# Patient Record
Sex: Male | Born: 1945
Health system: Southern US, Community
[De-identification: ages and names within clinical notes are randomized; demographics above are authoritative.]

## PROBLEM LIST (undated history)

## (undated) DIAGNOSIS — E43 Unspecified severe protein-calorie malnutrition: Secondary | ICD-10-CM

## (undated) DIAGNOSIS — E785 Hyperlipidemia, unspecified: Secondary | ICD-10-CM

## (undated) DIAGNOSIS — Z79899 Other long term (current) drug therapy: Secondary | ICD-10-CM

## (undated) DIAGNOSIS — E559 Vitamin D deficiency, unspecified: Secondary | ICD-10-CM

## (undated) DIAGNOSIS — M810 Age-related osteoporosis without current pathological fracture: Secondary | ICD-10-CM

## (undated) DIAGNOSIS — J9601 Acute respiratory failure with hypoxia: Secondary | ICD-10-CM

## (undated) DIAGNOSIS — K219 Gastro-esophageal reflux disease without esophagitis: Secondary | ICD-10-CM

## (undated) DIAGNOSIS — N179 Acute kidney failure, unspecified: Secondary | ICD-10-CM

## (undated) DIAGNOSIS — J9600 Acute respiratory failure, unspecified whether with hypoxia or hypercapnia: Secondary | ICD-10-CM

## (undated) DIAGNOSIS — I639 Cerebral infarction, unspecified: Secondary | ICD-10-CM

## (undated) DIAGNOSIS — I671 Cerebral aneurysm, nonruptured: Secondary | ICD-10-CM

## (undated) DIAGNOSIS — J439 Emphysema, unspecified: Secondary | ICD-10-CM

## (undated) DIAGNOSIS — I10 Essential (primary) hypertension: Secondary | ICD-10-CM

## (undated) DIAGNOSIS — I609 Nontraumatic subarachnoid hemorrhage, unspecified: Secondary | ICD-10-CM

## (undated) DIAGNOSIS — Z227 Latent tuberculosis: Secondary | ICD-10-CM

## (undated) DIAGNOSIS — K602 Anal fissure, unspecified: Secondary | ICD-10-CM

## (undated) DIAGNOSIS — A0472 Enterocolitis due to Clostridium difficile, not specified as recurrent: Secondary | ICD-10-CM

## (undated) DIAGNOSIS — E538 Deficiency of other specified B group vitamins: Secondary | ICD-10-CM

## (undated) DIAGNOSIS — N183 Chronic kidney disease, stage 3 (moderate): Secondary | ICD-10-CM

## (undated) DIAGNOSIS — J9819 Other pulmonary collapse: Secondary | ICD-10-CM

## (undated) DIAGNOSIS — D649 Anemia, unspecified: Secondary | ICD-10-CM

## (undated) DIAGNOSIS — K5190 Ulcerative colitis, unspecified, without complications: Secondary | ICD-10-CM

## (undated) HISTORY — DX: Acute kidney failure, unspecified: N17.9

## (undated) HISTORY — DX: Acute respiratory failure with hypoxia: J96.01

## (undated) HISTORY — DX: Vitamin D deficiency, unspecified: E55.9

## (undated) HISTORY — DX: Hyperlipidemia, unspecified: E78.5

## (undated) HISTORY — DX: Enterocolitis due to Clostridium difficile, not specified as recurrent: A04.72

## (undated) HISTORY — DX: Cerebral aneurysm, nonruptured: I67.1

## (undated) HISTORY — DX: Age-related osteoporosis without current pathological fracture: M81.0

## (undated) HISTORY — DX: Ulcerative colitis, unspecified, without complications: K51.90

## (undated) HISTORY — DX: Hypercalcemia: E83.52

## (undated) HISTORY — DX: Chronic kidney disease, stage 3 (moderate): N18.3

## (undated) HISTORY — DX: Anemia, unspecified: D64.9

## (undated) HISTORY — DX: Deficiency of other specified B group vitamins: E53.8

## (undated) HISTORY — DX: Gastro-esophageal reflux disease without esophagitis: K21.9

## (undated) HISTORY — DX: Other long term (current) drug therapy: Z79.899

## (undated) HISTORY — DX: Unspecified severe protein-calorie malnutrition: E43

## (undated) HISTORY — DX: Latent tuberculosis: Z22.7

## (undated) HISTORY — DX: Other pulmonary collapse: J98.19

## (undated) HISTORY — DX: Anal fissure, unspecified: K60.2

## (undated) HISTORY — DX: Acute respiratory failure, unspecified whether with hypoxia or hypercapnia: J96.00

## (undated) HISTORY — PX: CATARACT EXTRACTION: SUR2

## (undated) HISTORY — DX: Nontraumatic subarachnoid hemorrhage, unspecified: I60.9

## (undated) HISTORY — DX: Essential (primary) hypertension: I10

---

## 2011-06-19 HISTORY — PX: ANEURYSM COILING: SHX5349

## 2011-09-17 ENCOUNTER — Emergency Department (HOSPITAL_COMMUNITY): Payer: Managed Care, Other (non HMO)

## 2011-09-17 ENCOUNTER — Encounter (HOSPITAL_COMMUNITY)
Admission: EM | Disposition: A | Discharge status: Home / Self Care | Payer: Self-pay | Source: Ambulatory Visit | Attending: Neurosurgery

## 2011-09-17 ENCOUNTER — Encounter (HOSPITAL_COMMUNITY): Payer: Self-pay | Admitting: Anesthesiology

## 2011-09-17 ENCOUNTER — Inpatient Hospital Stay (HOSPITAL_COMMUNITY): Payer: Managed Care, Other (non HMO)

## 2011-09-17 ENCOUNTER — Inpatient Hospital Stay (HOSPITAL_COMMUNITY)
Admission: EM | Admit: 2011-09-17 | Discharge: 2011-10-04 | DRG: 020 | Disposition: A | Discharge status: Home / Self Care | Payer: Managed Care, Other (non HMO) | Source: Ambulatory Visit | Attending: Neurosurgery | Admitting: Neurosurgery

## 2011-09-17 ENCOUNTER — Emergency Department (HOSPITAL_COMMUNITY): Payer: Managed Care, Other (non HMO) | Admitting: Anesthesiology

## 2011-09-17 ENCOUNTER — Encounter (HOSPITAL_COMMUNITY): Payer: Self-pay | Admitting: Emergency Medicine

## 2011-09-17 ENCOUNTER — Other Ambulatory Visit: Payer: Self-pay

## 2011-09-17 DIAGNOSIS — J9819 Other pulmonary collapse: Secondary | ICD-10-CM

## 2011-09-17 DIAGNOSIS — I498 Other specified cardiac arrhythmias: Secondary | ICD-10-CM | POA: Diagnosis present

## 2011-09-17 DIAGNOSIS — G911 Obstructive hydrocephalus: Secondary | ICD-10-CM | POA: Diagnosis present

## 2011-09-17 DIAGNOSIS — I671 Cerebral aneurysm, nonruptured: Secondary | ICD-10-CM | POA: Diagnosis present

## 2011-09-17 DIAGNOSIS — J9600 Acute respiratory failure, unspecified whether with hypoxia or hypercapnia: Secondary | ICD-10-CM

## 2011-09-17 DIAGNOSIS — J9601 Acute respiratory failure with hypoxia: Secondary | ICD-10-CM | POA: Diagnosis present

## 2011-09-17 DIAGNOSIS — F17200 Nicotine dependence, unspecified, uncomplicated: Secondary | ICD-10-CM | POA: Diagnosis present

## 2011-09-17 DIAGNOSIS — J690 Pneumonitis due to inhalation of food and vomit: Secondary | ICD-10-CM | POA: Diagnosis not present

## 2011-09-17 DIAGNOSIS — R4182 Altered mental status, unspecified: Secondary | ICD-10-CM

## 2011-09-17 DIAGNOSIS — I609 Nontraumatic subarachnoid hemorrhage, unspecified: Principal | ICD-10-CM | POA: Diagnosis present

## 2011-09-17 DIAGNOSIS — I959 Hypotension, unspecified: Secondary | ICD-10-CM | POA: Diagnosis not present

## 2011-09-17 DIAGNOSIS — J90 Pleural effusion, not elsewhere classified: Secondary | ICD-10-CM | POA: Diagnosis not present

## 2011-09-17 HISTORY — DX: Cerebral aneurysm, nonruptured: I67.1

## 2011-09-17 HISTORY — DX: Acute respiratory failure with hypoxia: J96.01

## 2011-09-17 LAB — DIFFERENTIAL
Basophils Relative: 1 % (ref 0–1)
Monocytes Absolute: 0.7 10*3/uL (ref 0.1–1.0)
Monocytes Relative: 6 % (ref 3–12)
Neutro Abs: 7.7 10*3/uL (ref 1.7–7.7)

## 2011-09-17 LAB — BLOOD GAS, ARTERIAL
Bicarbonate: 20.9 mEq/L (ref 20.0–24.0)
Drawn by: 249101
O2 Saturation: 86.8 %
PEEP: 5 cmH2O
RATE: 16 resp/min
pCO2 arterial: 41.6 mmHg (ref 35.0–45.0)
pO2, Arterial: 52.3 mmHg — ABNORMAL LOW (ref 80.0–100.0)

## 2011-09-17 LAB — CBC
HCT: 38 % — ABNORMAL LOW (ref 39.0–52.0)
Hemoglobin: 12.9 g/dL — ABNORMAL LOW (ref 13.0–17.0)
MCHC: 33.9 g/dL (ref 30.0–36.0)
RBC: 4.21 MIL/uL — ABNORMAL LOW (ref 4.22–5.81)

## 2011-09-17 LAB — TROPONIN I: Troponin I: <0.3 ng/mL (ref <0.30)

## 2011-09-17 LAB — COMPREHENSIVE METABOLIC PANEL
Albumin: 3.6 g/dL (ref 3.5–5.2)
BUN: 20 mg/dL (ref 6–23)
Chloride: 106 mEq/L (ref 96–112)
Creatinine, Ser: 0.9 mg/dL (ref 0.50–1.35)
GFR calc Af Amer: >90 mL/min (ref >90)
GFR calc non Af Amer: 87 mL/min — ABNORMAL LOW (ref >90)
Total Bilirubin: 0.3 mg/dL (ref 0.3–1.2)

## 2011-09-17 LAB — PROTIME-INR
INR: 0.97 (ref 0.00–1.49)
Prothrombin Time: 13.1 seconds (ref 11.6–15.2)

## 2011-09-17 LAB — MRSA PCR SCREENING: MRSA by PCR: NEGATIVE

## 2011-09-17 SURGERY — RADIOLOGY WITH ANESTHESIA
Anesthesia: General

## 2011-09-17 MED ORDER — HEPARIN SODIUM (PORCINE) 1000 UNIT/ML IJ SOLN
INTRAMUSCULAR | Status: DC | PRN
  Administered 2011-09-17: 500 [IU] via INTRAVENOUS

## 2011-09-17 MED ORDER — ONDANSETRON HCL 4 MG/2ML IJ SOLN
4.0000 mg | Freq: Once | INTRAMUSCULAR | Status: AC
  Administered 2011-09-17: 4 mg via INTRAVENOUS
  Filled 2011-09-17: qty 2

## 2011-09-17 MED ORDER — PROPOFOL 10 MG/ML IV EMUL: 5.0000 ug/kg/min | INTRAVENOUS | Status: DC

## 2011-09-17 MED ORDER — LIDOCAINE HCL (PF) 1 % IJ SOLN
INTRAMUSCULAR | Status: AC
  Filled 2011-09-17: qty 10

## 2011-09-17 MED ORDER — MORPHINE SULFATE 2 MG/ML IJ SOLN
1.0000 mg | INTRAMUSCULAR | Status: DC | PRN
  Administered 2011-09-18 – 2011-09-27 (×8): 2 mg via INTRAVENOUS
  Filled 2011-09-17 (×9): qty 1

## 2011-09-17 MED ORDER — LORAZEPAM 2 MG/ML IJ SOLN: 1.0000 mg | Freq: Once | INTRAMUSCULAR | Status: AC | PRN

## 2011-09-17 MED ORDER — HYDROMORPHONE HCL PF 1 MG/ML IJ SOLN
0.5000 mg | Freq: Once | INTRAMUSCULAR | Status: AC
  Administered 2011-09-17: 0.5 mg via INTRAVENOUS
  Filled 2011-09-17: qty 1

## 2011-09-17 MED ORDER — PROPOFOL 10 MG/ML IV EMUL
INTRAVENOUS | Status: DC | PRN
  Administered 2011-09-17: 150 mg via INTRAVENOUS

## 2011-09-17 MED ORDER — FENTANYL CITRATE 0.05 MG/ML IJ SOLN
INTRAMUSCULAR | Status: DC | PRN
  Administered 2011-09-17: 100 ug via INTRAVENOUS
  Administered 2011-09-17: 50 ug via INTRAVENOUS

## 2011-09-17 MED ORDER — ACETAMINOPHEN 650 MG RE SUPP: 650.0000 mg | RECTAL | Status: DC | PRN

## 2011-09-17 MED ORDER — NITROGLYCERIN 5 MG/ML IV SOLN
1.5000 mg | INTRAVENOUS | Status: DC
  Filled 2011-09-17: qty 0.3

## 2011-09-17 MED ORDER — FENTANYL CITRATE 0.05 MG/ML IJ SOLN
INTRAMUSCULAR | Status: AC | PRN
  Administered 2011-09-17: 12.5 ug via INTRAVENOUS
  Administered 2011-09-17 (×2): 25 ug via INTRAVENOUS

## 2011-09-17 MED ORDER — FENTANYL CITRATE 0.05 MG/ML IJ SOLN: 50.0000 ug | INTRAMUSCULAR | Status: DC | PRN

## 2011-09-17 MED ORDER — CEFAZOLIN SODIUM 1-5 GM-% IV SOLN
INTRAVENOUS | Status: DC | PRN
  Administered 2011-09-17: 1 g via INTRAVENOUS

## 2011-09-17 MED ORDER — ROCURONIUM BROMIDE 100 MG/10ML IV SOLN
INTRAVENOUS | Status: DC | PRN
  Administered 2011-09-17: 30 mg via INTRAVENOUS
  Administered 2011-09-17: 50 mg via INTRAVENOUS

## 2011-09-17 MED ORDER — HYDRALAZINE HCL 20 MG/ML IJ SOLN
INTRAMUSCULAR | Status: AC
  Administered 2011-09-17 (×2): 5 mg via INTRAVENOUS
  Filled 2011-09-17: qty 2

## 2011-09-17 MED ORDER — ACETAMINOPHEN 650 MG RE SUPP: 650.0000 mg | Freq: Four times a day (QID) | RECTAL | Status: DC | PRN

## 2011-09-17 MED ORDER — MIDAZOLAM HCL 5 MG/5ML IJ SOLN
INTRAMUSCULAR | Status: AC | PRN
  Administered 2011-09-17: 1 mg via INTRAVENOUS

## 2011-09-17 MED ORDER — SODIUM CHLORIDE 0.9 % IV SOLN
INTRAVENOUS | Status: DC | PRN
  Administered 2011-09-17 (×2): via INTRAVENOUS

## 2011-09-17 MED ORDER — SODIUM CHLORIDE 0.9 % IV SOLN
INTRAVENOUS | Status: DC
  Administered 2011-09-17: 999 mL via INTRAVENOUS
  Administered 2011-09-18 – 2011-10-02 (×24): via INTRAVENOUS

## 2011-09-17 MED ORDER — CEFAZOLIN SODIUM 1-5 GM-% IV SOLN
INTRAVENOUS | Status: AC
  Filled 2011-09-17: qty 50

## 2011-09-17 MED ORDER — SODIUM CHLORIDE 0.9 % IV BOLUS (SEPSIS)
750.0000 mL | Freq: Once | INTRAVENOUS | Status: AC
  Administered 2011-09-17: 750 mL via INTRAVENOUS

## 2011-09-17 MED ORDER — HYDRALAZINE HCL 20 MG/ML IJ SOLN: 5.0000 mg | INTRAMUSCULAR | Status: DC | PRN

## 2011-09-17 MED ORDER — PHENYLEPHRINE HCL 10 MG/ML IJ SOLN
INTRAMUSCULAR | Status: DC | PRN
  Administered 2011-09-17 (×4): 40 ug via INTRAVENOUS

## 2011-09-17 MED ORDER — IOHEXOL 300 MG/ML  SOLN
300.0000 mL | Freq: Once | INTRAMUSCULAR | Status: AC | PRN
  Administered 2011-09-17: 130 mL via INTRAVENOUS

## 2011-09-17 MED ORDER — DEXAMETHASONE SODIUM PHOSPHATE 10 MG/ML IJ SOLN
10.0000 mg | Freq: Once | INTRAMUSCULAR | Status: AC
  Administered 2011-09-17: 10 mg via INTRAVENOUS
  Filled 2011-09-17: qty 1

## 2011-09-17 MED ORDER — HYDRALAZINE HCL 20 MG/ML IJ SOLN
5.0000 mg | INTRAMUSCULAR | Status: DC | PRN
  Administered 2011-09-19: 5 mg via INTRAVENOUS
  Administered 2011-09-22: 10 mg via INTRAVENOUS
  Administered 2011-09-23 – 2011-09-28 (×3): 5 mg via INTRAVENOUS
  Administered 2011-09-28: 10 mg via INTRAVENOUS
  Filled 2011-09-17 (×6): qty 1

## 2011-09-17 MED ORDER — FAMOTIDINE IN NACL 20-0.9 MG/50ML-% IV SOLN
20.0000 mg | Freq: Once | INTRAVENOUS | Status: AC
  Administered 2011-09-17: 20 mg via INTRAVENOUS
  Filled 2011-09-17: qty 50

## 2011-09-17 MED ORDER — NICARDIPINE HCL IN NACL 20-0.86 MG/200ML-% IV SOLN
5.0000 mg/h | INTRAVENOUS | Status: DC
  Filled 2011-09-17: qty 200

## 2011-09-17 MED ORDER — ONDANSETRON HCL 4 MG/2ML IJ SOLN
4.0000 mg | Freq: Four times a day (QID) | INTRAMUSCULAR | Status: DC | PRN
  Administered 2011-09-28: 4 mg via INTRAVENOUS
  Filled 2011-09-17: qty 2

## 2011-09-17 MED ORDER — HYDROXYZINE HCL 50 MG/ML IM SOLN
50.0000 mg | INTRAMUSCULAR | Status: DC | PRN
  Filled 2011-09-17: qty 1

## 2011-09-17 MED ORDER — NIMODIPINE 30 MG/ML ORAL SOLUTION
60.0000 mg | ORAL | Status: DC
  Administered 2011-09-17 – 2011-09-18 (×4): 60 mg
  Filled 2011-09-17 (×20): qty 2

## 2011-09-17 MED ORDER — SODIUM CHLORIDE 0.9 % IJ SOLN
1.5000 mg | INTRAVENOUS | Status: DC
  Filled 2011-09-17: qty 0.3

## 2011-09-17 MED ORDER — HEPARIN SODIUM (PORCINE) 1000 UNIT/ML IJ SOLN
INTRAMUSCULAR | Status: AC
  Filled 2011-09-17: qty 1

## 2011-09-17 MED ORDER — SODIUM CHLORIDE 0.9 % IV SOLN: INTRAVENOUS | Status: DC

## 2011-09-17 MED ORDER — NIMODIPINE 30 MG PO CAPS
60.0000 mg | ORAL_CAPSULE | ORAL | Status: DC
  Administered 2011-09-18 – 2011-09-19 (×7): 60 mg via ORAL
  Filled 2011-09-17 (×23): qty 2

## 2011-09-17 MED ORDER — PROPOFOL 10 MG/ML IV EMUL
INTRAVENOUS | Status: DC | PRN
  Administered 2011-09-17: 25 ug/kg/min via INTRAVENOUS

## 2011-09-17 MED ORDER — ACETAMINOPHEN 325 MG PO TABS
650.0000 mg | ORAL_TABLET | ORAL | Status: DC | PRN
  Administered 2011-09-18 – 2011-09-28 (×9): 650 mg via ORAL
  Filled 2011-09-17 (×9): qty 2

## 2011-09-17 MED ORDER — MIDAZOLAM HCL 2 MG/2ML IJ SOLN: 1.0000 mg | INTRAMUSCULAR | Status: DC | PRN

## 2011-09-17 MED ORDER — SENNOSIDES-DOCUSATE SODIUM 8.6-50 MG PO TABS
1.0000 | ORAL_TABLET | Freq: Two times a day (BID) | ORAL | Status: DC
  Administered 2011-09-17 – 2011-10-03 (×27): 1 via ORAL
  Filled 2011-09-17 (×35): qty 1

## 2011-09-17 MED ORDER — HYDROMORPHONE HCL PF 1 MG/ML IJ SOLN
0.2500 mg | INTRAMUSCULAR | Status: DC | PRN
  Administered 2011-09-18: 0.5 mg via INTRAVENOUS
  Filled 2011-09-17: qty 1

## 2011-09-17 MED ORDER — LIDOCAINE HCL (CARDIAC) 20 MG/ML IV SOLN
INTRAVENOUS | Status: DC | PRN
  Administered 2011-09-17: 100 mg via INTRAVENOUS

## 2011-09-17 MED ORDER — EPHEDRINE SULFATE 50 MG/ML IJ SOLN
INTRAMUSCULAR | Status: DC | PRN
  Administered 2011-09-17: 5 mg via INTRAVENOUS

## 2011-09-17 MED ORDER — ACETAMINOPHEN 500 MG PO TABS: 1000.0000 mg | ORAL_TABLET | Freq: Four times a day (QID) | ORAL | Status: DC | PRN

## 2011-09-17 MED ORDER — HYDROMORPHONE HCL PF 1 MG/ML IJ SOLN
INTRAMUSCULAR | Status: AC
  Filled 2011-09-17: qty 1

## 2011-09-17 MED ORDER — HYDROMORPHONE HCL PF 1 MG/ML IJ SOLN
0.5000 mg | Freq: Once | INTRAMUSCULAR | Status: AC
  Administered 2011-09-17: 0.5 mg via INTRAVENOUS

## 2011-09-17 MED ORDER — PROPOFOL 10 MG/ML IV EMUL
5.0000 ug/kg/min | INTRAVENOUS | Status: DC
  Administered 2011-09-17: 25 ug/kg/min via INTRAVENOUS
  Administered 2011-09-17 – 2011-09-18 (×2): 40.868 ug/kg/min via INTRAVENOUS
  Filled 2011-09-17 (×3): qty 100

## 2011-09-17 MED ORDER — LABETALOL HCL 5 MG/ML IV SOLN
5.0000 mg | INTRAVENOUS | Status: DC | PRN
  Administered 2011-09-18: 20 mg via INTRAVENOUS
  Administered 2011-09-21: 5 mg via INTRAVENOUS
  Administered 2011-09-22 – 2011-09-24 (×2): 20 mg via INTRAVENOUS
  Filled 2011-09-17 (×4): qty 4

## 2011-09-17 MED ORDER — SENNOSIDES-DOCUSATE SODIUM 8.6-50 MG PO TABS
1.0000 | ORAL_TABLET | Freq: Once | ORAL | Status: DC
  Filled 2011-09-17: qty 1

## 2011-09-17 MED ORDER — PANTOPRAZOLE SODIUM 40 MG IV SOLR
40.0000 mg | Freq: Every day | INTRAVENOUS | Status: DC
  Administered 2011-09-17 – 2011-09-20 (×4): 40 mg via INTRAVENOUS
  Filled 2011-09-17 (×5): qty 40

## 2011-09-17 MED ORDER — FENTANYL CITRATE 0.05 MG/ML IJ SOLN
INTRAMUSCULAR | Status: AC
  Administered 2011-09-17: 100 ug via INTRAVENOUS
  Filled 2011-09-17: qty 4

## 2011-09-17 NOTE — Op Note (Signed)
09/17/2011  5:30 PM  PATIENT:  Sean Pope  66 y.o. male  PRE-OPERATIVE DIAGNOSIS:  . Hydrocephalus, aneurysmal subarachnoid hemorrhage  POST-OPERATIVE DIAGNOSIS: Hydrocephalus, aneurysmal subarachnoid hemorrhage  PROCEDURE:  Procedure(s): Placement of right frontal intraventricular catheter  SURGEON: Hosie Spangle M.D.  ANESTHESIA:   local, IV sedation  DICTATION: At the patient's bedside in the neurosurgical intensive care unit the patient was prepared for placement of a right frontal intraventricular catheter. The right frontal scalp was shaved and prepped with Betadine solution, and draped in a sterile fashion. The scalp was infiltrated with 1% Xylocaine local anesthetic. A 4 mm incision was made in the right mid pupillary line adjacent to the coronal suture. A twist drill hole was made in the right mid pupillary line, just anterior to the coronal suture. The dura was incised and then a ventricular catheter was passed into the right frontal horn. Bloody CSF drained under moderate pressure. The catheter was then tunneled and brought out through a separate stab incision. The catheter was secured down with 3-0 nylon suture at 3 separate points, including the exit point. In the primary incision was closed with interrupted 3-0 nylon suture. The ventricular catheter was then connected to a closed collection system, which is to be set to drain it 10 cm water. The system was dressed with OpSite. Procedure was tolerated well, and the blood loss was minimal.  Delay start of Pharmacological VTE agent (>24hrs) due to surgical blood loss or risk of bleeding:  yes

## 2011-09-17 NOTE — H&P (Signed)
Subjective: Patient is a 66 y.o. male who is admitted for treatment of subarachnoid hemorrhage, probably aneurysmal. Patient explains that he was in his usual state of good health, walk his dog this morning as usual, and went to the bathroom. While using the bathroom she had the sudden onset of headache and then is unaware of what happened subsequently, but clearly passed out striking his forehead on objects around the bathroom. His dogs became agitated, thereby notifying his wife, and she came to find him collapse on the bathroom floor.  EMS was called and the patient was transferred to the [facility] emergency room and evaluated and treated by Dr. Carmin Muskrat the emergency room physician. CT of the head was obtained which revealed a diffuse subarachnoid hemorrhage, with a small amount of intraventricular hemorrhage in the third ventricles and occipital horns, with early dilation of the temporal horns. Neurosurgical consultation was requested.  Symptomatically the patient continues to complain of headache, he did have some nausea but typically during transport, but has had no vomiting.  He also notes a history that 3 days ago he did develop a mild headache with some posterior neck pain and lightheadedness.  Past medical history: Patient denies any history of hypertension, myocardial infarction, diabetes, previous stroke, ulcers, lung disease, or cancer.  Past surgical history: None  No known allergies  No meds  Family history: Mother died at age 102 of old age, father left a home and the patient was age 27 and he has no medical information regarding his father but suspects that he is past.  Social history: Patient is married, he works as a Nurse, learning disability. He smokes half a pack per day, and a smoked for 50 years. He drinks alcoholic beverages very rarely.  Review of Systems A comprehensive review of systems was negative.  Objective: Vital signs in last 24 hours: Temp:  [97.7 F  (36.5 C)] 97.7 F (36.5 C) (04/01 0708) Pulse Rate:  [56-63] 56  (04/01 0945) Resp:  [16] 16  (04/01 0836) BP: (160-183)/(73-112) 161/83 mmHg (04/01 0945) SpO2:  [96 %-100 %] 96 % (04/01 0945)  EXAM: Patient is a well-developed well-nourished white male in no acute distress. Lungs are clear to auscultation , the patient has symmetrical respiratory excursion. Heart has a regular rate and rhythm normal S1 and S2 no murmur.   Abdomen is soft nontender nondistended bowel sounds are present. Extremity examination shows no clubbing cyanosis or edema. Neck shows 3+ meningismus. Mental status post patient is awake and alert, oriented to his name, hospital, and 2013. He follows commands, and his speech is fluent. Cranial nerves show pupils are equal round and reactive to light, and approximately 3 mm bilaterally. Extra ocular movements are intact. Facial movement is symmetrical. Hearing is present bilaterally. Shoulder shrug is symmetrical. Palatal movement is symmetrical. Tongue is midline. Motor examination shows 5 over 5 strength in the upper and lower extremities. There is no drift of the upper extremities. Sensation is intact to pinprick. Reflexes are trace in the upper and lower extremities. Toes are downgoing bilaterally. Gait and stance are not tested due to the nature of his condition.  Data Review:CBC    Component Value Date/Time   WBC 11.8* 09/17/2011 0721   RBC 4.21* 09/17/2011 0721   HGB 12.9* 09/17/2011 0721   HCT 38.0* 09/17/2011 0721   PLT 292 09/17/2011 0721   MCV 90.3 09/17/2011 0721   MCH 30.6 09/17/2011 0721   MCHC 33.9 09/17/2011 0721   RDW 13.7  09/17/2011 0721   LYMPHSABS 2.9 09/17/2011 0721   MONOABS 0.7 09/17/2011 0721   EOSABS 0.3 09/17/2011 0721   BASOSABS 0.1 09/17/2011 0721                          BMET    Component Value Date/Time   NA 139 09/17/2011 0721   K 4.2 09/17/2011 0721   CL 106 09/17/2011 0721   CO2 24 09/17/2011 0721   GLUCOSE 167* 09/17/2011 0721   BUN 20 09/17/2011 0721   CREATININE  0.90 09/17/2011 0721   CALCIUM 9.3 09/17/2011 0721   GFRNONAA 87* 09/17/2011 0721   GFRAA >90 09/17/2011 4142     Assessment/Plan: 66 year old man with a negative past medical history other than smoking, who presents with syncope, headache, and nausea and has been found by CT to have diffuse subarachnoid hemorrhage,probably aneurysmal. Patient is grade 2 Hunt and Hess. I've spoken at length with the patient, his wife, his daughter, and his son, about his condition, and her recommendations for treatment and care. I've discussed with them the risk of re-hemorrhage, hydrocephalus, and vasospasm. I've recommended cerebral angiography and if feasible endovascular coiling if an aneurysm is found. I've also discussed with them the possible need for craniotomy and clipping of aneurysm, the possible need for placement of an intraventricular catheter since the CT does show early enlargement of the temporal horns, and the plan for treatment with Nimotop and monitoring with the TCDs. I've explained to them the significant risks of ruptured cerebral aneurysms and aneurysmal subarachnoid hemorrhage.  Hosie Spangle, MD 09/17/2011 11:28 AM

## 2011-09-17 NOTE — Anesthesia Postprocedure Evaluation (Signed)
  Anesthesia Post-op Note  Patient: Sean Pope  Procedure(s) Performed: Procedure(s) (LRB): RADIOLOGY WITH ANESTHESIA (N/A)  Patient Location: PACU and NICU  Anesthesia Type: General  Level of Consciousness: sedated and Patient remains intubated per anesthesia plan  Airway and Oxygen Therapy: Patient remains intubated per anesthesia plan and Patient placed on Ventilator (see vital sign flow sheet for setting)  Post-op Pain: none  Post-op Assessment: Post-op Vital signs reviewed, Patient's Cardiovascular Status Stable, Respiratory Function Stable, Patent Airway and No signs of Nausea or vomiting  Post-op Vital Signs: Reviewed and stable  Complications: No apparent anesthesia complications

## 2011-09-17 NOTE — ED Notes (Signed)
IV flushed and 1082m bolus running per admitting MD

## 2011-09-17 NOTE — Procedures (Signed)
S/P 4 vessel cerebral arterioogram RT CFA approach . [place]  Findings . 1. 4.56m x 2.045mRT PCOM aneurysm  Endovascularly coiled with obliteration.

## 2011-09-17 NOTE — Anesthesia Preprocedure Evaluation (Addendum)
Anesthesia Evaluation  Patient identified by MRN, date of birth, ID band Patient confused    Reviewed: Allergy & Precautions, H&P , NPO status , Patient's Chart, lab work & pertinent test results  Airway Mallampati: II TM Distance: >3 FB Neck ROM: Full    Dental   Pulmonary    Pulmonary exam normal       Cardiovascular     Neuro/Psych  Headaches,    GI/Hepatic   Endo/Other    Renal/GU      Musculoskeletal   Abdominal   Peds  Hematology   Anesthesia Other Findings sedated  Reproductive/Obstetrics                          Anesthesia Physical Anesthesia Plan  ASA: III and Emergent  Anesthesia Plan: General   Post-op Pain Management:    Induction: Intravenous  Airway Management Planned: Oral ETT  Additional Equipment: Arterial line  Intra-op Plan:   Post-operative Plan: Extubation in OR  Informed Consent: I have reviewed the patients History and Physical, chart, labs and discussed the procedure including the risks, benefits and alternatives for the proposed anesthesia with the patient or authorized representative who has indicated his/her understanding and acceptance.     Plan Discussed with: CRNA and Surgeon  Anesthesia Plan Comments:         Anesthesia Quick Evaluation

## 2011-09-17 NOTE — ED Notes (Signed)
From home via EMS.  Per wife she found pt curled up in front of the toilet with an an abrasion to forehead and agonal breathing.  Wife called EMS and was instructed to start compressions.  60-70 compressions and pt began to wake as first responders arrived.  Pt alert to person and place, but repeating that he does not remember what happened and that he has a headache 6/10.  Pt has incomplete memory prior to incident.  Pt reports no allergies and no significant medical history.  Pt has not has Tdap in many many years.  Abrasion to rt forehead and 1 inch skin tear to rt wrist.

## 2011-09-17 NOTE — Transfer of Care (Signed)
Immediate Anesthesia Transfer of Care Note  Patient: Sean Pope  Procedure(s) Performed: Procedure(s) (LRB): RADIOLOGY WITH ANESTHESIA (N/A)  Patient Location: PACU and NICU  Anesthesia Type: General  Level of Consciousness: Patient remains intubated per anesthesia plan  Airway & Oxygen Therapy: Patient remains intubated per anesthesia plan  Post-op Assessment: Report given to PACU RN  Post vital signs: Reviewed and stable  Complications: No apparent anesthesia complications

## 2011-09-17 NOTE — Consult Note (Signed)
Name: Sean Pope MRN: [id number] DOB: [birth year]    LOS: 0  PCCM ADMISSION NOTE  History of Present Illness: This is a 66 year old white male who presents with a right P-comm aneurysm with spontaneous rupture. The patient has a negative past history the exception of active smoking. After coming home from walking his dog this morning he developed acute onset headaches and nausea. Patient brought to the emergency room found to have a subarachnoid hemorrhage grade 2. Patient was taken to cerebral angiography and found to have a right P-comm aneurysm rupture. This was coiled by neuroradiology.  Postop the patient has been left on mechanical vent support and brought back to the intensive care. An intraventricular drain has been placed on neurosurgery. There was dilation of the fourth ventricle seen on CT scan. Critical care medicine was asked to help with support this patient.  Lines / Drains: IVC drain 09/17/11>>> ETT 09/17/11>>>  Cultures: none  Antibiotics: none  Tests / Events:   The patient is sedated, intubated and unable to provide history, which was obtained for available medical records.    History reviewed. No pertinent past medical history. History reviewed. No pertinent past surgical history. Prior to Admission medications   Medication Sig Start Date End Date Taking? Authorizing Provider  Multiple Vitamin (MULITIVITAMIN WITH MINERALS) TABS Take 1 tablet by mouth daily.   Yes Historical Provider, MD   Allergies No Known Allergies  Family History History reviewed. No pertinent family history.  Social History Pt with positive smoking history    He does not have any smokeless tobacco history on file. His alcohol and drug histories not on file.  Review Of Systems  11 points review of systems is negative with an exception of listed in HPI.  Vital Signs: Temp:  [97.4 F (36.3 C)-97.7 F (36.5 C)] 97.4 F (36.3 C) (04/01 1600) Pulse Rate:  [56-68] 65  (04/01 1336) Resp:   [13-17] 13  (04/01 1340) BP: (150-186)/(72-112) 150/75 mmHg (04/01 1340) SpO2:  [96 %-100 %] 99 % (04/01 1340) FiO2 (%):  [50 %] 50 % (04/01 1550) Weight:  [78.3 kg (172 lb 9.9 oz)] 78.3 kg (172 lb 9.9 oz) (04/01 1550)    Physical Examination: General:  Patient in no distress sedated on ventilator Neuro:  Sedated at this time on mechanical ventilation  , right intraventricular drain placed HEENT:  Endotracheal tube in place, no other overt lesions seen Neck:  Neck supple no thyromegaly   Cardiovascular:  Regular rate and rhythm without S3 normal S1-S2 Lungs:  Clear without wheeze or rhonchi Abdomen:  Soft nontender bowel sounds hypoactive no masses Musculoskeletal:  Full range of motion Skin:  Clear  Ventilator settings: Vent Mode:  [-] PRVC FiO2 (%):  [50 %] 50 % Set Rate:  [16 bmp] 16 bmp Vt Set:  [600 mL] 600 mL PEEP:  [5.6 cmH20] 5.6 cmH20 Plateau Pressure:  [17 cmH20] 17 cmH20  Labs and Imaging:  Reviewed.  Please refer to the Assessment and Plan section for relevant results.  Lab 09/17/11 0721  NA 139  K 4.2  CL 106  CO2 24  GLUCOSE 167*  BUN 20  CREATININE 0.90  CALCIUM 9.3  MG --  PHOS --    Lab 09/17/11 0721  HGB 12.9*  HCT 38.0*  WBC 11.8*  PLT 292    Lab 09/17/11 1315 09/17/11 0721  AST -- 18  ALT -- 15  ALKPHOS -- 80  BILITOT -- 0.3  PROT -- 6.4  ALBUMIN -- 3.6  INR 0.97 --   Dg Chest 2 View  09/17/2011  *RADIOLOGY REPORT*  Clinical Data: [place] hitting head.  No chest complaints.  Slight cough.  Congestion.  CHEST - 2 VIEW  Comparison: None.  Findings: No infiltrate, congestive heart failure or pneumothorax.  Cardiac leads overlie the slightly limiting evaluation without gross abnormality noted.  Mildly tortuous ascending thoracic aorta.  Peribronchial thickening may represent changes of chronic bronchitis.  Heart size within normal limits.  IMPRESSION: Minimal peribronchial thickening may represent chronic bronchitis type changes.  Slightly  tortuous ascending thoracic aorta.  Original Report Authenticated By: Doug Sou, M.D.   Ct Head Wo Contrast  09/17/2011  *RADIOLOGY REPORT*  Clinical Data: Aneurysm coiling today.  CT HEAD WITHOUT CONTRAST  Technique:  Contiguous axial images were obtained from the base of the skull through the vertex without contrast.  Comparison: CT 09/17/2011  Findings: There is diffuse subarachnoid hemorrhage in the basilar cisterns and sylvian fissure which is similar in degree.  Increased blood layering in the occipital horns bilaterally compared with the prior study.  There is developing hydrocephalus with enlargement of the ventricles since earlier today.  Hypodensity left cerebellum appears chronic.  No acute infarct or mass.  Aneurysm coil present in the region of the right posterior communicating artery.  IMPRESSION: Diffuse subarachnoid hemorrhage again noted.  Progression of blood in the ventricles and progression of hydrocephalus since earlier today.  Critical Value/emergent results were called by telephone at the time of interpretation on   09/17/2011  at 1545 hours  to  Dr. Estanislado Pandy, who verbally acknowledged these results.  Original Report Authenticated By: Truett Perna, M.D.   Ct Head Wo Contrast  09/17/2011  *RADIOLOGY REPORT*  Clinical Data:  Mental status changes while on toilet.  CT HEAD WITHOUT CONTRAST CT CERVICAL SPINE WITHOUT CONTRAST  Technique:  Multidetector CT imaging of the head and cervical spine was performed following the standard protocol without intravenous contrast.  Multiplanar CT image reconstructions of the cervical spine were also generated.  Comparison:   None  CT HEAD  Findings: Diffuse subarachnoid hemorrhage asymmetric greater on the right.  Findings most suggestive of ruptured aneurysm possibly right middle cerebral artery or right posterior communicating artery.  Ventricles are slightly prominent in size which may represent early hydrocephalus.  Remote small left cerebellar  infarct.  Small vessel disease type changes. No intracranial mass lesion detected on this unenhanced exam.  Polypoid opacification right sphenoid sinus and maxillary sinuses.  IMPRESSION: Diffuse subarachnoid hemorrhage with suggestion of early hydrocephalus.  Please see above.  CT CERVICAL SPINE  Findings: No cervical spine fracture or malalignment.  Scattered mild cervical spondylotic changes.  IMPRESSION: No cervical spine fracture.  Critical Value/emergent results were called by telephone at the time of interpretation on 09/17/2011  at 7:54 a.m.  to  Dr. Vanita Panda, who verbally acknowledged these results.  Original Report Authenticated By: Doug Sou, M.D.   Ct Cervical Spine Wo Contrast  09/17/2011  *RADIOLOGY REPORT*  Clinical Data:  Mental status changes while on toilet.  CT HEAD WITHOUT CONTRAST CT CERVICAL SPINE WITHOUT CONTRAST  Technique:  Multidetector CT imaging of the head and cervical spine was performed following the standard protocol without intravenous contrast.  Multiplanar CT image reconstructions of the cervical spine were also generated.  Comparison:   None  CT HEAD  Findings: Diffuse subarachnoid hemorrhage asymmetric greater on the right.  Findings most suggestive of ruptured aneurysm possibly right middle cerebral artery or right posterior communicating  artery.  Ventricles are slightly prominent in size which may represent early hydrocephalus.  Remote small left cerebellar infarct.  Small vessel disease type changes. No intracranial mass lesion detected on this unenhanced exam.  Polypoid opacification right sphenoid sinus and maxillary sinuses.  IMPRESSION: Diffuse subarachnoid hemorrhage with suggestion of early hydrocephalus.  Please see above.  CT CERVICAL SPINE  Findings: No cervical spine fracture or malalignment.  Scattered mild cervical spondylotic changes.  IMPRESSION: No cervical spine fracture.  Critical Value/emergent results were called by telephone at the time of  interpretation on 09/17/2011  at 7:54 a.m.  to  Dr. Vanita Panda, who verbally acknowledged these results.  Original Report Authenticated By: Doug Sou, M.D.    Assessment and Plan: Patient Active Hospital Problem List: Subarachnoid hemorrhage (09/17/2011)   Assessment: Subarachnoid hemorrhage grade 2 due to rupture of right posterior to indicating aneurysm status post coiling    Plan: Per neurosurgery   Acute respiratory failure with hypoxia (09/17/2011)   Assessment: Respiratory failure with mild hypoxemia do to subarachnoid hemorrhage and altered consciousness and need for intubation for placement of coiling and need for ongoing sedation post coiling of aneurysm    Plan: Maintain on full vent support overnight and attempt spontaneous breathing trial on 09-18-11 .   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)   Assessment: Status post coiling per neuro radiology    Plan: Per neuroradiology and neurosurgery Note intraventricular drain has been placed  Hypotension Assessment: Post procedure hypotension likely on the basis of volume depletion and sedation Plan Administer saline bolus May yet need central line placement  Bradycardia Assessment: Bradycardia was present on admission and is sinus bradycardia in nature Plan Will need continued monitoring, may need to use other sedative agent besides propofol    Best practices / Disposition: -->ICU status under PCCM -->full code -->SCD for DVT Px -->Protonix for GI Px -->ventilator bundle -->diet>>>NPO -->family not available at present  The patient is critically ill with multiple organ systems failure and requires high complexity decision making for assessment and support, frequent evaluation and titration of therapies, application of advanced monitoring technologies and extensive  interpretation of multiple databases. Critical Care Time devoted to patient care services described in this note is 40 minutes.  Asencion Noble  M.D. Pulmonary and [place] Cell: [phone]   Pager: [phone] 09/17/2011, 5:25 PM

## 2011-09-17 NOTE — ED Provider Notes (Signed)
History     CSN: [id number]  Arrival date & time 09/17/11  0703   First MD Initiated Contact with Patient 09/17/11 [phone]      Chief Complaint  Patient presents with  . Altered Mental Status    (Consider location/radiation/quality/duration/timing/severity/associated sxs/prior treatment) HPI The patient presents after a non-prodromal syncopal episode.  The patient recalls walking his dogs, going to the bathroom, and awakening with his wife standing above him.  Upon awakening the patient complains of headache, no dyspnea, no chest pain, mild confusion. The patient has been in his usual state of health, denies medication use, and states that he never goes to the doctor. The patient's wife notes that she found her husband in the bathroom on the floor, at the base of the commode, "gurgling".  She did not check his pulse, but began chest compressions.  She notes that after a few moments of compressions the patient awakened.  Per EMS the patient was confused on their arrival, with 2/3 orientation. No past medical history on file.  No past surgical history on file.  No family history on file.  History  Substance Use Topics  . Smoking status: Not on file  . Smokeless tobacco: Not on file  . Alcohol Use: Not on file      Review of Systems  All other systems reviewed and are negative.    Allergies  Review of patient's allergies indicates not on file.  Home Medications  No current outpatient prescriptions on file.  BP 160/93  Pulse 61  Temp(Src) 97.7 F (36.5 C) (Oral)  Resp 16  SpO2 97%  Physical Exam  Nursing note and vitals reviewed. Constitutional: He is oriented to person, place, and time. He appears well-developed. No distress.  HENT:  Head: Normocephalic. Head is without raccoon's eyes, without Battle's sign, without abrasion, without contusion, without laceration, without right periorbital erythema and without left periorbital erythema. Hair is normal.  Nose: Nose  normal.  Mouth/Throat: Oropharynx is clear and moist and mucous membranes are normal. No oral lesions.       No facial boney deformities, or palpable depressions.  Eyes: Conjunctivae and EOM are normal.  Neck:    Cardiovascular: Normal rate and regular rhythm.   Pulmonary/Chest: Effort normal. No stridor. No respiratory distress.  Abdominal: He exhibits no distension.  Musculoskeletal: He exhibits no edema.  Neurological: He is alert and oriented to person, place, and time.  Skin: Skin is warm and dry.     Psychiatric: He has a normal mood and affect.    ED Course  Procedures (including critical care time)   Labs Reviewed  CBC  DIFFERENTIAL  COMPREHENSIVE METABOLIC PANEL  TROPONIN I   No results found.   No diagnosis found.  Pulse ox 99% ra- normal Cardiac 65sr - normal   Date: 09/17/2011  Rate: 70  Rhythm: sr, w bigeminy  QRS Axis: normal  Intervals: normal  ST/T Wave abnormalities: nonspecific T wave changes  Conduction Disutrbances:bigeminy - super ventricular  Narrative Interpretation:   Old EKG Reviewed: none available BORDERLINE ECG   CXR and CT reviewed by me.  8:07 AM As the patient was completing his CT studies, I discussed the early e/o a SAH w Dr. Jeannine Kitten (Radiology).  Neurosurgery was immediately paged.   MDM  This otherwise well male presents after a syncopal episode.  On arrival the patient is awake, alert, perseverative but with no other focal neurologic deficits.  The patient's evaluation is notable for subarachnoid hemorrhage.  The  patient's case was discussed with neurosurgery.  The patient was hemodynamically stable, though w slight hypertension.  Superficial abrasions were treated.  8:10 AM On re-eval the patient had no new complaints, but w continued HA, he was provided Dilaudid / Zofran.  I discussed the case with our neurosurgeon, who was in the OR. 9:27 AM Patient has continued to have HA refractory to  Dilaudid 0.60m boluses.  VS remain  consistent (~170/100).  No change in MS.  11:38 AM VS remain similar.  Given the patient's continued HA he received additional doses of dilaudid, and was provided decadron as well.  No new deficits.  [facility]  CRITICAL CARE Performed by: LCarmin Muskrat  Total critical care time: 35  Critical care time was exclusive of separately billable procedures and treating other patients.  Critical care was necessary to treat or prevent imminent or life-threatening deterioration.  Critical care was time spent personally by me on the following activities: development of treatment plan with patient and/or surrogate as well as nursing, discussions with consultants, evaluation of patient's response to treatment, examination of patient, obtaining history from patient or surrogate, ordering and performing treatments and interventions, ordering and review of laboratory studies, ordering and review of radiographic studies, pulse oximetry and re-evaluation of patient's condition.       RCarmin Muskrat MD 09/17/11 1140

## 2011-09-17 NOTE — ED Notes (Signed)
Reported off to anesthesia.  All monitoring and direct pt care assumed by anesthesia

## 2011-09-17 NOTE — Progress Notes (Signed)
eLink Physician-Brief Progress Note Patient Name: Sean Pope DOB: [birth year] MRN: [id number]  Date of Service  09/17/2011   HPI/Events of Note   Consulted by dr Sherwood Gambler to manage vent for gr 2 SAH  eICU Interventions  Placed orders for propofol, vent, Abg, cxr   Intervention Category Major Interventions: Respiratory failure - evaluation and management  Roe Wilner V. 09/17/2011, 4:51 PM

## 2011-09-17 NOTE — ED Notes (Signed)
Pt states the pain is not being relieved in the least.

## 2011-09-17 NOTE — ED Notes (Signed)
Per EMS:  Pt was found on the toilet by his wife with agonol respirations and LOC  Pt's wife was told to perform chest compressions by emergency services about 60-70 compressions.  Fire Dept arrived and pt was arousable at the time.  Pt was confused when EMS arrived.  Pt was alert to person and place but not time or situation.  Stroke scale was negative by EMS - CBG was performed @ 125

## 2011-09-17 NOTE — Anesthesia Procedure Notes (Signed)
Procedure Name: Intubation Date/Time: 09/17/2011 2:08 PM Performed by: Jon Gills A Pre-anesthesia Checklist: Patient identified, Emergency Drugs available, Suction available and Patient being monitored Patient Re-evaluated:Patient Re-evaluated prior to inductionOxygen Delivery Method: Circle system utilized Preoxygenation: Pre-oxygenation with 100% oxygen Intubation Type: IV induction, Rapid sequence and Cricoid Pressure applied Laryngoscope Size: Miller and 2 Grade View: Grade I Tube type: Oral Tube size: 7.5 mm Number of attempts: 1 Airway Equipment and Method: Stylet Placement Confirmation: ETT inserted through vocal cords under direct vision,  positive ETCO2 and breath sounds checked- equal and bilateral Secured at: 23 cm Tube secured with: Tape Dental Injury: Teeth and Oropharynx as per pre-operative assessment

## 2011-09-18 ENCOUNTER — Inpatient Hospital Stay (HOSPITAL_COMMUNITY): Payer: Managed Care, Other (non HMO)

## 2011-09-18 ENCOUNTER — Encounter (HOSPITAL_COMMUNITY): Payer: Self-pay | Admitting: Radiology

## 2011-09-18 DIAGNOSIS — R4182 Altered mental status, unspecified: Secondary | ICD-10-CM

## 2011-09-18 LAB — DIFFERENTIAL
Basophils Absolute: 0 10*3/uL (ref 0.0–0.1)
Basophils Relative: 0 % (ref 0–1)
Eosinophils Absolute: 0 10*3/uL (ref 0.0–0.7)
Eosinophils Relative: 0 % (ref 0–5)
Lymphocytes Relative: 7 % — ABNORMAL LOW (ref 12–46)
Lymphs Abs: 1 10*3/uL (ref 0.7–4.0)
Monocytes Absolute: 0.9 10*3/uL (ref 0.1–1.0)
Monocytes Relative: 6 % (ref 3–12)
Neutro Abs: 13.7 10*3/uL — ABNORMAL HIGH (ref 1.7–7.7)
Neutrophils Relative %: 88 % — ABNORMAL HIGH (ref 43–77)

## 2011-09-18 LAB — BASIC METABOLIC PANEL
BUN: 18 mg/dL (ref 6–23)
CO2: 21 mEq/L (ref 19–32)
Calcium: 8.3 mg/dL — ABNORMAL LOW (ref 8.4–10.5)
Chloride: 108 mEq/L (ref 96–112)
Creatinine, Ser: 0.84 mg/dL (ref 0.50–1.35)
GFR calc Af Amer: >90 mL/min (ref >90)
GFR calc non Af Amer: 89 mL/min — ABNORMAL LOW (ref >90)
Glucose, Bld: 120 mg/dL — ABNORMAL HIGH (ref 70–99)
Potassium: 4.3 mEq/L (ref 3.5–5.1)
Sodium: 137 mEq/L (ref 135–145)

## 2011-09-18 LAB — URINE CULTURE: Colony Count: NO GROWTH

## 2011-09-18 LAB — CBC
HCT: 33.8 % — ABNORMAL LOW (ref 39.0–52.0)
Hemoglobin: 11.5 g/dL — ABNORMAL LOW (ref 13.0–17.0)
MCH: 30.9 pg (ref 26.0–34.0)
MCHC: 34 g/dL (ref 30.0–36.0)
MCV: 90.9 fL (ref 78.0–100.0)
Platelets: 271 10*3/uL (ref 150–400)
RBC: 3.72 MIL/uL — ABNORMAL LOW (ref 4.22–5.81)
RDW: 14.1 % (ref 11.5–15.5)
WBC: 15.6 10*3/uL — ABNORMAL HIGH (ref 4.0–10.5)

## 2011-09-18 MED ORDER — STROKE: EARLY STAGES OF RECOVERY BOOK
Freq: Once | Status: AC
  Administered 2011-09-18: 16:00:00
  Filled 2011-09-18 (×2): qty 1

## 2011-09-18 MED ORDER — SODIUM CHLORIDE 0.9 % IV BOLUS (SEPSIS)
500.0000 mL | Freq: Once | INTRAVENOUS | Status: AC
  Administered 2011-09-18: 05:00:00 via INTRAVENOUS

## 2011-09-18 NOTE — Discharge Instructions (Signed)
STROKE/TIA DISCHARGE INSTRUCTIONS SMOKING Cigarette smoking nearly doubles your risk of having a stroke & is the single most alterable risk factor  If you smoke or have smoked in the last 12 months, you are advised to quit smoking for your health.  Most of the excess cardiovascular risk related to smoking disappears within a year of stopping.  Ask you doctor about anti-smoking medications  [place] Quit Line: 1-800-QUIT NOW  Free Smoking Cessation Classes ([phone]  CHOLESTEROL Know your levels; limit fat & cholesterol in your diet  Lipid Panel  No results found for this basename: chol, trig, hdl, cholhdl, vldl, ldlcalc      Many patients benefit from treatment even if their cholesterol is at goal.  Goal: Total Cholesterol (CHOL) less than 160  Goal:  Triglycerides (TRIG) less than 150  Goal:  HDL greater than 40  Goal:  LDL (LDLCALC) less than 100   BLOOD PRESSURE American Stroke Association blood pressure target is less that 120/80 mm/Hg  Your discharge blood pressure is:  BP: 173/66 mmHg  Monitor your blood pressure  Limit your salt and alcohol intake  Many individuals will require more than one medication for high blood pressure  DIABETES (A1c is a blood sugar average for last 3 months) Goal HGBA1c is under 7% (HBGA1c is blood sugar average for last 3 months)  Diabetes: {STROKE DC DIABETES:22357}    No results found for this basename: HGBA1C     Your HGBA1c can be lowered with medications, healthy diet, and exercise.  Check your blood sugar as directed by your physician  Call your physician if you experience unexplained or low blood sugars.  PHYSICAL ACTIVITY/REHABILITATION Goal is 30 minutes at least 4 days per week    {STROKE DC ACTIVITY/REHAB:22359}  Activity decreases your risk of heart attack and stroke and makes your heart stronger.  It helps control your weight and blood pressure; helps you relax and can improve your mood.  Participate in a regular exercise  program.  Talk with your doctor about the best form of exercise for you (dancing, walking, swimming, cycling).  DIET/WEIGHT Goal is to maintain a healthy weight  Your discharge diet is: NPO *** liquids Your height is:  Height: 5' 10"  (177.8 cm) Your current weight is: Weight: 78.3 kg (172 lb 9.9 oz) Your Body Mass Index (BMI) is:  BMI (Calculated): 24.8   Following the type of diet specifically designed for you will help prevent another stroke.  Your goal weight range is:  ***  Your goal Body Mass Index (BMI) is 19-24.  Healthy food habits can help reduce 3 risk factors for stroke:  High cholesterol, hypertension, and excess weight.  RESOURCES Stroke/Support Group:  Call [phone]  they meet the 3rd Sunday of the month on the Rehab Unit at [facility], [place] ( no meetings June, July & Aug).  STROKE EDUCATION PROVIDED/REVIEWED AND GIVEN TO PATIENT Stroke warning signs and symptoms How to activate emergency medical system (call 911). Medications prescribed at discharge. Need for follow-up after discharge. Personal risk factors for stroke. Pneumonia vaccine given:   {STROKE DC YES/NO/DATE:22363} Flu vaccine given:   {STROKE DC YES/NO/DATE:22363} My questions have been answered, the writing is legible, and I understand these instructions.  I will adhere to these goals & educational materials that have been provided to me after my discharge from the hospital.

## 2011-09-18 NOTE — Progress Notes (Signed)
UR COMPLETED  

## 2011-09-18 NOTE — Progress Notes (Signed)
Subjective:  Patient resting comfortably in ICU, continues on ventilator via endotracheal tube. Has been sedated with propofol, which was stopped for examination. An IVC continues to drain well, CSF is blood-tinged.  Objective: Vital signs in last 24 hours: Filed Vitals:   09/18/11 0600 09/18/11 0700 09/18/11 0753 09/18/11 0800  BP: 114/53 101/51  173/66  Pulse: 28 48  66  Temp:      TempSrc:      Resp: 16 16  15   Height:      Weight:      SpO2: 96% 95% 100% 100%    Intake/Output from previous day: 04/01 0701 - 04/02 0700 In: 4550.9 [I.V.:3290.9; IV Piggyback:1260] Out: 4401 [Urine:1450; Drains:109] Intake/Output this shift: Total I/O In: 29.1 [I.V.:29.1] Out: 120 [Urine:90; Drains:30]  Physical Exam:  After stopping propofol, patient opening eyes spontaneously. He is following commands with all 4 extremities. Pupils are equal round and reactive to light. Extraocular movements are intact.  CBC  Basename 09/18/11 0445 09/17/11 0721  WBC 15.6* 11.8*  HGB 11.5* 12.9*  HCT 33.8* 38.0*  PLT 271 292   BMET  Basename 09/18/11 0445 09/17/11 0721  NA 137 139  K 4.3 4.2  CL 108 106  CO2 21 24  GLUCOSE 120* 167*  BUN 18 20  CREATININE 0.84 0.90  CALCIUM 8.3* 9.3   Studies/Results: Dg Chest 2 View  09/17/2011  *RADIOLOGY REPORT*  Clinical Data: [place] hitting head.  No chest complaints.  Slight cough.  Congestion.  CHEST - 2 VIEW  Comparison: None.  Findings: No infiltrate, congestive heart failure or pneumothorax.  Cardiac leads overlie the slightly limiting evaluation without gross abnormality noted.  Mildly tortuous ascending thoracic aorta.  Peribronchial thickening may represent changes of chronic bronchitis.  Heart size within normal limits.  IMPRESSION: Minimal peribronchial thickening may represent chronic bronchitis type changes.  Slightly tortuous ascending thoracic aorta.  Original Report Authenticated By: Doug Sou, M.D.   Ct Head Wo Contrast  09/18/2011   *RADIOLOGY REPORT*  Clinical Data: Followup subarachnoid hemorrhage post ventriculostomy placement.  CT HEAD WITHOUT CONTRAST  Technique:  Contiguous axial images were obtained from the base of the skull through the vertex without contrast.  Comparison: 09/17/2011  Findings: Right frontal ventriculostomy has been placed with the catheter coursing through the right lateral ventricle frontal horn and the tip at the level of the right foramen of Monro/third ventricle.  Slight decrease in degree of hydrocephalus. Interventricular blood remains within the dependent aspect.  Diffuse subarachnoid hemorrhage once again noted.  Post aneurysm coiling of the right posterior communicating artery region.  Mild hypodensity midbrain may be related to artifact rather than infarct.  Cerebellar tonsils slightly low-lying.  Medial displacement of the uncus bilaterally.  These findings are consistent with increased intracranial pressure.  Remote infarcts left cerebellum unchanged.  Exophthalmos.  IMPRESSION: Placement of a ventriculostomy with decrease in degree of hydrocephalus.  Diffuse subarachnoid hemorrhage and interventricular blood remains with evidence of increased intracranial pressure indicated by mild medial displacement of the uncus and slight low-lying cerebellar tonsils.  Original Report Authenticated By: Doug Sou, M.D.   Ct Head Wo Contrast  09/17/2011  *RADIOLOGY REPORT*  Clinical Data: Aneurysm coiling today.  CT HEAD WITHOUT CONTRAST  Technique:  Contiguous axial images were obtained from the base of the skull through the vertex without contrast.  Comparison: CT 09/17/2011  Findings: There is diffuse subarachnoid hemorrhage in the basilar cisterns and sylvian fissure which is similar in degree.  Increased blood  layering in the occipital horns bilaterally compared with the prior study.  There is developing hydrocephalus with enlargement of the ventricles since earlier today.  Hypodensity left cerebellum  appears chronic.  No acute infarct or mass.  Aneurysm coil present in the region of the right posterior communicating artery.  IMPRESSION: Diffuse subarachnoid hemorrhage again noted.  Progression of blood in the ventricles and progression of hydrocephalus since earlier today.  Critical Value/emergent results were called by telephone at the time of interpretation on   09/17/2011  at 1545 hours  to  Dr. Estanislado Pandy, who verbally acknowledged these results.  Original Report Authenticated By: Truett Perna, M.D.   Ct Head Wo Contrast  09/17/2011  *RADIOLOGY REPORT*  Clinical Data:  Mental status changes while on toilet.  CT HEAD WITHOUT CONTRAST CT CERVICAL SPINE WITHOUT CONTRAST  Technique:  Multidetector CT imaging of the head and cervical spine was performed following the standard protocol without intravenous contrast.  Multiplanar CT image reconstructions of the cervical spine were also generated.  Comparison:   None  CT HEAD  Findings: Diffuse subarachnoid hemorrhage asymmetric greater on the right.  Findings most suggestive of ruptured aneurysm possibly right middle cerebral artery or right posterior communicating artery.  Ventricles are slightly prominent in size which may represent early hydrocephalus.  Remote small left cerebellar infarct.  Small vessel disease type changes. No intracranial mass lesion detected on this unenhanced exam.  Polypoid opacification right sphenoid sinus and maxillary sinuses.  IMPRESSION: Diffuse subarachnoid hemorrhage with suggestion of early hydrocephalus.  Please see above.  CT CERVICAL SPINE  Findings: No cervical spine fracture or malalignment.  Scattered mild cervical spondylotic changes.  IMPRESSION: No cervical spine fracture.  Critical Value/emergent results were called by telephone at the time of interpretation on 09/17/2011  at 7:54 a.m.  to  Dr. Vanita Panda, who verbally acknowledged these results.  Original Report Authenticated By: Doug Sou, M.D.   Ct Cervical  Spine Wo Contrast  09/17/2011  *RADIOLOGY REPORT*  Clinical Data:  Mental status changes while on toilet.  CT HEAD WITHOUT CONTRAST CT CERVICAL SPINE WITHOUT CONTRAST  Technique:  Multidetector CT imaging of the head and cervical spine was performed following the standard protocol without intravenous contrast.  Multiplanar CT image reconstructions of the cervical spine were also generated.  Comparison:   None  CT HEAD  Findings: Diffuse subarachnoid hemorrhage asymmetric greater on the right.  Findings most suggestive of ruptured aneurysm possibly right middle cerebral artery or right posterior communicating artery.  Ventricles are slightly prominent in size which may represent early hydrocephalus.  Remote small left cerebellar infarct.  Small vessel disease type changes. No intracranial mass lesion detected on this unenhanced exam.  Polypoid opacification right sphenoid sinus and maxillary sinuses.  IMPRESSION: Diffuse subarachnoid hemorrhage with suggestion of early hydrocephalus.  Please see above.  CT CERVICAL SPINE  Findings: No cervical spine fracture or malalignment.  Scattered mild cervical spondylotic changes.  IMPRESSION: No cervical spine fracture.  Critical Value/emergent results were called by telephone at the time of interpretation on 09/17/2011  at 7:54 a.m.  to  Dr. Vanita Panda, who verbally acknowledged these results.  Original Report Authenticated By: Doug Sou, M.D.   Dg Chest Port 1 View  09/18/2011  *RADIOLOGY REPORT*  Clinical Data: Check endotracheal tube.  PORTABLE CHEST - 1 VIEW  Comparison: 09/17/2011.  Findings: Endotracheal tube tip is endotracheal tube tip is 6 cm above the carina.  Nasogastric tube courses below the diaphragm.  The tip is  not included on this exam.  Consolidation right lung base may represent infiltrate, atelectasis and / or pleural effusion.  The heart size within normal limits.  No gross pneumothorax.  IMPRESSION: Consolidation right base may represent shifting  pleural fluid. Right base atelectasis or infiltrate not excluded.  Original Report Authenticated By: Doug Sou, M.D.   Dg Chest Port 1 View  09/17/2011  *RADIOLOGY REPORT*  Clinical Data: Endotracheal tube placement  PORTABLE CHEST - 1 VIEW  Comparison: 09/17/2011  Findings: Endotracheal tube in satisfactory position.  Interval development of right middle lobe and right lower lobe airspace disease which could be due to pneumonia or atelectasis.  Aspiration is a possibility.  Left lung is clear.  No heart failure or effusion.  IMPRESSION: Endotracheal tube in good position.  New area of airspace disease in the right lung base which may be pneumonia.  Original Report Authenticated By: Truett Perna, M.D.    Assessment/Plan: CT this morning shows IVC in good position, and ventricular size has decreased some, but ventricles remain slightly dilated. Still moderate subarachnoid hemorrhage within the cisterns and fissures. We'll lower IVC to 5 cm of water. We will recheck CT later this week. Neurologically responding well, will wean from the ventilator to extubation as feasible with the assistance of CCM. Continues on Nimotop and IV fluids.    Hosie Spangle, MD 09/18/2011, 8:15 AM

## 2011-09-18 NOTE — Progress Notes (Signed)
PT Cancellation Note  Evaluation cancelled today due to pt still with IVC. PT/OOB ordered by CCM. Tammy, RN contacted Dr. Sherwood Gambler to see if IVC could be clamped for OOB activities. Reported he said not today and maybe tomorrow.  Will follow for appropriateness to intiate PT eval..  Graysyn Bache 09/18/2011, 2:03 PM Pager [phone]

## 2011-09-18 NOTE — Progress Notes (Addendum)
1 Day Post-Op  Subjective: Right Posterior communicating artery aneurysm coiling performed 4/1 with Dr Estanislado Pandy Pt tolerated procedure well Now in Neuro ICU Stable IVC drain intact No complaints  Objective: Vital signs in last 24 hours: Temp:  [96.9 F (36.1 C)-98.4 F (36.9 C)] 98.4 F (36.9 C) (04/02 0900) Pulse Rate:  [28-107] 78  (04/02 0900) Resp:  [13-18] 16  (04/02 0900) BP: (91-186)/(45-88) 117/56 mmHg (04/02 0900) SpO2:  [79 %-100 %] 95 % (04/02 0911) Arterial Line BP: (99-155)/(41-64) 143/58 mmHg (04/02 0900) FiO2 (%):  [40 %-99.9 %] 40.4 % (04/02 0900) Weight:  [172 lb 9.9 oz (78.3 kg)] 172 lb 9.9 oz (78.3 kg) (04/01 1550)    Intake/Output from previous day: 04/01 0701 - 04/02 0700 In: 4550.9 [I.V.:3290.9; IV Piggyback:1260] Out: 1559 [Urine:1450; Drains:109] Intake/Output this shift: Total I/O In: 29.1 [I.V.:29.1] Out: 195 [Urine:140; Drains:55]  PE:  VSS; afeb Alert/oriented EOMI; tongue midline puffs cheeks =; face symmetrical Speech plain Moves all 4s; = strength Rt groin NT; soft; no bleeding; no hematoma Rt foot 2+ pulses Intraventric drain intact; moderately bloody output Wbc up (decadron inj) Lab Results:   Basename 09/18/11 0445 09/17/11 0721  WBC 15.6* 11.8*  HGB 11.5* 12.9*  HCT 33.8* 38.0*  PLT 271 292   BMET  Basename 09/18/11 0445 09/17/11 0721  NA 137 139  K 4.3 4.2  CL 108 106  CO2 21 24  GLUCOSE 120* 167*  BUN 18 20  CREATININE 0.84 0.90  CALCIUM 8.3* 9.3   PT/INR  Basename 09/17/11 1315  LABPROT 13.1  INR 0.97   ABG  Basename 09/17/11 1745  PHART 7.322*  HCO3 20.9    Studies/Results: Dg Chest 2 View  09/17/2011  *RADIOLOGY REPORT*  Clinical Data: [place] hitting head.  No chest complaints.  Slight cough.  Congestion.  CHEST - 2 VIEW  Comparison: None.  Findings: No infiltrate, congestive heart failure or pneumothorax.  Cardiac leads overlie the slightly limiting evaluation without gross abnormality noted.  Mildly  tortuous ascending thoracic aorta.  Peribronchial thickening may represent changes of chronic bronchitis.  Heart size within normal limits.  IMPRESSION: Minimal peribronchial thickening may represent chronic bronchitis type changes.  Slightly tortuous ascending thoracic aorta.  Original Report Authenticated By: Doug Sou, M.D.   Ct Head Wo Contrast  09/18/2011  *RADIOLOGY REPORT*  Clinical Data: Followup subarachnoid hemorrhage post ventriculostomy placement.  CT HEAD WITHOUT CONTRAST  Technique:  Contiguous axial images were obtained from the base of the skull through the vertex without contrast.  Comparison: 09/17/2011  Findings: Right frontal ventriculostomy has been placed with the catheter coursing through the right lateral ventricle frontal horn and the tip at the level of the right foramen of Monro/third ventricle.  Slight decrease in degree of hydrocephalus. Interventricular blood remains within the dependent aspect.  Diffuse subarachnoid hemorrhage once again noted.  Post aneurysm coiling of the right posterior communicating artery region.  Mild hypodensity midbrain may be related to artifact rather than infarct.  Cerebellar tonsils slightly low-lying.  Medial displacement of the uncus bilaterally.  These findings are consistent with increased intracranial pressure.  Remote infarcts left cerebellum unchanged.  Exophthalmos.  IMPRESSION: Placement of a ventriculostomy with decrease in degree of hydrocephalus.  Diffuse subarachnoid hemorrhage and interventricular blood remains with evidence of increased intracranial pressure indicated by mild medial displacement of the uncus and slight low-lying cerebellar tonsils.  Original Report Authenticated By: Doug Sou, M.D.   Ct Head Wo Contrast  09/17/2011  *RADIOLOGY  REPORT*  Clinical Data: Aneurysm coiling today.  CT HEAD WITHOUT CONTRAST  Technique:  Contiguous axial images were obtained from the base of the skull through the vertex without  contrast.  Comparison: CT 09/17/2011  Findings: There is diffuse subarachnoid hemorrhage in the basilar cisterns and sylvian fissure which is similar in degree.  Increased blood layering in the occipital horns bilaterally compared with the prior study.  There is developing hydrocephalus with enlargement of the ventricles since earlier today.  Hypodensity left cerebellum appears chronic.  No acute infarct or mass.  Aneurysm coil present in the region of the right posterior communicating artery.  IMPRESSION: Diffuse subarachnoid hemorrhage again noted.  Progression of blood in the ventricles and progression of hydrocephalus since earlier today.  Critical Value/emergent results were called by telephone at the time of interpretation on   09/17/2011  at 1545 hours  to  Dr. Estanislado Pandy, who verbally acknowledged these results.  Original Report Authenticated By: Truett Perna, M.D.   Ct Head Wo Contrast  09/17/2011  *RADIOLOGY REPORT*  Clinical Data:  Mental status changes while on toilet.  CT HEAD WITHOUT CONTRAST CT CERVICAL SPINE WITHOUT CONTRAST  Technique:  Multidetector CT imaging of the head and cervical spine was performed following the standard protocol without intravenous contrast.  Multiplanar CT image reconstructions of the cervical spine were also generated.  Comparison:   None  CT HEAD  Findings: Diffuse subarachnoid hemorrhage asymmetric greater on the right.  Findings most suggestive of ruptured aneurysm possibly right middle cerebral artery or right posterior communicating artery.  Ventricles are slightly prominent in size which may represent early hydrocephalus.  Remote small left cerebellar infarct.  Small vessel disease type changes. No intracranial mass lesion detected on this unenhanced exam.  Polypoid opacification right sphenoid sinus and maxillary sinuses.  IMPRESSION: Diffuse subarachnoid hemorrhage with suggestion of early hydrocephalus.  Please see above.  CT CERVICAL SPINE  Findings: No  cervical spine fracture or malalignment.  Scattered mild cervical spondylotic changes.  IMPRESSION: No cervical spine fracture.  Critical Value/emergent results were called by telephone at the time of interpretation on 09/17/2011  at 7:54 a.m.  to  Dr. Vanita Panda, who verbally acknowledged these results.  Original Report Authenticated By: Doug Sou, M.D.   Ct Cervical Spine Wo Contrast  09/17/2011  *RADIOLOGY REPORT*  Clinical Data:  Mental status changes while on toilet.  CT HEAD WITHOUT CONTRAST CT CERVICAL SPINE WITHOUT CONTRAST  Technique:  Multidetector CT imaging of the head and cervical spine was performed following the standard protocol without intravenous contrast.  Multiplanar CT image reconstructions of the cervical spine were also generated.  Comparison:   None  CT HEAD  Findings: Diffuse subarachnoid hemorrhage asymmetric greater on the right.  Findings most suggestive of ruptured aneurysm possibly right middle cerebral artery or right posterior communicating artery.  Ventricles are slightly prominent in size which may represent early hydrocephalus.  Remote small left cerebellar infarct.  Small vessel disease type changes. No intracranial mass lesion detected on this unenhanced exam.  Polypoid opacification right sphenoid sinus and maxillary sinuses.  IMPRESSION: Diffuse subarachnoid hemorrhage with suggestion of early hydrocephalus.  Please see above.  CT CERVICAL SPINE  Findings: No cervical spine fracture or malalignment.  Scattered mild cervical spondylotic changes.  IMPRESSION: No cervical spine fracture.  Critical Value/emergent results were called by telephone at the time of interpretation on 09/17/2011  at 7:54 a.m.  to  Dr. Vanita Panda, who verbally acknowledged these results.  Original Report Authenticated By:  Doug Sou, M.D.   Dg Chest Port 1 View  09/18/2011  *RADIOLOGY REPORT*  Clinical Data: Check endotracheal tube.  PORTABLE CHEST - 1 VIEW  Comparison: 09/17/2011.  Findings:  Endotracheal tube tip is endotracheal tube tip is 6 cm above the carina.  Nasogastric tube courses below the diaphragm.  The tip is not included on this exam.  Consolidation right lung base may represent infiltrate, atelectasis and / or pleural effusion.  The heart size within normal limits.  No gross pneumothorax.  IMPRESSION: Consolidation right base may represent shifting pleural fluid. Right base atelectasis or infiltrate not excluded.  Original Report Authenticated By: Doug Sou, M.D.   Dg Chest Port 1 View  09/17/2011  *RADIOLOGY REPORT*  Clinical Data: Endotracheal tube placement  PORTABLE CHEST - 1 VIEW  Comparison: 09/17/2011  Findings: Endotracheal tube in satisfactory position.  Interval development of right middle lobe and right lower lobe airspace disease which could be due to pneumonia or atelectasis.  Aspiration is a possibility.  Left lung is clear.  No heart failure or effusion.  IMPRESSION: Endotracheal tube in good position.  New area of airspace disease in the right lung base which may be pneumonia.  Original Report Authenticated By: Truett Perna, M.D.    Anti-infectives: Anti-infectives     Start     Dose/Rate Route Frequency Ordered Stop   09/17/11 1417   ceFAZolin (ANCEF) 1-5 GM-% IVPB     Comments: HINES, KRIS: cabinet override         09/17/11 1417 09/18/11 0229          Assessment/Plan: s/p Procedure(s) (LRB): RADIOLOGY WITH ANESTHESIA (N/A)  R Pcom aneurysm coiling performed 4/1 Pt doing so well this am No complaints; alert Up in bed Will report to Dr Threasa Beards A 09/18/2011

## 2011-09-18 NOTE — Progress Notes (Signed)
[facility] Progress Note Patient Name: Sean Pope DOB: [birth year] MRN: [id number]  Date of Service  09/18/2011   HPI/Events of Note   oliguric  eICU Interventions   500cc NS bolus IV now   Intervention Category Intermediate Interventions: Oliguria - evaluation and management  Dora Simeone 09/18/2011, 5:12 AM

## 2011-09-18 NOTE — Progress Notes (Addendum)
Name: Sean Pope MRN: [id number] DOB: [birth year]    LOS: 1  PCCM PROGRESS NOTE  History of Present Illness: This is a 66 year old white male who presents with a right P-comm aneurysm with spontaneous rupture. The patient has a negative past history the exception of active smoking. After coming home from walking his dog this morning he developed acute onset headaches and nausea. Patient brought to the emergency room found to have a subarachnoid hemorrhage grade 2. Patient was taken to cerebral angiography and found to have a right P-comm aneurysm rupture. This was coiled by neuroradiology.  Postop the patient has been left on mechanical vent support and brought back to the intensive care. An intraventricular drain has been placed on neurosurgery. There was dilation of the fourth ventricle seen on CT scan. Critical care medicine was asked to help with support this patient.  Lines / Drains: IVC drain 09/17/11>>> ETT 09/17/11>>>4/2  Cultures: none  Antibiotics: none  Tests / Events: extubated  Vital Signs: Temp:  [96.9 F (36.1 C)-98.2 F (36.8 C)] 98 F (36.7 C) (04/02 0800) Pulse Rate:  [28-107] 78  (04/02 0900) Resp:  [13-18] 16  (04/02 0900) BP: (91-186)/(45-88) 117/56 mmHg (04/02 0900) SpO2:  [79 %-100 %] 95 % (04/02 0911) Arterial Line BP: (99-155)/(41-64) 143/58 mmHg (04/02 0900) FiO2 (%):  [40 %-99.9 %] 40.4 % (04/02 0900) Weight:  [172 lb 9.9 oz (78.3 kg)] 172 lb 9.9 oz (78.3 kg) (04/01 1550) I/O last 3 completed shifts: In: 4550.9 [I.V.:3290.9; IV Piggyback:1260] Out: 5188 [Urine:1450; Drains:109]  Physical Examination: General:  Patient in no distress awake on ventilator Neuro:  awaken  , right intraventricular drain placed HEENT:  Endotracheal tube in place, no other overt lesions seen Neck:  Neck supple no thyromegaly   Cardiovascular:  Regular rate and rhythm without S3 normal S1-S2 Lungs:  Clear without wheeze or rhonchi Abdomen:  Soft nontender bowel sounds  hypoactive no masses Musculoskeletal:  Full range of motion Skin:  Clear  Ventilator settings: Vent Mode:  [-] CPAP FiO2 (%):  [40 %-99.9 %] 40.4 % Set Rate:  [16 bmp] 16 bmp Vt Set:  [600 mL] 600 mL PEEP:  [0 cmH20-5.6 cmH20] 0 cmH20 Pressure Support:  [5 cmH20] 5 cmH20 Plateau Pressure:  [12 cmH20-20 cmH20] 12 cmH20  Labs and Imaging:  Reviewed.  Please refer to the Assessment and Plan section for relevant results.  Lab 09/18/11 0445 09/17/11 0721  NA 137 139  K 4.3 4.2  CL 108 106  CO2 21 24  GLUCOSE 120* 167*  BUN 18 20  CREATININE 0.84 0.90  CALCIUM 8.3* 9.3  MG -- --  PHOS -- --    Lab 09/18/11 0445 09/17/11 0721  HGB 11.5* 12.9*  HCT 33.8* 38.0*  WBC 15.6* 11.8*  PLT 271 292    Lab 09/17/11 1315 09/17/11 0721  AST -- 18  ALT -- 15  ALKPHOS -- 80  BILITOT -- 0.3  PROT -- 6.4  ALBUMIN -- 3.6  INR 0.97 --   Dg Chest 2 View  09/17/2011  *RADIOLOGY REPORT*  Clinical Data: [place] hitting head.  No chest complaints.  Slight cough.  Congestion.  CHEST - 2 VIEW  Comparison: None.  Findings: No infiltrate, congestive heart failure or pneumothorax.  Cardiac leads overlie the slightly limiting evaluation without gross abnormality noted.  Mildly tortuous ascending thoracic aorta.  Peribronchial thickening may represent changes of chronic bronchitis.  Heart size within normal limits.  IMPRESSION: Minimal peribronchial thickening may represent chronic bronchitis  type changes.  Slightly tortuous ascending thoracic aorta.  Original Report Authenticated By: Doug Sou, M.D.   Ct Head Wo Contrast  09/18/2011  *RADIOLOGY REPORT*  Clinical Data: Followup subarachnoid hemorrhage post ventriculostomy placement.  CT HEAD WITHOUT CONTRAST  Technique:  Contiguous axial images were obtained from the base of the skull through the vertex without contrast.  Comparison: 09/17/2011  Findings: Right frontal ventriculostomy has been placed with the catheter coursing through the right lateral  ventricle frontal horn and the tip at the level of the right foramen of Monro/third ventricle.  Slight decrease in degree of hydrocephalus. Interventricular blood remains within the dependent aspect.  Diffuse subarachnoid hemorrhage once again noted.  Post aneurysm coiling of the right posterior communicating artery region.  Mild hypodensity midbrain may be related to artifact rather than infarct.  Cerebellar tonsils slightly low-lying.  Medial displacement of the uncus bilaterally.  These findings are consistent with increased intracranial pressure.  Remote infarcts left cerebellum unchanged.  Exophthalmos.  IMPRESSION: Placement of a ventriculostomy with decrease in degree of hydrocephalus.  Diffuse subarachnoid hemorrhage and interventricular blood remains with evidence of increased intracranial pressure indicated by mild medial displacement of the uncus and slight low-lying cerebellar tonsils.  Original Report Authenticated By: Doug Sou, M.D.   Ct Head Wo Contrast  09/17/2011  *RADIOLOGY REPORT*  Clinical Data: Aneurysm coiling today.  CT HEAD WITHOUT CONTRAST  Technique:  Contiguous axial images were obtained from the base of the skull through the vertex without contrast.  Comparison: CT 09/17/2011  Findings: There is diffuse subarachnoid hemorrhage in the basilar cisterns and sylvian fissure which is similar in degree.  Increased blood layering in the occipital horns bilaterally compared with the prior study.  There is developing hydrocephalus with enlargement of the ventricles since earlier today.  Hypodensity left cerebellum appears chronic.  No acute infarct or mass.  Aneurysm coil present in the region of the right posterior communicating artery.  IMPRESSION: Diffuse subarachnoid hemorrhage again noted.  Progression of blood in the ventricles and progression of hydrocephalus since earlier today.  Critical Value/emergent results were called by telephone at the time of interpretation on   09/17/2011   at 1545 hours  to  Dr. Estanislado Pandy, who verbally acknowledged these results.  Original Report Authenticated By: Truett Perna, M.D.   Ct Head Wo Contrast  09/17/2011  *RADIOLOGY REPORT*  Clinical Data:  Mental status changes while on toilet.  CT HEAD WITHOUT CONTRAST CT CERVICAL SPINE WITHOUT CONTRAST  Technique:  Multidetector CT imaging of the head and cervical spine was performed following the standard protocol without intravenous contrast.  Multiplanar CT image reconstructions of the cervical spine were also generated.  Comparison:   None  CT HEAD  Findings: Diffuse subarachnoid hemorrhage asymmetric greater on the right.  Findings most suggestive of ruptured aneurysm possibly right middle cerebral artery or right posterior communicating artery.  Ventricles are slightly prominent in size which may represent early hydrocephalus.  Remote small left cerebellar infarct.  Small vessel disease type changes. No intracranial mass lesion detected on this unenhanced exam.  Polypoid opacification right sphenoid sinus and maxillary sinuses.  IMPRESSION: Diffuse subarachnoid hemorrhage with suggestion of early hydrocephalus.  Please see above.  CT CERVICAL SPINE  Findings: No cervical spine fracture or malalignment.  Scattered mild cervical spondylotic changes.  IMPRESSION: No cervical spine fracture.  Critical Value/emergent results were called by telephone at the time of interpretation on 09/17/2011  at 7:54 a.m.  to  Dr. Vanita Panda, who  verbally acknowledged these results.  Original Report Authenticated By: Doug Sou, M.D.   Ct Cervical Spine Wo Contrast  09/17/2011  *RADIOLOGY REPORT*  Clinical Data:  Mental status changes while on toilet.  CT HEAD WITHOUT CONTRAST CT CERVICAL SPINE WITHOUT CONTRAST  Technique:  Multidetector CT imaging of the head and cervical spine was performed following the standard protocol without intravenous contrast.  Multiplanar CT image reconstructions of the cervical spine were also  generated.  Comparison:   None  CT HEAD  Findings: Diffuse subarachnoid hemorrhage asymmetric greater on the right.  Findings most suggestive of ruptured aneurysm possibly right middle cerebral artery or right posterior communicating artery.  Ventricles are slightly prominent in size which may represent early hydrocephalus.  Remote small left cerebellar infarct.  Small vessel disease type changes. No intracranial mass lesion detected on this unenhanced exam.  Polypoid opacification right sphenoid sinus and maxillary sinuses.  IMPRESSION: Diffuse subarachnoid hemorrhage with suggestion of early hydrocephalus.  Please see above.  CT CERVICAL SPINE  Findings: No cervical spine fracture or malalignment.  Scattered mild cervical spondylotic changes.  IMPRESSION: No cervical spine fracture.  Critical Value/emergent results were called by telephone at the time of interpretation on 09/17/2011  at 7:54 a.m.  to  Dr. Vanita Panda, who verbally acknowledged these results.  Original Report Authenticated By: Doug Sou, M.D.   Dg Chest Port 1 View  09/18/2011  *RADIOLOGY REPORT*  Clinical Data: Check endotracheal tube.  PORTABLE CHEST - 1 VIEW  Comparison: 09/17/2011.  Findings: Endotracheal tube tip is endotracheal tube tip is 6 cm above the carina.  Nasogastric tube courses below the diaphragm.  The tip is not included on this exam.  Consolidation right lung base may represent infiltrate, atelectasis and / or pleural effusion.  The heart size within normal limits.  No gross pneumothorax.  IMPRESSION: Consolidation right base may represent shifting pleural fluid. Right base atelectasis or infiltrate not excluded.  Original Report Authenticated By: Doug Sou, M.D.   Dg Chest Port 1 View  09/17/2011  *RADIOLOGY REPORT*  Clinical Data: Endotracheal tube placement  PORTABLE CHEST - 1 VIEW  Comparison: 09/17/2011  Findings: Endotracheal tube in satisfactory position.  Interval development of right middle lobe and right  lower lobe airspace disease which could be due to pneumonia or atelectasis.  Aspiration is a possibility.  Left lung is clear.  No heart failure or effusion.  IMPRESSION: Endotracheal tube in good position.  New area of airspace disease in the right lung base which may be pneumonia.  Original Report Authenticated By: Truett Perna, M.D.    Assessment and Plan: Patient Active Hospital Problem List: Subarachnoid hemorrhage (09/17/2011)   Assessment: Subarachnoid hemorrhage grade 2 due to rupture of right posterior to indicating aneurysm status post coiling    Plan: Per neurosurgery   Acute respiratory failure with hypoxia (09/17/2011)   Assessment: Respiratory failure with mild hypoxemia do to subarachnoid hemorrhage and altered consciousness and need for intubation for placement of coiling and need for ongoing sedation post coiling of aneurysm    Plan: Maintain on full vent support overnight and attempt spontaneous breathing trial on 09-18-11 .   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)   Assessment: Status post coiling per neuro radiology    Plan: Per neuroradiology and neurosurgery Note intraventricular drain has been placed  Hypotension Assessment: Post  procedure hypotension likely on the basis of volume depletion and sedation Plan Administer saline bolus May yet need central line placement  Bradycardia Assessment: Bradycardia was present on admission and is sinus bradycardia in nature Plan Will need continued monitoring,   Best practices / Disposition: -->ICU status under PCCM -->full code -->SCD for DVT Px -->Protonix for GI Px -->ventilator bundle -->diet>>>NPO -->family not available at present  [place] Pager [phone] till 3 pm If no answer page [phone] 09/18/2011, 9:55 AM  Ok to  extubate, titrate O2 for sat of 88-92%, IS and flutter valve, early mobilization as tolerated.  PT/OT.  PCCM will continue to follow medical management.  CC time 35 min.  Patient seen and examined, agree with above note.  I dictated the care and orders written for this patient under my direction.  Jennet Maduro, M.D. [phone]

## 2011-09-18 NOTE — Progress Notes (Signed)
Respiratory Therapy Extubation Procedure Note   Procedure Time out: Performed  Verified Patient ID:  yes Verified Procedure:  yes  Reason For Extubation: no further need for ventilatory support  Special Equipment Available:  no   Pre Extubation Assessment:   Placed in sitting position: yes Lungs inflated prior to tube removal:  yes Gag Reflex:strong  Cuff Deflation Test - Able to breathe around deflated cuff: yes Pharynx Suctioned prior to cuff deflation: yes          Post Extubation Oxygen Delivery Device: 2 liters/min via Patient connected to nasal cannula oxygen  clear to auscultation, no wheezes, rales, or rhonchi  Philipp Deputy RRT,RCP 09/18/2011 9:13 AM

## 2011-09-18 NOTE — Progress Notes (Addendum)
VASCULAR LAB PRELIMINARY  PRELIMINARY  PRELIMINARY  PRELIMINARY  Transcranial Doppler  Date POD PCO2 HCT BP  MCA ACA PCA OPHT SIPH VERT Basilar  09-18-11 hc 1 09-17-11 1745 41.6 09-18-11 0445 33.8 4-2-130 1100 122/56 Right  Left   45     -33          23  19   47  42   -40  -49   -38      09-19-11 hc 2 None since  09-17-11 1745 09-19-11 0500 32.2 09-19-11 1400 140/63 Right  Left   37  42             25  26   -39  44   -39  -39   -40      09-21-11 hc 4    Right  Left   55               23  23   46  37   -49  -49     -41    09-24-11 SB  7    Right  Left   107  74   -39  -101   29  --   20  20   60  54   -28  -33     -45    09-26-11 SB  9    Right  Left   86  112   -68  -58   --  48   24  18   50  52/39   -32  -34     -43    09-28-11 SB 11    Right  Left   104  131   -71  -72   33  44   32  20   47  58   -26  -38     -76    10-02-11 hc 15 None since 09-17-11 09-30-11  31.5 10-02-11 1200 174/76 Right  Left   72     -83     30     29  25    46  35   -35  -34   -55       MCA = Middle Cerebral Artery      OPHT = Opthalmic Artery     BASILAR = Basilar Artery   ACA = Anterior Cerebral Artery     SIPH = Carotid Siphon PCA = Posterior Cerebral Artery   VERT = Verterbral Artery                   Normal MCA = 62+\-12 ACA = 50+\-12 PCA = 42+\-23    Sean Pope 09/18/2011, 11:39 AM Judithann Sauger, RVT 09/19/2011 1:30PM Judithann Sauger RVT 09/21/2011 5:09 PM

## 2011-09-19 ENCOUNTER — Telehealth (HOSPITAL_COMMUNITY): Payer: Self-pay

## 2011-09-19 ENCOUNTER — Inpatient Hospital Stay (HOSPITAL_COMMUNITY): Payer: Managed Care, Other (non HMO)

## 2011-09-19 DIAGNOSIS — I671 Cerebral aneurysm, nonruptured: Secondary | ICD-10-CM

## 2011-09-19 LAB — CBC
HCT: 32.2 % — ABNORMAL LOW (ref 39.0–52.0)
Hemoglobin: 10.7 g/dL — ABNORMAL LOW (ref 13.0–17.0)
MCH: 29.9 pg (ref 26.0–34.0)
MCHC: 33.2 g/dL (ref 30.0–36.0)
MCV: 89.9 fL (ref 78.0–100.0)
RDW: 14.4 % (ref 11.5–15.5)

## 2011-09-19 LAB — BASIC METABOLIC PANEL
BUN: 18 mg/dL (ref 6–23)
Calcium: 8.5 mg/dL (ref 8.4–10.5)
Creatinine, Ser: 0.97 mg/dL (ref 0.50–1.35)
GFR calc non Af Amer: 84 mL/min — ABNORMAL LOW (ref >90)
Glucose, Bld: 105 mg/dL — ABNORMAL HIGH (ref 70–99)
Potassium: 3.8 mEq/L (ref 3.5–5.1)

## 2011-09-19 MED ORDER — CEFEPIME HCL 2 G IJ SOLR
2.0000 g | Freq: Two times a day (BID) | INTRAMUSCULAR | Status: AC
  Administered 2011-09-19 – 2011-09-27 (×17): 2 g via INTRAVENOUS
  Filled 2011-09-19 (×17): qty 2

## 2011-09-19 MED ORDER — NIMODIPINE 30 MG PO CAPS
60.0000 mg | ORAL_CAPSULE | ORAL | Status: DC
  Administered 2011-09-19 – 2011-10-04 (×90): 60 mg via ORAL
  Filled 2011-09-19 (×101): qty 2

## 2011-09-19 NOTE — Progress Notes (Signed)
Name: Sean Pope MRN: [id number] DOB: 02/03/1946    LOS: 2  PCCM PROGRESS NOTE  History of Present Illness: This is a 66 year old white male who presents with a right P-comm aneurysm with spontaneous rupture. The patient has a negative past history the exception of active smoking. After coming home from walking his dog this morning he developed acute onset headaches and nausea. Patient brought to the emergency room found to have a subarachnoid hemorrhage grade 2. Patient was taken to cerebral angiography and found to have a right P-comm aneurysm rupture. This was coiled by neuroradiology.  Postop the patient has been left on mechanical vent support and brought back to the intensive care. An intraventricular drain has been placed on neurosurgery. There was dilation of the fourth ventricle seen on CT scan. Critical care medicine was asked to help with support this patient.  Lines / Drains: IVC drain 09/17/11>>> ETT 09/17/11>>>4/2  Cultures: none  Antibiotics: none  Tests / Events:  hypoxia during night ?osa Vital Signs: Temp:  [98.1 F (36.7 C)-99.7 F (37.6 C)] 98.1 F (36.7 C) (04/03 0400) Pulse Rate:  [30-98] 71  (04/03 0800) Resp:  [15-22] 18  (04/03 0800) BP: (106-169)/(51-88) 169/73 mmHg (04/03 0832) SpO2:  [85 %-99 %] 90 % (04/03 0800) Arterial Line BP: (64-159)/(41-72) 81/72 mmHg (04/03 0700) FiO2 (%):  [40.4 %] 40.4 % (04/02 0900) I/O last 3 completed shifts: In: 9470 [P.O.:500; I.V.:3228; IV Piggyback:520] Out: 9628 [Urine:4060; Drains:503]  Physical Examination: General:  Patient in no distress awake on ventilator Neuro:  awaken  , right intraventricular drain placed HEENT:  No jvd Neck:  Neck supple no thyromegaly   Cardiovascular:  Regular rate and rhythm without S3 normal S1-S2 Lungs:  Clear without wheeze or rhonchi Abdomen:  Soft nontender bowel sounds hypoactive no masses Musculoskeletal:  Full range of motion Skin:  Clear  Ventilator settings: Vent Mode:   [-] CPAP FiO2 (%):  [40.4 %] 40.4 % PEEP:  [0 cmH20] 0 cmH20  Labs and Imaging:  Reviewed.  Please refer to the Assessment and Plan section for relevant results.  Lab 09/19/11 0500 09/18/11 0445 09/17/11 0721  NA 142 137 139  K 3.8 4.3 --  CL 111 108 106  CO2 22 21 24   GLUCOSE 105* 120* 167*  BUN 18 18 20   CREATININE 0.97 0.84 0.90  CALCIUM 8.5 8.3* 9.3  MG -- -- --  PHOS -- -- --    Lab 09/19/11 0500 09/18/11 0445 09/17/11 0721  HGB 10.7* 11.5* 12.9*  HCT 32.2* 33.8* 38.0*  WBC 16.7* 15.6* 11.8*  PLT 240 271 292    Lab 09/17/11 1315 09/17/11 0721  AST -- 18  ALT -- 15  ALKPHOS -- 80  BILITOT -- 0.3  PROT -- 6.4  ALBUMIN -- 3.6  INR 0.97 --   Ct Head Wo Contrast  09/18/2011  *RADIOLOGY REPORT*  Clinical Data: Followup subarachnoid hemorrhage post ventriculostomy placement.  CT HEAD WITHOUT CONTRAST  Technique:  Contiguous axial images were obtained from the base of the skull through the vertex without contrast.  Comparison: 09/17/2011  Findings: Right frontal ventriculostomy has been placed with the catheter coursing through the right lateral ventricle frontal horn and the tip at the level of the right foramen of Monro/third ventricle.  Slight decrease in degree of hydrocephalus. Interventricular blood remains within the dependent aspect.  Diffuse subarachnoid hemorrhage once again noted.  Post aneurysm coiling of the right posterior communicating artery region.  Mild hypodensity midbrain may be related  to artifact rather than infarct.  Cerebellar tonsils slightly low-lying.  Medial displacement of the uncus bilaterally.  These findings are consistent with increased intracranial pressure.  Remote infarcts left cerebellum unchanged.  Exophthalmos.  IMPRESSION: Placement of a ventriculostomy with decrease in degree of hydrocephalus.  Diffuse subarachnoid hemorrhage and interventricular blood remains with evidence of increased intracranial pressure indicated by mild medial  displacement of the uncus and slight low-lying cerebellar tonsils.  Original Report Authenticated By: Doug Sou, M.D.   Ct Head Wo Contrast  09/17/2011  *RADIOLOGY REPORT*  Clinical Data: Aneurysm coiling today.  CT HEAD WITHOUT CONTRAST  Technique:  Contiguous axial images were obtained from the base of the skull through the vertex without contrast.  Comparison: CT 09/17/2011  Findings: There is diffuse subarachnoid hemorrhage in the basilar cisterns and sylvian fissure which is similar in degree.  Increased blood layering in the occipital horns bilaterally compared with the prior study.  There is developing hydrocephalus with enlargement of the ventricles since earlier today.  Hypodensity left cerebellum appears chronic.  No acute infarct or mass.  Aneurysm coil present in the region of the right posterior communicating artery.  IMPRESSION: Diffuse subarachnoid hemorrhage again noted.  Progression of blood in the ventricles and progression of hydrocephalus since earlier today.  Critical Value/emergent results were called by telephone at the time of interpretation on   09/17/2011  at 1545 hours  to  Dr. Estanislado Pandy, who verbally acknowledged these results.  Original Report Authenticated By: Truett Perna, M.D.   Dg Chest Port 1 View  09/19/2011  *RADIOLOGY REPORT*  Clinical Data: Status post extubation.  PORTABLE CHEST - 1 VIEW  Comparison: Chest x-ray 09/18/2011.  Findings: Compared to the prior examination, the previously noted endotracheal tube and nasogastric tube have been removed.  There is an increasing opacification throughout the right mid and lower hemithorax which may reflect worsening areas of atelectasis and/or consolidation in the underlying right lower lobe and right middle lobe, and it has increasing moderate right-sided pleural effusion. Left lung appears relatively clear.  Pulmonary venous engorgement, without frank pulmonary edema.  Heart size is upper limits of normal. The patient is  rotated to the right on today's exam, resulting in distortion of the mediastinal contours and reduced diagnostic sensitivity and specificity for mediastinal pathology.  IMPRESSION: 1.  Significant worsening aeration throughout the right mid and lower lung compared to the prior examination compatible with increasing areas of atelectasis and/or consolidation in the right middle and lower lobe, with overlying moderate right-sided pleural effusion.  Original Report Authenticated By: Etheleen Mayhew, M.D.   Dg Chest Port 1 View  09/18/2011  *RADIOLOGY REPORT*  Clinical Data: Check endotracheal tube.  PORTABLE CHEST - 1 VIEW  Comparison: 09/17/2011.  Findings: Endotracheal tube tip is endotracheal tube tip is 6 cm above the carina.  Nasogastric tube courses below the diaphragm.  The tip is not included on this exam.  Consolidation right lung base may represent infiltrate, atelectasis and / or pleural effusion.  The heart size within normal limits.  No gross pneumothorax.  IMPRESSION: Consolidation right base may represent shifting pleural fluid. Right base atelectasis or infiltrate not excluded.  Original Report Authenticated By: Doug Sou, M.D.   Dg Chest Port 1 View  09/17/2011  *RADIOLOGY REPORT*  Clinical Data: Endotracheal tube placement  PORTABLE CHEST - 1 VIEW  Comparison: 09/17/2011  Findings: Endotracheal tube in satisfactory position.  Interval development of right middle lobe and right lower lobe airspace disease  which could be due to pneumonia or atelectasis.  Aspiration is a possibility.  Left lung is clear.  No heart failure or effusion.  IMPRESSION: Endotracheal tube in good position.  New area of airspace disease in the right lung base which may be pneumonia.  Original Report Authenticated By: Truett Perna, M.D.    Assessment and Plan: Patient Active Hospital Problem List: Subarachnoid hemorrhage (09/17/2011)   Assessment: Subarachnoid hemorrhage grade 2 due to rupture of right posterior  to indicating aneurysm status post coiling    Plan: Per neurosurgery  -keeping volume status up due to fears of vasospasm.   Acute respiratory failure with hypoxia (09/17/2011)   Assessment: Respiratory failure with mild hypoxemia do to subarachnoid hemorrhage and altered consciousness and need for intubation for placement of coiling and need for ongoing sedation post coiling of aneurysm    Plan: resolved 4/2 with extubation  Posterior communicating artery aneurysm (09/17/2011)   Assessment: Status post coiling per neuro radiology    Plan: Per neuroradiology and neurosurgery Note intraventricular drain has been placed  Hypotension Assessment: Post procedure hypotension likely on the basis of volume depletion and sedation Plan resolved  Bradycardia Assessment: Bradycardia was present on admission and is sinus bradycardia in nature Plan Will need continued monitoring,   PCCM to remain as consult per request [place] 4/3.  Best practices / Disposition: -->ICU status under PCCM -->full code -->SCD for DVT Px -->Protonix for GI Px -->ventilator bundle -->diet>>>NPO -->family not available at present  [place] Pager [phone] till 3 pm If no answer page [phone] 09/19/2011, 8:48 AM  Patient with evidence of RLL infiltrate, likely aspiration in nature.  Will pan culture and start cefepime.  Patient seen and examined, agree with above note.  I dictated the care and orders written for this patient under my direction.  Jennet Maduro, M.D. [phone]

## 2011-09-19 NOTE — Progress Notes (Signed)
VASCULAR LAB  See original note from 09-18-11 for today's TCD results  Sean Pope 09/19/2011 4:07 PM

## 2011-09-19 NOTE — Progress Notes (Signed)
PT Discharge Note   Evaluation cancelled today due to pt still with IVC. PT/OOB ordered by CCM. Will sign off, please re-order PT when patient is appropriate.  [address] SPT  [place], [place] [phone]

## 2011-09-19 NOTE — Progress Notes (Signed)
Subjective: Patient sitting up in bed, eating breakfast. Denies headache, but does note neck stiffness. No recall for conversation in emergency room. IVC draining blood-tinged CSF. TCDs yesterday look good, we'll continue to monitor Monday Wednesday and Friday.  Objective: Vital signs in last 24 hours: Filed Vitals:   09/19/11 0500 09/19/11 0600 09/19/11 0700 09/19/11 0832  BP: 143/58 140/60 150/66 169/73  Pulse: 33 36 35   Temp:      TempSrc:      Resp: 22 20 19    Height:      Weight:      SpO2: 92% 92% 92%     Intake/Output from previous day: 04/02 0701 - 04/03 0700 In: 2339.1 [P.O.:500; I.V.:1829.1; IV Piggyback:10] Out: 1594 [Urine:3810; Drains:417] Intake/Output this shift: Total I/O In: 75 [I.V.:75] Out: 30 [Drains:30]  Physical Exam:  Awake, alert, oriented. Following commands. Speech is fluent. Pupils are equal round and reactive to light. Extraocular movements are intact. Facial movement is symmetrical. Moving all 4 extremity is well. No drift of the upper extremity. 3+ meningismus persists.  CBC  Basename 09/19/11 0500 09/18/11 0445  WBC 16.7* 15.6*  HGB 10.7* 11.5*  HCT 32.2* 33.8*  PLT 240 271   BMET  Basename 09/19/11 0500 09/18/11 0445  NA 142 137  K 3.8 4.3  CL 111 108  CO2 22 21  GLUCOSE 105* 120*  BUN 18 18  CREATININE 0.97 0.84  CALCIUM 8.5 8.3*    Assessment/Plan: Patient stable following right posterior communicating artery aneurysmal subarachnoid hemorrhage and partial coiling of aneurysm 2 days ago by Dr. Kathee Delton, and extubation yesterday morning. Laboratory stable. We'll check CT brain without in a.m. And continue to monitorTCDs.   Hosie Spangle, MD 09/19/2011, 8:36 AM

## 2011-09-19 NOTE — Progress Notes (Signed)
eLink Physician-Brief Progress Note Patient Name: Sean Pope DOB: [birth year] MRN: [id number]  Date of Service  09/19/2011   HPI/Events of Note   Hypoxemia with sleep, no resp distress  eICU Interventions  cxr now Titrate O2 to >88%   Intervention Category Major Interventions: Hypoxemia - evaluation and management  Dinero Chavira 09/19/2011, 12:20 AM

## 2011-09-19 NOTE — Progress Notes (Signed)
2 Days Post-Op  Subjective: R PCOM aneurysm coiling 4/1 Pt doing well this am He did try to get out of bed this am/maybe slight confusion ... Nursing staff able to get him back to bed and comfortable .Marland Kitchen appropriate   Objective: Vital signs in last 24 hours: Temp:  [98.1 F (36.7 C)-99.7 F (37.6 C)] 98.5 F (36.9 C) (04/03 0800) Pulse Rate:  [30-98] 74  (04/03 0925) Resp:  [15-22] 20  (04/03 0925) BP: (106-169)/(51-88) 125/60 mmHg (04/03 0900) SpO2:  [85 %-99 %] 88 % (04/03 0925) Arterial Line BP: (64-159)/(41-72) 72/68 mmHg (04/03 0925)    Intake/Output from previous day: 04/02 0701 - 04/03 0700 In: 2339.1 [P.O.:500; I.V.:1829.1; IV Piggyback:10] Out: 7510 [Urine:3810; Drains:417] Intake/Output this shift: Total I/O In: 170.8 [I.V.:170.8] Out: 1030 [Urine:1000; Drains:30]  PE:  VSS; afeb Pt sitting up in bed ventric drain intact: approx 450 cc in drain; blood tinged Face symmetrical; smile = Moves all 4s; good strength Rt groin NT; No bleeding; no hematoma Rt foot 2+ pulses    Lab Results:   Basename 09/19/11 0500 09/18/11 0445  WBC 16.7* 15.6*  HGB 10.7* 11.5*  HCT 32.2* 33.8*  PLT 240 271   BMET  Basename 09/19/11 0500 09/18/11 0445  NA 142 137  K 3.8 4.3  CL 111 108  CO2 22 21  GLUCOSE 105* 120*  BUN 18 18  CREATININE 0.97 0.84  CALCIUM 8.5 8.3*   PT/INR  Basename 09/17/11 1315  LABPROT 13.1  INR 0.97   ABG  Basename 09/17/11 1745  PHART 7.322*  HCO3 20.9    Studies/Results: Ct Head Wo Contrast  09/18/2011  *RADIOLOGY REPORT*  Clinical Data: Followup subarachnoid hemorrhage post ventriculostomy placement.  CT HEAD WITHOUT CONTRAST  Technique:  Contiguous axial images were obtained from the base of the skull through the vertex without contrast.  Comparison: 09/17/2011  Findings: Right frontal ventriculostomy has been placed with the catheter coursing through the right lateral ventricle frontal horn and the tip at the level of the right  foramen of Monro/third ventricle.  Slight decrease in degree of hydrocephalus. Interventricular blood remains within the dependent aspect.  Diffuse subarachnoid hemorrhage once again noted.  Post aneurysm coiling of the right posterior communicating artery region.  Mild hypodensity midbrain may be related to artifact rather than infarct.  Cerebellar tonsils slightly low-lying.  Medial displacement of the uncus bilaterally.  These findings are consistent with increased intracranial pressure.  Remote infarcts left cerebellum unchanged.  Exophthalmos.  IMPRESSION: Placement of a ventriculostomy with decrease in degree of hydrocephalus.  Diffuse subarachnoid hemorrhage and interventricular blood remains with evidence of increased intracranial pressure indicated by mild medial displacement of the uncus and slight low-lying cerebellar tonsils.  Original Report Authenticated By: Doug Sou, M.D.   Ct Head Wo Contrast  09/17/2011  *RADIOLOGY REPORT*  Clinical Data: Aneurysm coiling today.  CT HEAD WITHOUT CONTRAST  Technique:  Contiguous axial images were obtained from the base of the skull through the vertex without contrast.  Comparison: CT 09/17/2011  Findings: There is diffuse subarachnoid hemorrhage in the basilar cisterns and sylvian fissure which is similar in degree.  Increased blood layering in the occipital horns bilaterally compared with the prior study.  There is developing hydrocephalus with enlargement of the ventricles since earlier today.  Hypodensity left cerebellum appears chronic.  No acute infarct or mass.  Aneurysm coil present in the region of the right posterior communicating artery.  IMPRESSION: Diffuse subarachnoid hemorrhage again noted.  Progression  of blood in the ventricles and progression of hydrocephalus since earlier today.  Critical Value/emergent results were called by telephone at the time of interpretation on   09/17/2011  at 1545 hours  to  Dr. Estanislado Pandy, who verbally acknowledged  these results.  Original Report Authenticated By: Truett Perna, M.D.   Dg Chest Port 1 View  09/19/2011  *RADIOLOGY REPORT*  Clinical Data: Status post extubation.  PORTABLE CHEST - 1 VIEW  Comparison: Chest x-ray 09/18/2011.  Findings: Compared to the prior examination, the previously noted endotracheal tube and nasogastric tube have been removed.  There is an increasing opacification throughout the right mid and lower hemithorax which may reflect worsening areas of atelectasis and/or consolidation in the underlying right lower lobe and right middle lobe, and it has increasing moderate right-sided pleural effusion. Left lung appears relatively clear.  Pulmonary venous engorgement, without frank pulmonary edema.  Heart size is upper limits of normal. The patient is rotated to the right on today's exam, resulting in distortion of the mediastinal contours and reduced diagnostic sensitivity and specificity for mediastinal pathology.  IMPRESSION: 1.  Significant worsening aeration throughout the right mid and lower lung compared to the prior examination compatible with increasing areas of atelectasis and/or consolidation in the right middle and lower lobe, with overlying moderate right-sided pleural effusion.  Original Report Authenticated By: Etheleen Mayhew, M.D.   Dg Chest Port 1 View  09/18/2011  *RADIOLOGY REPORT*  Clinical Data: Check endotracheal tube.  PORTABLE CHEST - 1 VIEW  Comparison: 09/17/2011.  Findings: Endotracheal tube tip is endotracheal tube tip is 6 cm above the carina.  Nasogastric tube courses below the diaphragm.  The tip is not included on this exam.  Consolidation right lung base may represent infiltrate, atelectasis and / or pleural effusion.  The heart size within normal limits.  No gross pneumothorax.  IMPRESSION: Consolidation right base may represent shifting pleural fluid. Right base atelectasis or infiltrate not excluded.  Original Report Authenticated By: Doug Sou, M.D.     Dg Chest Port 1 View  09/17/2011  *RADIOLOGY REPORT*  Clinical Data: Endotracheal tube placement  PORTABLE CHEST - 1 VIEW  Comparison: 09/17/2011  Findings: Endotracheal tube in satisfactory position.  Interval development of right middle lobe and right lower lobe airspace disease which could be due to pneumonia or atelectasis.  Aspiration is a possibility.  Left lung is clear.  No heart failure or effusion.  IMPRESSION: Endotracheal tube in good position.  New area of airspace disease in the right lung base which may be pneumonia.  Original Report Authenticated By: Truett Perna, M.D.    Anti-infectives: Anti-infectives     Start     Dose/Rate Route Frequency Ordered Stop   09/17/11 1417   ceFAZolin (ANCEF) 1-5 GM-% IVPB     Comments: HINES, KRIS: cabinet override         09/17/11 1417 09/18/11 0229          Assessment/Plan: s/p Procedure(s) (LRB): RADIOLOGY WITH ANESTHESIA (N/A)  Rt PCOM aneurysm coil 4/1 Pt doing well Neuro intact Will report to Dr Threasa Beards A 09/19/2011

## 2011-09-19 NOTE — Consult Note (Signed)
ANTIBIOTIC CONSULT NOTE - INITIAL  Pharmacy Consult for : Cefepime Indication: New RLL infiltrate --  Rule out Asp PNA  No Known Allergies  Patient Measurements: Height: 5' 10"  (177.8 cm) Weight: 172 lb 9.9 oz (78.3 kg) IBW/kg (Calculated) : 73    Vital Signs: Temp: 99.1 F (37.3 C) (04/03 1200) Temp src: Oral (04/03 1200) BP: 140/63 mmHg (04/03 1400) Pulse Rate: 70  (04/03 1400) Intake/Output from previous day: 04/02 0701 - 04/03 0700 In: 2339.1 [P.O.:500; I.V.:1829.1; IV Piggyback:10] Out: 3254 [Urine:3810; Drains:417] Intake/Output from this shift: Total I/O In: 1295.8 [P.O.:500; I.V.:795.8] Out: 1607 [Urine:1500; Drains:107]  Labs:  [facility] 09/19/11 0500 09/18/11 0445 09/17/11 0721  WBC 16.7* 15.6* 11.8*  HGB 10.7* 11.5* 12.9*  PLT 240 271 292  LABCREA -- -- --  CREATININE 0.97 0.84 0.90   Estimated Creatinine Clearance: 77.3 ml/min (by C-G formula based on Cr of 0.97).  Microbiology: Recent Results (from the past 720 hour(s))  URINE CULTURE     Status: Normal   Collection Time   09/17/11 11:45 AM      Component Value Range Status Comment   Specimen Description URINE, CATHETERIZED   Final    Special Requests NONE   Final    Culture  Setup Time [id number]   Final    Colony Count NO GROWTH   Final    Culture NO GROWTH   Final    Report Status 09/18/2011 FINAL   Final   MRSA PCR SCREENING     Status: Normal   Collection Time   09/17/11  5:54 PM      Component Value Range Status Comment   MRSA by PCR NEGATIVE  NEGATIVE  Final    Problem List: Patient Active Problem List  Diagnoses Date Noted  . Altered mental status 09/18/2011  . Subarachnoid hemorrhage 09/17/2011  . Acute respiratory failure with hypoxia 09/17/2011  . Posterior communicating artery aneurysm 09/17/2011    Medications:  Scheduled Meds:   .  stroke: mapping our early stages of recovery book   Does not apply Once  . niMODipine  60 mg Oral Q4H   Or  . niMODipine  60 mg Per Tube Q4H    . pantoprazole (PROTONIX) IV  40 mg Intravenous QHS  . senna-docusate  1 tablet Oral BID  . senna-docusate  1 tablet Oral Once   Continuous Infusions:   . sodium chloride 125 mL/hr at 09/19/11 1400  . DISCONTD: hydrALAZINE     PRN Meds:.acetaminophen, acetaminophen, hydrALAZINE, hydrOXYzine, labetalol, morphine injection, ondansetron (ZOFRAN) IV, DISCONTD: hydrALAZINE, DISCONTD: HYDROmorphone  Assessment:  Subarachnoid hemorrhage grade 2 due to rupture of right posterior to indicating aneurysm status post coiling   New RLL infiltrate --  Rule out Asp PNA  Will begin empiric Cefepime.  Goal of Therapy:   Clinical resolution of presumed PNA  Plan:   Cefepime 2 gm IV q 12 hours.  Leno Mathes, Craig Guess, Pharm.D. 09/19/2011 2:50 PM

## 2011-09-20 ENCOUNTER — Inpatient Hospital Stay (HOSPITAL_COMMUNITY): Payer: Managed Care, Other (non HMO)

## 2011-09-20 LAB — CBC
MCH: 29.8 pg (ref 26.0–34.0)
MCV: 89.5 fL (ref 78.0–100.0)
Platelets: 232 10*3/uL (ref 150–400)
RBC: 3.82 MIL/uL — ABNORMAL LOW (ref 4.22–5.81)

## 2011-09-20 LAB — MAGNESIUM: Magnesium: 1.8 mg/dL (ref 1.5–2.5)

## 2011-09-20 LAB — BASIC METABOLIC PANEL
BUN: 17 mg/dL (ref 6–23)
CO2: 22 mEq/L (ref 19–32)
Calcium: 8.4 mg/dL (ref 8.4–10.5)
Creatinine, Ser: 0.9 mg/dL (ref 0.50–1.35)
Glucose, Bld: 128 mg/dL — ABNORMAL HIGH (ref 70–99)

## 2011-09-20 NOTE — Progress Notes (Signed)
Name: Sean Pope MRN: [id number] DOB: [birth year]    LOS: 3  PCCM PROGRESS NOTE  History of Present Illness: This is a 66 year old white male who presents with a right P-comm aneurysm with spontaneous rupture. The patient has a negative past history the exception of active smoking. After coming home from walking his dog this morning he developed acute onset headaches and nausea. Patient brought to the emergency room found to have a subarachnoid hemorrhage grade 2. Patient was taken to cerebral angiography and found to have a right P-comm aneurysm rupture. This was coiled by neuroradiology.  Postop the patient has been left on mechanical vent support and brought back to the intensive care. An intraventricular drain has been placed on neurosurgery. There was dilation of the fourth ventricle seen on CT scan. Critical care medicine was asked to help with support this patient.  Lines / Drains: IVC drain 09/17/11>>> ETT 09/17/11>>>4/2  Cultures: 4/3 uc>> 4/3 bc x 2>>  Antibiotics: 4/3 maxipime(?pul process)>>  Tests / Events:  hypoxia during night ?osa  Vital Signs: Temp:  [98.6 F (37 C)-99.6 F (37.6 C)] 98.9 F (37.2 C) (04/04 0337) Pulse Rate:  [33-95] 69  (04/04 0800) Resp:  [15-26] 19  (04/04 0800) BP: (107-169)/(55-85) 167/78 mmHg (04/04 0800) SpO2:  [87 %-95 %] 93 % (04/04 0800) Arterial Line BP: (70-77)/(65-73) 77/73 mmHg (04/03 1000) I/O last 3 completed shifts: In: 4560.8 [P.O.:500; I.V.:3820.8; Other:180; IV Piggyback:60] Out: 3664 [QIHKV:4259; Drains:571]  Physical Examination: General:  Patient in no distress awake, follows commands Neuro:  awaken  , right intraventricular drain noted HEENT:  No jvd Neck:  Neck supple no thyromegaly   Cardiovascular:  Regular rate and rhythm without S3 normal S1-S2, frequent pac's Lungs:  Clear without wheeze or rhonchi Abdomen:  Soft nontender bowel sounds hypoactive no masses Musculoskeletal:  Full range of motion Skin:   Clear  Labs and Imaging:  .  Lab 09/20/11 0400 09/19/11 0500 09/18/11 0445 09/17/11 0721  NA 138 142 137 139  K 3.7 3.8 -- --  CL 106 111 108 106  CO2 22 22 21 24   GLUCOSE 128* 105* 120* 167*  BUN 17 18 18 20   CREATININE 0.90 0.97 0.84 0.90  CALCIUM 8.4 8.5 8.3* 9.3  MG 1.8 -- -- --  PHOS 2.9 -- -- --    Lab 09/20/11 0400 09/19/11 0500 09/18/11 0445  HGB 11.4* 10.7* 11.5*  HCT 34.2* 32.2* 33.8*  WBC 16.9* 16.7* 15.6*  PLT 232 240 271    Lab 09/17/11 1315 09/17/11 0721  AST -- 18  ALT -- 15  ALKPHOS -- 80  BILITOT -- 0.3  PROT -- 6.4  ALBUMIN -- 3.6  INR 0.97 --   Ct Head Wo Contrast  09/20/2011  *RADIOLOGY REPORT*  Clinical Data: Followup subarachnoid hemorrhage.  CT HEAD WITHOUT CONTRAST  Technique:  Contiguous axial images were obtained from the base of the skull through the vertex without contrast.  Comparison: 09/18/2011.  Findings: Post aneurysm coiling and ventriculostomy placement with interval decrease in the size of the lateral ventricles.  Slight asymmetry of the lateral ventricles left larger than right. Interventricular blood remains in the dependent position. Prominent amount of subarachnoid blood asymmetric greater on the right remains.  Remote small left cerebellar infarct.  No evidence of large acute thrombotic infarct. No intracranial mass lesion detected on this unenhanced exam.  Vascular calcifications.  IMPRESSION: Interval resolution of previously noted hydrocephalus.  Remainder of findings without significant change.  Original Report Authenticated By:  Doug Sou, M.D.   Dg Chest Port 1 View  09/20/2011  *RADIOLOGY REPORT*  Clinical Data: Hypoxia.  PORTABLE CHEST - 1 VIEW  Comparison: And 09/19/2010.  Findings: Progressive consolidation right lung most notable right mid to lower lung zone.  Findings suggestive of infiltrate with underlying pulmonary vascular congestion.  No gross pneumothorax. Cardiomegaly.  Calcified aorta.  IMPRESSION: Progressive  opacification right mid to lower lung zone with underlying pulmonary vascular congestion.  Original Report Authenticated By: Doug Sou, M.D.   Dg Chest Port 1 View  09/19/2011  *RADIOLOGY REPORT*  Clinical Data: Status post extubation.  PORTABLE CHEST - 1 VIEW  Comparison: Chest x-ray 09/18/2011.  Findings: Compared to the prior examination, the previously noted endotracheal tube and nasogastric tube have been removed.  There is an increasing opacification throughout the right mid and lower hemithorax which may reflect worsening areas of atelectasis and/or consolidation in the underlying right lower lobe and right middle lobe, and it has increasing moderate right-sided pleural effusion. Left lung appears relatively clear.  Pulmonary venous engorgement, without frank pulmonary edema.  Heart size is upper limits of normal. The patient is rotated to the right on today's exam, resulting in distortion of the mediastinal contours and reduced diagnostic sensitivity and specificity for mediastinal pathology.  IMPRESSION: 1.  Significant worsening aeration throughout the right mid and lower lung compared to the prior examination compatible with increasing areas of atelectasis and/or consolidation in the right middle and lower lobe, with overlying moderate right-sided pleural effusion.  Original Report Authenticated By: Etheleen Mayhew, M.D.    Assessment and Plan: Patient Active Hospital Problem List: Subarachnoid hemorrhage (09/17/2011)   Assessment: Subarachnoid hemorrhage grade 2 due to rupture of right posterior to indicating aneurysm status post coiling    Plan: Per neurosurgery  -keeping volume status up due to fears of vasospasm.   Acute respiratory failure with hypoxia (09/17/2011)   Assessment: Respiratory failure with mild hypoxemia do to subarachnoid hemorrhage and altered consciousness and need for intubation for placement of coiling and need for ongoing sedation post coiling of aneurysm     Plan: resolved 4/2 with extubation  Posterior communicating artery aneurysm (09/17/2011)   Assessment: Status post coiling per neuro radiology    Plan: Per neuroradiology and neurosurgery Note intraventricular drain has been placed  Hypotension Assessment: Post procedure hypotension likely on the basis of volume depletion and sedation Plan resolved  Bradycardia Assessment: Bradycardia was present on admission and is sinus bradycardia in nature Plan Will need continued monitoring,   Right lung opacification 4/3  -placed on abx -mobilize as tolerated PCCM to remain as consult per request [place] 4/3.  Best practices / Disposition: -->ICU status under PCCM -->full code -->SCD for DVT Px -->Protonix for GI Px -->ventilator bundle -->diet>>>NPO -->family not available at present  [place] Pager [phone] till 3 pm If no answer page [phone] 09/20/2011, 8:20 AM  Patient seen and examined, agree with above note.  I dictated the care and orders written for this patient under my direction.  Jennet Maduro, M.D. [phone]

## 2011-09-20 NOTE — Progress Notes (Signed)
Subjective:  Patient sitting up in bed, denies headache but does have continued neck discomfort. IVC continued to drain well. TCDs show normal velocities. Patient continues on Nimotop.  Objective: Vital signs in last 24 hours: Filed Vitals:   09/20/11 0800 09/20/11 0823 09/20/11 0900 09/20/11 1000  BP: 167/78 129/54 125/67 144/74  Pulse: 69 72 61 62  Temp:  98.3 F (36.8 C)    TempSrc:  Oral    Resp: 19 20 21 23   Height:      Weight:  80.74 kg (178 lb)    SpO2: 93% 85% 89% 90%    Intake/Output from previous day: 04/03 0701 - 04/04 0700 In: 3650.8 [P.O.:500; I.V.:2920.8; IV Piggyback:50] Out: 5830 [Urine:3600; Drains:368] Intake/Output this shift: Total I/O In: 615 [P.O.:240; I.V.:375] Out: 274 [Urine:250; Drains:24]  Physical Exam:  Awake and alert, oriented. Speech is fluent, he has good comprehension. Pupils are equal round and reactive to light. Extraocular movements are intact. Facial movement is symmetrical. Moving all 4 extremity as well. 3+ meningismus persists.  CBC  Basename 09/20/11 0400 09/19/11 0500  WBC 16.9* 16.7*  HGB 11.4* 10.7*  HCT 34.2* 32.2*  PLT 232 240   BMET  Basename 09/20/11 0400 09/19/11 0500  NA 138 142  K 3.7 3.8  CL 106 111  CO2 22 22  GLUCOSE 128* 105*  BUN 17 18  CREATININE 0.90 0.97  CALCIUM 8.4 8.5    Studies/Results: Ct Head Wo Contrast  09/20/2011  *RADIOLOGY REPORT*  Clinical Data: Followup subarachnoid hemorrhage.  CT HEAD WITHOUT CONTRAST  Technique:  Contiguous axial images were obtained from the base of the skull through the vertex without contrast.  Comparison: 09/18/2011.  Findings: Post aneurysm coiling and ventriculostomy placement with interval decrease in the size of the lateral ventricles.  Slight asymmetry of the lateral ventricles left larger than right. Interventricular blood remains in the dependent position. Prominent amount of subarachnoid blood asymmetric greater on the right remains.  Remote small left  cerebellar infarct.  No evidence of large acute thrombotic infarct. No intracranial mass lesion detected on this unenhanced exam.  Vascular calcifications.  IMPRESSION: Interval resolution of previously noted hydrocephalus.  Remainder of findings without significant change.  Original Report Authenticated By: Doug Sou, M.D.   Dg Chest Port 1 View  09/20/2011  *RADIOLOGY REPORT*  Clinical Data: Hypoxia.  PORTABLE CHEST - 1 VIEW  Comparison: And 09/19/2010.  Findings: Progressive consolidation right lung most notable right mid to lower lung zone.  Findings suggestive of infiltrate with underlying pulmonary vascular congestion.  No gross pneumothorax. Cardiomegaly.  Calcified aorta.  IMPRESSION: Progressive opacification right mid to lower lung zone with underlying pulmonary vascular congestion.  Original Report Authenticated By: Doug Sou, M.D.   Dg Chest Port 1 View  09/19/2011  *RADIOLOGY REPORT*  Clinical Data: Status post extubation.  PORTABLE CHEST - 1 VIEW  Comparison: Chest x-ray 09/18/2011.  Findings: Compared to the prior examination, the previously noted endotracheal tube and nasogastric tube have been removed.  There is an increasing opacification throughout the right mid and lower hemithorax which may reflect worsening areas of atelectasis and/or consolidation in the underlying right lower lobe and right middle lobe, and it has increasing moderate right-sided pleural effusion. Left lung appears relatively clear.  Pulmonary venous engorgement, without frank pulmonary edema.  Heart size is upper limits of normal. The patient is rotated to the right on today's exam, resulting in distortion of the mediastinal contours and reduced diagnostic sensitivity and specificity for  mediastinal pathology.  IMPRESSION: 1.  Significant worsening aeration throughout the right mid and lower lung compared to the prior examination compatible with increasing areas of atelectasis and/or consolidation in the right  middle and lower lobe, with overlying moderate right-sided pleural effusion.  Original Report Authenticated By: Etheleen Mayhew, M.D.    Assessment/Plan: CT this morning shows good decompression of the ventricles but there remains moderate subarachnoid hemorrhage and intraventricular hemorrhage. Clinically patient is doing well, TCDs are good, CT scan looks good. We'll continue IVC, patient prefers to have Foley remained in, we'll continue to monitor TCDs, will check CT scan again next week.   Hosie Spangle, MD 09/20/2011, 10:45 AM

## 2011-09-21 ENCOUNTER — Inpatient Hospital Stay (HOSPITAL_COMMUNITY): Payer: Managed Care, Other (non HMO)

## 2011-09-21 LAB — LEGIONELLA ANTIGEN, URINE: Legionella Antigen, Urine: NEGATIVE

## 2011-09-21 MED ORDER — HYDROCODONE-ACETAMINOPHEN 10-325 MG PO TABS
1.0000 | ORAL_TABLET | ORAL | Status: DC | PRN
  Administered 2011-09-24: 1 via ORAL
  Administered 2011-09-27 (×2): 2 via ORAL
  Filled 2011-09-21: qty 1
  Filled 2011-09-21 (×2): qty 2

## 2011-09-21 MED ORDER — PANTOPRAZOLE SODIUM 40 MG PO TBEC
40.0000 mg | DELAYED_RELEASE_TABLET | Freq: Every day | ORAL | Status: DC
  Administered 2011-09-21 – 2011-09-30 (×9): 40 mg via ORAL
  Filled 2011-09-21 (×8): qty 1

## 2011-09-21 NOTE — Evaluation (Signed)
Physical Therapy Evaluation Patient Details Name: Sean Pope MRN: [id number] DOB: 1945-06-21 Today's Date: 09/21/2011  Problem List:  Patient Active Problem List  Diagnoses  . Subarachnoid hemorrhage  . Acute respiratory failure with hypoxia  . Posterior communicating artery aneurysm  . Altered mental status    Past Medical History: History reviewed. No pertinent past medical history. Past Surgical History: History reviewed. No pertinent past surgical history.  PT Assessment/Plan/Recommendation PT Assessment Clinical Impression Statement: Pt presents with a medical diagnosis of SAH with IVC along with medical complications including pneumonia presenting with decreased O2 with mobility. Pt will benefit from skilled PT in the acute care setting in order to increase functional mobility as well as endurance through energy conservation in order to d/c home safely. PT Recommendation/Assessment: Patient will need skilled PT in the acute care venue PT Problem List: Decreased strength;Decreased activity tolerance;Decreased mobility;Decreased knowledge of precautions Barriers to Discharge: Other (comment) (stairs) PT Plan PT Frequency: Min 4X/week PT Treatment/Interventions: DME instruction;Gait training;Stair training;Functional mobility training;Therapeutic activities;Therapeutic exercise;Patient/family education PT Recommendation Follow Up Recommendations: Other (comment) (TBD, possible HHPT pending progress) Equipment Recommended: None recommended by OT;None recommended by PT (pt and wife report they can borrow any DME they may need) PT Goals  Acute Rehab PT Goals PT Goal Formulation: With patient/family Time For Goal Achievement: 2 weeks Pt will go Supine/Side to Sit: with modified independence PT Goal: Supine/Side to Sit - Progress: Goal set today Pt will go Sit to Supine/Side: with modified independence PT Goal: Sit to Supine/Side - Progress: Goal set today Pt will go Sit to Stand:  with modified independence PT Goal: Sit to Stand - Progress: Goal set today Pt will go Stand to Sit: with modified independence PT Goal: Stand to Sit - Progress: Goal set today Pt will Transfer Bed to Chair/Chair to Bed: with supervision PT Transfer Goal: Bed to Chair/Chair to Bed - Progress: Goal set today Pt will Ambulate: >150 feet;with supervision;with least restrictive assistive device PT Goal: Ambulate - Progress: Goal set today Pt will Go Up / Down Stairs: Flight;with supervision;with rail(s) PT Goal: Up/Down Stairs - Progress: Goal set today Pt will Perform Home Exercise Program: Independently PT Goal: Perform Home Exercise Program - Progress: Goal set today  PT Evaluation Precautions/Restrictions  Precautions Precautions: Other (comment) (IVC drain) Required Braces or Orthoses: No Restrictions Weight Bearing Restrictions: No Prior Functioning  Home Living Lives With: Spouse Receives Help From: Family Type of Home: House Home Layout: Two level;Bed/bath upstairs;1/2 bath on main level (possible to be on first floor if need be) Alternate Level Stairs-Rails: Right Alternate Level Stairs-Number of Steps: 15 Home Access: Stairs to enter Entrance Stairs-Rails: Can reach both Entrance Stairs-Number of Steps: 2 Bathroom Shower/Tub: Walk-in shower;Door [employer] Toilet: Standard Bathroom Accessibility: Yes How Accessible: Accessible via walker Home Adaptive Equipment: None (possible to get equipment) Prior Function Level of Independence: Independent with basic ADLs;Independent with homemaking with ambulation;Independent with gait;Independent with transfers Able to Take Stairs?: Yes Driving: Yes Vocation: Full time employment Vocation Requirements: Nurse, learning disability Leisure: Hobbies-yes (Comment) Comments: fishing Cognition Cognition Arousal/Alertness: Awake/alert Overall Cognitive Status: Appears within functional limits for tasks assessed Orientation Level: Oriented  X4 Sensation/Coordination Sensation Light Touch: Appears Intact Coordination Gross Motor Movements are Fluid and Coordinated: Yes Fine Motor Movements are Fluid and Coordinated: Yes Extremity Assessment RUE Assessment RUE Assessment: Within Functional Limits LUE Assessment LUE Assessment: Within Functional Limits RLE Assessment RLE Assessment: Within Functional Limits LLE Assessment LLE Assessment: Within Functional Limits Mobility (including Balance) Bed Mobility Bed  Mobility: No (Pt up in chair) Transfers Transfers: Yes Sit to Stand: From chair/3-in-1;With upper extremity assist;With armrests;4: Min assist Sit to Stand Details (indicate cue type and reason): VC for hand placement. Min assist for stability and safety during transfer Stand to Sit: To chair/3-in-1;With armrests;With upper extremity assist;4: Min assist Stand to Sit Details: VC for hand placement. Pt able to control descent with min assist only Ambulation/Gait Ambulation/Gait: Yes Ambulation/Gait Assistance: 4: Min assist Ambulation/Gait Assistance Details (indicate cue type and reason): Min assist for stability throughout due to pt with slight weakness from prolonged bedrest. VC for sequencing Ambulation Distance (Feet): 25 Feet Assistive device: 1 person hand held assist Gait Pattern: Step-to pattern;Decreased stride length;Decreased hip/knee flexion - right;Decreased hip/knee flexion - left Gait velocity: decreased gait speed Stairs: No    Exercise    End of Session PT - End of Session Equipment Utilized During Treatment: Gait belt;Other (comment) (IVC clamped) Activity Tolerance: Patient limited by fatigue Patient left: in chair;with call bell in reach;with family/visitor present Nurse Communication: Mobility status for transfers;Mobility status for ambulation;Other (comment) (decreased O2) General Behavior During Session: [facility] for tasks performed Cognition: [facility] for tasks performed  Ambrose Finland 09/21/2011, 4:29 PM  09/21/2011 Ambrose Finland DPT PAGER: [phone] OFFICE: [phone]

## 2011-09-21 NOTE — Progress Notes (Signed)
Occupational Therapy Evaluation Patient Details Name: Sean Pope MRN: [id number] DOB: [birth year] Today's Date: 09/21/2011  Problem List:  Patient Active Problem List  Diagnoses  . Subarachnoid hemorrhage  . Acute respiratory failure with hypoxia  . Posterior communicating artery aneurysm  . Altered mental status    Past Medical History: History reviewed. No pertinent past medical history. Past Surgical History: History reviewed. No pertinent past surgical history.  OT Assessment/Plan/Recommendation OT Assessment Clinical Impression Statement: Pt admitted with subarachnoid hemorrhage grade 2 and was found to have a right  P-comm aneurysm rupture.   Pt now presenting with generalized weakness.  Will benefit from acute OT services to address below problem list in prep for d/c home with wife and possible HHOT pending progress. OT Recommendation/Assessment: Patient will need skilled OT in the acute care venue OT Problem List: Decreased strength;Decreased activity tolerance;Decreased knowledge of use of DME or AE OT Therapy Diagnosis : Generalized weakness OT Plan OT Frequency: Min 2X/week OT Treatment/Interventions: Self-care/ADL training;Energy conservation;DME and/or AE instruction;Therapeutic activities;Patient/family education OT Recommendation Follow Up Recommendations: Home health OT;Other (comment) (HHOT pending progress) Equipment Recommended: None recommended by OT (pt and wife report they can borrow any DME they may need) Individuals Consulted Consulted and Agree with Results and Recommendations: Patient OT Goals Acute Rehab OT Goals OT Goal Formulation: With patient Time For Goal Achievement: 2 weeks ADL Goals Pt Will Perform Grooming: with modified independence;Standing at sink;Sitting at sink ADL Goal: Grooming - Progress: Goal set today Pt Will Perform Lower Body Bathing: with modified independence;Sit to stand from chair;Sit to stand from bed ADL Goal: Lower Body  Bathing - Progress: Goal set today Pt Will Perform Lower Body Dressing: with modified independence;Sit to stand from chair;Sit to stand from bed ADL Goal: Lower Body Dressing - Progress: Goal set today Pt Will Transfer to Toilet: with modified independence;Ambulation;Regular height toilet ADL Goal: Toilet Transfer - Progress: Goal set today Pt Will Perform Tub/Shower Transfer: Shower transfer;Ambulation;Shower seat without back;with modified independence ADL Goal: Clinical cytogeneticist - Progress: Goal set today Additional ADL Goal #1: Pt will be able to verbalize and demonstrate energy conservation techniques during ADLs 75% of time. ADL Goal: Additional Goal #1 - Progress: Goal set today Miscellaneous OT Goals Miscellaneous OT Goal #1: Pt will perform bed mobitility with mod I in prep for EOB ADLs. OT Goal: Miscellaneous Goal #1 - Progress: Goal set today  OT Evaluation Precautions/Restrictions  Precautions Precautions: Other (comment) (IVC drain) Required Braces or Orthoses: No Restrictions Weight Bearing Restrictions: No Prior Functioning Home Living Lives With: Spouse Receives Help From: Family Type of Home: House Home Layout: Two level;Bed/bath upstairs;1/2 bath on main level (possible to be on first floor if need be) Alternate Level Stairs-Rails: Right Alternate Level Stairs-Number of Steps: 15 Home Access: Stairs to enter Entrance Stairs-Rails: Can reach both Entrance Stairs-Number of Steps: 2 Bathroom Shower/Tub: Walk-in shower;Door [employer] Toilet: Standard Bathroom Accessibility: Yes How Accessible: Accessible via walker Home Adaptive Equipment: None (possible to get equipment) Prior Function Level of Independence: Independent with basic ADLs;Independent with homemaking with ambulation;Independent with gait;Independent with transfers Able to Take Stairs?: Yes Driving: Yes Vocation: Full time employment Vocation Requirements: Nurse, learning disability Leisure: Hobbies-yes  (Comment) Comments: fishing ADL ADL Grooming: Simulated;Set up Grooming Details (indicate cue type and reason): Setup assist to gather grooming items. Where Assessed - Grooming: Sitting, chair Lower Body Bathing: Simulated;Supervision/safety Lower Body Bathing Details (indicate cue type and reason): Able to cross ankles over knees in order to access bil. feet. Where Assessed -  Lower Body Bathing: Sitting, chair Lower Body Dressing: Simulated;Supervision/safety Lower Body Dressing Details (indicate cue type and reason): Able to cross ankles over knees in order to access bil. feet Where Assessed - Lower Body Dressing: Sitting, chair Toilet Transfer: Simulated;Minimal assistance Toilet Transfer Details (indicate cue type and reason): Min assist for steadying Toilet Transfer Method: Ambulating Toilet Transfer Equipment: Other (comment) (chair) Equipment Used: Other (comment) (hand held assist) Ambulation Related to ADLs: RN clamped IVC prior to ambulation per MD order. Pt ambulated approx. 5 ft with hand held assist and increased time when O2 decreased to 84% on 6L nasal cannula.  Returned to chair and cued deep breathing techniques to increase O2 to 91% on 6L nasal cannula.   ADL Comments: Educated pt on using energy conservation techniques during ADL activities. Vision/Perception    Cognition Cognition Arousal/Alertness: Awake/alert Overall Cognitive Status: Appears within functional limits for tasks assessed Orientation Level: Oriented X4 Sensation/Coordination Sensation Light Touch: Appears Intact Coordination Gross Motor Movements are Fluid and Coordinated: Yes Fine Motor Movements are Fluid and Coordinated: Yes Extremity Assessment RUE Assessment RUE Assessment: Within Functional Limits LUE Assessment LUE Assessment: Within Functional Limits Mobility  Bed Mobility Bed Mobility: No (Pt up in chair) Transfers Transfers: Yes Sit to Stand: From chair/3-in-1;With upper  extremity assist;With armrests;4: Min assist Stand to Sit: To chair/3-in-1;With armrests;With upper extremity assist;4: Min assist Exercises   End of Session OT - End of Session Equipment Utilized During Treatment: Gait belt Activity Tolerance: Patient limited by fatigue;Other (comment) (decreased O2 saturation) Patient left: in chair;with call bell in reach;with family/visitor present Nurse Communication: Mobility status for transfers;Mobility status for ambulation;Other (comment) (finished with therapy so IVC  could be unclamped) General Behavior During Session: [facility] for tasks performed Cognition: [facility] for tasks performed   2:35 PM  09/21/2011 Darrol Jump OTR/L Pager [phone] Office [phone]

## 2011-09-21 NOTE — Progress Notes (Addendum)
ANTIBIOTIC CONSULT NOTE - FOLLOW UP  Pharmacy Consult for cefepime Indication: pneumonia  No Known Allergies  Patient Measurements: Height: 5' 10"  (177.8 cm) Weight: 178 lb (80.74 kg) IBW/kg (Calculated) : 73  Adjusted Body Weight:   Vital Signs: Temp: 99.2 F (37.3 C) (04/05 1000) Temp src: Oral (04/05 1000) BP: 151/72 mmHg (04/05 1300) Pulse Rate: 69  (04/05 1300) Intake/Output from previous day: 04/04 0701 - 04/05 0700 In: 3400 [P.O.:300; I.V.:3000; IV Piggyback:100] Out: 2982 [Urine:2625; Drains:357] Intake/Output from this shift: Total I/O In: 750 [I.V.:750] Out: 469 [Urine:400; Drains:69]  Labs:  Basename 09/20/11 0400 09/19/11 0500  WBC 16.9* 16.7*  HGB 11.4* 10.7*  PLT 232 240  LABCREA -- --  CREATININE 0.90 0.97   Estimated Creatinine Clearance: 83.4 ml/min (by C-G formula based on Cr of 0.9). No results found for this basename: VANCOTROUGH:2,VANCOPEAK:2,VANCORANDOM:2,GENTTROUGH:2,GENTPEAK:2,GENTRANDOM:2,TOBRATROUGH:2,TOBRAPEAK:2,TOBRARND:2,AMIKACINPEAK:2,AMIKACINTROU:2,AMIKACIN:2, in the last 72 hours   Microbiology: Recent Results (from the past 720 hour(s))  URINE CULTURE     Status: Normal   Collection Time   09/17/11 11:45 AM      Component Value Range Status Comment   Specimen Description URINE, CATHETERIZED   Final    Special Requests NONE   Final    Culture  Setup Time [id number]   Final    Colony Count NO GROWTH   Final    Culture NO GROWTH   Final    Report Status 09/18/2011 FINAL   Final   MRSA PCR SCREENING     Status: Normal   Collection Time   09/17/11  5:54 PM      Component Value Range Status Comment   MRSA by PCR NEGATIVE  NEGATIVE  Final   CULTURE, BLOOD (ROUTINE X 2)     Status: Normal (Preliminary result)   Collection Time   09/19/11  1:50 PM      Component Value Range Status Comment   Specimen Description BLOOD ARM RIGHT   Final    Special Requests BOTTLES DRAWN AEROBIC AND ANAEROBIC 10CC   Final    Culture  Setup Time  [id number]   Final    Culture     Final    Value:        BLOOD CULTURE RECEIVED NO GROWTH TO DATE CULTURE WILL BE HELD FOR 5 DAYS BEFORE ISSUING A FINAL NEGATIVE REPORT   Report Status PENDING   Incomplete   CULTURE, BLOOD (ROUTINE X 2)     Status: Normal (Preliminary result)   Collection Time   09/19/11  2:00 PM      Component Value Range Status Comment   Specimen Description BLOOD HAND RIGHT   Final    Special Requests BOTTLES DRAWN AEROBIC AND ANAEROBIC 10CC   Final    Culture  Setup Time [id number]   Final    Culture     Final    Value:        BLOOD CULTURE RECEIVED NO GROWTH TO DATE CULTURE WILL BE HELD FOR 5 DAYS BEFORE ISSUING A FINAL NEGATIVE REPORT   Report Status PENDING   Incomplete     Anti-infectives     Start     Dose/Rate Route Frequency Ordered Stop   09/19/11 1530   ceFEPIme (MAXIPIME) 2 g in dextrose 5 % 50 mL IVPB        2 g 100 mL/hr over 30 Minutes Intravenous Every 12 hours 09/19/11 1512     09/17/11 1417   ceFAZolin (ANCEF) 1-5 GM-% IVPB     Comments:  HINES, KRIS: cabinet override         09/17/11 1417 09/18/11 0229          Assessment: Patient is a 66 y.o M s/p subarachnoid hemorrhage currently on cefepime day #3 for suspected PNA. Renal function has been stable and all cultures have been negative this far  Plan:  1) continue current cefepime regimen 2) f/u with culture results 3) Patient's tolerating PO meds.  Protonix changed to PO per P&T policy.  Ghali Morissette P 09/21/2011,1:21 PM

## 2011-09-21 NOTE — Progress Notes (Signed)
Name: Sean Pope MRN: [id number] DOB: 12-Nov-1945    LOS: 4  PCCM PROGRESS NOTE  History of Present Illness: This is a 66 year old white male who presents with a right P-comm aneurysm with spontaneous rupture. The patient has a negative past history the exception of active smoking. After coming home from walking his dog this morning he developed acute onset headaches and nausea. Patient brought to the emergency room found to have a subarachnoid hemorrhage grade 2. Patient was taken to cerebral angiography and found to have a right P-comm aneurysm rupture. This was coiled by neuroradiology.  Postop the patient has been left on mechanical vent support and brought back to the intensive care. An intraventricular drain has been placed on neurosurgery. There was dilation of the fourth ventricle seen on CT scan. Critical care medicine was asked to help with support this patient.  Lines / Drains: IVC drain 09/17/11>>> ETT 09/17/11>>>4/2  Cultures: 4/3 uc>> 4/3 bc x 2>>  Antibiotics: 4/3 maxipime(?pul process)>>  Tests / Events:  hypoxia during night ?osa  Vital Signs: Temp:  [98.5 F (36.9 C)-99.6 F (37.6 C)] 99.2 F (37.3 C) (04/05 1000) Pulse Rate:  [34-82] 72  (04/05 1000) Resp:  [17-27] 24  (04/05 1000) BP: (127-162)/(56-80) 139/67 mmHg (04/05 1000) SpO2:  [85 %-96 %] 90 % (04/05 1000) I/O last 3 completed shifts: In: 5080 [P.O.:300; I.V.:4500; Other:180; IV Piggyback:100] Out: 4268 [Urine:3725; Drains:543]  Physical Examination: General:  Patient in no distress awake, follows commands Neuro:  awaken  , right intraventricular drain noted HEENT:  No jvd Neck:  Neck supple no thyromegaly   Cardiovascular:  Regular rate and rhythm without S3 normal S1-S2, frequent pac's Lungs:  Clear without wheeze or rhonchi Abdomen:  Soft nontender bowel sounds hypoactive no masses Musculoskeletal:  Full range of motion Skin:  Clear  Labs and Imaging:  .  Lab 09/20/11 0400 09/19/11 0500  09/18/11 0445 09/17/11 0721  NA 138 142 137 139  K 3.7 3.8 -- --  CL 106 111 108 106  CO2 22 22 21 24   GLUCOSE 128* 105* 120* 167*  BUN 17 18 18 20   CREATININE 0.90 0.97 0.84 0.90  CALCIUM 8.4 8.5 8.3* 9.3  MG 1.8 -- -- --  PHOS 2.9 -- -- --    Lab 09/20/11 0400 09/19/11 0500 09/18/11 0445  HGB 11.4* 10.7* 11.5*  HCT 34.2* 32.2* 33.8*  WBC 16.9* 16.7* 15.6*  PLT 232 240 271    Lab 09/17/11 1315 09/17/11 0721  AST -- 18  ALT -- 15  ALKPHOS -- 80  BILITOT -- 0.3  PROT -- 6.4  ALBUMIN -- 3.6  INR 0.97 --   Ct Head Wo Contrast  09/20/2011  *RADIOLOGY REPORT*  Clinical Data: Followup subarachnoid hemorrhage.  CT HEAD WITHOUT CONTRAST  Technique:  Contiguous axial images were obtained from the base of the skull through the vertex without contrast.  Comparison: 09/18/2011.  Findings: Post aneurysm coiling and ventriculostomy placement with interval decrease in the size of the lateral ventricles.  Slight asymmetry of the lateral ventricles left larger than right. Interventricular blood remains in the dependent position. Prominent amount of subarachnoid blood asymmetric greater on the right remains.  Remote small left cerebellar infarct.  No evidence of large acute thrombotic infarct. No intracranial mass lesion detected on this unenhanced exam.  Vascular calcifications.  IMPRESSION: Interval resolution of previously noted hydrocephalus.  Remainder of findings without significant change.  Original Report Authenticated By: Doug Sou, M.D.   Dg Chest  Port 1 View  09/21/2011  *RADIOLOGY REPORT*  Clinical Data: Postop.  Question pneumonia.  PORTABLE CHEST - 1 VIEW  Comparison: 09/20/2011  Findings: Continued airspace disease in the right lower lung, atelectasis versus pneumonia.  Probable small right effusion.  Left lung is clear.  Heart is borderline in size.  IMPRESSION: Continued right lower lobe atelectasis or infiltrate with probable right effusion.  Original Report Authenticated By:  Raelyn Number, M.D.   Dg Chest Port 1 View  09/20/2011  *RADIOLOGY REPORT*  Clinical Data: Hypoxia.  PORTABLE CHEST - 1 VIEW  Comparison: And 09/19/2010.  Findings: Progressive consolidation right lung most notable right mid to lower lung zone.  Findings suggestive of infiltrate with underlying pulmonary vascular congestion.  No gross pneumothorax. Cardiomegaly.  Calcified aorta.  IMPRESSION: Progressive opacification right mid to lower lung zone with underlying pulmonary vascular congestion.  Original Report Authenticated By: Doug Sou, M.D.    Assessment and Plan: Patient Active Hospital Problem List: Subarachnoid hemorrhage (09/17/2011)   Assessment: Subarachnoid hemorrhage grade 2 due to rupture of right posterior to indicating aneurysm status post coiling    Plan:  Per neurosurgery   Keeping volume status up due to fears of vasospasm.   Acute respiratory failure with hypoxia (09/17/2011)   Assessment: Respiratory failure with mild hypoxemia do to subarachnoid hemorrhage and altered consciousness and need for intubation for placement of coiling and need for ongoing sedation post coiling of aneurysm    Plan:  Resolved 4/2 with extubation  Posterior communicating artery aneurysm (09/17/2011)   Assessment: Status post coiling per neuro radiology    Plan:  Per neuroradiology and neurosurgery  Note intraventricular drain has been placed  Hypotension Assessment: Post procedure hypotension likely on the basis of volume depletion and sedation Plan resolved  Bradycardia Assessment: Bradycardia was present on admission and is sinus bradycardia in nature Plan Will need continued monitoring,   Right lung opacification 4/3   - Placed on Cefepime on 4/3 and cultures are still pending for a final read as of now.  - Mobilize as tolerated  PCCM to remain as consult per request [place] 4/3.  Will remain in the ICU while a drain in present, will defer to Dr. Rita Ohara with regards to duration  of that and PCCM will remain on consultation.  Jennet Maduro, M.D. [phone]

## 2011-09-21 NOTE — Progress Notes (Signed)
Subjective:  Patient sitting up in bed, eating breakfast. Denies headache. IVC continues to work well, CSF remains blood-tinged.  Objective: Vital signs in last 24 hours: Filed Vitals:   09/21/11 0500 09/21/11 0600 09/21/11 0700 09/21/11 0800  BP: 130/56 132/80 129/63 162/63  Pulse: 69 69 64 71  Temp:      TempSrc:      Resp: 21 22 22 19   Height:      Weight:      SpO2: 85% 90% 91% 94%    Intake/Output from previous day: 04/04 0701 - 04/05 0700 In: 3400 [P.O.:300; I.V.:3000; IV Piggyback:100] Out: 2982 [Urine:2625; Drains:357] Intake/Output this shift: Total I/O In: 97.9 [I.V.:97.9] Out: 15 [Drains:15]  Physical Exam:  Awake, alert, oriented. Pupils are equal round and react to light. Extraocular movements intact. Facial movement symmetrical. Moving all 4 extremities well. No drift of upper extremities. 2-3+ meningismus (slightly more able to flex neck passively).   Assessment/Plan: We'll begin out of bed to chair to progress to ambulation in ICU, have discussed thoroughly with nursing staff. IVC be clamped for transfer and ambulation,, but to be open while sitting up in chair. IV to be for ambulation. For TCD today. We'll DC Foley. We'll check labs Monday a.m.   Hosie Spangle, MD 09/21/2011, 8:21 AM

## 2011-09-22 ENCOUNTER — Inpatient Hospital Stay (HOSPITAL_COMMUNITY): Payer: Managed Care, Other (non HMO)

## 2011-09-22 LAB — BASIC METABOLIC PANEL
CO2: 23 mEq/L (ref 19–32)
Calcium: 8.6 mg/dL (ref 8.4–10.5)
Chloride: 108 mEq/L (ref 96–112)
Potassium: 3.5 mEq/L (ref 3.5–5.1)
Sodium: 140 mEq/L (ref 135–145)

## 2011-09-22 LAB — PHOSPHORUS: Phosphorus: 3.1 mg/dL (ref 2.3–4.6)

## 2011-09-22 LAB — CBC
MCH: 30 pg (ref 26.0–34.0)
Platelets: 276 10*3/uL (ref 150–400)
RBC: 3.77 MIL/uL — ABNORMAL LOW (ref 4.22–5.81)
WBC: 12.6 10*3/uL — ABNORMAL HIGH (ref 4.0–10.5)

## 2011-09-22 LAB — MAGNESIUM: Magnesium: 1.8 mg/dL (ref 1.5–2.5)

## 2011-09-22 MED ORDER — BIOTENE DRY MOUTH MT LIQD
15.0000 mL | Freq: Two times a day (BID) | OROMUCOSAL | Status: DC
  Administered 2011-09-22 – 2011-10-01 (×15): 15 mL via OROMUCOSAL

## 2011-09-22 MED ORDER — DM-GUAIFENESIN ER 30-600 MG PO TB12
1.0000 | ORAL_TABLET | Freq: Two times a day (BID) | ORAL | Status: DC
  Administered 2011-09-22 – 2011-10-01 (×18): 1 via ORAL
  Filled 2011-09-22 (×20): qty 1

## 2011-09-22 MED ORDER — FUROSEMIDE 10 MG/ML IJ SOLN
20.0000 mg | Freq: Once | INTRAMUSCULAR | Status: AC
  Administered 2011-09-22: 20 mg via INTRAVENOUS
  Filled 2011-09-22: qty 2

## 2011-09-22 MED ORDER — POTASSIUM CHLORIDE 20 MEQ/15ML (10%) PO LIQD
40.0000 meq | Freq: Three times a day (TID) | ORAL | Status: AC
  Administered 2011-09-22 (×3): 40 meq
  Filled 2011-09-22 (×3): qty 30

## 2011-09-22 NOTE — Progress Notes (Signed)
Occupational Therapy Treatment Patient Details Name: Sean Pope MRN: [id number] DOB: May 10, 1946 Today's Date: 09/22/2011  OT Assessment/Plan OT Assessment/Plan Comments on Treatment Session: Pt tolerating increased activity today but continues to have difficulty keeping SpO2 from dropping. Pt typically with SpO2 = 86-88% on 2L [place] O2 during ambulation however drops to 84% with fatigue. With rest pt able to resaturate. Discussed energy conservation techniques in the house and need for slow progression of activity.   OT Plan: Discharge plan remains appropriate OT Frequency: Min 2X/week Follow Up Recommendations: Home health OT;Other (comment) Equipment Recommended: None recommended by OT;None recommended by PT OT Goals Acute Rehab OT Goals OT Goal Formulation: With patient Time For Goal Achievement: 2 weeks ADL Goals Pt Will Transfer to Toilet: with modified independence;Ambulation;Regular height toilet ADL Goal: Toilet Transfer - Progress: Progressing toward goals Additional ADL Goal #1: Pt will be able to verbalize and demonstrate energy conservation techniques during ADLs 75% of time. ADL Goal: Additional Goal #1 - Progress: Progressing toward goals  OT Treatment Precautions/Restrictions  Precautions Precautions: Other (comment) (IVC drain) Required Braces or Orthoses: No Restrictions Weight Bearing Restrictions: No   ADL ADL Upper Body Dressing: Performed;Set up Upper Body Dressing Details (indicate cue type and reason): setup to snap sleeve around IV line Where Assessed - Upper Body Dressing: Sitting, chair Toilet Transfer: Simulated;Minimal assistance Toilet Transfer Details (indicate cue type and reason): Min assist for steadying. Continous cueing for deep breathing technique Toilet Transfer Method: Counselling psychologist: Other (comment) (chair) Equipment Used: Other (comment) (pt with hand on IV pole for steadying) Ambulation Related to ADLs: RN clamped  IVC prior to ambulation through room and unit.Min assist for stability throughout due to pt with slight weakness. Repeated verbal cues for pursed lip breathing to improve SpO2 during ambulation. Pt tyipcally saturating around 86-88% SpO2 however with fatigue drops to 84% at which time pt allow seated restbreak.   ADL Comments: Educated pt on incorporating energy conservation techniques during ADLs at home.  Recommended use of shower chair (wife can borrow from friend) for tub during showers.  Will provide handout during next session. Mobility  Bed Mobility Bed Mobility: No (Pt up in chair) Transfers Sit to Stand: From chair/3-in-1;With upper extremity assist;With armrests;Other (comment) (min-guard assist) Sit to Stand Details (indicate cue type and reason): Min assist for stability and safety during transfer, assist for negotiation of lines. Stand to Sit: To chair/3-in-1;With armrests;With upper extremity assist;4: Min assist Stand to Sit Details: VC for hand placement. Pt able to control descent with min assist only. Exercises    End of Session OT - End of Session Equipment Utilized During Treatment: Gait belt;Other (comment) (oxygen tank) Activity Tolerance: Patient limited by fatigue Patient left: in chair;with call bell in reach Nurse Communication: Mobility status for transfers;Mobility status for ambulation General Behavior During Session: [facility] for tasks performed Cognition: [facility] for tasks performed   12:45 PM 09/22/2011 Darrol Jump OTR/L Pager [phone] Office [phone]

## 2011-09-22 NOTE — Progress Notes (Signed)
Physical Therapy Treatment Patient Details Name: Sean Pope MRN: [id number] DOB: 1946-05-12 Today's Date: 09/22/2011  PT Assessment/Plan  PT - Assessment/Plan Comments on Treatment Session: Pt admitted with a medical diagnosis of SAH treated with IVC along. Pt also with medical complications of pneumonia. Pt amble to mobilize with increasing distance today, continues to have difficulty keeping SpO2 from dropping. Pt typically with SpO2 = 86-88% on 2L [place] O2 during ambulation however drops to 84% with fatigue. With rest pt able to resaturate. Discussed energy conservation techniques in the house and need for slow progression of activity.  PT Plan: Discharge plan needs to be updated PT Frequency: Min 4X/week Follow Up Recommendations: Home health PT;Supervision/Assistance - 24 hour Equipment Recommended:  (pt and wife report they can borrow any DME they may need) recommend they get shower chair PT Goals  Acute Rehab PT Goals Pt will go Sit to Stand: with modified independence PT Goal: Sit to Stand - Progress: Progressing toward goal Pt will go Stand to Sit: with modified independence PT Goal: Stand to Sit - Progress: Progressing toward goal Pt will Transfer Bed to Chair/Chair to Bed: with supervision PT Transfer Goal: Bed to Chair/Chair to Bed - Progress: Progressing toward goal Pt will Ambulate: >150 feet;with supervision;with least restrictive assistive device PT Goal: Ambulate - Progress: Progressing toward goal  PT Treatment Precautions/Restrictions  Precautions Precautions: Other (comment) (IVC drain) Required Braces or Orthoses: No Restrictions Weight Bearing Restrictions: No Mobility (including Balance) Bed Mobility Bed Mobility: No (Pt up in chair) Transfers Transfers: Yes Sit to Stand: From chair/3-in-1;With upper extremity assist;With armrests;Other (comment) (min-guard assist) Sit to Stand Details (indicate cue type and reason): Min assist for stability and safety during  transfer, assist for negotiation of lines. Stand to Sit: To chair/3-in-1;With armrests;With upper extremity assist;4: Min assist Stand to Sit Details: VC for hand placement. Pt able to control descent with min assist only. Ambulation/Gait Ambulation/Gait: Yes Ambulation/Gait Assistance: 4: Min assist Ambulation/Gait Assistance Details (indicate cue type and reason): Min assist for stability throughout due to pt with slight weakness. Repeated verbal cues for pursed lip breathing to improve SpO2 during ambulation. Pt tyipcally saturating around 86-88% SpO2 however with fatigue drops to 84% at which time pt allow seated restbreak.  Ambulation Distance (Feet): 150 Feet (with one seated rest, one standing rest) Assistive device: 1 person hand held assist Gait Pattern: Decreased stride length;Decreased hip/knee flexion - right;Decreased hip/knee flexion - left;Step-through pattern (Decreased foot clearance bilaterally) Gait velocity: decreased gait speed Stairs: No    End of Session PT - End of Session Equipment Utilized During Treatment: Gait belt;Other (comment) (IVC clamped, O2) Activity Tolerance: Patient limited by fatigue;Other (comment) (desaturation) Patient left: in chair;with call bell in reach;with family/visitor present Nurse Communication: Mobility status for transfers;Mobility status for ambulation;Other (comment) (decreased O2) General Behavior During Session: [facility] for tasks performed Cognition: [facility] for tasks performed  L[place] Rocker 09/22/2011, 10:03 AM  Orlean Bradford) Oswaldo Conroy PT, DPT Acute Rehabilitation [phone]

## 2011-09-22 NOTE — Progress Notes (Signed)
Overall doing well. Patient denies headache. Bright and conversant today.  Patient has low-grade fever. Blood pressure slightly elevated which is our current target. Fluid balance positive. Ventriculostomy output is lessening. Sodium 140. Hematocrit 33.6. On exam: Patient is awake and alert. He is oriented and appropriate. Cranial nerve function is intact. Motor and sensory function extremities is normal.  Overall doing very well. Plan for followup scan on Monday and possible clamping of the ventriculostomy at that time if he continues to require less drainage.

## 2011-09-22 NOTE — Progress Notes (Signed)
Name: Sean Pope MRN: [id number] DOB: 1946/01/29    LOS: 5  PCCM PROGRESS NOTE  History of Present Illness: This is a 66 year old white male who presents with a right P-comm aneurysm with spontaneous rupture. The patient has a negative past history the exception of active smoking. After coming home from walking his dog this morning he developed acute onset headaches and nausea. Patient brought to the emergency room found to have a subarachnoid hemorrhage grade 2. Patient was taken to cerebral angiography and found to have a right P-comm aneurysm rupture. This was coiled by neuroradiology.  Postop the patient has been left on mechanical vent support and brought back to the intensive care. An intraventricular drain has been placed on neurosurgery. There was dilation of the fourth ventricle seen on CT scan. Critical care medicine was asked to help with support this patient.  Lines / Drains: IVC drain 09/17/11>>> ETT 09/17/11>>>4/2  Cultures: 4/3 uc>>Neg 4/3 bc x 2>>neg 4/3 sputum >>> neg  Antibiotics: 4/3 maxipime(asp PNA)>>  Tests / Events:  hypoxia during night ?osa  Vital Signs: Temp:  [98.6 F (37 C)-101.2 F (38.4 C)] 100.4 F (38 C) (04/06 0700) Pulse Rate:  [32-77] 33  (04/06 0900) Resp:  [17-25] 20  (04/06 0830) BP: (125-181)/(55-105) 129/58 mmHg (04/06 0900) SpO2:  [84 %-97 %] 92 % (04/06 0900)   Intake/Output Summary (Last 24 hours) at 09/22/11 1003 Last data filed at 09/22/11 0900  Gross per 24 hour  Intake   3350 ml  Output   2561 ml  Net    789 ml   Physical Examination: General:  Patient in no distress awake, follows commands Neuro:  awaken  , right intraventricular drain noted HEENT:  No jvd Neck:  Neck supple no thyromegaly   Cardiovascular:  Regular rate and rhythm without S3 normal S1-S2, frequent pac's Lungs:  Clear without wheeze or rhonchi Abdomen:  Soft nontender bowel sounds hypoactive no masses Musculoskeletal:  Full range of motion Skin:   Clear  Labs and Imaging:  .  Lab 09/22/11 0600 09/20/11 0400 09/19/11 0500 09/18/11 0445 09/17/11 0721  NA 140 138 142 137 139  K 3.5 3.7 -- -- --  CL 108 106 111 108 106  CO2 23 22 22 21 24   GLUCOSE 108* 128* 105* 120* 167*  BUN 15 17 18 18 20   CREATININE 0.82 0.90 0.97 0.84 0.90  CALCIUM 8.6 8.4 8.5 8.3* 9.3  MG 1.8 1.8 -- -- --  PHOS 3.1 2.9 -- -- --    Lab 09/22/11 0600 09/20/11 0400 09/19/11 0500  HGB 11.3* 11.4* 10.7*  HCT 33.6* 34.2* 32.2*  WBC 12.6* 16.9* 16.7*  PLT 276 232 240    Lab 09/17/11 1315 09/17/11 0721  AST -- 18  ALT -- 15  ALKPHOS -- 80  BILITOT -- 0.3  PROT -- 6.4  ALBUMIN -- 3.6  INR 0.97 --   Dg Chest Port 1 View  09/22/2011  *RADIOLOGY REPORT*  Clinical Data: 66 year old male with shortness of breath, right lung infiltrate, subarachnoid hemorrhage.  PORTABLE CHEST - 1 VIEW  Comparison: 09/21/2011 and earlier.  Findings: AP portable semi upright view at 0658 hours.  Moderate to right pleural effusion persists obscuring the right lung base. There may also be a degree of volume loss in the right lung with slight rightward midline shift.  Stable cardiac size and mediastinal contours.  Visualized tracheal air column is within normal limits.  No pneumothorax or pulmonary edema.  The left lung remains  grossly clear.  IMPRESSION: Continued opacity in the right lower lung felt to represent a combination of atelectasis and effusion.  Original Report Authenticated By: Randall An, M.D.   Dg Chest Port 1 View  09/21/2011  *RADIOLOGY REPORT*  Clinical Data: Postop.  Question pneumonia.  PORTABLE CHEST - 1 VIEW  Comparison: 09/20/2011  Findings: Continued airspace disease in the right lower lung, atelectasis versus pneumonia.  Probable small right effusion.  Left lung is clear.  Heart is borderline in size.  IMPRESSION: Continued right lower lobe atelectasis or infiltrate with probable right effusion.  Original Report Authenticated By: Raelyn Number, M.D.     Assessment and Plan: Patient Active Hospital Problem List: Subarachnoid hemorrhage (09/17/2011)   Assessment: Subarachnoid hemorrhage grade 2 due to rupture of right posterior to indicating aneurysm status post coiling    Plan:  Per neurosurgery   Single dose of lasix today to assist with hypoxemia.   Acute respiratory failure with hypoxia (09/17/2011)   Assessment: Respiratory failure with mild hypoxemia do to subarachnoid hemorrhage and altered consciousness and need for intubation for placement of coiling and need for ongoing sedation post coiling of aneurysm    Plan:  Resolved 4/2 with extubation  Posterior communicating artery aneurysm (09/17/2011)   Assessment: Status post coiling per neuro radiology    Plan:  Per neuroradiology and neurosurgery  Note intraventricular drain has been placed  Hypotension Assessment: Post procedure hypotension likely on the basis of volume depletion and sedation Plan resolved  Bradycardia Assessment: Bradycardia was present on admission and is sinus bradycardia in nature Plan Will need continued monitoring,   Right lung opacification 4/3 likely aspiration PNA now with progressive hypoxemia.  - Placed on Cefepime on 4/3 and cultures are all negative.  - Mobilize as tolerated  - Maintain on cefepime.  - 20 mg single dose of lasix.  - Mucinex for cough as it is increasing his head pain with coughing and secretions are thick.  PCCM to remain as consult per request [place] 4/3.  Will remain in the ICU while a drain in present, will defer to Dr. Rita Ohara with regards to duration of that and PCCM will remain on consultation.  Jennet Maduro, M.D. [phone]

## 2011-09-23 LAB — BASIC METABOLIC PANEL
GFR calc Af Amer: >90 mL/min (ref >90)
GFR calc non Af Amer: 90 mL/min — ABNORMAL LOW (ref >90)
Glucose, Bld: 104 mg/dL — ABNORMAL HIGH (ref 70–99)
Potassium: 4.4 mEq/L (ref 3.5–5.1)
Sodium: 136 mEq/L (ref 135–145)

## 2011-09-23 LAB — CBC
Hemoglobin: 10.3 g/dL — ABNORMAL LOW (ref 13.0–17.0)
MCHC: 33.8 g/dL (ref 30.0–36.0)
RDW: 14.1 % (ref 11.5–15.5)

## 2011-09-23 LAB — PHOSPHORUS: Phosphorus: 3.2 mg/dL (ref 2.3–4.6)

## 2011-09-23 NOTE — Plan of Care (Signed)
Problem: Discharge Progression Outcomes Goal: Barriers To Progression Addressed/Resolved Previous entry charted on wrong patient. Goal: GI consult for PEG if dysphagia persists Previous entry charted on wrong patient.

## 2011-09-23 NOTE — Progress Notes (Signed)
Name: Sean Pope MRN: [id number] DOB: Aug 11, 1945    LOS: 6  PCCM PROGRESS NOTE  History of Present Illness: This is a 66 year old white male who presents with a right P-comm aneurysm with spontaneous rupture. The patient has a negative past history the exception of active smoking. After coming home from walking his dog this morning he developed acute onset headaches and nausea. Patient brought to the emergency room found to have a subarachnoid hemorrhage grade 2. Patient was taken to cerebral angiography and found to have a right P-comm aneurysm rupture. This was coiled by neuroradiology.  Postop the patient has been left on mechanical vent support and brought back to the intensive care. An intraventricular drain has been placed on neurosurgery. There was dilation of the fourth ventricle seen on CT scan. Critical care medicine was asked to help with support this patient.  Lines / Drains: IVC drain 09/17/11>>> ETT 09/17/11>>>4/2  Cultures: 4/3 uc>>Neg 4/3 bc x 2>>neg 4/3 sputum >>> neg  Antibiotics: 4/3 maxipime(asp PNA)>>  Tests / Events: Up in chair, decreased dyspnea   Vital Signs: Temp:  [98.2 F (36.8 C)-99.7 F (37.6 C)] 98.2 F (36.8 C) (04/07 0400) Pulse Rate:  [33-79] 34  (04/07 0700) Resp:  [18-28] 22  (04/07 0700) BP: (118-181)/(55-95) 150/61 mmHg (04/07 0700) SpO2:  [84 %-98 %] 96 % (04/07 0700) Weight:  [82.2 kg (181 lb 3.5 oz)] 82.2 kg (181 lb 3.5 oz) (04/07 0400)   Intake/Output Summary (Last 24 hours) at 09/23/11 0754 Last data filed at 09/23/11 0700  Gross per 24 hour  Intake 3721.59 ml  Output   2696 ml  Net 1025.59 ml   Physical Examination: General:  Patient in no distress awake, follows commands Neuro:  awaken  , right intraventricular drain noted HEENT:  No jvd Neck:  Neck supple no thyromegaly   Cardiovascular:  Regular rate and rhythm without S3 normal S1-S2, frequent pac's Lungs:  Clear without wheeze or rhonchi Abdomen:  Soft nontender bowel  sounds hypoactive no masses Musculoskeletal:  Full range of motion Skin:  Clear  Labs and Imaging:  .  Lab 09/23/11 0355 09/22/11 0600 09/20/11 0400 09/19/11 0500 09/18/11 0445  NA 136 140 138 142 137  K 4.4 3.5 -- -- --  CL 105 108 106 111 108  CO2 22 23 22 22 21   GLUCOSE 104* 108* 128* 105* 120*  BUN 19 15 17 18 18   CREATININE 0.83 0.82 0.90 0.97 0.84  CALCIUM 8.5 8.6 8.4 8.5 8.3*  MG 1.9 1.8 1.8 -- --  PHOS 3.2 3.1 2.9 -- --    Lab 09/23/11 0355 09/22/11 0600 09/20/11 0400  HGB 10.3* 11.3* 11.4*  HCT 30.5* 33.6* 34.2*  WBC 12.9* 12.6* 16.9*  PLT 283 276 232    Lab 09/17/11 1315 09/17/11 0721  AST -- 18  ALT -- 15  ALKPHOS -- 80  BILITOT -- 0.3  PROT -- 6.4  ALBUMIN -- 3.6  INR 0.97 --   Dg Chest Port 1 View  09/22/2011  *RADIOLOGY REPORT*  Clinical Data: 65 year old male with shortness of breath, right lung infiltrate, subarachnoid hemorrhage.  PORTABLE CHEST - 1 VIEW  Comparison: 09/21/2011 and earlier.  Findings: AP portable semi upright view at 0658 hours.  Moderate to right pleural effusion persists obscuring the right lung base. There may also be a degree of volume loss in the right lung with slight rightward midline shift.  Stable cardiac size and mediastinal contours.  Visualized tracheal air column is within normal  limits.  No pneumothorax or pulmonary edema.  The left lung remains grossly clear.  IMPRESSION: Continued opacity in the right lower lung felt to represent a combination of atelectasis and effusion.  Original Report Authenticated By: Randall An, M.D.    Assessment and Plan: Patient Active Hospital Problem List: Subarachnoid hemorrhage (09/17/2011)   Assessment: Subarachnoid hemorrhage grade 2 due to rupture of right posterior to indicating aneurysm status post coiling    Plan:  Per neurosurgery   Acute respiratory failure with hypoxia (09/17/2011)   Assessment: Respiratory failure with mild hypoxemia do to subarachnoid hemorrhage and altered  consciousness and need for intubation for placement of coiling and need for ongoing sedation post coiling of aneurysm  Plan:  Resolved 4/2 with extubation  Mobilization, IS   Posterior communicating artery aneurysm (09/17/2011) Assessment: Status post coiling per neuro radiology  Plan:  Per neuroradiology and neurosurgery  Note intraventricular drain has been placed             IVF per NS   Hypotension Assessment: Post procedure hypotension likely on the basis of volume depletion and sedation Plan Resolved  Bradycardia Assessment: Bradycardia was present on admission and is sinus bradycardia in nature Plan Will need continued monitoring,   Right lung opacification 4/3 likely aspiration PNA now with progressive hypoxemia.  - Placed on Cefepime on 4/3 and cultures are all negative, once ready to leave the ICU will change to PO augmentin and treat for a total abx days of 10 days.  - Mobilize as tolerated  - Maintain on cefepime.  - Mucinex for cough as it is increasing his head pain with coughing and secretions are thick.  PCCM to remain as consult per request [place] 4/3.  Will remain in the ICU while a drain in present, will defer to Dr. Rita Ohara with regards to duration of that and PCCM will remain on consultation.  PARRETT,TAMMY, NP -C   Maintain on cefepime while in the ICU and with a drain.  No diureses to avoid vasospasm until ok with neurosurgery.  Eventually will need low dose diureses as he is positive fluid wise tremendously but as of now is holding his sats ok.  Patient seen and examined, agree with above note.  I dictated the care and orders written for this patient under my direction.  Jennet Maduro, M.D. [phone]

## 2011-09-23 NOTE — Progress Notes (Signed)
Overall looks very good. Patient Bright and awake with minimal complaints of headaches. No symptoms of numbness paresthesias or weakness.  Patient is afebrile. Vitals are stable with continuing bradycardia and mild hypertension. Ventricular output steady at around 10 cc per hour. Fluid still blood-tinged but otherwise clear. Neurologically he is awake and alert oriented and appropriate. CranialNerve function is intact. Motor and sensory function extremities is normal. There is no evidence of pronator drift.  Overall stable. Plan for followup head CT scan in the morning. Decision regarding weaning a ventriculostomy based on the CT scan. Continue IV fluids and other supportive efforts.

## 2011-09-23 NOTE — Plan of Care (Signed)
Problem: Discharge Progression Outcomes Goal: Barriers To Progression Addressed/Resolved Outcome: Progressing 4/7: Family met to with palliative care do discuss plan of care. No outcomes determined at this point. Goal: GI consult for PEG if dysphagia persists Outcome: Progressing 4/7: PEG tube will be placed by IR on 4/8. Consent signed.

## 2011-09-24 ENCOUNTER — Inpatient Hospital Stay (HOSPITAL_COMMUNITY): Payer: Managed Care, Other (non HMO)

## 2011-09-24 LAB — DIFFERENTIAL
Basophils Absolute: 0.1 10*3/uL (ref 0.0–0.1)
Basophils Relative: 1 % (ref 0–1)
Eosinophils Absolute: 0.4 10*3/uL (ref 0.0–0.7)
Eosinophils Relative: 4 % (ref 0–5)
Lymphocytes Relative: 20 % (ref 12–46)
Lymphs Abs: 2.2 10*3/uL (ref 0.7–4.0)
Monocytes Absolute: 1.8 10*3/uL — ABNORMAL HIGH (ref 0.1–1.0)
Monocytes Relative: 16 % — ABNORMAL HIGH (ref 3–12)
Neutro Abs: 6.8 10*3/uL (ref 1.7–7.7)
Neutrophils Relative %: 60 % (ref 43–77)

## 2011-09-24 LAB — CBC
HCT: 31.6 % — ABNORMAL LOW (ref 39.0–52.0)
Hemoglobin: 10.4 g/dL — ABNORMAL LOW (ref 13.0–17.0)
MCH: 29.6 pg (ref 26.0–34.0)
MCHC: 32.9 g/dL (ref 30.0–36.0)
MCV: 90 fL (ref 78.0–100.0)
Platelets: 318 10*3/uL (ref 150–400)
RBC: 3.51 MIL/uL — ABNORMAL LOW (ref 4.22–5.81)
RDW: 14 % (ref 11.5–15.5)
WBC: 11.3 10*3/uL — ABNORMAL HIGH (ref 4.0–10.5)

## 2011-09-24 LAB — BASIC METABOLIC PANEL
BUN: 20 mg/dL (ref 6–23)
CO2: 22 mEq/L (ref 19–32)
Calcium: 8.5 mg/dL (ref 8.4–10.5)
Chloride: 104 mEq/L (ref 96–112)
Creatinine, Ser: 0.82 mg/dL (ref 0.50–1.35)
GFR calc Af Amer: >90 mL/min (ref >90)
GFR calc non Af Amer: >90 mL/min (ref >90)
Glucose, Bld: 96 mg/dL (ref 70–99)
Potassium: 4.2 mEq/L (ref 3.5–5.1)
Sodium: 134 mEq/L — ABNORMAL LOW (ref 135–145)

## 2011-09-24 MED ORDER — ALBUTEROL SULFATE (5 MG/ML) 0.5% IN NEBU
INHALATION_SOLUTION | RESPIRATORY_TRACT | Status: AC
  Administered 2011-09-24: 2.5 mg via RESPIRATORY_TRACT
  Filled 2011-09-24: qty 0.5

## 2011-09-24 MED ORDER — ACETYLCYSTEINE 20 % IN SOLN
4.0000 mL | Freq: Two times a day (BID) | RESPIRATORY_TRACT | Status: AC
  Administered 2011-09-24 – 2011-09-26 (×3): 4 mL via RESPIRATORY_TRACT
  Filled 2011-09-24 (×5): qty 4

## 2011-09-24 MED ORDER — ALBUTEROL SULFATE (5 MG/ML) 0.5% IN NEBU
2.5000 mg | INHALATION_SOLUTION | Freq: Two times a day (BID) | RESPIRATORY_TRACT | Status: DC
  Administered 2011-09-24 – 2011-09-30 (×13): 2.5 mg via RESPIRATORY_TRACT
  Filled 2011-09-24 (×15): qty 0.5

## 2011-09-24 NOTE — Progress Notes (Signed)
Occupational Therapy Treatment Patient Details Name: Sean Pope MRN: [id number] DOB: 1946/05/30 Today's Date: 09/24/2011  OT Assessment/Plan OT Assessment/Plan Equipment Recommended: None recommended by OT OT Goals ADL Goals Pt Will Perform Grooming: with modified independence;Standing at sink;Sitting at sink ADL Goal: Grooming - Progress: Progressing toward goals Pt Will Perform Lower Body Bathing: with modified independence;Sit to stand from chair;Sit to stand from bed ADL Goal: Lower Body Bathing - Progress: Progressing toward goals Pt Will Perform Lower Body Dressing: with modified independence;Sit to stand from chair;Sit to stand from bed ADL Goal: Lower Body Dressing - Progress: Progressing toward goals Pt Will Transfer to Toilet: with modified independence;Ambulation;Regular height toilet ADL Goal: Toilet Transfer - Progress: Progressing toward goals Pt Will Perform Tub/Shower Transfer: Shower transfer;Ambulation;Shower seat without back;with modified independence Additional ADL Goal #1: Pt will be able to verbalize and demonstrate energy conservation techniques during ADLs 75% of time. ADL Goal: Additional Goal #1 - Progress: Progressing toward goals  OT Treatment Precautions/Restrictions  Precautions Precautions: Other (comment) ((IVC drain- needs to be clamped for gait) Required Braces or Orthoses: No Restrictions Weight Bearing Restrictions: No   ADL ADL Grooming: Performed;Wash/dry hands;Other (comment) (min guard (A)) Where Assessed - Grooming: Standing at sink Lower Body Dressing: Simulated;Supervision/safety (pt able to cross BIL LE sitting supported) Toilet Transfer: Chartered loss adjuster Method: Counselling psychologist: Regular height toilet;Grab bars Toileting - Clothing Manipulation: Performed;Supervision/safety Where Assessed - Toileting Clothing Manipulation: Sit to stand from 3-in-1 or toilet Toileting - Hygiene:  Performed;Supervision/safety Where Assessed - Toileting Hygiene: Sit to stand from 3-in-1 or toilet Equipment Used:  (holding onto the IV pole) Ambulation Related to ADLs: Pt ambulating Min guard (A) - (A) mainly due to lines and leads ADL Comments: Pt demonstrated decreased O2 levels to 83% with OT session of toilet transfer. Pt provided handout with education on energy conservation.  Mobility  Bed Mobility Bed Mobility: No Transfers Transfers: Yes Sit to Stand: 6: Modified independent (Device/Increase time);From chair/3-in-1;From toilet;With upper extremity assist Sit to Stand Details (indicate cue type and reason): min guard (A) with static standing  Stand to Sit: 6: Modified independent (Device/Increase time);To chair/3-in-1;To toilet;With armrests Exercises  End of Session OT - End of Session Equipment Utilized During Treatment: Gait belt Activity Tolerance: Patient limited by fatigue (decreased oxygen pt states "Just makes me tired") Patient left: in chair;with call bell in reach Nurse Communication: Mobility status for transfers;Mobility status for ambulation General Behavior During Session: [facility] for tasks performed Cognition: [facility] for tasks performed  Veneda Melter  09/24/2011, 2:11 PM Pager: [phone]

## 2011-09-24 NOTE — Plan of Care (Signed)
Problem: Discharge Progression Outcomes Goal: Activity appropriate for discharge plan Outcome: Progressing Oxygen level decreased to 83% during OT session

## 2011-09-24 NOTE — Progress Notes (Signed)
Name: Nadir Vasques MRN: [id number] DOB: [birth year]    LOS: 7  PCCM PROGRESS NOTE  History of Present Illness: This is a 66 year old white male who presents with a right P-comm aneurysm with spontaneous rupture. The patient has a negative past history the exception of active smoking. After coming home from walking his dog this morning he developed acute onset headaches and nausea. Patient brought to the emergency room found to have a subarachnoid hemorrhage grade 2. Patient was taken to cerebral angiography and found to have a right P-comm aneurysm rupture. This was coiled by neuroradiology.  Postop the patient has been left on mechanical vent support and brought back to the intensive care. An intraventricular drain has been placed on neurosurgery. There was dilation of the fourth ventricle seen on CT scan. Critical care medicine was asked to help with support this patient.  Lines / Drains: IVC drain 09/17/11>>> ETT 09/17/11>>>4/2  Cultures: 4/3 uc>>Neg 4/3 bc x 2>>neg 4/3 sputum >>> neg  I dont see thisin EMR?  Antibiotics: 4/3 maxipime (asp PNA)>>  Tests / Events: No distress No fevers  Vital Signs: Temp:  [98.7 F (37.1 C)-101.1 F (38.4 C)] 98.7 F (37.1 C) (04/08 0400) Pulse Rate:  [44-77] 63  (04/08 1000) Resp:  [17-25] 22  (04/08 1100) BP: (116-179)/(56-89) 139/69 mmHg (04/08 1100) SpO2:  [89 %-98 %] 97 % (04/08 1100) Weight:  [83.5 kg (184 lb 1.4 oz)] 83.5 kg (184 lb 1.4 oz) (04/08 0500)   Intake/Output Summary (Last 24 hours) at 09/24/11 1147 Last data filed at 09/24/11 1100  Gross per 24 hour  Intake   4060 ml  Output   2905 ml  Net   1155 ml   Physical Examination: General:  Patient in no distress awake, follows commands Neuro:  awaken, right intraventricular drain noted HEENT:  No jvd  Cardiovascular:  Regular rate and rhythm without S3 normal S1-S2, frequent pac's Lungs:  CTA Abdomen:  Soft nontender bowel sounds hypoactive no masses Musculoskeletal:  Full  range of motion Skin:  Clear  Labs and Imaging:  .  Lab 09/24/11 0350 09/23/11 0355 09/22/11 0600 09/20/11 0400 09/19/11 0500  NA 134* 136 140 138 142  K 4.2 4.4 -- -- --  CL 104 105 108 106 111  CO2 22 22 23 22 22   GLUCOSE 96 104* 108* 128* 105*  BUN 20 19 15 17 18   CREATININE 0.82 0.83 0.82 0.90 0.97  CALCIUM 8.5 8.5 8.6 8.4 8.5  MG -- 1.9 1.8 1.8 --  PHOS -- 3.2 3.1 2.9 --    Lab 09/24/11 0350 09/23/11 0355 09/22/11 0600  HGB 10.4* 10.3* 11.3*  HCT 31.6* 30.5* 33.6*  WBC 11.3* 12.9* 12.6*  PLT 318 283 276    Lab 09/17/11 1315  AST --  ALT --  ALKPHOS --  BILITOT --  PROT --  ALBUMIN --  INR 0.97   Dg Chest Port 1 View  09/24/2011  *RADIOLOGY REPORT*  Clinical Data: Right lower lobe pneumonia.  PORTABLE CHEST - 1 VIEW  Comparison: 09/22/2011  Findings: Hyperinflation.  Mild cardiomegaly.  Small right pleural effusion. No pneumothorax.  No change in right lower lobe airspace disease.  IMPRESSION: No change in right lower lobe airspace disease and adjacent pleural fluid.  Favor infection.  Original Report Authenticated By: Areta Haber, M.D.    Assessment and Plan: Patient Active Hospital Problem List: Subarachnoid hemorrhage (09/17/2011)   Assessment: Subarachnoid hemorrhage grade 2 due to rupture of right posterior to indicating  aneurysm status post coiling    Plan:  Per neurosurgery, drain in place, no free water  Acute respiratory failure with hypoxia (09/17/2011)   Assessment: Respiratory failure with mild hypoxemia do to subarachnoid hemorrhage and altered consciousness and need for intubation for placement of coiling and need for ongoing sedation post coiling of aneurysm  Appears as collapse from 4/3 on ward   Plan:  Collapse more likely than effusion, see serial films 4/3 to 4/4 Add mucomysts (if available), add chest PT Will consider addition NIMV intermittent, cpap 4 hours onj 4 off x 24 hrs pcxr in am  IS Suction flutter OK May [place] chest if not open by am   cefipime for now   Posterior communicating artery aneurysm (09/17/2011) Assessment: Status post coiling per neuro radiology  Plan:  Per neuroradiology and neurosurgery  Note intraventricular drain has been placed             IVF per NS    Lavon Paganini. Titus Mould, MD, [place] Pgr: [facility]

## 2011-09-24 NOTE — Progress Notes (Signed)
Subjective: Patient sitting up in bed, resting comfortably. Denies headache, but still has significant stiffness of the neck. Eating well. Up and ambulating in the ICU. IVC continued to drain well, CSF labs look good this morning. For TCDsis more lightly blood-tinged.  Objective: Vital signs in last 24 hours: Filed Vitals:   09/24/11 0400 09/24/11 0500 09/24/11 0600 09/24/11 0700  BP: 152/65 135/61 143/63 153/68  Pulse:  73 65 65  Temp: 98.7 F (37.1 C)     TempSrc: Oral     Resp: 19 19 20 25   Height:      Weight:  83.5 kg (184 lb 1.4 oz)    SpO2: 93% 92% 93% 96%    Intake/Output from previous day: 04/07 0701 - 04/08 0700 In: 3580 [P.O.:480; I.V.:3000; IV Piggyback:100] Out: 2514 [Urine:2225; Drains:289] Intake/Output this shift:    Physical Exam:  Awake, alert, oriented. Pupils equal round and reactive to light. Extraocular movements intact. Facial movements symmetrical. Moving all 4 extremity is well. No drift of upper extremities. 2-3+ meningismus.  CBC  Basename 09/24/11 0350 09/23/11 0355  WBC 11.3* 12.9*  HGB 10.4* 10.3*  HCT 31.6* 30.5*  PLT 318 283   BMET  Basename 09/24/11 0350 09/23/11 0355  NA 134* 136  K 4.2 4.4  CL 104 105  CO2 22 22  GLUCOSE 96 104*  BUN 20 19  CREATININE 0.82 0.83  CALCIUM 8.5 8.5    Assessment/Plan: Stable neurologically, 7 days status post aneurysmal subarachnoid hemorrhage, status post partial coiling of right posterior communicating artery aneurysm. Labs look good this morning. For TCDs today. We'll check CT brain without today.   Hosie Spangle, MD 09/24/2011, 7:56 AM The area of her

## 2011-09-24 NOTE — Progress Notes (Signed)
Patient refused 0000 CPT. RT will continue to monitor.

## 2011-09-24 NOTE — Progress Notes (Signed)
ANTIBIOTIC CONSULT NOTE - FOLLOW UP  Pharmacy Consult for cefepime Indication: pneumonia  No Known Allergies  Patient Measurements: Height: 5' 10"  (177.8 cm) Weight: 184 lb 1.4 oz (83.5 kg) IBW/kg (Calculated) : 73   Vital Signs: Temp: 98.7 F (37.1 C) (04/08 0400) Temp src: Oral (04/08 0400) BP: 159/71 mmHg (04/08 0800) Pulse Rate: 75  (04/08 0800) Intake/Output from previous day: 04/07 0701 - 04/08 0700 In: 3580 [P.O.:480; I.V.:3000; IV Piggyback:100] Out: 2514 [Urine:2225; Drains:289] Intake/Output from this shift: Total I/O In: 365 [P.O.:240; I.V.:125] Out: 222 [Urine:200; Drains:22]  Labs:  [facility] 09/24/11 0350 09/23/11 0355 09/22/11 0600  WBC 11.3* 12.9* 12.6*  HGB 10.4* 10.3* 11.3*  PLT 318 283 276  [facility] -- -- --  CREATININE 0.82 0.83 0.82   Estimated Creatinine Clearance: 91.5 ml/min (by C-G formula based on Cr of 0.82).   Assessment: Patient is a 66 y.o M s/p subarachnoid hemorrhage currently on cefepime day #6 for suspected PNA.  Tmax= 101.1 on 4/7 but currently afebrile. Renal function has been stable and all cultures have been negative this far. Noted plans to change to augmentin (10 days total abx) when out of the ICU.  Plan:  1) continue current cefepime regimen 2) Will follow progress  Dareen Piano 09/24/2011,9:52 AM

## 2011-09-24 NOTE — Progress Notes (Signed)
Utilization review completed. Rozanna Boer, RN, BSN. 09/24/11

## 2011-09-24 NOTE — Progress Notes (Addendum)
TCD completed at 11:30.  See results from 09-18-11 for today's findings.

## 2011-09-24 NOTE — Progress Notes (Signed)
Patient did not want to wear cpap or have CPT performed due to a severe headache. RT told RN about patient's headache. RT will continue to monitor.

## 2011-09-25 ENCOUNTER — Inpatient Hospital Stay (HOSPITAL_COMMUNITY): Payer: Managed Care, Other (non HMO)

## 2011-09-25 LAB — CULTURE, BLOOD (ROUTINE X 2)
Culture  Setup Time: 201304032119
Culture: NO GROWTH

## 2011-09-25 NOTE — Progress Notes (Signed)
Physical Therapy Treatment Patient Details Name: Sean Pope MRN: [id number] DOB: 1946-02-26 Today's Date: 09/25/2011  PT Assessment/Plan  PT - Assessment/Plan Comments on Treatment Session: Pt admitted with a medical diagnosis of SAH treated with IVC. Pt also with medical complications of pneumonia.  He did not feel strong enough today to do 300' like he did yesterday this could be secondary to fatigue or the fact that he was not holding the IV pole for gait today and that was more energy consuming for him.  He was able to perform most of the exercises we performed in sitting yesterday in standing today, so even though he did not maintain his gait distance he was able to tolerate some more intensive standing ther ex.  O2 sats remained in the mid 90s during gait on 4 L O2 [place].   PT Plan: Discharge plan remains appropriate;Frequency remains appropriate PT Frequency: Min 4X/week Follow Up Recommendations: Home health PT;Outpatient PT Equipment Recommended: None recommended by PT PT Goals  Acute Rehab PT Goals PT Goal: Sit to Stand - Progress: Met PT Goal: Stand to Sit - Progress: Met PT Goal: Ambulate - Progress: Progressing toward goal PT Goal: Perform Home Exercise Program - Progress: Progressing toward goal  PT Treatment Precautions/Restrictions  Precautions Precautions: Other (comment);Fall (IVC drain- needs to be clamped for gait)) Required Braces or Orthoses: No Restrictions Weight Bearing Restrictions: No Mobility (including Balance) Transfers Sit to Stand: 6: Modified independent (Device/Increase time) Stand to Sit: 6: Modified independent (Device/Increase time) Ambulation/Gait Ambulation/Gait Assistance: 4: Min assist Ambulation/Gait Assistance Details (indicate cue type and reason): min assist today secondary to patient states that he feels more fatigued today than yesterday.  He also did not have the IV pole to hold onto today and it presented as more of a balance challenge  for him.   Ambulation Distance (Feet): 150 Feet Assistive device: None (did not hold IV pole today, reached for railing and furnitur) Gait Pattern: Step-through pattern    Exercise  General Exercises - Lower Extremity Long Arc Quad: AROM;Both;10 reps;Other (comment);Seated (5" holds) Hip ABduction/ADduction: AROM;Both;10 reps;Standing Hip Flexion/Marching: AROM;Standing;10 reps;Both Toe Raises: Both;10 reps;Standing;AROM Heel Raises: AROM;Both;10 reps;Standing Other Exercises Other Exercises: gentle neck stretch bil UT and chin to chest 2x 15 sec holds secondary to patient reporting neck stiffness and soreness from being immobile.   End of Session PT - End of Session Equipment Utilized During Treatment:  (4L O2 [place], IVC clamped) General Behavior During Session: [facility] for tasks performed Cognition: Impaired Cognitive Impairment: The patient could not remember who visited with him this morning when I stopped by.  After a description of the people he deduced that it was his daughter and a friend of his, but could not recall this on his own.  He also said he had not been walking in two days and we walked yesterday with his neighbor.    Barbarann Ehlers [place], [place], DPT [phone] 09/25/2011, 2:48 PM

## 2011-09-25 NOTE — Progress Notes (Signed)
Name: Sean Pope MRN: [id number] DOB: [birth year]    LOS: 8  PCCM PROGRESS NOTE  History of Present Illness: This is a 66 year old white male who presents with a right P-comm aneurysm with spontaneous rupture. The patient has a negative past history the exception of active smoking. After coming home from walking his dog this morning he developed acute onset headaches and nausea. Patient brought to the emergency room found to have a subarachnoid hemorrhage grade 2. Patient was taken to cerebral angiography and found to have a right P-comm aneurysm rupture. This was coiled by neuroradiology.  Postop the patient has been left on mechanical vent support and brought back to the intensive care. An intraventricular drain has been placed on neurosurgery. There was dilation of the fourth ventricle seen on CT scan. Critical care medicine was asked to help with support this patient.  Lines / Drains: IVC drain 09/17/11>>> ETT 09/17/11>>>4/2  Cultures: 4/3 uc>>Neg 4/3 bc x 2>>neg 4/3 sputum >>> neg  I dont see thisin EMR?  Antibiotics: 4/3 maxipime (asp PNA)>>>   Tests / Events: NIMV 4/8 CT chest -RLL collapse, small assocaited effusion  Vital Signs: Temp:  [98.2 F (36.8 C)-99.5 F (37.5 C)] 98.2 F (36.8 C) (04/08 1927) Pulse Rate:  [31-78] 52  (04/09 0800) Resp:  [14-30] 23  (04/09 0800) BP: (124-189)/(52-118) 130/57 mmHg (04/09 0800) SpO2:  [83 %-99 %] 93 % (04/09 0800) Weight:  [81 kg (178 lb 9.2 oz)] 81 kg (178 lb 9.2 oz) (04/09 0600)   Intake/Output Summary (Last 24 hours) at 09/25/11 0852 Last data filed at 09/25/11 0800  Gross per 24 hour  Intake   4540 ml  Output   2522 ml  Net   2018 ml   Physical Examination: General:  Patient in no distress awake, follows commands Neuro:  awaken, right intraventricular drain noted and raised this am  HEENT:  No jvd  Cardiovascular:  Regular rate and rhythm without S3 normal S1-S2 Lungs:  CTA, reduced rt base, egophany Abdomen:  Soft  nontender bowel sounds hypoactive no masses Musculoskeletal:  Full range of motion Skin:  Clear  Labs and Imaging:  .  Lab 09/24/11 0350 09/23/11 0355 09/22/11 0600 09/20/11 0400 09/19/11 0500  NA 134* 136 140 138 142  K 4.2 4.4 -- -- --  CL 104 105 108 106 111  CO2 22 22 23 22 22   GLUCOSE 96 104* 108* 128* 105*  BUN 20 19 15 17 18   CREATININE 0.82 0.83 0.82 0.90 0.97  CALCIUM 8.5 8.5 8.6 8.4 8.5  MG -- 1.9 1.8 1.8 --  PHOS -- 3.2 3.1 2.9 --    Lab 09/24/11 0350 09/23/11 0355 09/22/11 0600  HGB 10.4* 10.3* 11.3*  HCT 31.6* 30.5* 33.6*  WBC 11.3* 12.9* 12.6*  PLT 318 283 276   No results found for this basename: AST:5,ALT:5,ALKPHOS:5,BILITOT:5,PROT:5,ALBUMIN:5,INR:5 in the last 168 hours Ct Head Wo Contrast  09/24/2011  *RADIOLOGY REPORT*  Clinical Data: Mild headache.  CT HEAD WITHOUT CONTRAST  Technique:  Contiguous axial images were obtained from the base of the skull through the vertex without contrast.  Comparison: Most recent 09/20/2011  Findings: Improved hydrocephalus.  Right ventricular shunt catheter lies with its tip in the third ventricle, and slightly crosses the upper aspect of the basal ganglia.  Improved intraventricular hemorrhage.  Resolving right greater than left subarachnoid blood.  Asymmetric hypodensity left occipital lobe could represent ischemia or infarction from vasospasm or herniation.  Continued surveillance warranted.  Calvarium intact other  than shunt catheter. Endovascular obliteration of right Pcom aneurysm with coils noted.  IMPRESSION: Improved hydrocephalus.  Evolving hypodensity left occipital lobe could represent ischemia or infarction.  Continued surveillance warranted.  MRI could be helpful in further evaluation.  Original Report Authenticated By: Staci Righter, M.D.   Ct Chest Wo Contrast  09/25/2011  *RADIOLOGY REPORT*  Clinical Data: Evaluate pneumonia.  CT CHEST WITHOUT CONTRAST  Technique:  Multidetector CT imaging of the chest was performed  following the standard protocol without IV contrast.  Comparison: 09/24/2011 chest x-ray.  Findings: Obstruction of the right lower lobe bronchus with atelectasis and right-sided pleural effusion.  Hazy parenchymal changes posterior inferior right upper lobe.  Mediastinal shift to the right. Question mucus plugging versus bronchial obstructing lesion.  Coronary artery calcifications.  Dilated ascending thoracic aorta measuring up to 3.7 cm.  Fatty containing slightly prompt size pretracheal lymph node.  Right renal cyst.  Probable gallstone.  IMPRESSION: Obstruction of the right lower lobe bronchus with atelectasis and right-sided pleural effusion.   Question mucus plug.  Please see above.  Critical Value/emergent results were called by telephone at the time of interpretation on 09/25/2011  at 2:40 a.m.  to  [facility] patient's nurse, who verbally acknowledged these results.  Original Report Authenticated By: Doug Sou, M.D.   Dg Chest Port 1 View  09/25/2011  *RADIOLOGY REPORT*  Clinical Data: Evaluate collapse of right lower lobe  PORTABLE CHEST - 1 VIEW  Comparison: Portable chest x-ray of 09/24/2011 and CT chest of the same date  Findings: There is little change in opacity at the right lung base, with persistent collapse of the right lower lobe as noted on recent CT. Some of the opacity appears to be due to a small right pleural effusion.  The left lung is clear.  Heart size is stable.  IMPRESSION: No change in right lower lobe atelectasis and right effusion.  Original Report Authenticated By: Joretta Bachelor, M.D.   Dg Chest Port 1 View  09/24/2011  *RADIOLOGY REPORT*  Clinical Data: Right lower lobe pneumonia.  PORTABLE CHEST - 1 VIEW  Comparison: 09/22/2011  Findings: Hyperinflation.  Mild cardiomegaly.  Small right pleural effusion. No pneumothorax.  No change in right lower lobe airspace disease.  IMPRESSION: No change in right lower lobe airspace disease and adjacent pleural fluid.  Favor infection.   Original Report Authenticated By: Areta Haber, M.D.    Assessment and Plan: Patient Active Hospital Problem List: Subarachnoid hemorrhage (09/17/2011)   Assessment: Subarachnoid hemorrhage grade 2 due to rupture of right posterior to indicating aneurysm status post coiling    Plan:  Per neurosurgery, drain in place, no free water, TCd eval, nimodipine, HHH  Acute respiratory failure with hypoxia (09/17/2011)   Assessment: Respiratory failure off vent, collapse RLL, asp event?, PNA Appears as collapse  Plan: CT reviewed - collapse noted, Effusion is small, and NO reason to tap , would not help As no distress would avoid bronch if able, continue NON invasive method NIMV, cpap, mucomysts, chest pt pcxr in am  If not open within next 48 hrs, may consider bronch Ambulate Upright as able Cefepime, will establish stop date once clinical course noted to be clearer  Posterior communicating artery aneurysm (09/17/2011) Assessment: Status post coiling per neuro radiology  Plan:  Per neuroradiology and neurosurgery  Note intraventricular drain has been raised             IVF per NS   Lavon Paganini. Titus Mould, MD, Rosalita Chessman  Pgr: [facility]

## 2011-09-25 NOTE — Progress Notes (Signed)
Radiologist called with results from the chest CT patient had done on 4/8, he asked that I call the black box Dr to let them know about the results, I called and spoke with the Dr and she said she would pass along the information.

## 2011-09-25 NOTE — Progress Notes (Signed)
Placed patient on cpap. Patient is tolerating well for now. RT will continue to monitor.

## 2011-09-25 NOTE — Progress Notes (Signed)
Occupational Therapy Treatment Patient Details Name: Willi Borowiak MRN: [id number] DOB: 06-25-1945 Today's Date: 09/25/2011    OT Goals ADL Goals Pt Will Perform Grooming: with modified independence;Standing at sink;Sitting at sink ADL Goal: Grooming - Progress: Progressing toward goals Additional ADL Goal #1: Pt will be able to verbalize and demonstrate energy conservation techniques during ADLs 75% of time. ADL Goal: Additional Goal #1 - Progress: Progressing toward goals  OT Treatment Precautions/Restrictions  Precautions Precautions: Other (comment) (IVC drain- needs to be clamped for gait))   ADL ADL ADL Comments: Pt reviewed energy conservation handout with OT and established 4 techniques pt plans to use at d/c. Next OT session to complete full ADL at sink level     Veneda Melter  09/25/2011, 1:34 PM Pager: [phone]

## 2011-09-25 NOTE — Progress Notes (Signed)
Patient has refused CPT all night and now patient is asleep. CPT not done. RT will continue to monitor.

## 2011-09-25 NOTE — Progress Notes (Signed)
Subjective: Patient resting comfortably in bed. He has been up and ambulating up to 3 times per day in the ICU. IVC continued to drain well. CSF slightly more xanthochromic, and slightly less bloody. TCDs yesterday showed some increased velocities, we'll continue to monitor, and patient continues on Nimotop.  Objective: Vital signs in last 24 hours: Filed Vitals:   09/25/11 0500 09/25/11 0600 09/25/11 0700 09/25/11 0800  BP: 143/78 139/70 152/75 130/57  Pulse: 38 31  52  Temp:      TempSrc:      Resp: 30 20 17 23   Height:      Weight:  81 kg (178 lb 9.2 oz)    SpO2: 96% 97% 96% 93%    Intake/Output from previous day: 04/08 0701 - 04/09 0700 In: 8119 [P.O.:1440; I.V.:3000; IV Piggyback:100] Out: 2205 [Urine:1875; Drains:330] Intake/Output this shift: Total I/O In: 365 [P.O.:240; I.V.:125] Out: 539 [Urine:525; Drains:14]  Physical Exam:  Awake and alert, oriented. Pupils are equal round and reactive to light. Extra ocular movements intact. Facial movements symmetrical. Moving all 4 extremities well. No drift of the upper extremities.  CBC  Basename 09/24/11 0350 09/23/11 0355  WBC 11.3* 12.9*  HGB 10.4* 10.3*  HCT 31.6* 30.5*  PLT 318 283   BMET  Basename 09/24/11 0350 09/23/11 0355  NA 134* 136  K 4.2 4.4  CL 104 105  CO2 22 22  GLUCOSE 96 104*  BUN 20 19  CREATININE 0.82 0.83  CALCIUM 8.5 8.5    Studies/Results: Ct Head Wo Contrast  09/24/2011  *RADIOLOGY REPORT*  Clinical Data: Mild headache.  CT HEAD WITHOUT CONTRAST  Technique:  Contiguous axial images were obtained from the base of the skull through the vertex without contrast.  Comparison: Most recent 09/20/2011  Findings: Improved hydrocephalus.  Right ventricular shunt catheter lies with its tip in the third ventricle, and slightly crosses the upper aspect of the basal ganglia.  Improved intraventricular hemorrhage.  Resolving right greater than left subarachnoid blood.  Asymmetric hypodensity left occipital  lobe could represent ischemia or infarction from vasospasm or herniation.  Continued surveillance warranted.  Calvarium intact other than shunt catheter. Endovascular obliteration of right Pcom aneurysm with coils noted.  IMPRESSION: Improved hydrocephalus.  Evolving hypodensity left occipital lobe could represent ischemia or infarction.  Continued surveillance warranted.  MRI could be helpful in further evaluation.  Original Report Authenticated By: Staci Righter, M.D.   Ct Chest Wo Contrast  09/25/2011  *RADIOLOGY REPORT*  Clinical Data: Evaluate pneumonia.  CT CHEST WITHOUT CONTRAST  Technique:  Multidetector CT imaging of the chest was performed following the standard protocol without IV contrast.  Comparison: 09/24/2011 chest x-ray.  Findings: Obstruction of the right lower lobe bronchus with atelectasis and right-sided pleural effusion.  Hazy parenchymal changes posterior inferior right upper lobe.  Mediastinal shift to the right. Question mucus plugging versus bronchial obstructing lesion.  Coronary artery calcifications.  Dilated ascending thoracic aorta measuring up to 3.7 cm.  Fatty containing slightly prompt size pretracheal lymph node.  Right renal cyst.  Probable gallstone.  IMPRESSION: Obstruction of the right lower lobe bronchus with atelectasis and right-sided pleural effusion.   Question mucus plug.  Please see above.  Critical Value/emergent results were called by telephone at the time of interpretation on 09/25/2011  at 2:40 a.m.  to  [facility] patient's nurse, who verbally acknowledged these results.  Original Report Authenticated By: Doug Sou, M.D.   Dg Chest Port 1 View  09/24/2011  *RADIOLOGY REPORT*  Clinical Data: Right lower lobe pneumonia.  PORTABLE CHEST - 1 VIEW  Comparison: 09/22/2011  Findings: Hyperinflation.  Mild cardiomegaly.  Small right pleural effusion. No pneumothorax.  No change in right lower lobe airspace disease.  IMPRESSION: No change in right lower lobe airspace  disease and adjacent pleural fluid.  Favor infection.  Original Report Authenticated By: Areta Haber, M.D.    Assessment/Plan: CT brain yesterday showed normal ventricular size, some persistence of subarachnoid hemorrhage, but clearing of intraventricular hemorrhage. Dr. Rolla Flatten, the interpreting radiologist, raised the concern of hypodensity within the left occipital lobe, which we will need to continue to monitor with periodic CT scans. CT of chest report reviewed and describes atelectasis of right lower lobe and right chest pleural effusion. These problems are being addressed by Dr. Nadara Mode from [facility]. We'll begin to raise IVC, today we'll raise up to 10 cm of water, and monitor his neurologic function. We'll continue to monitor TCDs:  Monday, Wednesday, and Friday. We'll continue IVF.  Hosie Spangle, MD 09/25/2011, 8:32 AM

## 2011-09-26 ENCOUNTER — Inpatient Hospital Stay (HOSPITAL_COMMUNITY): Payer: Managed Care, Other (non HMO)

## 2011-09-26 DIAGNOSIS — J9819 Other pulmonary collapse: Secondary | ICD-10-CM

## 2011-09-26 HISTORY — DX: Other pulmonary collapse: J98.19

## 2011-09-26 NOTE — Progress Notes (Signed)
TCD completed at 11:45.  See 09/18/11 notes for today's results.

## 2011-09-26 NOTE — Progress Notes (Signed)
Name: Sean Pope MRN: [id number] DOB: [birth year]    LOS: 9  PCCM PROGRESS NOTE  History of Present Illness: This is a 66 year old white male who presents with a right P-comm aneurysm with spontaneous rupture. The patient has a negative past history the exception of active smoking. After coming home from walking his dog this morning he developed acute onset headaches and nausea. Patient brought to the emergency room found to have a subarachnoid hemorrhage grade 2. Patient was taken to cerebral angiography and found to have a right P-comm aneurysm rupture. This was coiled by neuroradiology.  Postop the patient has been left on mechanical vent support and brought back to the intensive care. An intraventricular drain has been placed on neurosurgery. There was dilation of the fourth ventricle seen on CT scan. Critical care medicine was asked to help with support this patient.  Lines / Drains: IVC drain 09/17/11>>> ETT 09/17/11>>>4/2  Cultures: 4/3 uc>>Neg 4/3 bc x 2>>neg 4/3 sputum >>> neg  I dont see thisin EMR?  Antibiotics: 4/3 maxipime (asp PNA)>>>stop date 11th  Tests / Events: NIMV 4/8 CT chest -RLL collapse, small assocaited effusion 4/10- no changes in pcxr, good clinical status  Vital Signs: Temp:  [98.7 F (37.1 C)-100 F (37.8 C)] 98.8 F (37.1 C) (04/10 0700) Pulse Rate:  [30-74] 67  (04/10 0900) Resp:  [15-22] 18  (04/10 0900) BP: (105-161)/(53-120) 139/61 mmHg (04/10 0900) SpO2:  [90 %-100 %] 90 % (04/10 0900)   Intake/Output Summary (Last 24 hours) at 09/26/11 1005 Last data filed at 09/26/11 0900  Gross per 24 hour  Intake   4235 ml  Output   1770 ml  Net   2465 ml   Physical Examination: General:  Patient in no distress awake, follows commands Neuro:  awaken, right intraventricular drain HEENT:  No jvd  Cardiovascular:  Regular rate and rhythm without S3 normal S1-S2 Lungs:  CTA, reduced rt base no changes Abdomen:  Soft nontender bowel sounds hypoactive no  masses   Labs and Imaging:  .  Lab 09/24/11 0350 09/23/11 0355 09/22/11 0600 09/20/11 0400  NA 134* 136 140 138  K 4.2 4.4 -- --  CL 104 105 108 106  CO2 22 22 23 22   GLUCOSE 96 104* 108* 128*  BUN 20 19 15 17   CREATININE 0.82 0.83 0.82 0.90  CALCIUM 8.5 8.5 8.6 8.4  MG -- 1.9 1.8 1.8  PHOS -- 3.2 3.1 2.9    Lab 09/24/11 0350 09/23/11 0355 09/22/11 0600  HGB 10.4* 10.3* 11.3*  HCT 31.6* 30.5* 33.6*  WBC 11.3* 12.9* 12.6*  PLT 318 283 276   No results found for this basename: AST:5,ALT:5,ALKPHOS:5,BILITOT:5,PROT:5,ALBUMIN:5,INR:5 in the last 168 hours Ct Head Wo Contrast  09/24/2011  *RADIOLOGY REPORT*  Clinical Data: Mild headache.  CT HEAD WITHOUT CONTRAST  Technique:  Contiguous axial images were obtained from the base of the skull through the vertex without contrast.  Comparison: Most recent 09/20/2011  Findings: Improved hydrocephalus.  Right ventricular shunt catheter lies with its tip in the third ventricle, and slightly crosses the upper aspect of the basal ganglia.  Improved intraventricular hemorrhage.  Resolving right greater than left subarachnoid blood.  Asymmetric hypodensity left occipital lobe could represent ischemia or infarction from vasospasm or herniation.  Continued surveillance warranted.  Calvarium intact other than shunt catheter. Endovascular obliteration of right Pcom aneurysm with coils noted.  IMPRESSION: Improved hydrocephalus.  Evolving hypodensity left occipital lobe could represent ischemia or infarction.  Continued surveillance warranted.  MRI could be helpful in further evaluation.  Original Report Authenticated By: Staci Righter, M.D.   Ct Chest Wo Contrast  09/25/2011  *RADIOLOGY REPORT*  Clinical Data: Evaluate pneumonia.  CT CHEST WITHOUT CONTRAST  Technique:  Multidetector CT imaging of the chest was performed following the standard protocol without IV contrast.  Comparison: 09/24/2011 chest x-ray.  Findings: Obstruction of the right lower lobe  bronchus with atelectasis and right-sided pleural effusion.  Hazy parenchymal changes posterior inferior right upper lobe.  Mediastinal shift to the right. Question mucus plugging versus bronchial obstructing lesion.  Coronary artery calcifications.  Dilated ascending thoracic aorta measuring up to 3.7 cm.  Fatty containing slightly prompt size pretracheal lymph node.  Right renal cyst.  Probable gallstone.  IMPRESSION: Obstruction of the right lower lobe bronchus with atelectasis and right-sided pleural effusion.   Question mucus plug.  Please see above.  Critical Value/emergent results were called by telephone at the time of interpretation on 09/25/2011  at 2:40 a.m.  to  [facility] patient's nurse, who verbally acknowledged these results.  Original Report Authenticated By: Doug Sou, M.D.   Dg Chest Port 1 View  09/26/2011  *RADIOLOGY REPORT*  Clinical Data: Assess right lower lobe collapse.  PORTABLE CHEST - 1 VIEW  Comparison: 09/25/2011  Findings: Right lower lobe atelectasis and probable right effusion again noted, not significantly changed.  Left lung is clear.  Heart is normal size.  IMPRESSION: No significant change right lower lobe atelectasis and effusion.  Original Report Authenticated By: Raelyn Number, M.D.   Dg Chest Port 1 View  09/25/2011  *RADIOLOGY REPORT*  Clinical Data: Evaluate collapse of right lower lobe  PORTABLE CHEST - 1 VIEW  Comparison: Portable chest x-ray of 09/24/2011 and CT chest of the same date  Findings: There is little change in opacity at the right lung base, with persistent collapse of the right lower lobe as noted on recent CT. Some of the opacity appears to be due to a small right pleural effusion.  The left lung is clear.  Heart size is stable.  IMPRESSION: No change in right lower lobe atelectasis and right effusion.  Original Report Authenticated By: Joretta Bachelor, M.D.    Assessment and Plan: Patient Active Hospital Problem List: Subarachnoid hemorrhage  (09/17/2011)   Assessment: Subarachnoid hemorrhage grade 2 due to rupture of right posterior to indicating aneurysm status post coiling    Plan:  Per neurosurgery, drain in place, no free water, TCd eval, nimodipine, HHH Consider slight reduction in rate fluids to 75, was 2.1 lit pos   Acute respiratory failure with hypoxia (09/17/2011)   Assessment: Respiratory failure off vent, collapse RLL, asp event likely , PNA collapse  Plan: Continued to appear well clinically pcxr unchanged however Continue chest pt, will increase pressures CPAP slight if tolerated vs changing to BIPAP x 24 hrs mucomysts to dc in 24 hrs  May need bronch in 24 hrous, would prefer drain to be in with coughing if we perform this ambulate Flutter maxed  Lavon Paganini. Titus Mould, MD, [place] Pgr: [place]

## 2011-09-26 NOTE — Progress Notes (Signed)
09/26/11- IPPB tx performed with pt for am tx this morning.  Pt with a good productive cough with tx consisting of ventolin and mucomyst.  Pt encouraged to cough and deep breath.  This pm at 1700 a saline tx with IPPB given.  Again pt had a good strong productive cough.  Instructed pt on the importance of use of both the flutter and IS.  Will cont to follow progress.

## 2011-09-26 NOTE — Progress Notes (Signed)
RT gave patient treatment with IPPB, CPT, and placed on bipap 10/5. Patient tolerated procedures well. Patient coughed up moderate Clear/white frothy secretions.

## 2011-09-26 NOTE — Progress Notes (Signed)
RT performed CPT via bed, and flutter. Patient coughed up small amount of clear, white secretions. Patient cough effort was poor. Patient complained coughing hurt his back. RT will continue to monitor.

## 2011-09-26 NOTE — Progress Notes (Signed)
Subjective: Patient resting comfortably in bed, IVC draining well, CSF now sent to chromic, no longer bloody. TCDs not yet done today. Continues on Nimotop. Continues to ambulate in ICU.  Objective: Vital signs in last 24 hours: Filed Vitals:   09/26/11 0700 09/26/11 0800 09/26/11 0900 09/26/11 1000  BP: 139/56 145/65 139/61 144/67  Pulse:   67   Temp: 98.8 F (37.1 C)     TempSrc: Oral     Resp: 19 15 18 22   Height:      Weight:      SpO2: 97% 97% 90% 96%    Intake/Output from previous day: 04/09 0701 - 04/10 0700 In: 4240 [P.O.:1140; I.V.:3000; IV Piggyback:100] Out: 2126 [Urine:1900; Drains:226] Intake/Output this shift: Total I/O In: 735 [P.O.:360; I.V.:375] Out: 453 [Urine:425; Drains:28]  Physical Exam:  Patient is awake and alert, oriented. Following commands with all 4 extremities. Speech is fluent. Pupils equal round and reactive to light and approximately 3 mm bilaterally. Extraocular movements intact. Facial movement symmetrical. Tongue midline. Moving all 4 extremities well. No drift of upper extremities.  CBC  Basename 09/24/11 0350  WBC 11.3*  HGB 10.4*  HCT 31.6*  PLT 318   BMET  Basename 09/24/11 0350  NA 134*  K 4.2  CL 104  CO2 22  GLUCOSE 96  BUN 20  CREATININE 0.82  CALCIUM 8.5    Studies/Results: Ct Head Wo Contrast  09/24/2011  *RADIOLOGY REPORT*  Clinical Data: Mild headache.  CT HEAD WITHOUT CONTRAST  Technique:  Contiguous axial images were obtained from the base of the skull through the vertex without contrast.  Comparison: Most recent 09/20/2011  Findings: Improved hydrocephalus.  Right ventricular shunt catheter lies with its tip in the third ventricle, and slightly crosses the upper aspect of the basal ganglia.  Improved intraventricular hemorrhage.  Resolving right greater than left subarachnoid blood.  Asymmetric hypodensity left occipital lobe could represent ischemia or infarction from vasospasm or herniation.  Continued surveillance  warranted.  Calvarium intact other than shunt catheter. Endovascular obliteration of right Pcom aneurysm with coils noted.  IMPRESSION: Improved hydrocephalus.  Evolving hypodensity left occipital lobe could represent ischemia or infarction.  Continued surveillance warranted.  MRI could be helpful in further evaluation.  Original Report Authenticated By: Staci Righter, M.D.   Ct Chest Wo Contrast  09/25/2011  *RADIOLOGY REPORT*  Clinical Data: Evaluate pneumonia.  CT CHEST WITHOUT CONTRAST  Technique:  Multidetector CT imaging of the chest was performed following the standard protocol without IV contrast.  Comparison: 09/24/2011 chest x-ray.  Findings: Obstruction of the right lower lobe bronchus with atelectasis and right-sided pleural effusion.  Hazy parenchymal changes posterior inferior right upper lobe.  Mediastinal shift to the right. Question mucus plugging versus bronchial obstructing lesion.  Coronary artery calcifications.  Dilated ascending thoracic aorta measuring up to 3.7 cm.  Fatty containing slightly prompt size pretracheal lymph node.  Right renal cyst.  Probable gallstone.  IMPRESSION: Obstruction of the right lower lobe bronchus with atelectasis and right-sided pleural effusion.   Question mucus plug.  Please see above.  Critical Value/emergent results were called by telephone at the time of interpretation on 09/25/2011  at 2:40 a.m.  to  [facility] patient's nurse, who verbally acknowledged these results.  Original Report Authenticated By: Doug Sou, M.D.   Dg Chest Port 1 View  09/26/2011  *RADIOLOGY REPORT*  Clinical Data: Assess right lower lobe collapse.  PORTABLE CHEST - 1 VIEW  Comparison: 09/25/2011  Findings: Right lower lobe  atelectasis and probable right effusion again noted, not significantly changed.  Left lung is clear.  Heart is normal size.  IMPRESSION: No significant change right lower lobe atelectasis and effusion.  Original Report Authenticated By: Raelyn Number, M.D.     Dg Chest Port 1 View  09/25/2011  *RADIOLOGY REPORT*  Clinical Data: Evaluate collapse of right lower lobe  PORTABLE CHEST - 1 VIEW  Comparison: Portable chest x-ray of 09/24/2011 and CT chest of the same date  Findings: There is little change in opacity at the right lung base, with persistent collapse of the right lower lobe as noted on recent CT. Some of the opacity appears to be due to a small right pleural effusion.  The left lung is clear.  Heart size is stable.  IMPRESSION: No change in right lower lobe atelectasis and right effusion.  Original Report Authenticated By: Joretta Bachelor, M.D.    Assessment/Plan: Patient continued to be stable neurosurgically, continuing pulmonary care under the direction of Dr. Nadara Mode from [facility], may need bronchoscopy tomorrow if right lower lobe does not reexpand. TCDs pending from today.  Patient stable neurosurgically with IVC having been raised from 5 to 10 cm of water yesterday, we'll raise further to 15 cm of water today, and continue to monitor. Continues on IVF.   Hosie Spangle, MD 09/26/2011, 10:20 AM

## 2011-09-27 ENCOUNTER — Inpatient Hospital Stay (HOSPITAL_COMMUNITY): Payer: Managed Care, Other (non HMO)

## 2011-09-27 MED ORDER — MIDAZOLAM HCL 2 MG/2ML IJ SOLN
INTRAMUSCULAR | Status: AC
  Administered 2011-09-27: 1 mg
  Filled 2011-09-27: qty 2

## 2011-09-27 MED ORDER — FENTANYL CITRATE 0.05 MG/ML IJ SOLN
INTRAMUSCULAR | Status: AC
  Administered 2011-09-27: 50 ug
  Filled 2011-09-27: qty 2

## 2011-09-27 MED ORDER — ACETYLCYSTEINE 20 % IN SOLN
4.0000 mL | RESPIRATORY_TRACT | Status: AC
  Administered 2011-09-27: 4 mL via RESPIRATORY_TRACT
  Filled 2011-09-27: qty 4

## 2011-09-27 NOTE — Progress Notes (Signed)
ANTIBIOTIC CONSULT NOTE - FOLLOW UP  Pharmacy Consult for cefepime Indication: pneumonia  No Known Allergies  Patient Measurements: Height: 5' 10"  (177.8 cm) Weight: 178 lb 9.2 oz (81 kg) IBW/kg (Calculated) : 73   Vital Signs: Temp: 98.6 F (37 C) (04/11 0700) Temp src: Oral (04/11 0700) BP: 159/81 mmHg (04/11 1105) Pulse Rate: 61  (04/11 1105) Intake/Output from previous day: 04/10 0701 - 04/11 0700 In: 3820 [P.O.:720; I.V.:3000; IV Piggyback:100] Out: 2170 [Urine:2025; Drains:145] Intake/Output from this shift: Total I/O In: 500 [I.V.:500] Out: 539 [Urine:475; Drains:64]  Labs: No results found for this basename: WBC:3,HGB:3,PLT:3,LABCREA:3,CREATININE:3, in the last 72 hours Estimated Creatinine Clearance: 91.5 ml/min (by C-G formula based on Cr of 0.82).   Assessment: Patient is a 66 y.o M s/p subarachnoid hemorrhage currently on cefepime day #9 for suspected PNA.  Remains afebrile. Renal function has been stable and all cultures have been negative this far. Noted plans to change to augmentin (10 days total abx) when out of the ICU.  Cefepime is scheduled to end today, but may be extended based on bronchoscopy findings.  Plan:  1) continue cefepime 2 gm IV q 12hr 2) F/up bronchoscopy  Edwyna Shell, Pharm.D., BCPS Clinical Pharmacist Pager: [phone]

## 2011-09-27 NOTE — Progress Notes (Signed)
Subjective: Patient resting comfortably in ICU, IVC continued to drain well, CSF slightly more pink tinged xanthochromic. TCDs  yesterday again showed mild increased velocities. Patient continues on Nimotop.   Objective: Vital signs in last 24 hours: Filed Vitals:   09/27/11 0417 09/27/11 0500 09/27/11 0600 09/27/11 0700  BP: 148/64 134/91 124/111 140/64  Pulse: 32 60 33 32  Temp:      TempSrc:      Resp: 18 19 17 16   Height:      Weight:      SpO2: 96% 91% 97% 99%    Intake/Output from previous day: 04/10 0701 - 04/11 0700 In: 3820 [P.O.:720; I.V.:3000; IV Piggyback:100] Out: 2170 [Urine:2025; Drains:145] Intake/Output this shift:    Physical Exam:  Patient is awake and alert, oriented fully. Pupils are equal round and reactive to light and about 4 mm bilaterally. Extraocular movements are intact. Facial movement is symmetrical. Fire 5 strength in extremities. No drift of upper extremity. 2-3+ meningismus persists.   Studies/Results: Dg Chest Port 1 View  09/26/2011  *RADIOLOGY REPORT*  Clinical Data: Assess right lower lobe collapse.  PORTABLE CHEST - 1 VIEW  Comparison: 09/25/2011  Findings: Right lower lobe atelectasis and probable right effusion again noted, not significantly changed.  Left lung is clear.  Heart is normal size.  IMPRESSION: No significant change right lower lobe atelectasis and effusion.  Original Report Authenticated By: Raelyn Number, M.D.    Assessment/Plan: Radiologist reading of this morning's chest x-ray pending, but my review shows continued collapse of right lower lobe as well as significant right pleural effusion. To be reviewed by Dr. Titus Mould, but based on his previous discussion with me, I believe we'll proceed with bronchoscopy today. We'll leave IVC open per Dr. Janit Pagan request until after bronchoscopy, and then we'll plan on having nursing staff clamp the IVC. Neurologically he continues to be stable.    Hosie Spangle,  MD 09/27/2011, 7:38 AM

## 2011-09-27 NOTE — Progress Notes (Signed)
Subjective:  Called by nursing staff this evening while in the OR, because patient had been complaining of headache, and Dr. Christella Noa, while making rounds ordered a CT of the head, and the report was called back of increased ventricular size suggestive of early hydrocephalus. The patient's IVC which had been clamped since about mid day, was reopened at 12 cm of water. We'll plan on checking a CT in the morning to recheck his ventricular size. Patient continued to complain of headache despite IVC being reopened and CSF draining.  Objective: Vital signs in last 24 hours: Filed Vitals:   09/27/11 1900 09/27/11 1920 09/27/11 2000 09/27/11 2002  BP: 192/72  171/69   Pulse: 53  54   Temp:  98.6 F (37 C)    TempSrc:  Oral    Resp: 18  13   Height:      Weight:      SpO2: 96%  98% 97%    Intake/Output from previous day: 04/10 0701 - 04/11 0700 In: 3820 [P.O.:720; I.V.:3000; IV Piggyback:100] Out: 2170 [Urine:2025; Drains:145] Intake/Output this shift: Total I/O In: 155 [P.O.:30; I.V.:125] Out: 20 [Drains:20]  Physical Exam:  Awake and alert, oriented. Following commands. Moving all extremities.  Studies/Results: Ct Head Wo Contrast  09/27/2011  *RADIOLOGY REPORT*  Clinical Data: Evaluate for hydrocephalus.  Headache.  Drain closure today.  CT HEAD WITHOUT CONTRAST  Technique:  Contiguous axial images were obtained from the base of the skull through the vertex without contrast.  Comparison: 09/24/2011.  Findings: Right frontal ventriculostomy is present, unchanged in position and terminating with the tip near the foramen of Monro on the right.  The ventricles appear larger than on the prior exam of 09/24/2011 compatible with developing hydrocephalus.  There is rounding of the temporal horns of the lateral ventricles.  The left temporal horn appears more prominent than on the prior CT of 09/24/2011.  Residual subarachnoid hemorrhage is present on the right.  Minimal hypodensity in the left  occipital lobe appears unchanged. Metallic density in the right MCA distribution is compatible with coiling.  IMPRESSION: 1.  Mild enlargement of the lateral ventricles with rounding of the temporal horns is consistent with developing hydrocephalus. 2.  Residual subarachnoid hemorrhage over the posterior right frontal and parietal lobes. 3.  Postoperative changes of right MCA coiling.  Original Report Authenticated By: Dereck Ligas, M.D.   Dg Chest Port 1 View  09/27/2011  *RADIOLOGY REPORT*  Clinical Data: Bronchoscopy  PORTABLE CHEST - 1 VIEW  Comparison: 09/27/2011  Findings: Slight decrease in the right effusion.  Minimal improvement in right lower lobe collapse / consolidation.  No pneumothorax.  Stable left lung aeration.  No edema.  IMPRESSION: No pneumothorax following bronchoscopy.  Original Report Authenticated By: Jerilynn Mages. Daryll Brod, M.D.   Dg Chest Port 1 View  09/27/2011  *RADIOLOGY REPORT*  Clinical Data: Evaluate collapse  PORTABLE CHEST - 1 VIEW  Comparison: 09/26/2011; 09/25/2011; 09/24/2011; 09/19/2011; chest CT - 09/24/2011  Findings: Grossly unchanged cardiac silhouette and mediastinal contours.  Grossly unchanged right lower lung consolidative opacities and volume loss.  Unchanged small right-sided pleural effusion.  The left hemithorax is unchanged.  No pneumothorax. Unchanged bones.  IMPRESSION: Unchanged right lower lung consolidative opacities and volume loss. Given the stability of this finding, further evaluation with bronchoscopy (to evaluate for obstructing mass) may be performed as clinically indicated.  Original Report Authenticated By: Rachel Moulds, M.D.   Dg Chest Port 1 View  09/26/2011  *RADIOLOGY REPORT*  Clinical  Data: Assess right lower lobe collapse.  PORTABLE CHEST - 1 VIEW  Comparison: 09/25/2011  Findings: Right lower lobe atelectasis and probable right effusion again noted, not significantly changed.  Left lung is clear.  Heart is normal size.  IMPRESSION: No  significant change right lower lobe atelectasis and effusion.  Original Report Authenticated By: Raelyn Number, M.D.    Assessment/Plan: Stable neurologically, but complaining of headache. For CT brain without and a.m., and TCDs tomorrow.   Hosie Spangle, MD 09/27/2011, 9:16 PM

## 2011-09-27 NOTE — Progress Notes (Signed)
Bronchoscopy Procedure Note Sean Pope [id number] August 28, 1945  Procedure: Bronchoscopy Indications: Obtain specimens for culture and/or other diagnostic studies and Remove secretions  Procedure Details Consent: Risks of procedure as well as the alternatives and risks of each were explained to the (patient/caregiver).  Consent for procedure obtained. Time Out: Verified patient identification, verified procedure, site/side was marked, verified correct patient position, special equipment/implants available, medications/allergies/relevent history reviewed, required imaging and test results available. Timeout:   Performed  In preparation for procedure, bronchoscope lubricated and inhaled beta agonist administered. Sedation: Benzodiazepines     Procedures performed: BAL Bronchoscope removed.  , Patient placed back on 100% FiO2 at conclusion of procedure.    Evaluation Hemodynamic Status: BP stable throughout; O2 sats: transiently fell during during procedure and currently acceptable Patient's Current Condition: stable Specimens:  Sent serosanguinous fluid Complications: No apparent complications Patient did tolerate procedure well.   Sean Pope Royal 09/27/2011

## 2011-09-27 NOTE — Procedures (Signed)
Bronchoscopy Procedure Note Sean Pope [id number] 04/18/1946  Procedure: Bronchoscopy Indications: Remove secretions, collapse continued  Procedure Details Consent: Risks of procedure as well as the alternatives and risks of each were explained to the (patient/caregiver).  Consent for procedure obtained. Time Out: Verified patient identification, verified procedure, site/side was marked, verified correct patient position, special equipment/implants available, medications/allergies/relevent history reviewed, required imaging and test results available.  Performed  In preparation for procedure,lido 4 ml bronch 2 cc on 2% on cords Sedation: Benzodiazepines  Airway entered and the following bronchi were examined: RUL, RML, RLL and Bronchi.   Procedures performed: Brushings performed Bronchoscope removed.  , Patient placed back on 100% FiO2 at conclusion of procedure.    Evaluation Hemodynamic Status: BP stable throughout; O2 sats: stable throughout Patient's Current Condition: stable Specimens:  Sent serosanguinous fluid Complications: No apparent complications Patient did tolerate procedure well.    1. Mucous plug thick wihte, clear RLL orifice, removed 2. Lavaged all obese except sup seg RLL saline total 60 cc 3. BAL RLL, back 25% white thick 3. No sig bronch trauma Updated pt , family  Tolerated well   Sean Pope J. 09/27/2011

## 2011-09-27 NOTE — Progress Notes (Signed)
PT Cancellation Note  Treatment cancelled today due to patient receiving procedure or test. Pt going for broncoscopy. Will reattempt to see patient when appropriate.   Thanks 09/27/2011 Jacqualyn Posey PTA [phone] pager [phone] office    Jacqualyn Posey 09/27/2011, 10:26 AM

## 2011-09-27 NOTE — Progress Notes (Signed)
Video Bronchoscopy performed with intervention BAL.

## 2011-09-27 NOTE — Progress Notes (Signed)
Utilization review completed. Rozanna Boer, RN, BSN. 09/27/11

## 2011-09-27 NOTE — Progress Notes (Signed)
Name: Sean Pope MRN: [id number] DOB: 1946-01-24    LOS: 9  PCCM PROGRESS NOTE  History of Present Illness: This is a 66 year old white male who presents with a right P-comm aneurysm with spontaneous rupture. The patient has a negative past history the exception of active smoking. After coming home from walking his dog this morning he developed acute onset headaches and nausea. Patient brought to the emergency room found to have a subarachnoid hemorrhage grade 2. Patient was taken to cerebral angiography and found to have a right P-comm aneurysm rupture. This was coiled by neuroradiology.  Postop the patient has been left on mechanical vent support and brought back to the intensive care. An intraventricular drain has been placed on neurosurgery. There was dilation of the fourth ventricle seen on CT scan. Critical care medicine was asked to help with support this patient.  Lines / Drains: IVC drain 09/17/11>>> ETT 09/17/11>>>4/2  Cultures: 4/3 uc>>Neg 4/3 bc x 2>>neg 4/3 sputum >>> neg  I dont see thisin EMR?  Antibiotics: 4/3 maxipime (asp PNA)>>>stop date 11th  Tests / Events: NIMV 4/8 CT chest -RLL collapse, small assocaited effusion 4/10- no changes in pcxr, good clinical status 4/11- remains with collapse  Vital Signs: Temp:  [98.7 F (37.1 C)-100 F (37.8 C)] 98.8 F (37.1 C) (04/10 0700) Pulse Rate:  [30-74] 67  (04/10 0900) Resp:  [15-22] 18  (04/10 0900) BP: (105-161)/(53-120) 139/61 mmHg (04/10 0900) SpO2:  [90 %-100 %] 90 % (04/10 0900)   Intake/Output Summary (Last 24 hours) at 09/26/11 1005 Last data filed at 09/26/11 0900  Gross per 24 hour  Intake   4235 ml  Output   1770 ml  Net   2465 ml   Physical Examination: General:  Patient in no distress awake, follows commands Neuro:  awaken, right intraventricular drain HEENT:  INcreasing jvd  Cardiovascular:  Regular rate and rhythm without S3 normal S1-S2 Lungs:  \reduced rt base  Abdomen:  Soft nontender  bowel sounds wnl   Labs and Imaging:  .  Lab 09/24/11 0350 09/23/11 0355 09/22/11 0600 09/20/11 0400  NA 134* 136 140 138  K 4.2 4.4 -- --  CL 104 105 108 106  CO2 22 22 23 22   GLUCOSE 96 104* 108* 128*  BUN 20 19 15 17   CREATININE 0.82 0.83 0.82 0.90  CALCIUM 8.5 8.5 8.6 8.4  MG -- 1.9 1.8 1.8  PHOS -- 3.2 3.1 2.9    Lab 09/24/11 0350 09/23/11 0355 09/22/11 0600  HGB 10.4* 10.3* 11.3*  HCT 31.6* 30.5* 33.6*  WBC 11.3* 12.9* 12.6*  PLT 318 283 276   No results found for this basename: AST:5,ALT:5,ALKPHOS:5,BILITOT:5,PROT:5,ALBUMIN:5,INR:5 in the last 168 hours Ct Head Wo Contrast  09/24/2011  *RADIOLOGY REPORT*  Clinical Data: Mild headache.  CT HEAD WITHOUT CONTRAST  Technique:  Contiguous axial images were obtained from the base of the skull through the vertex without contrast.  Comparison: Most recent 09/20/2011  Findings: Improved hydrocephalus.  Right ventricular shunt catheter lies with its tip in the third ventricle, and slightly crosses the upper aspect of the basal ganglia.  Improved intraventricular hemorrhage.  Resolving right greater than left subarachnoid blood.  Asymmetric hypodensity left occipital lobe could represent ischemia or infarction from vasospasm or herniation.  Continued surveillance warranted.  Calvarium intact other than shunt catheter. Endovascular obliteration of right Pcom aneurysm with coils noted.  IMPRESSION: Improved hydrocephalus.  Evolving hypodensity left occipital lobe could represent ischemia or infarction.  Continued surveillance warranted.  MRI could be helpful in further evaluation.  Original Report Authenticated By: Staci Righter, M.D.   Ct Chest Wo Contrast  09/25/2011  *RADIOLOGY REPORT*  Clinical Data: Evaluate pneumonia.  CT CHEST WITHOUT CONTRAST  Technique:  Multidetector CT imaging of the chest was performed following the standard protocol without IV contrast.  Comparison: 09/24/2011 chest x-ray.  Findings: Obstruction of the right lower  lobe bronchus with atelectasis and right-sided pleural effusion.  Hazy parenchymal changes posterior inferior right upper lobe.  Mediastinal shift to the right. Question mucus plugging versus bronchial obstructing lesion.  Coronary artery calcifications.  Dilated ascending thoracic aorta measuring up to 3.7 cm.  Fatty containing slightly prompt size pretracheal lymph node.  Right renal cyst.  Probable gallstone.  IMPRESSION: Obstruction of the right lower lobe bronchus with atelectasis and right-sided pleural effusion.   Question mucus plug.  Please see above.  Critical Value/emergent results were called by telephone at the time of interpretation on 09/25/2011  at 2:40 a.m.  to  [facility] patient's nurse, who verbally acknowledged these results.  Original Report Authenticated By: Doug Sou, M.D.   Dg Chest Port 1 View  09/26/2011  *RADIOLOGY REPORT*  Clinical Data: Assess right lower lobe collapse.  PORTABLE CHEST - 1 VIEW  Comparison: 09/25/2011  Findings: Right lower lobe atelectasis and probable right effusion again noted, not significantly changed.  Left lung is clear.  Heart is normal size.  IMPRESSION: No significant change right lower lobe atelectasis and effusion.  Original Report Authenticated By: Raelyn Number, M.D.   Dg Chest Port 1 View  09/25/2011  *RADIOLOGY REPORT*  Clinical Data: Evaluate collapse of right lower lobe  PORTABLE CHEST - 1 VIEW  Comparison: Portable chest x-ray of 09/24/2011 and CT chest of the same date  Findings: There is little change in opacity at the right lung base, with persistent collapse of the right lower lobe as noted on recent CT. Some of the opacity appears to be due to a small right pleural effusion.  The left lung is clear.  Heart size is stable.  IMPRESSION: No change in right lower lobe atelectasis and right effusion.  Original Report Authenticated By: Joretta Bachelor, M.D.    Assessment and Plan: Patient Active Hospital Problem List: Subarachnoid hemorrhage  (09/17/2011)   Assessment: Subarachnoid hemorrhage grade 2 due to rupture of right posterior to indicating aneurysm status post coiling    Plan: tcd mild vaso M, HHH, nimodipine planned 21 days  Acute respiratory failure with hypoxia (09/17/2011)   Assessment: Respiratory failure off vent, collapse RLL, asp event likely , PNA, small effusion collapse  Plan: We have had minimal improvement all week long with a multitude of therapy pcxr with unchanged status, maybe slight improved aeration at best Dc mucomyst Dc nimv Bronch at 1030  Am planned, I have consented the pt Appreciate Dr Sherwood Gambler drain management in case of sig coughing Npo abx stop date in place today, however, pending findings on bronch may extend Pending finding  Bronch may [place] chest and re assess effusion as continued needed [place] pos balance may have increased effusion Will have aggressive topical for bronch to prevent ICP elevation / headache pcxr post bronch planned  Lavon Paganini. Titus Mould, MD, [place] Pgr: [facility]

## 2011-09-28 ENCOUNTER — Inpatient Hospital Stay (HOSPITAL_COMMUNITY): Payer: Managed Care, Other (non HMO)

## 2011-09-28 ENCOUNTER — Encounter (HOSPITAL_COMMUNITY): Payer: Self-pay | Admitting: Radiology

## 2011-09-28 NOTE — Progress Notes (Signed)
TCD completed.  See notes from 09-18-11 for today's results.

## 2011-09-28 NOTE — Progress Notes (Signed)
Name: Sean Pope MRN: [id number] DOB: 1946/04/29    LOS: 9  PCCM PROGRESS NOTE  History of Present Illness: This is a 66 year old white male who presents with a right P-comm aneurysm with spontaneous rupture. The patient has a negative past history the exception of active smoking. After coming home from walking his dog this morning he developed acute onset headaches and nausea. Patient brought to the emergency room found to have a subarachnoid hemorrhage grade 2. Patient was taken to cerebral angiography and found to have a right P-comm aneurysm rupture. This was coiled by neuroradiology.  Postop the patient has been left on mechanical vent support and brought back to the intensive care. An intraventricular drain has been placed on neurosurgery. There was dilation of the fourth ventricle seen on CT scan. Critical care medicine was asked to help with support this patient.  Lines / Drains: IVC drain 09/17/11>>> ETT 09/17/11>>>4/2  Cultures: 4/3 uc>>Neg 4/3 bc x 2>>neg 4/3 sputum >>> neg  I dont see thisin EMR?  Antibiotics: 4/3 maxipime (asp PNA)>>>stop date 11th  Tests / Events: NIMV 4/8 CT chest -RLL collapse, small assocaited effusion 4/10- no changes in pcxr, good clinical status 4/11- remains with collapse 4/11 Bronch- RLL mucous , lavaged 4/11 ct head- Mild enlargement of the lateral ventricles with rounding of the  temporal horns is consistent with developing hydrocephalus.  2. Residual subarachnoid hemorrhage over the posterior right  frontal and parietal lobes.  3. Postoperative changes of right MCA coiling 4/12- CT head- no change after clamping tube  Vital Signs: Temp:  [98.7 F (37.1 C)-100 F (37.8 C)] 98.8 F (37.1 C) (04/10 0700) Pulse Rate:  [30-74] 67  (04/10 0900) Resp:  [15-22] 18  (04/10 0900) BP: (105-161)/(53-120) 139/61 mmHg (04/10 0900) SpO2:  [90 %-100 %] 90 % (04/10 0900)   Intake/Output Summary (Last 24 hours) at 09/26/11 1005 Last data filed at  09/26/11 0900  Gross per 24 hour  Intake   4235 ml  Output   1770 ml  Net   2465 ml   Physical Examination: General:  Patient in no distress awake, follows commands, walking the ICU Neuro:  awaken, right intraventricular drain HEENT: jvd wnl Cardiovascular:  Regular rate and rhythm without S3 normal S1-S2 Lungs: Improved but still reduced rt Abdomen:  Soft nontender bowel sounds wnl   Labs and Imaging:  .  Lab 09/24/11 0350 09/23/11 0355 09/22/11 0600 09/20/11 0400  NA 134* 136 140 138  K 4.2 4.4 -- --  CL 104 105 108 106  CO2 22 22 23 22   GLUCOSE 96 104* 108* 128*  BUN 20 19 15 17   CREATININE 0.82 0.83 0.82 0.90  CALCIUM 8.5 8.5 8.6 8.4  MG -- 1.9 1.8 1.8  PHOS -- 3.2 3.1 2.9    Lab 09/24/11 0350 09/23/11 0355 09/22/11 0600  HGB 10.4* 10.3* 11.3*  HCT 31.6* 30.5* 33.6*  WBC 11.3* 12.9* 12.6*  PLT 318 283 276   No results found for this basename: AST:5,ALT:5,ALKPHOS:5,BILITOT:5,PROT:5,ALBUMIN:5,INR:5 in the last 168 hours Ct Head Wo Contrast  09/24/2011  *RADIOLOGY REPORT*  Clinical Data: Mild headache.  CT HEAD WITHOUT CONTRAST  Technique:  Contiguous axial images were obtained from the base of the skull through the vertex without contrast.  Comparison: Most recent 09/20/2011  Findings: Improved hydrocephalus.  Right ventricular shunt catheter lies with its tip in the third ventricle, and slightly crosses the upper aspect of the basal ganglia.  Improved intraventricular hemorrhage.  Resolving right greater than  left subarachnoid blood.  Asymmetric hypodensity left occipital lobe could represent ischemia or infarction from vasospasm or herniation.  Continued surveillance warranted.  Calvarium intact other than shunt catheter. Endovascular obliteration of right Pcom aneurysm with coils noted.  IMPRESSION: Improved hydrocephalus.  Evolving hypodensity left occipital lobe could represent ischemia or infarction.  Continued surveillance warranted.  MRI could be helpful in further  evaluation.  Original Report Authenticated By: Staci Righter, M.D.   Ct Chest Wo Contrast  09/25/2011  *RADIOLOGY REPORT*  Clinical Data: Evaluate pneumonia.  CT CHEST WITHOUT CONTRAST  Technique:  Multidetector CT imaging of the chest was performed following the standard protocol without IV contrast.  Comparison: 09/24/2011 chest x-ray.  Findings: Obstruction of the right lower lobe bronchus with atelectasis and right-sided pleural effusion.  Hazy parenchymal changes posterior inferior right upper lobe.  Mediastinal shift to the right. Question mucus plugging versus bronchial obstructing lesion.  Coronary artery calcifications.  Dilated ascending thoracic aorta measuring up to 3.7 cm.  Fatty containing slightly prompt size pretracheal lymph node.  Right renal cyst.  Probable gallstone.  IMPRESSION: Obstruction of the right lower lobe bronchus with atelectasis and right-sided pleural effusion.   Question mucus plug.  Please see above.  Critical Value/emergent results were called by telephone at the time of interpretation on 09/25/2011  at 2:40 a.m.  to  [facility] patient's nurse, who verbally acknowledged these results.  Original Report Authenticated By: Doug Sou, M.D.   Dg Chest Port 1 View  09/26/2011  *RADIOLOGY REPORT*  Clinical Data: Assess right lower lobe collapse.  PORTABLE CHEST - 1 VIEW  Comparison: 09/25/2011  Findings: Right lower lobe atelectasis and probable right effusion again noted, not significantly changed.  Left lung is clear.  Heart is normal size.  IMPRESSION: No significant change right lower lobe atelectasis and effusion.  Original Report Authenticated By: Raelyn Number, M.D.   Dg Chest Port 1 View  09/25/2011  *RADIOLOGY REPORT*  Clinical Data: Evaluate collapse of right lower lobe  PORTABLE CHEST - 1 VIEW  Comparison: Portable chest x-ray of 09/24/2011 and CT chest of the same date  Findings: There is little change in opacity at the right lung base, with persistent collapse of  the right lower lobe as noted on recent CT. Some of the opacity appears to be due to a small right pleural effusion.  The left lung is clear.  Heart size is stable.  IMPRESSION: No change in right lower lobe atelectasis and right effusion.  Original Report Authenticated By: Joretta Bachelor, M.D.    Assessment and Plan: Patient Active Hospital Problem List: Subarachnoid hemorrhage (09/17/2011)   Assessment: Subarachnoid hemorrhage grade 2 due to rupture of right posterior to indicating aneurysm status post coiling    Plan: tcd mild vaso M, HHH, nimodipine planned 21 days CT reviewed, drain changes noted Improved clinically  Acute respiratory failure with hypoxia (09/17/2011)   Assessment: Respiratory failure off vent, collapse RLL, asp event likely , PNA, small effusion collapse Bronch - RLL lavaged, improved aeration rt 4/11 Plan: pcxr this am effusion noted, ATX Will [place] rt chest as has had pos balance with HHH and effusion may now be a contributer to [place] May need thora, will decide and perform today if needed IPBB, flutter, IS S/o mucomysts, hold further pcxr in am  Ambulation S/o abx course, follow closely, may need extended course abx  We will continue to follow this weekend  Lavon Paganini. Titus Mould, MD, [place] Pgr: [phone]  Pulmonary &  Critical Care

## 2011-09-28 NOTE — Progress Notes (Signed)
Physical Therapy Treatment Patient Details Name: Sean Pope MRN: [id number] DOB: 19-Mar-1946 Today's Date: 09/28/2011  PT Assessment/Plan  PT - Assessment/Plan Comments on Treatment Session: Pt s/p bronchoscopy yesterday and has not ambulated in a couple days. Pt limited this session by dizziness at first and then decreased O2 throughout ambulation. Ambulated without O2, encouraged pursed lip breathing throughout treatment. With deep breathing and rests, O2s maintained at 87-89 on RA.  PT Plan: Discharge plan remains appropriate;Frequency remains appropriate PT Frequency: Min 4X/week Follow Up Recommendations: Home health PT;Outpatient PT Equipment Recommended: None recommended by PT PT Goals  Acute Rehab PT Goals PT Goal Formulation: With patient/family PT Goal: Supine/Side to Sit - Progress: Met PT Goal: Sit to Supine/Side - Progress: Met PT Goal: Sit to Stand - Progress: Progressing toward goal PT Goal: Stand to Sit - Progress: Progressing toward goal PT Transfer Goal: Bed to Chair/Chair to Bed - Progress: Progressing toward goal PT Goal: Ambulate - Progress: Progressing toward goal  PT Treatment Precautions/Restrictions  Precautions Precautions: Other (comment) (IVC drain) Required Braces or Orthoses DO NOT USE: No Restrictions Weight Bearing Restrictions: No Mobility (including Balance) Bed Mobility Bed Mobility: Yes Supine to Sit: 6: Modified independent (Device/Increase time) Sitting - Scoot to Edge of Bed: 6: Modified independent (Device/Increase time) Transfers Transfers: Yes Sit to Stand: 4: Min assist;From bed;With upper extremity assist Sit to Stand Details (indicate cue type and reason): VC for hand placement as pt wanted to grab onto IV pole. Min assist for stability into standing Stand to Sit: 4: Min assist;With upper extremity assist;To chair/3-in-1 Stand to Sit Details: Min assist for controlled descent into chair Ambulation/Gait Ambulation/Gait:  Yes Ambulation/Gait Assistance: 4: Min assist Ambulation/Gait Assistance Details (indicate cue type and reason): Min assist for stability. CUes throughout for sequencing. Pt wanted to use IV pole for stability although encouraged to only use PT hand when necessary. Held PT hand but did not bear weight through it. Ambulation Distance (Feet): 150 Feet Assistive device: 1 person hand held assist Gait Pattern: Step-through pattern Gait velocity: slow gait speed    Exercise    End of Session PT - End of Session Equipment Utilized During Treatment: Gait belt;Other (comment) Activity Tolerance: Patient limited by fatigue;Treatment limited secondary to medical complications (Comment) (decreased O2 ) Patient left: in chair;with call bell in reach;with family/visitor present Nurse Communication: Mobility status for ambulation General Behavior During Session: [facility] for tasks performed Cognition: [facility] for tasks performed  Ambrose Finland 09/28/2011, 10:27 AM  09/28/2011 Ambrose Finland DPT PAGER: [phone] OFFICE: [phone]

## 2011-09-28 NOTE — Progress Notes (Signed)
Subjective:  Patient resting comfortably in bed, headache much improved. IVC draining well, about 10-15 cc per hour. CSF xanthochromic. CT this morning shows decreased ventricular size as compared to yesterday evening's study. Chest x-ray shows some increased aeration of right lower lobe, radiologist's interpretation is pending.  Objective: Vital signs in last 24 hours: Filed Vitals:   09/28/11 0300 09/28/11 0400 09/28/11 0500 09/28/11 0600  BP: 120/53 119/53 127/61 151/63  Pulse: 30 48 33 79  Temp:  98.3 F (36.8 C)    TempSrc:  Oral    Resp: 16 16 13 17   Height:      Weight:      SpO2: 97% 98% 98% 99%    Intake/Output from previous day: 04/11 0701 - 04/12 0700 In: 3075 [P.O.:150; I.V.:2875; IV Piggyback:50] Out: 3559 [Urine:3215; Drains:214] Intake/Output this shift: Total I/O In: 1405 [P.O.:30; I.V.:1375] Out: 7416 [Urine:1050; Drains:144]  Physical Exam:  Awake and alert, oriented. Pupils are equal round react to light. Extra ocular movements intact. Facial movement symmetrical. Moving all 4 extremities well.  Studies/Results: Ct Head Wo Contrast  09/27/2011  *RADIOLOGY REPORT*  Clinical Data: Evaluate for hydrocephalus.  Headache.  Drain closure today.  CT HEAD WITHOUT CONTRAST  Technique:  Contiguous axial images were obtained from the base of the skull through the vertex without contrast.  Comparison: 09/24/2011.  Findings: Right frontal ventriculostomy is present, unchanged in position and terminating with the tip near the foramen of Monro on the right.  The ventricles appear larger than on the prior exam of 09/24/2011 compatible with developing hydrocephalus.  There is rounding of the temporal horns of the lateral ventricles.  The left temporal horn appears more prominent than on the prior CT of 09/24/2011.  Residual subarachnoid hemorrhage is present on the right.  Minimal hypodensity in the left occipital lobe appears unchanged. Metallic density in the right MCA distribution  is compatible with coiling.  IMPRESSION: 1.  Mild enlargement of the lateral ventricles with rounding of the temporal horns is consistent with developing hydrocephalus. 2.  Residual subarachnoid hemorrhage over the posterior right frontal and parietal lobes. 3.  Postoperative changes of right MCA coiling.  Original Report Authenticated By: Dereck Ligas, M.D.   Dg Chest Port 1 View  09/27/2011  *RADIOLOGY REPORT*  Clinical Data: Bronchoscopy  PORTABLE CHEST - 1 VIEW  Comparison: 09/27/2011  Findings: Slight decrease in the right effusion.  Minimal improvement in right lower lobe collapse / consolidation.  No pneumothorax.  Stable left lung aeration.  No edema.  IMPRESSION: No pneumothorax following bronchoscopy.  Original Report Authenticated By: Jerilynn Mages. Daryll Brod, M.D.   Dg Chest Port 1 View  09/27/2011  *RADIOLOGY REPORT*  Clinical Data: Evaluate collapse  PORTABLE CHEST - 1 VIEW  Comparison: 09/26/2011; 09/25/2011; 09/24/2011; 09/19/2011; chest CT - 09/24/2011  Findings: Grossly unchanged cardiac silhouette and mediastinal contours.  Grossly unchanged right lower lung consolidative opacities and volume loss.  Unchanged small right-sided pleural effusion.  The left hemithorax is unchanged.  No pneumothorax. Unchanged bones.  IMPRESSION: Unchanged right lower lung consolidative opacities and volume loss. Given the stability of this finding, further evaluation with bronchoscopy (to evaluate for obstructing mass) may be performed as clinically indicated.  Original Report Authenticated By: Rachel Moulds, M.D.    Assessment/Plan: For TCDs today. We'll recheck CT without on Monday. Continue to encourage ambulation in ICU. Continues on Nimotop.   Hosie Spangle, MD 09/28/2011, 6:56 AM

## 2011-09-28 NOTE — Progress Notes (Signed)
Tolerated well, see full note Lavon Paganini. Titus Mould, MD, [place] Pgr: [place] [facility]

## 2011-09-29 ENCOUNTER — Inpatient Hospital Stay (HOSPITAL_COMMUNITY): Payer: Managed Care, Other (non HMO)

## 2011-09-29 NOTE — Progress Notes (Signed)
Name: Monroe Qin MRN: [id number] DOB: Aug 20, 1945    LOS: 9  PCCM PROGRESS NOTE  History of Present Illness: This is a 66 year old white smoker who presents with subarachnoid hemorrhage grade 2. Patient was taken to cerebral angiography and found to have a right P-comm aneurysm rupture. This was coiled by neuroradiology.   An intraventricular drain was placed on neurosurgery. There was dilation of the fourth ventricle seen on CT scan. Critical care medicine was asked to help with support this patient.  Lines / Drains: IVC drain 09/17/11>>> ETT 09/17/11>>>4/2  Cultures: 4/3 uc>>Neg 4/3 bc x 2>>neg 4/3 sputum >>> neg  I dont see thisin EMR?  Antibiotics: 4/3 maxipime (asp PNA)>>>stop date 11th  Tests / Events: NIMV 4/8 CT chest -RLL collapse, small assocaited effusion 4/10- no changes in pcxr, good clinical status 4/11- remains with collapse 4/11 Bronch- RLL mucous , lavaged 4/11 ct head- Mild enlargement of the lateral ventricles with rounding of the  temporal horns is consistent with developing hydrocephalus.  2. Residual subarachnoid hemorrhage over the posterior right  frontal and parietal lobes.  3. Postoperative changes of right MCA coiling 4/12- CT head- no change after clamping tube  Headache with cough, oob to chair  Vital Signs: Temp:  [98.7 F (37.1 C)-100 F (37.8 C)] 98.8 F (37.1 C) (04/10 0700) Pulse Rate:  [30-74] 67  (04/10 0900) Resp:  [15-22] 18  (04/10 0900) BP: (105-161)/(53-120) 139/61 mmHg (04/10 0900) SpO2:  [90 %-100 %] 90 % (04/10 0900)   Intake/Output Summary (Last 24 hours) at 09/26/11 1005 Last data filed at 09/26/11 0900  Gross per 24 hour  Intake   4235 ml  Output   1770 ml  Net   2465 ml   Physical Examination: General:  Patient in no distress awake, follows commands, ambulates Neuro:  awaken, right intraventricular drain HEENT: jvd wnl Cardiovascular:  Regular rate and rhythm without S3 normal S1-S2 Lungs: Improved but still  reduced rt Abdomen:  Soft nontender bowel sounds wnl   Labs and Imaging:  .  Lab 09/24/11 0350 09/23/11 0355 09/22/11 0600 09/20/11 0400  NA 134* 136 140 138  K 4.2 4.4 -- --  CL 104 105 108 106  CO2 22 22 23 22   GLUCOSE 96 104* 108* 128*  BUN 20 19 15 17   CREATININE 0.82 0.83 0.82 0.90  CALCIUM 8.5 8.5 8.6 8.4  MG -- 1.9 1.8 1.8  PHOS -- 3.2 3.1 2.9    Lab 09/24/11 0350 09/23/11 0355 09/22/11 0600  HGB 10.4* 10.3* 11.3*  HCT 31.6* 30.5* 33.6*  WBC 11.3* 12.9* 12.6*  PLT 318 283 276   pCXR - RLL infx/ effusion/ atx  Assessment and Plan: Patient Active Hospital Problem List: Subarachnoid hemorrhage (09/17/2011)   Assessment: Subarachnoid hemorrhage grade 2 due to rupture of right posterior to indicating aneurysm status post coiling    Plan: tcd mild vaso M, HHH, nimodipine planned 21 days Improved clinically  Acute respiratory failure with hypoxia (09/17/2011)   Assessment: Respiratory failure off vent, collapse RLL, asp event likely , PNA, small effusion collapse Bronch - RLL lavaged, improved aeration rt 4/11 Plan: IPBB, flutter, IS S/o mucomysts, hold further Ambulation may need extended course abx  We will resume FU on Monday, pl call as needed  Kara Mead MD. [facility]. [place] [facility] Pager [phone] If no response call [phone]

## 2011-09-29 NOTE — Progress Notes (Signed)
Patient ID: Sean Pope, male   DOB: 1945-11-02, 66 y.o.   MRN: [id number] Subjective:  The patient is alert and pleasant. He looks well. He denies headaches. He has no complaints.  Objective: Vital signs in last 24 hours: Temp:  [98.2 F (36.8 C)-101.2 F (38.4 C)] 99.2 F (37.3 C) (04/13 0400) Pulse Rate:  [31-100] 62  (04/13 0600) Resp:  [14-24] 16  (04/13 0600) BP: (101-165)/(45-124) 124/64 mmHg (04/13 0600) SpO2:  [90 %-100 %] 90 % (04/13 0600)  Intake/Output from previous day: 04/12 0701 - 04/13 0700 In: 2410 [P.O.:535; I.V.:1875] Out: 1001 [Urine:741; Drains:259; Stool:1] Intake/Output this shift:    Physical exam the patient is alert and oriented x3. He is moving all 4 extremities well. His pupils are equal.  Lab Results: No results found for this basename: WBC:2,HGB:2,HCT:2,PLT:2 in the last 72 hours BMET No results found for this basename: NA:2,K:2,CL:2,CO2:2,GLUCOSE:2,BUN:2,CREATININE:2,CALCIUM:2 in the last 72 hours  Studies/Results: Ct Head Wo Contrast  09/28/2011  *RADIOLOGY REPORT*  Clinical Data: Follow-up ventricular size.  Drainage catheter has been clamped.  CT HEAD WITHOUT CONTRAST  Technique:  Contiguous axial images were obtained from the base of the skull through the vertex without contrast.  Comparison: 09/24/2011.  Most recent 09/27/2011.  Findings: The mild increase in ventricular size noted between 04/08 and 04/11 has stabilized.   No further increase and temporal horn prominence or biventricular diameter is observed.  Shunt catheter remains in good position.  Slight subarachnoid and intraventricular hemorrhage remains unchanged.  Mild edema surrounds the shunt catheter.  Slight hypodensity left occipital lobe is less visible on today's examination.  Correlate with visual field examination.  IMPRESSION: No increase in ventricular size following external drainage catheter clamping.  See comments above.  Original Report Authenticated By: Staci Righter, M.D.    Ct Head Wo Contrast  09/27/2011  *RADIOLOGY REPORT*  Clinical Data: Evaluate for hydrocephalus.  Headache.  Drain closure today.  CT HEAD WITHOUT CONTRAST  Technique:  Contiguous axial images were obtained from the base of the skull through the vertex without contrast.  Comparison: 09/24/2011.  Findings: Right frontal ventriculostomy is present, unchanged in position and terminating with the tip near the foramen of Monro on the right.  The ventricles appear larger than on the prior exam of 09/24/2011 compatible with developing hydrocephalus.  There is rounding of the temporal horns of the lateral ventricles.  The left temporal horn appears more prominent than on the prior CT of 09/24/2011.  Residual subarachnoid hemorrhage is present on the right.  Minimal hypodensity in the left occipital lobe appears unchanged. Metallic density in the right MCA distribution is compatible with coiling.  IMPRESSION: 1.  Mild enlargement of the lateral ventricles with rounding of the temporal horns is consistent with developing hydrocephalus. 2.  Residual subarachnoid hemorrhage over the posterior right frontal and parietal lobes. 3.  Postoperative changes of right MCA coiling.  Original Report Authenticated By: Dereck Ligas, M.D.   Dg Chest Port 1 View  09/28/2011  *RADIOLOGY REPORT*  Clinical Data: Assess collapse.  PORTABLE CHEST - 1 VIEW  Comparison: Chest x-ray 09/27/2011.  Findings: There continues to be an opacity throughout the right mid and lower lung, compatible with areas of atelectasis and/or consolidation within the right lower and right middle lobe.  The superimposed moderate right-sided pleural effusion is also present and unchanged.  Left lung appears clear.  No definite left pleural effusion.  Pulmonary vasculature is normal.  Heart size is within normal limits. Mediastinal contours are  unremarkable.  IMPRESSION: 1.  There continues to be extensive atelectasis and/or consolidation throughout the right lower  lobe and to a lesser extent the right middle lobe, with superimposed moderate right- sided pleural effusion.  Overall appearance is similar to numerous recent prior examinations.  Original Report Authenticated By: Etheleen Mayhew, M.D.   Dg Chest Port 1 View  09/27/2011  *RADIOLOGY REPORT*  Clinical Data: Bronchoscopy  PORTABLE CHEST - 1 VIEW  Comparison: 09/27/2011  Findings: Slight decrease in the right effusion.  Minimal improvement in right lower lobe collapse / consolidation.  No pneumothorax.  Stable left lung aeration.  No edema.  IMPRESSION: No pneumothorax following bronchoscopy.  Original Report Authenticated By: Jerilynn Mages. Daryll Brod, M.D.    Assessment/Plan: Status post subarachnoid hemorrhage day #12: The patient is doing well. We will continue to observe him in the ICU as he has a ventriculostomy.  LOS: 12 days     Khamron Gellert D 09/29/2011, 7:26 AM

## 2011-09-30 LAB — CBC
HCT: 31.5 % — ABNORMAL LOW (ref 39.0–52.0)
MCH: 30.5 pg (ref 26.0–34.0)
MCV: 90.8 fL (ref 78.0–100.0)
RDW: 14 % (ref 11.5–15.5)
WBC: 12.8 10*3/uL — ABNORMAL HIGH (ref 4.0–10.5)

## 2011-09-30 LAB — CULTURE, RESPIRATORY W GRAM STAIN: Gram Stain: NONE SEEN

## 2011-09-30 LAB — BASIC METABOLIC PANEL
BUN: 17 mg/dL (ref 6–23)
CO2: 23 mEq/L (ref 19–32)
Calcium: 8.9 mg/dL (ref 8.4–10.5)
Chloride: 103 mEq/L (ref 96–112)
Creatinine, Ser: 0.75 mg/dL (ref 0.50–1.35)
Glucose, Bld: 101 mg/dL — ABNORMAL HIGH (ref 70–99)

## 2011-09-30 NOTE — Progress Notes (Signed)
Patient ID: Sean Pope, male   DOB: 05-26-1946, 66 y.o.   MRN: [id number] Subjective:  The patient is alert and pleasant. He denies headaches. He looks well. He has no complaints.  Objective: Vital signs in last 24 hours: Temp:  [97.5 F (36.4 C)-99.3 F (37.4 C)] 98.1 F (36.7 C) (04/14 0400) Pulse Rate:  [49-68] 49  (04/14 0600) Resp:  [16-24] 19  (04/14 0700) BP: (110-154)/(50-94) 154/67 mmHg (04/14 0800) SpO2:  [92 %-98 %] 98 % (04/14 0700) Weight:  [81.9 kg (180 lb 8.9 oz)] 81.9 kg (180 lb 8.9 oz) (04/14 0400)  Intake/Output from previous day: 04/13 0701 - 04/14 0700 In: 1830 [P.O.:480; I.V.:1350] Out: 2614 [Urine:2450; Drains:164] Intake/Output this shift: Total I/O In: 50 [I.V.:50] Out: 11 [Drains:11]  Physical exam the patient is alert and oriented x3. He is moving all 4 extremities well. His ventriculostomy is functioning.  Lab Results:  [facility] 09/30/11 0505  WBC 12.8*  HGB 10.6*  HCT 31.5*  PLT 462*   BMET  Basename 09/30/11 0505  NA 135  K 4.0  CL 103  CO2 23  GLUCOSE 101*  BUN 17  CREATININE 0.75  CALCIUM 8.9    Studies/Results: Dg Chest Port 1 View  09/29/2011  *RADIOLOGY REPORT*  Clinical Data: Follow-up right lower lobe collapse.  Patient with respiratory failure following subarachnoid hemorrhage and cerebral aneurysm coiling.  PORTABLE CHEST - 1 VIEW  Comparison: 09/28/2011  Findings: The cardiomediastinal silhouette is unremarkable. A right pleural effusion and right lower lung atelectasis are not significantly changed. There is no evidence of pneumothorax. The left lung is clear. Little significant change is noted since the prior study.  IMPRESSION: Stable chest radiograph with right pleural effusion and right lower lung atelectasis.  Original Report Authenticated By: Lura Em, M.D.    Assessment/Plan: Status post subarachnoid hemorrhage day #13: The patient is doing well. I will raise his ventriculostomy this evening. I have answered  all his questions.  LOS: 13 days     Remigio Mcmillon D 09/30/2011, 8:24 AM

## 2011-10-01 ENCOUNTER — Inpatient Hospital Stay (HOSPITAL_COMMUNITY): Payer: Managed Care, Other (non HMO)

## 2011-10-01 MED ORDER — ALBUTEROL SULFATE (5 MG/ML) 0.5% IN NEBU: 2.5000 mg | INHALATION_SOLUTION | Freq: Two times a day (BID) | RESPIRATORY_TRACT | Status: DC

## 2011-10-01 NOTE — Progress Notes (Signed)
Subjective: Patient resting comfortably in bed. He has ambulated twice in the ICU today. Denies headache, feels he has less neck stiffness. Taking well PO. IVC drainage very minimal, IVC was raised last night to 25 cm water. CSF in return collection bag a pale xanthochromic color, much improved.  Objective: Vital signs in last 24 hours: Filed Vitals:   10/01/11 1554 10/01/11 1600 10/01/11 1700 10/01/11 1800  BP:  147/66 156/85 172/79  Pulse:  71 68 75  Temp: 97.2 F (36.2 C)     TempSrc: Oral     Resp:  20 22 16   Height:      Weight:      SpO2:  94% 94% 95%    Intake/Output from previous day: 04/14 0701 - 04/15 0700 In: 1200 [I.V.:1200] Out: 1709 [Urine:1650; Drains:59] Intake/Output this shift:    Physical Exam:  Awake, alert, and oriented. Speech is fluent. Good comprehension. Following commands. Pupils are equal round and reactive to light, and about 3 mm bilaterally. Extraocular movements intact. Facial movements symmetrical. Moving all 4 extremity is well. Neck with 1+ meningismus (much improved).  CBC  Basename 09/30/11 0505  WBC 12.8*  HGB 10.6*  HCT 31.5*  PLT 462*   BMET  Basename 09/30/11 0505  NA 135  K 4.0  CL 103  CO2 23  GLUCOSE 101*  BUN 17  CREATININE 0.75  CALCIUM 8.9    Studies/Results: Ct Head Wo Contrast  10/01/2011  *RADIOLOGY REPORT*  Clinical Data: Follow-up subarachnoid hemorrhage.  Weakness.  CT HEAD WITHOUT CONTRAST  Technique:  Contiguous axial images were obtained from the base of the skull through the vertex without contrast.  Comparison: 09/28/2011 and multiple previous  Findings: Right frontal ventriculostomy remains in place with its tip in the third ventricle.  Ventricular size is stable, mildly enlarged over baseline.  Small amount of blood previously seen layering in the occipital horns has lost its density.  Subarachnoid blood in the sulci on the right is becoming less dense.  No evidence of additional hemorrhage.  Aneurysm coils  on the right appear the same. Old left cerebellar infarction appears the same. No evidence of acute infarction.  IMPRESSION: Expected evolutionary findings of diminishing density of blood in the ventricles and subarachnoid spaces.  No change in ventricular size since the previous exam.  Original Report Authenticated By: Jules Schick, M.D.    Assessment/Plan: CT this morning shows good ventricular size. Neurologically stable. Laboratories looked good. Tolerating very minimal drainage from IVC. IVC DC'd, with sterile prep of exit site, and stapling close of exit site. Patient's wife given prescription for Nimotop: 30 mg capsules, 2 capsules every 4 hours, 120 capsules, no refills. She is going to begin to work on obtaining prescription. We'll continue to progress activity as tolerated, and recheck CT brain without later this week.   Hosie Spangle, MD 10/01/2011, 7:16 PM

## 2011-10-01 NOTE — Progress Notes (Signed)
Dr Rita Ohara notified of IVC drain being at 25cm H2O with no order or notes requesting that level. Dr Sherwood Gambler stated it was ok to leave at 37 and he would review CT scan and address the level later. Will continue to monitor. [place], [place] M

## 2011-10-01 NOTE — Progress Notes (Signed)
Physical Therapy Treatment Patient Details Name: Sean Pope MRN: [id number] DOB: Apr 17, 1946 Today's Date: 10/01/2011  PT Assessment/Plan  PT - Assessment/Plan Comments on Treatment Session: Pt with increased mobility and balance although still limited by decreased sats during ambulation. Pt ambulated on RA, encouraged to perform pursed lip breathing throughout ambulation. Second half of ambulation, pt was able to maintain 88-8 percent. PT Plan: Frequency needs to be updated;Discharge plan needs to be updated PT Frequency: Min 3X/week Follow Up Recommendations: Outpatient PT;Supervision - Intermittent Equipment Recommended: None recommended by PT PT Goals  Acute Rehab PT Goals PT Goal Formulation: With patient/family Pt will go Supine/Side to Sit: Independently PT Goal: Supine/Side to Sit - Progress: Goal set today Pt will go Sit to Supine/Side: Independently PT Goal: Sit to Supine/Side - Progress: Goal set today Pt will go Sit to Stand: Independently PT Goal: Sit to Stand - Progress: Goal set today Pt will go Stand to Sit: Independently PT Goal: Stand to Sit - Progress: Goal set today Pt will Transfer Bed to Chair/Chair to Bed: with modified independence PT Transfer Goal: Bed to Chair/Chair to Bed - Progress: Goal set today Pt will Ambulate: >150 feet;with modified independence PT Goal: Ambulate - Progress: Goal set today Pt will Go Up / Down Stairs: Flight;with supervision;with rail(s) PT Goal: Up/Down Stairs - Progress: Goal set today  PT Treatment Precautions/Restrictions  Precautions Precautions: Other (comment) (IVC drain) Required Braces or Orthoses DO NOT USE: No Restrictions Weight Bearing Restrictions: No Mobility (including Balance) Bed Mobility Bed Mobility: Yes Supine to Sit: 6: Modified independent (Device/Increase time) Sitting - Scoot to Edge of Bed: 6: Modified independent (Device/Increase time) Sit to Supine: 6: Modified independent (Device/Increase  time) Transfers Transfers: Yes Sit to Stand: 5: Supervision;With upper extremity assist;From bed Sit to Stand Details (indicate cue type and reason): VC for hand placement for safety standing. Cues for anterior translation Stand to Sit: 5: Supervision;With upper extremity assist;To bed Stand to Sit Details: VC for controlled descent Ambulation/Gait Ambulation/Gait: Yes Ambulation/Gait Assistance: 5: Supervision Ambulation/Gait Assistance Details (indicate cue type and reason): Pt able to ambulate without holding onto anything with only supervision, but felt more comfortable holding onto IV during ambulation. Rest break taken in between laps around the unit due to decreased sats(pt non-symptomatic) Ambulation Distance (Feet): 600 Feet Assistive device: Other (Comment) (IV pole) Gait Pattern: Within Functional Limits Gait velocity: slow gait speed Stairs: No    Exercise    End of Session PT - End of Session Equipment Utilized During Treatment: Gait belt;Other (comment) (IVC clamped) Activity Tolerance: Patient tolerated treatment well Patient left: in bed;with call bell in reach Nurse Communication: Mobility status for ambulation General Behavior During Session: [facility] for tasks performed Cognition: [facility] for tasks performed  Ambrose Finland 10/01/2011, 3:50 PM  10/01/2011 Ambrose Finland DPT PAGER: [phone] OFFICE: [phone]

## 2011-10-01 NOTE — Progress Notes (Signed)
Name: Sean Pope MRN: [id number] DOB: 1946/03/24    LOS: 9  PCCM PROGRESS NOTE  History of Present Illness: This is a 66 year old white smoker who presents with subarachnoid hemorrhage grade 2. Patient was taken to cerebral angiography and found to have a right P-comm aneurysm rupture. This was coiled by neuroradiology.   An intraventricular drain was placed on neurosurgery. There was dilation of the fourth ventricle seen on CT scan. Critical care medicine was asked to help with support this patient.  Lines / Drains: IVC drain 09/17/11>>> ETT 09/17/11>>>4/2  Cultures: 4/3 uc>>Neg 4/3 bc x 2>>neg   Antibiotics: 4/3 maxipime (asp PNA)>>>stop date 11th  Tests / Events: 4/8 CT chest -RLL collapse, small assocaited effusion 4/10- no changes in pcxr, good clinical status 4/11- remains with collapse 4/11 Bronch- RLL mucous , lavaged 4/11 ct head- Mild enlargement of the lateral ventricles with rounding of the  temporal horns is consistent with developing hydrocephalus.  2. Residual subarachnoid hemorrhage over the posterior right  frontal and parietal lobes.  3. Postoperative changes of right MCA coiling 4/12- CT head- no change after clamping tube  SUBJ:  Looks great. Cognition intact. No distress  Vital Signs: Temp:  [98.7 F (37.1 C)-100 F (37.8 C)] 98.8 F (37.1 C) (04/10 0700) Pulse Rate:  [30-74] 67  (04/10 0900) Resp:  [15-22] 18  (04/10 0900) BP: (105-161)/(53-120) 139/61 mmHg (04/10 0900) SpO2:  [90 %-100 %] 90 % (04/10 0900)   Intake/Output Summary (Last 24 hours) at 09/26/11 1005 Last data filed at 09/26/11 0900  Gross per 24 hour  Intake   4235 ml  Output   1770 ml  Net   2465 ml   Physical Examination: General:  Patient in no distress awake, follows commands, ambulates Neuro:  awaken, right intraventricular drain HEENT: jvd wnl Cardiovascular:  Regular rate and rhythm without S3 normal S1-S2 Lungs: CTAP Abdomen:  Soft nontender bowel sounds  wnl   Labs and Imaging:  .  Lab 09/24/11 0350 09/23/11 0355 09/22/11 0600 09/20/11 0400  NA 134* 136 140 138  K 4.2 4.4 -- --  CL 104 105 108 106  CO2 22 22 23 22   GLUCOSE 96 104* 108* 128*  BUN 20 19 15 17   CREATININE 0.82 0.83 0.82 0.90  CALCIUM 8.5 8.5 8.6 8.4  MG -- 1.9 1.8 1.8  PHOS -- 3.2 3.1 2.9    Lab 09/24/11 0350 09/23/11 0355 09/22/11 0600  HGB 10.4* 10.3* 11.3*  HCT 31.6* 30.5* 33.6*  WBC 11.3* 12.9* 12.6*  PLT 318 283 276   pCXR - No new CXR  Assessment and Plan: Patient Active Hospital Problem List: Subarachnoid hemorrhage (09/17/2011) Mgmt per NS.   Acute respiratory failure with hypoxia (09/17/2011) due to collapse RLL,poss PNA, small effusion Clinically much improved. No further indication for IPPB and other measures. Best Rx @ this point is mobilization and frequent cough    PCCM will sign off. Please call if we can be of further assistance  Merton Border, MD;  PCCM service; [place] [phone]

## 2011-10-02 NOTE — Progress Notes (Signed)
VASCULAR LAB    TCD completed.  See original note from 09-18-11 for today's results.  Judithann Sauger, RVT 10/02/2011, 4:03 PM

## 2011-10-02 NOTE — Progress Notes (Signed)
Occupational Therapy Treatment Patient Details Name: Sean Pope MRN: [id number] DOB: January 10, 1946 Today's Date: 10/02/2011  OT Assessment/Plan OT Assessment/Plan Comments on Treatment Session: Pt demonstrates significant improvements.  Pt. is very eager to return to work at discharge, and will likely require clear instruction when he can return.  Would recommend an abbreviated schedule when he is medically cleared.   Pt. will likely discharge home at an independent level with basic self care activities.   Still unsure if pt. may have some deficits with high level cogntition - multi tasking/executive functions.  Will continue to asses OT Plan: Discharge plan remains appropriate OT Frequency: Min 2X/week Follow Up Recommendations: Outpatient OT;Supervision/Assistance - 24 hour (initially) Equipment Recommended: None recommended by OT OT Goals ADL Goals ADL Goal: Grooming - Progress: Progressing toward goals ADL Goal: Lower Body Bathing - Progress: Progressing toward goals ADL Goal: Lower Body Dressing - Progress: Progressing toward goals Pt Will Transfer to Toilet: with modified independence ADL Goal: Toilet Transfer - Progress: Progressing toward goals ADL Goal: Tub/Shower Transfer - Progress: Progressing toward goals ADL Goal: Additional Goal #1 - Progress: Progressing toward goals Miscellaneous OT Goals OT Goal: Miscellaneous Goal #1 - Progress: Met  OT Treatment Precautions/Restrictions  Precautions Precautions: None Restrictions Weight Bearing Restrictions: No   ADL ADL Grooming: Performed;Wash/dry hands;Supervision/safety Where Assessed - Grooming: Standing at sink Lower Body Dressing: Performed;Set up Where Assessed - Lower Body Dressing: Sit to stand from bed Toilet Transfer: Performed;Supervision/safety Toilet Transfer Details (indicate cue type and reason): supervision only due to lines Toilet Transfer Method: Ambulating Toilet Transfer Equipment: Regular height  toilet;Grab bars Toileting - Clothing Manipulation: Performed;Independent Where Assessed - Toileting Clothing Manipulation: Standing Toileting - Hygiene: Performed;Independent Where Assessed - Toileting Hygiene: Sit to stand from 3-in-1 or toilet Ambulation Related to ADLs: Pt. ambulates with supervision for lines only.   ADL Comments: Pt. very talkative.  Discussed pacing strategies with pt, especially after Discharge in relation to return to work and return to community activities.  Pt. very eager and focused on return to work.   Mobility  Bed Mobility Bed Mobility: Yes Supine to Sit: 7: Independent Sitting - Scoot to Edge of Bed: 7: Independent Sit to Supine: 7: Independent Transfers Transfers: Yes Sit to Stand: 7: Independent Stand to Sit: 7: Independent Exercises    End of Session OT - End of Session Activity Tolerance: Patient tolerated treatment well Patient left: in bed;with call bell in reach General Behavior During Session: [facility] for tasks performed Cognition: [facility] for tasks performed Cognitive Impairment: Pt. able to recall events of day, and details of info. therapist shared at beginning of session.  Pt. questionably a bit disinhibited with a mildly impaired awareness of social cues.  Vision appears intact     Sean Pope M  10/02/2011, 5:08 PM

## 2011-10-02 NOTE — Progress Notes (Signed)
Subjective:  Patient sitting up in bed without complaints. Scalp incision is clean and dry.  Objective: Vital signs in last 24 hours: Filed Vitals:   10/02/11 0400 10/02/11 0500 10/02/11 0600 10/02/11 0700  BP: 159/85 137/67 134/65 169/72  Pulse: 52 48 33 29  Temp: 97.9 F (36.6 C)     TempSrc: Oral     Resp:   19 17  Height:      Weight:      SpO2: 98% 96% 99% 100%    Intake/Output from previous day: 04/15 0701 - 04/16 0700 In: 1440 [P.O.:240; I.V.:1200] Out: 2400 [Urine:2400] Intake/Output this shift:    Physical Exam:  Awake, alert, oriented. Following commands. Speech fluent. Pupils equal round and reactive to light. Extraocular movements intact. Facial movements symmetrical. No drift of upper extremities.  CBC  Basename 09/30/11 0505  WBC 12.8*  HGB 10.6*  HCT 31.5*  PLT 462*   BMET  Basename 09/30/11 0505  NA 135  K 4.0  CL 103  CO2 23  GLUCOSE 101*  BUN 17  CREATININE 0.75  CALCIUM 8.9    Studies/Results: Ct Head Wo Contrast  10/01/2011  *RADIOLOGY REPORT*  Clinical Data: Follow-up subarachnoid hemorrhage.  Weakness.  CT HEAD WITHOUT CONTRAST  Technique:  Contiguous axial images were obtained from the base of the skull through the vertex without contrast.  Comparison: 09/28/2011 and multiple previous  Findings: Right frontal ventriculostomy remains in place with its tip in the third ventricle.  Ventricular size is stable, mildly enlarged over baseline.  Small amount of blood previously seen layering in the occipital horns has lost its density.  Subarachnoid blood in the sulci on the right is becoming less dense.  No evidence of additional hemorrhage.  Aneurysm coils on the right appear the same. Old left cerebellar infarction appears the same. No evidence of acute infarction.  IMPRESSION: Expected evolutionary findings of diminishing density of blood in the ventricles and subarachnoid spaces.  No change in ventricular size since the previous exam.  Original  Report Authenticated By: Jules Schick, M.D.    Assessment/Plan: Stable following removal of IVC last night. We'll check CT brain without and a.m. Encouraged to ambulate.   Hosie Spangle, MD 10/02/2011, 7:54 AM

## 2011-10-03 ENCOUNTER — Inpatient Hospital Stay (HOSPITAL_COMMUNITY): Payer: Managed Care, Other (non HMO)

## 2011-10-03 NOTE — Progress Notes (Signed)
Brief Nutrition Note: Pt admitted 09/17/11 for aneurysmal SAH. Pt s/p placement of right frontal intraventricular catheter and coiling. Pt 5'10" 178 lbs. BMI: 25.6 Pt on Regular diet. Per pt he has had no recent wt changes. Eating well on Regular diet, likes menu/food.  No nutrition interventions at this time.  Please re-consult as needed.  Kenneth, [place]

## 2011-10-03 NOTE — Progress Notes (Signed)
Physical Therapy Treatment Patient Details Name: Sean Pope MRN: [id number] DOB: [birth year] Today's Date: 10/03/2011  PT Assessment/Plan  PT - Assessment/Plan Comments on Treatment Session: Pt demonstrates improved balance and ambulation.  Patient scored a 21 on the dynamic gait index indicating he is not a risk for falls at this time.  Pt has met most goals however will continue to follow to make sure goals are maintained.  PT Plan: Discharge plan remains appropriate;Frequency remains appropriate PT Frequency: Min 3X/week Follow Up Recommendations: Outpatient PT;Supervision - Intermittent Equipment Recommended: None recommended by PT PT Goals  Acute Rehab PT Goals PT Goal Formulation: With patient/family Time For Goal Achievement: 2 weeks Pt will go Stand to Sit: Independently PT Goal: Stand to Sit - Progress: Met Pt will Ambulate: >150 feet;with modified independence PT Goal: Ambulate - Progress: Met Pt will Go Up / Down Stairs: Flight;with supervision;with rail(s) PT Goal: Up/Down Stairs - Progress: Met Pt will Perform Home Exercise Program: Independently PT Goal: Perform Home Exercise Program - Progress: Met  PT Treatment Precautions/Restrictions  Precautions Precautions: None Required Braces or Orthoses DO NOT USE: No Restrictions Weight Bearing Restrictions: No Mobility (including Balance) Bed Mobility Bed Mobility: No Transfers Transfers: Yes Stand to Sit: 7: Independent;To chair/3-in-1;With upper extremity assist;With armrests Ambulation/Gait Ambulation/Gait: Yes Ambulation/Gait Assistance: 6: Modified independent (Device/Increase time) Ambulation/Gait Assistance Details (indicate cue type and reason): Pt reports feeling that his legs are weak but demonstrated no instances of buckling or loss of balance while walking. Ambulation Pope (Feet): 600 Feet Assistive device: None Gait Pattern: Within Functional Limits Gait velocity: Decreased gait speed Stairs:  Yes Stairs Assistance: 5: Supervision Stairs Assistance Details (indicate cue type and reason): Pt requires supervision for safety. Stair Management Technique: One rail Left;Alternating pattern Number of Stairs: 8   Posture/Postural Control Posture/Postural Control: No significant limitations Balance Balance Assessed: No Dynamic Gait Index Level Surface: Normal Change in Gait Speed: Mild Impairment Gait with Horizontal Head Turns: Normal Gait with Vertical Head Turns: Normal Gait and Pivot Turn: Mild Impairment Step Over Obstacle: Normal Step Around Obstacles: Normal Steps: Mild Impairment Total Score: 21  Exercise  General Exercises - Lower Extremity Ankle Circles/Pumps: Strengthening;Both;10 reps;Seated Gluteal Sets: Strengthening;Both;5 reps;Seated (5 second hold) Long Arc Quad: Strengthening;Both;10 reps;Seated (5 second hold) Hip Flexion/Marching: Strengthening;Both;10 reps;Seated End of Session PT - End of Session Equipment Utilized During Treatment: Gait belt Activity Tolerance: Patient tolerated treatment well Patient left: in chair;with call bell in reach;with family/visitor present General Behavior During Session: [facility] for tasks performed Cognition: [facility] for tasks performed  Sean Pope 10/03/2011, 10:23 AM Antoine Poche, [place] DPT [phone]

## 2011-10-03 NOTE — Progress Notes (Signed)
Utilization review completed. Rozanna Boer, RN, BSN. 10/03/11

## 2011-10-03 NOTE — Progress Notes (Signed)
Subjective: Patient sitting up at bedside. Denies headache. Ambulating multiple times in the ICU. TCDs yesterday showed decreased velocities. CT this morning shows mild enlargement of temporal horns consistent with mild ventricular enlargement, but not clearly hydrocephalus. Continues on Nimotop.   Objective: Vital signs in last 24 hours: Filed Vitals:   10/03/11 0600 10/03/11 0643 10/03/11 0700 10/03/11 0800  BP: 154/133 144/70 131/66 121/56  Pulse: 63 31 34 70  Temp:    98.7 F (37.1 C)  TempSrc:    Oral  Resp: 20 21 19 21   Height:      Weight:      SpO2: 99%   90%    Intake/Output from previous day: 04/16 0701 - 04/17 0700 In: 1820 [P.O.:720; I.V.:1100] Out: 2677 [Urine:2675; Stool:2] Intake/Output this shift: Total I/O In: 50 [I.V.:50] Out: 740 [Urine:740]  Physical Exam:  Patient is awake, alert, oriented. Following commands. Speech is fluent. Moving all 4 extremity as well. No drift of upper extremities. Incisions are well-healed.   Studies/Results: Ct Head Wo Contrast  10/03/2011  *RADIOLOGY REPORT*  Clinical Data: 66 year old male status post ruptured right posterior communicating artery aneurysm and subsequent endovascular coil embolization.  CT HEAD WITHOUT CONTRAST  Technique:  Contiguous axial images were obtained from the base of the skull through the vertex without contrast.  Comparison: Head CTs 10/01/2011 and earlier.  Findings: Mild postoperative changes to the scalp status post external ventricular drain removal.  Small skin staples. Stable visualized osseous structures.  Visualized paranasal sinuses and mastoids are clear.  Visualized orbit soft tissues are within normal limits.  Small coil pack near the right clinoid process.  Small volume of gas in the right frontal horn.  Ventricle size is stable since 10/01/2011.  Hypodensity through the brain along the previous draining tract.  Trace residual subarachnoid hemorrhage in the right sylvian fissure.  No definite  intraventricular hemorrhage. The no new intracranial hemorrhage.  Chronic left cerebellar lacunar infarct.  No midline shift or mass effect. No evidence of cortically based acute infarction identified.  IMPRESSION: 1.  Stable ventricle size status post EVD removal. 2.  Trace residual subarachnoid hemorrhage in the right sylvian fissure. 3.  No new intracranial abnormality.  Original Report Authenticated By: Randall An, M.D.    Assessment/Plan: Stable 16 days following aneurysmal subarachnoid hemorrhage. Stable neurologically. Will continue on Nimotop for a total of 28 days. Will change IV to saline lock and monitor neurologic function. We'll allow patient to shower. Encouraged to ambulate.   Hosie Spangle, MD 10/03/2011, 8:47 AM

## 2011-10-04 NOTE — Progress Notes (Signed)
Occupational Therapy Treatment Patient Details Name: Sean Pope MRN: [id number] DOB: 1945/09/06 Today's Date: 10/04/2011  OT Assessment/Plan OT Assessment/Plan Comments on Treatment Session: Pt has met all goal and education complete. Plans to d/c home this afternoon. OT Plan: All goals met and education completed, patient discharged from OT services Follow Up Recommendations: Outpatient OT;Supervision/Assistance - 24 hour Equipment Recommended: None recommended by PT OT Goals ADL Goals Pt Will Perform Grooming: with modified independence;Standing at sink;Sitting at sink ADL Goal: Grooming - Progress: Met Pt Will Perform Lower Body Bathing: with modified independence;Sit to stand from chair;Sit to stand from bed ADL Goal: Lower Body Bathing - Progress: Met Pt Will Perform Lower Body Dressing: with modified independence;Sit to stand from chair;Sit to stand from bed ADL Goal: Lower Body Dressing - Progress: Met Pt Will Transfer to Toilet: with modified independence ADL Goal: Toilet Transfer - Progress: Met Pt Will Perform Tub/Shower Transfer: Shower transfer;Ambulation;Shower seat without back;with modified independence ADL Goal: Clinical cytogeneticist - Progress: Met Additional ADL Goal #1: Pt will be able to verbalize and demonstrate energy conservation techniques during ADLs 75% of time. ADL Goal: Additional Goal #1 - Progress: Met  OT Treatment Precautions/Restrictions  Precautions Precautions: None   ADL ADL Grooming: Simulated;Modified independent Grooming Details (indicate cue type and reason): Pt able to ambulate throughout room to gather grooming items.  Verbalized energy conservation and safety techniques. Where Assessed - Grooming: Standing at sink Lower Body Bathing: Simulated;Modified independent Lower Body Bathing Details (indicate cue type and reason): Able to gather items with mod I Where Assessed - Lower Body Bathing: Sit to stand from chair Lower Body Dressing:  Simulated;Modified independent Lower Body Dressing Details (indicate cue type and reason): Able to retrieve items with mod I Where Assessed - Lower Body Dressing: Sit to stand from chair Toilet Transfer: Simulated;Modified independent Toilet Transfer Method: Counselling psychologist: Other (comment) (chair) Ambulation Related to ADLs: Pt ambulated around unit with mod I.  Able to ambulate throughout room with mod I and no device in order to retrieve items.  ADL Comments: Pt verbalized 5 energy conservation techniques he plans to use during ADL activity at home. Mobility  Bed Mobility Bed Mobility: No Transfers Sit to Stand: 7: Independent;From chair/3-in-1 Stand to Sit: 7: Independent;To chair/3-in-1 Exercises    End of Session OT - End of Session Activity Tolerance: Patient tolerated treatment well Patient left: Other (comment) (ambulating in hallway ) General Behavior During Session: [facility] for tasks performed Cognition: [facility] for tasks performed   1:20 PM 10/04/2011 Darrol Jump OTR/L Pager [phone] Office [phone]

## 2011-10-04 NOTE — Progress Notes (Signed)
Patient has order to be discharged home. VSS, patient tolerating diet and ambulation, PIV removed. MD discussed plan with patient. Discharge education taught and understood, discharge paperwork complete. Care plan completed. Prescription given to family earlier this week by MD.  Patient to call MD office for appointment next week.  Family at bedside to take patient home. Patient discharged at 1302.

## 2011-10-04 NOTE — Discharge Summary (Signed)
Physician Discharge Summary  Patient ID: Sean Pope MRN: [id number] DOB/AGE: 12/06/1945 66 y.o.  Admit date: 09/17/2011 Discharge date: 10/04/2011  Admission Diagnoses: Subarachnoid hemorrhage from rupture of right posterior communicating artery aneurysm  Discharge Diagnoses: Subarachnoid hemorrhage from rupture of right posterior containing artery aneurysm, decreased volume of right lung lower lobe with associated effusion (improved) Principal Problem:  *Subarachnoid hemorrhage Active Problems:  Acute respiratory failure with hypoxia  Posterior communicating artery aneurysm  Altered mental status  Lung collapse   Discharged Condition: good  Hospital Course: Patient was brought to the hospital emergency room after having collapsed at home. He was evaluated by the emergency room physician and found to have a diffuse subarachnoid hemorrhage with a small amount of intraventricular hemorrhage and early dilation of the temporal horns. He was admitted and stabilized, and then taken to interventional radiology for cerebral angiography, where a right posterior indicating artery aneurysm was found. Endovascular coiling was performed by Dr. Noreene Filbert. He was able to coil the dome of the aneurysm, but did not fully obliterated the aneurysm. Repeat CT of the head revealed further enlargement of the cerebral ventricles consistent with hydrocephalus and therefore a right frontal intraventricular catheter was placed for decompression of hydrocephalus. The IVC was continued for over 2 weeks and then discontinued, and followup CT has shown mild ventriculomegaly, but not significant hydrocephalus at this time. We removed the sutures placed the time of the intraventricular catheter placement, but the staples placed when the IVC was removed are still in place, and we've asked the patient come in next week for staple removal.  The critical care medicine service was consulted to assist with his care  including ventilator management, he was weaned from the ventilator and extubated, but followup chest x-ray showed loss of volume of the right lung lower lobe as well as the fusion. Aggressive pulmonary toilet was ordered by CCM, but the right lower lobe collapse and diffusion persisted, and Dr. Merrie Roof performed bronchoscopy for direct pulmonary toilet, and the right lower lobe volume improved substantially.  Patient continued to improve from his stroke. He presented with 3+ meningismus, which has gradually diminished. He was started on Nimotop at the time of admission and continues on it at this time. He was given a prescription for 30 mg capsules to be taking 2 capsules every 4 hours for the next 10 days. A total of 120 capsules were prescribed, without refills. His family has already filled the prescription. He is not using any other medications at this time.  The patient and his family have been given instructions regarding wound care and activities. Their questions and answers for them.   Discharge Exam: Blood pressure 144/74, pulse 66, temperature 98 F (36.7 C), temperature source Oral, resp. rate 21, height 5' 10"  (1.778 m), weight 80.9 kg (178 lb 5.6 oz), SpO2 98.00%.  Disposition: Home   Medication List  As of 10/04/2011 12:40 PM   TAKE these medications         mulitivitamin with minerals Tabs   Take 1 tablet by mouth daily.           Follow-up Information    Follow up with Pcp Not In System .         Signed: Hosie Spangle, MD 10/04/2011, 12:40 PM

## 2011-10-09 ENCOUNTER — Other Ambulatory Visit: Payer: Self-pay | Admitting: Neurosurgery

## 2011-10-09 DIAGNOSIS — I609 Nontraumatic subarachnoid hemorrhage, unspecified: Secondary | ICD-10-CM

## 2011-10-10 ENCOUNTER — Ambulatory Visit
Admission: RE | Admit: 2011-10-10 | Discharge: 2011-10-10 | Disposition: A | Discharge status: Home / Self Care | Payer: Managed Care, Other (non HMO) | Source: Ambulatory Visit | Attending: Neurosurgery | Admitting: Neurosurgery

## 2011-10-10 DIAGNOSIS — I609 Nontraumatic subarachnoid hemorrhage, unspecified: Secondary | ICD-10-CM

## 2011-10-12 ENCOUNTER — Other Ambulatory Visit (HOSPITAL_COMMUNITY): Payer: Self-pay | Admitting: Neurosurgery

## 2011-10-12 DIAGNOSIS — I609 Nontraumatic subarachnoid hemorrhage, unspecified: Secondary | ICD-10-CM

## 2011-11-03 ENCOUNTER — Emergency Department (HOSPITAL_COMMUNITY)
Admission: EM | Admit: 2011-11-03 | Discharge: 2011-11-03 | Disposition: A | Discharge status: Home / Self Care | Payer: Managed Care, Other (non HMO) | Attending: Emergency Medicine | Admitting: Emergency Medicine

## 2011-11-03 ENCOUNTER — Emergency Department (HOSPITAL_COMMUNITY): Payer: Managed Care, Other (non HMO)

## 2011-11-03 ENCOUNTER — Encounter (HOSPITAL_COMMUNITY): Payer: Self-pay | Admitting: Emergency Medicine

## 2011-11-03 DIAGNOSIS — R42 Dizziness and giddiness: Secondary | ICD-10-CM | POA: Insufficient documentation

## 2011-11-03 DIAGNOSIS — Z8673 Personal history of transient ischemic attack (TIA), and cerebral infarction without residual deficits: Secondary | ICD-10-CM | POA: Insufficient documentation

## 2011-11-03 HISTORY — DX: Cerebral infarction, unspecified: I63.9

## 2011-11-03 LAB — POCT I-STAT, CHEM 8
BUN: 17 mg/dL (ref 6–23)
Calcium, Ion: 1.21 mmol/L (ref 1.12–1.32)
Chloride: 106 mEq/L (ref 96–112)

## 2011-11-03 NOTE — ED Notes (Signed)
Patient complaining of an episode of dizziness that occurred around 1700 this evening; patient was recently discharged from hospital (patient was inpatient for three weeks due to a stroke).  Patient denies any other symptoms at this time; states that the episode of dizziness has now gone away.  Patient alert and oriented x4; stroke scale negative.  No facial droop present.

## 2011-11-03 NOTE — Discharge Instructions (Signed)
Dizziness Dizziness is a common problem. It is a feeling of unsteadiness or lightheadedness. You may feel like you are about to faint. Dizziness can lead to injury if you stumble or fall. A person of any age group can suffer from dizziness, but dizziness is more common in older adults. CAUSES  Dizziness can be caused by many different things, including:  Middle ear problems.   Standing for too long.   Infections.   An allergic reaction.   Aging.   An emotional response to something, such as the sight of blood.   Side effects of medicines.   Fatigue.   Problems with circulation or blood pressure.   Excess use of alcohol, medicines, or illegal drug use.   Breathing too fast (hyperventilation).   An arrhythmia or problems with your heart rhythm.   Low red blood cell count (anemia).   Pregnancy.   Vomiting, diarrhea, fever, or other illnesses that cause dehydration.   Diseases or conditions such as Parkinson's disease, high blood pressure (hypertension), diabetes, and thyroid problems.   Exposure to extreme heat.  DIAGNOSIS  To find the cause of your dizziness, your caregiver may do a physical exam, lab tests, radiologic imaging scans, or an electrocardiography test (ECG).  TREATMENT  Treatment of dizziness depends on the cause of your symptoms and can vary greatly. HOME CARE INSTRUCTIONS   Drink enough fluids to keep your urine clear or pale yellow. This is especially important in very hot weather. In the elderly, it is also important in cold weather.   If your dizziness is caused by medicines, take them exactly as directed. When taking blood pressure medicines, it is especially important to get up slowly.   Rise slowly from chairs and steady yourself until you feel okay.   In the morning, first sit up on the side of the bed. When this seems okay, stand slowly while holding onto something until you know your balance is fine.   If you need to stand in one place for a  long time, be sure to move your legs often. Tighten and relax the muscles in your legs while standing.   If dizziness continues to be a problem, have someone stay with you for a day or two. Do this until you feel you are well enough to stay alone. Have the person call your caregiver if he or she notices changes in you that are concerning.   Do not drive or use heavy machinery if you feel dizzy.  SEEK IMMEDIATE MEDICAL CARE IF:   Your dizziness or lightheadedness gets worse.   You feel nauseous or vomit.   You develop problems with talking, walking, weakness, or using your arms, hands, or legs.   You are not thinking clearly or you have difficulty forming sentences. It may take a friend or family member to determine if your thinking is normal.   You develop chest pain, abdominal pain, shortness of breath, or sweating.   Your vision changes.   You notice any bleeding.   You have side effects from medicine that seems to be getting worse rather than better.  MAKE SURE YOU:   Understand these instructions.   Will watch your condition.   Will get help right away if you are not doing well or get worse.  Document Released: 11/28/2000 Document Revised: 05/24/2011 Document Reviewed: 12/22/2010 [facility] Patient Information 2012 [place].

## 2011-11-03 NOTE — ED Provider Notes (Signed)
History     CSN: [id number]  Arrival date & time 11/03/11  1448   First MD Initiated Contact with Patient 11/03/11 1900      Chief Complaint  Patient presents with  . Dizziness     HPI Dizziness Onset - today Duration - 20 minutes  It has resolved Worsened by - nothing Improved by - nothing  Pt denies headache/cp/sob No focal weakness No hearing changes No falls No visual changes No diaphoresis No syncope Reports "spinning" but now resolved  Pt reports h/o stroke last month but has since improved and has been at home No h/o dizziness previously   Family at bedside reports that he "looked panicky" but no focal weakness reported  Past Medical History  Diagnosis Date  . Stroke     History reviewed. No pertinent past surgical history.  History reviewed. No pertinent family history.  History  Substance Use Topics  . Smoking status: Former Smoker -- 0.5 packs/day for 50 years    Types: Cigarettes  . Smokeless tobacco: Not on file  . Alcohol Use: No      Review of Systems  Constitutional: Negative for fever.  Respiratory: Negative for shortness of breath.   Cardiovascular: Negative for chest pain.  All other systems reviewed and are negative.    Allergies  Review of patient's allergies indicates no known allergies.  Home Medications   Current Outpatient Rx  Name Route Sig Dispense Refill  . ADULT MULTIVITAMIN W/MINERALS CH Oral Take 1 tablet by mouth daily.      BP 186/87  Pulse 63  Temp(Src) 98 F (36.7 C) (Oral)  Resp 16  SpO2 96%  Physical Exam CONSTITUTIONAL: Well developed/well nourished HEAD AND FACE: Normocephalic/atraumatic EYES: EOMI/PERRL ENMT: Mucous membranes moist, no bruits NECK: supple no meningeal signs SPINE:entire spine nontender CV: S1/S2 noted, no murmurs/rubs/gallops noted LUNGS: Lungs are clear to auscultation bilaterally, no apparent distress ABDOMEN: soft, nontender, no rebound or guarding NEURO: Awake/alert,  facies symmetric, no arm or leg drift is noted Cranial nerves 3/4/5/6/12/24/08/11/12 tested and intact Gait normal No pastpointing EXTREMITIES: pulses normal, full ROM SKIN: warm, color normal PSYCH: no abnormalities of mood noted  ED Course  Procedures  8:02 PM Pt with h/o SAH s/p coiling He has done well since discharge from hospital He is scheduled to have ct angio head on 5/21 D/w dr Joya Salm on for nsgy, we agreed to get CT tonight Pt agreeable 9:18 PM Initial CT head negative Pt elected to go home and not have ct angio head due to cost and will f/u as outpatient He has no new symptoms, feels well at this time Discussed return precautions   MDM  Nursing notes reviewed and considered in documentation Previous records reviewed and considered All labs/vitals reviewed and considered        Date: 11/03/2011  Rate: 58  Rhythm: normal sinus rhythm  QRS Axis: normal  Intervals: normal  ST/T Wave abnormalities: normal  Conduction Disutrbances:none  Narrative Interpretation:   Old EKG Reviewed - no change Bigeminy noted in today's EKG and previous EKG  Sharyon Cable, MD 11/03/11 2120

## 2011-11-06 ENCOUNTER — Ambulatory Visit (HOSPITAL_COMMUNITY)
Admission: RE | Admit: 2011-11-06 | Discharge: 2011-11-06 | Disposition: A | Discharge status: Home / Self Care | Payer: Managed Care, Other (non HMO) | Source: Ambulatory Visit | Attending: Neurosurgery | Admitting: Neurosurgery

## 2011-11-06 DIAGNOSIS — I671 Cerebral aneurysm, nonruptured: Secondary | ICD-10-CM | POA: Insufficient documentation

## 2011-11-06 DIAGNOSIS — I609 Nontraumatic subarachnoid hemorrhage, unspecified: Secondary | ICD-10-CM

## 2011-11-06 DIAGNOSIS — I6789 Other cerebrovascular disease: Secondary | ICD-10-CM | POA: Insufficient documentation

## 2011-11-06 MED ORDER — IOHEXOL 350 MG/ML SOLN
50.0000 mL | Freq: Once | INTRAVENOUS | Status: AC | PRN
  Administered 2011-11-06: 50 mL via INTRAVENOUS

## 2011-12-19 ENCOUNTER — Other Ambulatory Visit (HOSPITAL_COMMUNITY): Payer: Self-pay | Admitting: Neurosurgery

## 2011-12-19 DIAGNOSIS — I609 Nontraumatic subarachnoid hemorrhage, unspecified: Secondary | ICD-10-CM

## 2012-01-02 ENCOUNTER — Ambulatory Visit (HOSPITAL_COMMUNITY)
Admission: RE | Admit: 2012-01-02 | Discharge: 2012-01-02 | Disposition: A | Discharge status: Home / Self Care | Payer: Managed Care, Other (non HMO) | Source: Ambulatory Visit | Attending: Neurosurgery | Admitting: Neurosurgery

## 2012-01-02 DIAGNOSIS — I609 Nontraumatic subarachnoid hemorrhage, unspecified: Secondary | ICD-10-CM | POA: Insufficient documentation

## 2012-01-14 ENCOUNTER — Telehealth (HOSPITAL_COMMUNITY): Payer: Self-pay

## 2012-01-14 ENCOUNTER — Other Ambulatory Visit (HOSPITAL_COMMUNITY): Payer: Self-pay | Admitting: Interventional Radiology

## 2012-01-14 DIAGNOSIS — I729 Aneurysm of unspecified site: Secondary | ICD-10-CM

## 2012-01-14 NOTE — Telephone Encounter (Signed)
I left  vm on Mr. Sean Pope asking for him to call in regards to setting up his apt.

## 2012-01-15 ENCOUNTER — Telehealth (HOSPITAL_COMMUNITY): Payer: Self-pay

## 2012-01-15 NOTE — Telephone Encounter (Signed)
I spoke with Caren Griffins and gave her the apt times at Dr. Carrie Mew office

## 2012-01-18 ENCOUNTER — Other Ambulatory Visit: Payer: Self-pay | Admitting: Radiology

## 2012-01-24 ENCOUNTER — Ambulatory Visit (HOSPITAL_COMMUNITY)
Admission: RE | Admit: 2012-01-24 | Discharge: 2012-01-24 | Disposition: A | Discharge status: Home / Self Care | Payer: Managed Care, Other (non HMO) | Source: Ambulatory Visit | Attending: Interventional Radiology | Admitting: Interventional Radiology

## 2012-01-24 ENCOUNTER — Encounter (HOSPITAL_COMMUNITY): Payer: Self-pay

## 2012-01-24 ENCOUNTER — Other Ambulatory Visit (HOSPITAL_COMMUNITY): Payer: Self-pay | Admitting: Interventional Radiology

## 2012-01-24 DIAGNOSIS — I729 Aneurysm of unspecified site: Secondary | ICD-10-CM

## 2012-01-24 DIAGNOSIS — F17200 Nicotine dependence, unspecified, uncomplicated: Secondary | ICD-10-CM | POA: Insufficient documentation

## 2012-01-24 DIAGNOSIS — I671 Cerebral aneurysm, nonruptured: Secondary | ICD-10-CM | POA: Insufficient documentation

## 2012-01-24 LAB — CBC WITH DIFFERENTIAL/PLATELET
Basophils Absolute: 0.1 10*3/uL (ref 0.0–0.1)
Basophils Relative: 1 % (ref 0–1)
Eosinophils Relative: 4 % (ref 0–5)
HCT: 38.9 % — ABNORMAL LOW (ref 39.0–52.0)
Hemoglobin: 13.1 g/dL (ref 13.0–17.0)
MCH: 30.1 pg (ref 26.0–34.0)
MCHC: 33.7 g/dL (ref 30.0–36.0)
MCV: 89.4 fL (ref 78.0–100.0)
Monocytes Absolute: 0.7 10*3/uL (ref 0.1–1.0)
Monocytes Relative: 9 % (ref 3–12)
RDW: 13.2 % (ref 11.5–15.5)

## 2012-01-24 LAB — BASIC METABOLIC PANEL
BUN: 21 mg/dL (ref 6–23)
CO2: 27 mEq/L (ref 19–32)
Chloride: 107 mEq/L (ref 96–112)
Creatinine, Ser: 1.05 mg/dL (ref 0.50–1.35)
Glucose, Bld: 95 mg/dL (ref 70–99)
Potassium: 4.2 mEq/L (ref 3.5–5.1)

## 2012-01-24 MED ORDER — FENTANYL CITRATE 0.05 MG/ML IJ SOLN
INTRAMUSCULAR | Status: AC
  Filled 2012-01-24: qty 2

## 2012-01-24 MED ORDER — HEPARIN SODIUM (PORCINE) 1000 UNIT/ML IJ SOLN
INTRAMUSCULAR | Status: AC | PRN
  Administered 2012-01-24 (×2): 500 [IU] via INTRAVENOUS

## 2012-01-24 MED ORDER — HYDROMORPHONE HCL PF 1 MG/ML IJ SOLN
INTRAMUSCULAR | Status: AC
  Filled 2012-01-24: qty 1

## 2012-01-24 MED ORDER — FENTANYL CITRATE 0.05 MG/ML IJ SOLN
INTRAMUSCULAR | Status: AC | PRN
  Administered 2012-01-24 (×2): 25 ug via INTRAVENOUS

## 2012-01-24 MED ORDER — HYDRALAZINE HCL 20 MG/ML IJ SOLN
INTRAMUSCULAR | Status: AC
  Filled 2012-01-24: qty 1

## 2012-01-24 MED ORDER — SODIUM CHLORIDE 0.9 % IV SOLN
Freq: Once | INTRAVENOUS | Status: AC
  Administered 2012-01-24: 09:00:00 via INTRAVENOUS

## 2012-01-24 MED ORDER — HYDRALAZINE HCL 20 MG/ML IJ SOLN
INTRAMUSCULAR | Status: AC | PRN
  Administered 2012-01-24 (×4): 5 mg via INTRAVENOUS

## 2012-01-24 MED ORDER — SODIUM CHLORIDE 0.9 % IV SOLN: INTRAVENOUS | Status: AC

## 2012-01-24 MED ORDER — MIDAZOLAM HCL 2 MG/2ML IJ SOLN
INTRAMUSCULAR | Status: AC
  Filled 2012-01-24: qty 4

## 2012-01-24 MED ORDER — IOHEXOL 300 MG/ML  SOLN
150.0000 mL | Freq: Once | INTRAMUSCULAR | Status: AC | PRN
  Administered 2012-01-24: 66 mL via INTRAVENOUS

## 2012-01-24 MED ORDER — MIDAZOLAM HCL 5 MG/5ML IJ SOLN
INTRAMUSCULAR | Status: AC | PRN
  Administered 2012-01-24 (×2): 1 mg via INTRAVENOUS

## 2012-01-24 NOTE — ED Notes (Signed)
X-oseal to right groin site with pressure dressing; 4x4's and clear covering

## 2012-01-24 NOTE — ED Notes (Signed)
Bilateral ped pulses 2+ via palpation.  Anterior palpated at 1

## 2012-01-24 NOTE — ED Notes (Signed)
Family updated as to patient's status.

## 2012-01-24 NOTE — H&P (Signed)
Sean Pope is an 66 y.o. male.   Chief Complaint: Previous posterior communicating artery aneurysm coiling 09/17/11 Pt has no complaints; no sxs Scheduled today for re check arteriogram HPI: CVA; ruptured PCOM aneurysm  Past Medical History  Diagnosis Date  . Stroke     No past surgical history on file.  No family history on file. Social History:  reports that he has quit smoking. His smoking use included Cigarettes. He has a 25 pack-year smoking history. He does not have any smokeless tobacco history on file. He reports that he does not drink alcohol or use illicit drugs.  Allergies: No Known Allergies   (Not in a hospital admission)  Results for orders placed during the hospital encounter of 01/24/12 (from the past 48 hour(s))  CBC WITH DIFFERENTIAL     Status: Abnormal   Collection Time   01/24/12  8:55 AM      Component Value Range Comment   WBC 8.5  4.0 - 10.5 K/uL    RBC 4.35  4.22 - 5.81 MIL/uL    Hemoglobin 13.1  13.0 - 17.0 g/dL    HCT 38.9 (*) 39.0 - 52.0 %    MCV 89.4  78.0 - 100.0 fL    MCH 30.1  26.0 - 34.0 pg    MCHC 33.7  30.0 - 36.0 g/dL    RDW 13.2  11.5 - 15.5 %    Platelets 315  150 - 400 K/uL    Neutrophils Relative 59  43 - 77 %    Neutro Abs 5.0  1.7 - 7.7 K/uL    Lymphocytes Relative 28  12 - 46 %    Lymphs Abs 2.3  0.7 - 4.0 K/uL    Monocytes Relative 9  3 - 12 %    Monocytes Absolute 0.7  0.1 - 1.0 K/uL    Eosinophils Relative 4  0 - 5 %    Eosinophils Absolute 0.3  0.0 - 0.7 K/uL    Basophils Relative 1  0 - 1 %    Basophils Absolute 0.1  0.0 - 0.1 K/uL    No results found.  Review of Systems  Constitutional: Negative for fever.  Cardiovascular: Negative for chest pain.  Gastrointestinal: Negative for nausea and vomiting.  Neurological: Negative for dizziness, focal weakness and headaches.    Blood pressure 200/108, pulse 60, temperature 98.3 F (36.8 C), temperature source Oral, resp. rate 18, height 6' (1.829 m), weight 175 lb (79.379  kg), SpO2 98.00%. Physical Exam  Constitutional: He is oriented to person, place, and time. He appears well-developed and well-nourished.  HENT:  Head: Normocephalic.  Eyes: EOM are normal.  Cardiovascular: Normal rate, regular rhythm and normal heart sounds.   No murmur heard. Respiratory: Effort normal and breath sounds normal. He has no wheezes.  GI: Soft. Bowel sounds are normal. There is no tenderness.  Musculoskeletal: Normal range of motion.  Neurological: He is alert and oriented to person, place, and time.  Psychiatric: He has a normal mood and affect. His behavior is normal. Judgment and thought content normal.     Assessment/Plan P COM aneurysm coil 09/17/11 Doing well Not smoking Scheduled for re check cerebral arteriogram today Pt and wife aware of procedure benefits and risks and agreeable to proceed Consent signed.  Aradhana Gin A 01/24/2012, 9:30 AM

## 2012-01-24 NOTE — Procedures (Signed)
S/P 4 vessel cerebral arteriogram. RT CFA approach. Preliminary findings  1. Previously obliterated rt PCom aneurysm with no coil compaction or recananilization. 2.Stable Rt PCom infundibulum.

## 2012-01-24 NOTE — ED Notes (Signed)
Pulses:  Ped pulses bilat 2+, posterior bilat unable to palpate but easily found with doppler.

## 2012-09-02 ENCOUNTER — Other Ambulatory Visit (HOSPITAL_COMMUNITY): Payer: Self-pay | Admitting: Interventional Radiology

## 2012-09-02 ENCOUNTER — Telehealth (HOSPITAL_COMMUNITY): Payer: Self-pay | Admitting: Interventional Radiology

## 2012-09-02 DIAGNOSIS — I671 Cerebral aneurysm, nonruptured: Secondary | ICD-10-CM

## 2012-09-02 DIAGNOSIS — I609 Nontraumatic subarachnoid hemorrhage, unspecified: Secondary | ICD-10-CM

## 2013-03-06 ENCOUNTER — Other Ambulatory Visit: Payer: Self-pay | Admitting: Family Medicine

## 2013-03-06 DIAGNOSIS — D649 Anemia, unspecified: Secondary | ICD-10-CM

## 2013-03-06 DIAGNOSIS — K625 Hemorrhage of anus and rectum: Secondary | ICD-10-CM

## 2013-03-06 DIAGNOSIS — R197 Diarrhea, unspecified: Secondary | ICD-10-CM

## 2013-03-11 ENCOUNTER — Ambulatory Visit
Admission: RE | Admit: 2013-03-11 | Discharge: 2013-03-11 | Disposition: A | Discharge status: Home / Self Care | Payer: Managed Care, Other (non HMO) | Source: Ambulatory Visit | Attending: Family Medicine | Admitting: Family Medicine

## 2013-03-11 DIAGNOSIS — R197 Diarrhea, unspecified: Secondary | ICD-10-CM

## 2013-03-11 DIAGNOSIS — K625 Hemorrhage of anus and rectum: Secondary | ICD-10-CM

## 2013-03-11 DIAGNOSIS — D649 Anemia, unspecified: Secondary | ICD-10-CM

## 2013-03-11 MED ORDER — IOHEXOL 300 MG/ML  SOLN
75.0000 mL | Freq: Once | INTRAMUSCULAR | Status: AC | PRN
  Administered 2013-03-11: 75 mL via INTRAVENOUS

## 2013-03-31 ENCOUNTER — Encounter: Payer: Self-pay | Admitting: Internal Medicine

## 2013-04-09 ENCOUNTER — Encounter: Payer: Self-pay | Admitting: Internal Medicine

## 2013-04-09 ENCOUNTER — Ambulatory Visit (INDEPENDENT_AMBULATORY_CARE_PROVIDER_SITE_OTHER): Payer: Managed Care, Other (non HMO) | Admitting: Internal Medicine

## 2013-04-09 VITALS — BP 138/90 | HR 68 | Ht 70.0 in | Wt 172.8 lb

## 2013-04-09 DIAGNOSIS — K625 Hemorrhage of anus and rectum: Secondary | ICD-10-CM

## 2013-04-09 DIAGNOSIS — D649 Anemia, unspecified: Secondary | ICD-10-CM | POA: Insufficient documentation

## 2013-04-09 DIAGNOSIS — R197 Diarrhea, unspecified: Secondary | ICD-10-CM | POA: Insufficient documentation

## 2013-04-09 DIAGNOSIS — R194 Change in bowel habit: Secondary | ICD-10-CM

## 2013-04-09 DIAGNOSIS — R198 Other specified symptoms and signs involving the digestive system and abdomen: Secondary | ICD-10-CM

## 2013-04-09 DIAGNOSIS — R12 Heartburn: Secondary | ICD-10-CM

## 2013-04-09 DIAGNOSIS — R933 Abnormal findings on diagnostic imaging of other parts of digestive tract: Secondary | ICD-10-CM

## 2013-04-09 MED ORDER — SOD PICOSULFATE-MAG OX-CIT ACD 10-3.5-12 MG-GM-GM PO PACK: 1.0000 | PACK | Freq: Once | ORAL | Status: DC

## 2013-04-09 NOTE — Assessment & Plan Note (Signed)
Await colonoscopy to see if chronic or transient

## 2013-04-09 NOTE — Progress Notes (Signed)
Referred by Kathyrn Lass, MD   Subjective:    Patient ID: Sean Pope, male    DOB: January 16, 1946, 67 y.o.   MRN: [id number]  HPI The patient is a 67 yo man without GI hx until he had a subarachnoid hemorrhage last year and as he was recovering developed diarrhea. Loose urgent stools. He then had a CT scan that showed thickened colon walls. He has had rectal bleeding and an anemia improving with iron supplementation. Dr. Sabra Heck had him stop losartan-HCTZ and the diarrhea is better. He does have 5-7 small soft-formed stools a day now. No prior colonoscopy. Had been healthy until subarachnoid hemorrhage.  He does also now have some intermittent and self-limited heartburn.  No Known Allergies Outpatient Prescriptions Prior to Visit  Medication Sig Dispense Refill  . simvastatin (ZOCOR) 40 MG tablet Take 40 mg by mouth every evening.      Marland Kitchen losartan-hydrochlorothiazide (HYZAAR) 50-12.5 MG per tablet Take 1 tablet by mouth daily.       No facility-administered medications prior to visit.   Past Medical History  Diagnosis Date  . Stroke   . SAH (subarachnoid hemorrhage)   . Hypertension   . Hyperlipidemia   . Lung collapse 09/26/2011  . Acute respiratory failure with hypoxia 09/17/2011  . Posterior communicating artery aneurysm 09/17/2011   Past Surgical History  Procedure Laterality Date  . Aneurysm coiling     History   Social History  . Marital Status: Married    Spouse Name: N/A    Number of Children: 2  . Years of Education: N/A   Occupational History  . Director    Social History Main Topics  . Smoking status: Former Smoker -- 0.50 packs/day for 50 years    Types: Cigarettes  . Smokeless tobacco: Never Used  . Alcohol Use: No  . Drug Use: No  . Sexual Activity: None   Other Topics Concern  . None   Social History Narrative   Married, 1 son and 1 daughter   TEFL teacher   1.5 caffeine drinks daily   Family History  Problem Relation  Age of Onset  . Heart disease Mother    Review of Systems This is positive for those things mentiones in the HPI, also positive for edema of feet and legs since stopping BP meds. All other review of systems are negative.      Objective:   Physical Exam General:  Well-developed, well-nourished and in no acute distress Eyes:  anicteric. ENT:   Mouth and posterior pharynx free of lesions.  Neck:   supple w/o thyromegaly or mass.  Lungs: Clear to auscultation bilaterally. Heart:  S1S2, no rubs, murmurs, gallops. Abdomen:  soft, non-tender, no hepatosplenomegaly, hernia, or mass and BS+.  Rectal: deferred Lymph:  no cervical or supraclavicular adenopathy. Extremities:   1+ bilateral edema to distal pre-tibial area Skin   no rash. Neuro:  A&O x 3.  Psych:  appropriate mood and  Affect.   Data Reviewed:  Dr. Ammie Ferrier notes, labs and CT scan      Assessment & Plan:  Diarrhea - Plan: Ambulatory referral to Gastroenterology  Abnormal CT scan, colon - Plan: Ambulatory referral to Gastroenterology  Change in bowel habits  Rectal bleeding  Heartburn  Anemia   I appreciate the opportunity to care for this patient.  TM:[id number],LNLG Jeani Hawking, MD

## 2013-04-09 NOTE — Assessment & Plan Note (Signed)
Monitor - reassess at colonoscopy

## 2013-04-09 NOTE — Patient Instructions (Signed)
You have been scheduled for a colonoscopy with propofol. Please follow written instructions given to you at your visit today.  Please use the sample of prepopik we have given you today. If you use inhalers (even only as needed), please bring them with you on the day of your procedure. Your physician has requested that you go to www.startemmi.com and enter the access code given to you at your visit today. This web site gives a general overview about your procedure. However, you should still follow specific instructions given to you by our office regarding your preparation for the procedure.  It is the time of year to have a vaccination to prevent the flu (influenza virus).  Please have this done through your primary care provider or you can get this done at local pharmacies or the [facility]. It would be very helpful if you notify your primary care provider when and where you had the vaccination given by messaging them in My Chart, leaving a message or faxing the information.   I appreciate the opportunity to care for you.

## 2013-04-09 NOTE — Assessment & Plan Note (Signed)
Better off losartan/HCTZ CT showed possible colitis Colonoscopy appropriate - try for terminal ileum intubation and would do random biopsies if NL Possible causes include microscopic colitis/IBD, drug effects.

## 2013-04-09 NOTE — Assessment & Plan Note (Signed)
Etiology not clear Could be re;ated to possible colitis Await colonoscopy

## 2013-04-14 ENCOUNTER — Encounter: Payer: Self-pay | Admitting: Internal Medicine

## 2013-04-15 ENCOUNTER — Other Ambulatory Visit: Payer: Self-pay | Admitting: Family Medicine

## 2013-04-15 DIAGNOSIS — R7989 Other specified abnormal findings of blood chemistry: Secondary | ICD-10-CM

## 2013-04-15 DIAGNOSIS — R609 Edema, unspecified: Secondary | ICD-10-CM

## 2013-04-20 ENCOUNTER — Ambulatory Visit
Admission: RE | Admit: 2013-04-20 | Discharge: 2013-04-20 | Disposition: A | Discharge status: Home / Self Care | Payer: Managed Care, Other (non HMO) | Source: Ambulatory Visit | Attending: Family Medicine | Admitting: Family Medicine

## 2013-04-20 DIAGNOSIS — R7989 Other specified abnormal findings of blood chemistry: Secondary | ICD-10-CM

## 2013-04-20 DIAGNOSIS — R609 Edema, unspecified: Secondary | ICD-10-CM

## 2013-05-28 ENCOUNTER — Encounter: Payer: Self-pay | Admitting: Internal Medicine

## 2013-05-28 ENCOUNTER — Ambulatory Visit (AMBULATORY_SURGERY_CENTER): Payer: Managed Care, Other (non HMO) | Admitting: Internal Medicine

## 2013-05-28 VITALS — BP 159/83 | HR 71 | Temp 97.4°F | Resp 15 | Ht 70.0 in | Wt 172.0 lb

## 2013-05-28 DIAGNOSIS — R197 Diarrhea, unspecified: Secondary | ICD-10-CM

## 2013-05-28 DIAGNOSIS — K5190 Ulcerative colitis, unspecified, without complications: Secondary | ICD-10-CM

## 2013-05-28 DIAGNOSIS — K5100 Ulcerative (chronic) pancolitis without complications: Secondary | ICD-10-CM | POA: Insufficient documentation

## 2013-05-28 DIAGNOSIS — R933 Abnormal findings on diagnostic imaging of other parts of digestive tract: Secondary | ICD-10-CM

## 2013-05-28 HISTORY — PX: COLONOSCOPY W/ BIOPSIES: SHX1374

## 2013-05-28 HISTORY — DX: Ulcerative colitis, unspecified, without complications: K51.90

## 2013-05-28 MED ORDER — MESALAMINE 800 MG PO TBEC: 1.0000 | DELAYED_RELEASE_TABLET | Freq: Three times a day (TID) | ORAL | Status: DC

## 2013-05-28 MED ORDER — SODIUM CHLORIDE 0.9 % IV SOLN: 500.0000 mL | INTRAVENOUS | Status: DC

## 2013-05-28 NOTE — Progress Notes (Signed)
Pt's BP high at discharge.  Pt states, "this is normal for me- I will take a BP pill when I get home."  Patient did not experience any of the following events: a burn prior to discharge; a fall within the facility; wrong site/side/patient/procedure/implant event; or a hospital transfer or hospital admission upon discharge from the facility. ([id number])Patient did not have preoperative order for IV antibiotic SSI prophylaxis. [phone])

## 2013-05-28 NOTE — Progress Notes (Signed)
Called to room to assist during endoscopic procedure.  Patient ID and intended procedure confirmed with present staff. Received instructions for my participation in the procedure from the performing physician.  

## 2013-05-28 NOTE — Progress Notes (Signed)
Report to pacu rn, vss, bbs=clear 

## 2013-05-28 NOTE — Patient Instructions (Addendum)
It looks like you have a condition called ulcerative colitis. I took biopsies and will let you know results. I am going to start a treatment called Asacol HD - prescription sent. Please start that soon.  I appreciate the opportunity to care for you. Gatha Mayer, MD, FACG   YOU HAD AN ENDOSCOPIC PROCEDURE TODAY AT [facility]: Refer to the procedure report that was given to you for any specific questions about what was found during the examination.  If the procedure report does not answer your questions, please call your gastroenterologist to clarify.  If you requested that your care partner not be given the details of your procedure findings, then the procedure report has been included in a sealed envelope for you to review at your convenience later.  YOU SHOULD EXPECT: Some feelings of bloating in the abdomen. Passage of more gas than usual.  Walking can help get rid of the air that was put into your GI tract during the procedure and reduce the bloating. If you had a lower endoscopy (such as a colonoscopy or flexible sigmoidoscopy) you may notice spotting of blood in your stool or on the toilet paper. If you underwent a bowel prep for your procedure, then you may not have a normal bowel movement for a few days.  DIET: Your first meal following the procedure should be a light meal and then it is ok to progress to your normal diet.  A half-sandwich or bowl of soup is an example of a good first meal.  Heavy or fried foods are harder to digest and may make you feel nauseous or bloated.  Likewise meals heavy in dairy and vegetables can cause extra gas to form and this can also increase the bloating.  Drink plenty of fluids but you should avoid alcoholic beverages for 24 hours.  ACTIVITY: Your care partner should take you home directly after the procedure.  You should plan to take it easy, moving slowly for the rest of the day.  You can resume normal activity the day after the procedure  however you should NOT DRIVE or use heavy machinery for 24 hours (because of the sedation medicines used during the test).    SYMPTOMS TO REPORT IMMEDIATELY: A gastroenterologist can be reached at any hour.  During normal business hours, 8:30 AM to 5:00 PM Monday through Friday, call [phone].  After hours and on weekends, please call the GI answering service at [phone] who will take a message and have the physician on call contact you.   Following lower endoscopy (colonoscopy or flexible sigmoidoscopy):  Excessive amounts of blood in the stool  Significant tenderness or worsening of abdominal pains  Swelling of the abdomen that is new, acute  Fever of 100F or higher FOLLOW UP: If any biopsies were taken you will be contacted by phone or by letter within the next 1-3 weeks.  Call your gastroenterologist if you have not heard about the biopsies in 3 weeks.  Our staff will call the home number listed on your records the next business day following your procedure to check on you and address any questions or concerns that you may have at that time regarding the information given to you following your procedure. This is a courtesy call and so if there is no answer at the home number and we have not heard from you through the emergency physician on call, we will assume that you have returned to your regular daily activities without  incident.  SIGNATURES/CONFIDENTIALITY: You and/or your care partner have signed paperwork which will be entered into your electronic medical record.  These signatures attest to the fact that that the information above on your After Visit Summary has been reviewed and is understood.  Full responsibility of the confidentiality of this discharge information lies with you and/or your care-partner.  Please read over handout about ulcerative colitis  Await pathology  Your prescription was sent to [facility] on [address]

## 2013-05-28 NOTE — Progress Notes (Signed)
Dr. Carlean Purl and Nicola Police made aware of pt'.s elevated BPs  Left 157/106, right 170/110, manual BP right arm 150/110.  Pt states that he Checks B/Ps daily average 130/75.  Stopped BP med 2 months ago per his PCP advice.  Has no symptoms related to hypertension.

## 2013-05-28 NOTE — Op Note (Signed)
[place]  [address] [place], [id number]   COLONOSCOPY PROCEDURE REPORT  PATIENT: Sean Pope, Rinkenberger  MR#: [id number] BIRTHDATE: 07-Feb-1946 , 83  yrs. old GENDER: Male ENDOSCOPIST: Gatha Mayer, MD, [facility] REFERRED [id number] Sabra Heck, M.D. PROCEDURE DATE:  05/28/2013 PROCEDURE:   Colonoscopy with biopsy First Screening Colonoscopy - Avg.  risk and is 50 yrs.  old or older - No.  Prior Negative Screening - Now for repeat screening. N/A  History of Adenoma - Now for follow-up colonoscopy & has been > or = to 3 yrs.  N/A  Polyps Removed Today? No.  Recommend repeat exam, <10 yrs? No. ASA CLASS:   Class II INDICATIONS:unexplained diarrhea and an abnormal CT. MEDICATIONS: propofol (Diprivan) 269m IV, MAC sedation, administered by CRNA, and These medications were titrated to patient response per physician's verbal order  DESCRIPTION OF PROCEDURE:   After the risks benefits and alternatives of the procedure were thoroughly explained, informed consent was obtained.  A digital rectal exam revealed no abnormalities of the rectum, A digital rectal exam revealed no prostatic nodules, and A digital rectal exam revealed the prostate was not enlarged.   The LB PFC-H190 [id number] endoscope was introduced through the anus and advanced to the terminal ileum which was intubated for a short distance. No adverse events experienced.   The quality of the prep was Suprep good  The instrument was then slowly withdrawn as the colon was fully examined.   COLON FINDINGS: Abnormal mucosa was found throughout the entire examined colon.  The mucosa had superficial ulcers and granularity. This was likely consistent with ulcerative colitis disease. Patchy in right colon, proximal left colon and confluent chnages in sigmoid and rectum. Multiple biopsies were performed using cold forceps.   The colon mucosa was otherwise normal.  Retroflexed views revealed no abnormalities. terminal ileum was  normal. The time to cecum=minutes 41 seconds.  Withdrawal time=7 minutes 40 seconds.  The scope was withdrawn and the procedure completed. COMPLICATIONS: There were no complications.  ENDOSCOPIC IMPRESSION: 1.   Abnormal mucosa was found throughout the entire examined colon; multiple biopsies were performed using cold forceps 2.   The colon mucosa was otherwise normal  RECOMMENDATIONS: 1.  Await pathology results 2.  Start Asacol HD 800 Mg tid Rx sent   eSigned:  CGatha Mayer MD, FS[facility]12/04/2013 11:03 AM  cc: LKathyrn Lass MD and The Patient

## 2013-05-29 ENCOUNTER — Telehealth: Payer: Self-pay | Admitting: *Deleted

## 2013-05-29 NOTE — Telephone Encounter (Signed)
  Follow up Call-  Call back number 05/28/2013  Post procedure Call Back phone  # [phone]  Permission to leave phone message Yes     Patient questions:  Do you have a fever, pain , or abdominal swelling? no Pain Score  0 *  Have you tolerated food without any problems? yes  Have you been able to return to your normal activities? yes  Do you have any questions about your discharge instructions: Diet   no Medications  no Follow up visit  no  Do you have questions or concerns about your Care? no  Actions: * If pain score is 4 or above: No action needed, pain <4.

## 2013-06-02 NOTE — Progress Notes (Signed)
Quick Note:  Let him know biopsies consistent with ulcerative colitis. Stay on Asacol See me in 6-8 weeks  LEC no letter - recall colon 8 years ______

## 2013-06-29 ENCOUNTER — Telehealth: Payer: Self-pay | Admitting: Internal Medicine

## 2013-06-29 NOTE — Telephone Encounter (Signed)
Please advise Sir, thank you. 

## 2013-06-30 NOTE — Telephone Encounter (Signed)
Left detailed message for patient to check with his insurance and see if the Lialda or Apriso would be cheaper and to call us back with that information.

## 2013-06-30 NOTE — Telephone Encounter (Signed)
We need to know what his Medicare part D plan is - if he has a list with a formulary we could know better  ? Lialda or apriso on his list

## 2013-07-06 NOTE — Telephone Encounter (Signed)
Left another message trying to reach patient so we may get a less expensive rx sent in for him.

## 2013-07-14 ENCOUNTER — Ambulatory Visit: Payer: Managed Care, Other (non HMO) | Admitting: Internal Medicine

## 2013-07-14 NOTE — Telephone Encounter (Signed)
Spoke to patient, has appointment with Dr. Carlean Purl 07/15/13 and will discuss medicines and $$.  He went ahead and paid for his refill this month, over $300.00.

## 2013-07-15 ENCOUNTER — Encounter: Payer: Self-pay | Admitting: Internal Medicine

## 2013-07-15 ENCOUNTER — Ambulatory Visit (INDEPENDENT_AMBULATORY_CARE_PROVIDER_SITE_OTHER): Payer: Medicare HMO | Admitting: Internal Medicine

## 2013-07-15 VITALS — BP 142/82 | HR 60 | Ht 70.0 in | Wt 170.4 lb

## 2013-07-15 DIAGNOSIS — K5100 Ulcerative (chronic) pancolitis without complications: Secondary | ICD-10-CM

## 2013-07-15 DIAGNOSIS — D649 Anemia, unspecified: Secondary | ICD-10-CM

## 2013-07-15 MED ORDER — SULFASALAZINE 500 MG PO TABS: ORAL_TABLET | ORAL | Status: DC

## 2013-07-15 NOTE — Assessment & Plan Note (Signed)
He reports Hgb was 13 in dec and Dr. Sabra Heck following

## 2013-07-15 NOTE — Patient Instructions (Signed)
Today you have been given a printed rx to check on the price of sulfasalazine.   Also ask your pharmacist to check on the price of Apriso or Lialda.   I appreciate the opportunity to care for you.

## 2013-07-15 NOTE — Progress Notes (Signed)
         Subjective:    Patient ID: Sean Pope, male    DOB: 07-31-1945, 68 y.o.   MRN: [id number]  HPI The patient is here after I diagnosed him with ulcerative pancolitis a colonoscopy in early December. He was started on Asacol HD 800 mg 3 times a day and has noted marked improvement. He now has a slight amount of blood on the tissue paper with nocturnal defecation but is otherwise much better. He tells me his hemoglobin was 13 in late December and is down to one iron a day per Dr. Sabra Heck. He declined a flu vaccine today Medications, allergies, past medical history, past surgical history, family history and social history are reviewed and updated in the EMR.   Review of Systems As above    Objective:   Physical Exam Well-developed well-nourished no acute distress       Assessment & Plan:   1. Ulcerative chronic pancolitis   2. Anemia

## 2013-07-15 NOTE — Assessment & Plan Note (Addendum)
Much improved on Asacol HD 800 mg 3 times a day. This is costly he says at $200 a month and he would like to consider a lower cost medication so we'll try Azulfidine. I explained that he will need periodic CBCs on that medication. Also explained that it has up to a third cancer for rash or sun sensitivity. We'll also see if we can find out about Lialda or Apriso cost.

## 2013-08-24 ENCOUNTER — Telehealth: Payer: Self-pay | Admitting: Internal Medicine

## 2013-08-24 NOTE — Telephone Encounter (Signed)
Patient said that sulfasalazine he has tried for 15 days not working, "feels like the diarrhea coming back".  Said Dr. Carlean Purl said there were other rx's he could try.  Can you advise or do we need to wait and check with Dr. Carlean Purl on Thursday.  Thank you.

## 2013-08-24 NOTE — Telephone Encounter (Signed)
Left message to call me back.

## 2013-08-25 NOTE — Telephone Encounter (Signed)
Left detailed message that per Barb Merino, [facility] it can take awhile for the sulfasalazine to get into his bloodstream and work.  We will ask Dr. Carlean Purl when he returns if he wants to make any changes and be back in touch later this week.

## 2013-08-27 MED ORDER — MESALAMINE 1.2 G PO TBEC: 2.4000 g | DELAYED_RELEASE_TABLET | Freq: Two times a day (BID) | ORAL | Status: DC

## 2013-08-27 NOTE — Telephone Encounter (Signed)
Rx sent in to pharmacy as Dr. Carlean Purl ordered.

## 2013-08-27 NOTE — Telephone Encounter (Signed)
Please advise if any changes need to be made Sir, thank you.

## 2013-08-27 NOTE — Telephone Encounter (Signed)
I spoke to him - we are going to switch to Lialda 1.2 g take 2 tabs bid # 120 5 refills

## 2013-08-27 NOTE — Telephone Encounter (Signed)
Please get a symptom update now

## 2013-08-27 NOTE — Telephone Encounter (Signed)
Says he's no better.  Feels the urge to have a bowel movements, goes and its loose.  Says his "intestines Knot Up".  He is scared to eat so only eating once a day.  Said he appreciates you checking on him.  I told him we would be back in touch.

## 2013-09-04 IMAGING — CR DG CHEST 2V
1 series · 1 of 1 positions shown · non-contrast
Comparison: None.

CLINICAL DATA: Fell hitting head.  No chest complaints.  Slight
cough.  Congestion.

CHEST - 2 VIEW

[w chest lat]
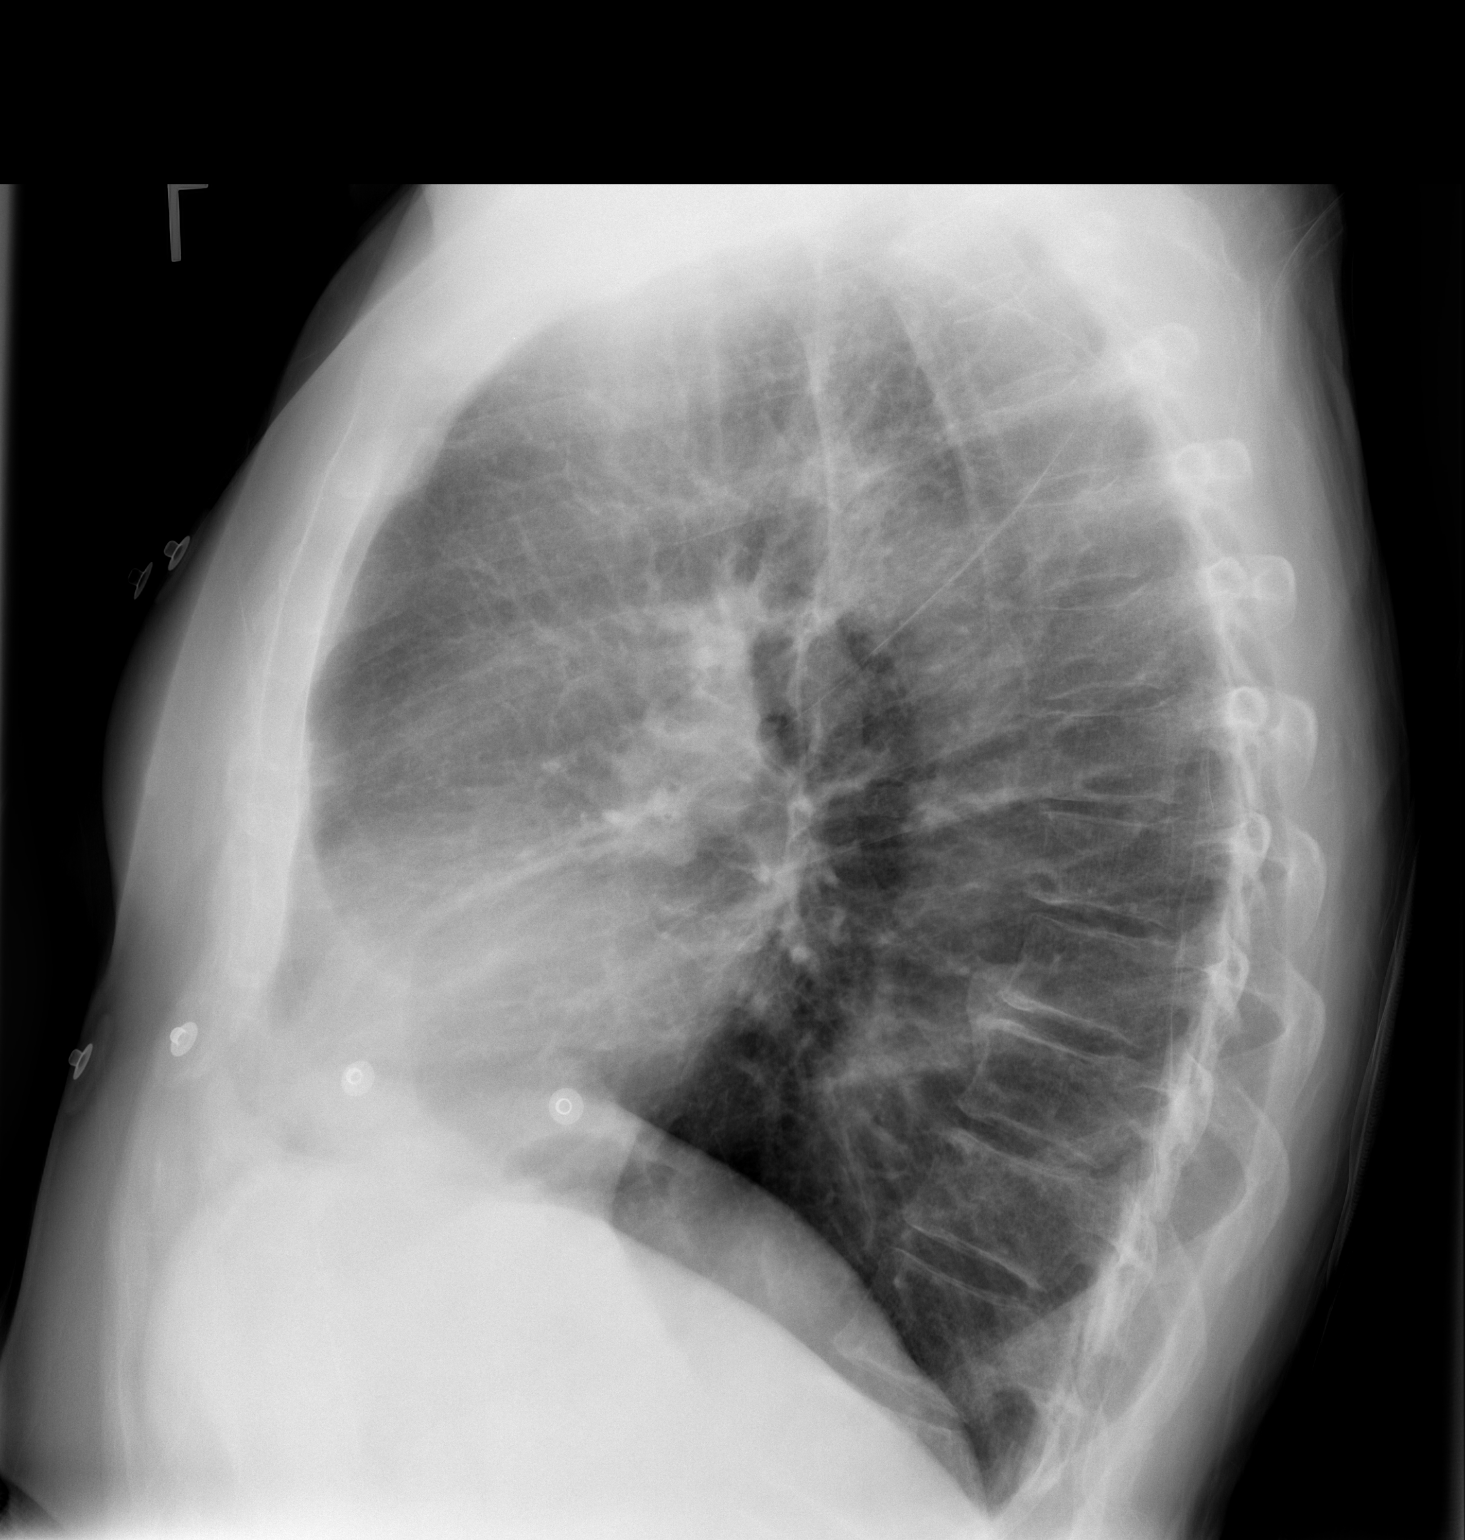

[1 of 1 positions shown; findings below may reference images not displayed]

FINDINGS: No infiltrate, congestive heart failure or pneumothorax..

Cardiac leads overlie the slightly limiting evaluation without
gross abnormality noted.

Mildly tortuous ascending thoracic aorta.

Peribronchial thickening may represent changes of chronic
bronchitis.

Heart size within normal limits.
IMPRESSION: Minimal peribronchial thickening may represent chronic bronchitis
type changes.

Slightly tortuous ascending thoracic aorta.

## 2013-09-05 ENCOUNTER — Emergency Department (HOSPITAL_COMMUNITY): Payer: Medicare HMO

## 2013-09-05 ENCOUNTER — Encounter (HOSPITAL_COMMUNITY): Payer: Self-pay | Admitting: Emergency Medicine

## 2013-09-05 ENCOUNTER — Inpatient Hospital Stay (HOSPITAL_COMMUNITY)
Admission: EM | Admit: 2013-09-05 | Discharge: 2013-09-08 | DRG: 386 | Disposition: A | Discharge status: Home / Self Care | Payer: Medicare HMO | Attending: Internal Medicine | Admitting: Internal Medicine

## 2013-09-05 DIAGNOSIS — IMO0002 Reserved for concepts with insufficient information to code with codable children: Secondary | ICD-10-CM

## 2013-09-05 DIAGNOSIS — D62 Acute posthemorrhagic anemia: Secondary | ICD-10-CM | POA: Diagnosis present

## 2013-09-05 DIAGNOSIS — T380X5A Adverse effect of glucocorticoids and synthetic analogues, initial encounter: Secondary | ICD-10-CM | POA: Diagnosis present

## 2013-09-05 DIAGNOSIS — E860 Dehydration: Secondary | ICD-10-CM | POA: Diagnosis present

## 2013-09-05 DIAGNOSIS — N183 Chronic kidney disease, stage 3 unspecified: Secondary | ICD-10-CM | POA: Diagnosis present

## 2013-09-05 DIAGNOSIS — D72829 Elevated white blood cell count, unspecified: Secondary | ICD-10-CM | POA: Diagnosis present

## 2013-09-05 DIAGNOSIS — E875 Hyperkalemia: Secondary | ICD-10-CM

## 2013-09-05 DIAGNOSIS — D649 Anemia, unspecified: Secondary | ICD-10-CM

## 2013-09-05 DIAGNOSIS — N184 Chronic kidney disease, stage 4 (severe): Secondary | ICD-10-CM | POA: Diagnosis present

## 2013-09-05 DIAGNOSIS — R5381 Other malaise: Secondary | ICD-10-CM | POA: Diagnosis present

## 2013-09-05 DIAGNOSIS — Z87891 Personal history of nicotine dependence: Secondary | ICD-10-CM

## 2013-09-05 DIAGNOSIS — Z8673 Personal history of transient ischemic attack (TIA), and cerebral infarction without residual deficits: Secondary | ICD-10-CM

## 2013-09-05 DIAGNOSIS — I10 Essential (primary) hypertension: Secondary | ICD-10-CM | POA: Diagnosis present

## 2013-09-05 DIAGNOSIS — K8020 Calculus of gallbladder without cholecystitis without obstruction: Secondary | ICD-10-CM | POA: Diagnosis present

## 2013-09-05 DIAGNOSIS — I951 Orthostatic hypotension: Secondary | ICD-10-CM

## 2013-09-05 DIAGNOSIS — Z8249 Family history of ischemic heart disease and other diseases of the circulatory system: Secondary | ICD-10-CM

## 2013-09-05 DIAGNOSIS — N289 Disorder of kidney and ureter, unspecified: Secondary | ICD-10-CM | POA: Diagnosis present

## 2013-09-05 DIAGNOSIS — K5100 Ulcerative (chronic) pancolitis without complications: Secondary | ICD-10-CM

## 2013-09-05 DIAGNOSIS — N179 Acute kidney failure, unspecified: Secondary | ICD-10-CM

## 2013-09-05 DIAGNOSIS — K529 Noninfective gastroenteritis and colitis, unspecified: Secondary | ICD-10-CM

## 2013-09-05 DIAGNOSIS — K5289 Other specified noninfective gastroenteritis and colitis: Secondary | ICD-10-CM

## 2013-09-05 DIAGNOSIS — R5383 Other fatigue: Secondary | ICD-10-CM

## 2013-09-05 DIAGNOSIS — D500 Iron deficiency anemia secondary to blood loss (chronic): Secondary | ICD-10-CM | POA: Diagnosis present

## 2013-09-05 DIAGNOSIS — E785 Hyperlipidemia, unspecified: Secondary | ICD-10-CM | POA: Diagnosis present

## 2013-09-05 DIAGNOSIS — R12 Heartburn: Secondary | ICD-10-CM

## 2013-09-05 DIAGNOSIS — Z79899 Other long term (current) drug therapy: Secondary | ICD-10-CM

## 2013-09-05 LAB — CBC WITH DIFFERENTIAL/PLATELET
Basophils Absolute: 0 K/uL (ref 0.0–0.1)
Basophils Relative: 0 % (ref 0–1)
Eosinophils Absolute: 0 K/uL (ref 0.0–0.7)
Eosinophils Relative: 0 % (ref 0–5)
HCT: 29 % — ABNORMAL LOW (ref 39.0–52.0)
Hemoglobin: 9.3 g/dL — ABNORMAL LOW (ref 13.0–17.0)
Lymphocytes Relative: 14 % (ref 12–46)
Lymphs Abs: 2 K/uL (ref 0.7–4.0)
MCH: 28.5 pg (ref 26.0–34.0)
MCHC: 32.1 g/dL (ref 30.0–36.0)
MCV: 89 fL (ref 78.0–100.0)
Monocytes Absolute: 1.6 K/uL — ABNORMAL HIGH (ref 0.1–1.0)
Monocytes Relative: 11 % (ref 3–12)
Neutro Abs: 11.1 K/uL — ABNORMAL HIGH (ref 1.7–7.7)
Neutrophils Relative %: 75 % (ref 43–77)
Platelets: 439 K/uL — ABNORMAL HIGH (ref 150–400)
RBC: 3.26 MIL/uL — ABNORMAL LOW (ref 4.22–5.81)
RDW: 15.4 % (ref 11.5–15.5)
WBC: 14.7 K/uL — ABNORMAL HIGH (ref 4.0–10.5)

## 2013-09-05 LAB — I-STAT CG4 LACTIC ACID, ED: Lactic Acid, Venous: 2.55 mmol/L — ABNORMAL HIGH (ref 0.5–2.2)

## 2013-09-05 LAB — URINALYSIS, ROUTINE W REFLEX MICROSCOPIC
Bilirubin Urine: NEGATIVE
GLUCOSE, UA: NEGATIVE mg/dL
Hgb urine dipstick: NEGATIVE
Ketones, ur: NEGATIVE mg/dL
LEUKOCYTES UA: NEGATIVE
Nitrite: NEGATIVE
PH: 5 (ref 5.0–8.0)
Protein, ur: NEGATIVE mg/dL
SPECIFIC GRAVITY, URINE: 1.02 (ref 1.005–1.030)
Urobilinogen, UA: 0.2 mg/dL (ref 0.0–1.0)

## 2013-09-05 LAB — COMPREHENSIVE METABOLIC PANEL WITH GFR
ALT: 23 U/L (ref 0–53)
AST: 17 U/L (ref 0–37)
Albumin: 2.4 g/dL — ABNORMAL LOW (ref 3.5–5.2)
Alkaline Phosphatase: 58 U/L (ref 39–117)
BUN: 44 mg/dL — ABNORMAL HIGH (ref 6–23)
CO2: 19 meq/L (ref 19–32)
Calcium: 8.8 mg/dL (ref 8.4–10.5)
Chloride: 99 meq/L (ref 96–112)
Creatinine, Ser: 2.55 mg/dL — ABNORMAL HIGH (ref 0.50–1.35)
GFR calc Af Amer: 28 mL/min — ABNORMAL LOW
GFR calc non Af Amer: 24 mL/min — ABNORMAL LOW
Glucose, Bld: 93 mg/dL (ref 70–99)
Potassium: 5.7 meq/L — ABNORMAL HIGH (ref 3.7–5.3)
Sodium: 135 meq/L — ABNORMAL LOW (ref 137–147)
Total Bilirubin: 0.4 mg/dL (ref 0.3–1.2)
Total Protein: 6 g/dL (ref 6.0–8.3)

## 2013-09-05 LAB — POC OCCULT BLOOD, ED: FECAL OCCULT BLD: NEGATIVE

## 2013-09-05 MED ORDER — HYDROCODONE-ACETAMINOPHEN 5-325 MG PO TABS: 1.0000 | ORAL_TABLET | ORAL | Status: DC | PRN

## 2013-09-05 MED ORDER — SODIUM CHLORIDE 0.9 % IV SOLN
INTRAVENOUS | Status: DC
  Administered 2013-09-05 – 2013-09-07 (×4): via INTRAVENOUS

## 2013-09-05 MED ORDER — CIPROFLOXACIN IN D5W 400 MG/200ML IV SOLN
400.0000 mg | Freq: Two times a day (BID) | INTRAVENOUS | Status: DC
  Administered 2013-09-06 – 2013-09-07 (×3): 400 mg via INTRAVENOUS
  Filled 2013-09-05 (×3): qty 200

## 2013-09-05 MED ORDER — METRONIDAZOLE IN NACL 5-0.79 MG/ML-% IV SOLN: 500.0000 mg | Freq: Once | INTRAVENOUS | Status: DC

## 2013-09-05 MED ORDER — SODIUM CHLORIDE 0.9 % IV BOLUS (SEPSIS)
1000.0000 mL | Freq: Once | INTRAVENOUS | Status: AC
  Administered 2013-09-05: 1000 mL via INTRAVENOUS

## 2013-09-05 MED ORDER — METHYLPREDNISOLONE SODIUM SUCC 40 MG IJ SOLR
40.0000 mg | Freq: Two times a day (BID) | INTRAMUSCULAR | Status: DC
  Administered 2013-09-06 (×2): 40 mg via INTRAVENOUS
  Filled 2013-09-05 (×4): qty 1

## 2013-09-05 MED ORDER — ONDANSETRON HCL 4 MG PO TABS: 4.0000 mg | ORAL_TABLET | Freq: Four times a day (QID) | ORAL | Status: DC | PRN

## 2013-09-05 MED ORDER — FERROUS SULFATE 325 (65 FE) MG PO TABS
325.0000 mg | ORAL_TABLET | Freq: Every day | ORAL | Status: DC
  Administered 2013-09-05 – 2013-09-08 (×4): 325 mg via ORAL
  Filled 2013-09-05 (×4): qty 1

## 2013-09-05 MED ORDER — SIMVASTATIN 40 MG PO TABS
40.0000 mg | ORAL_TABLET | Freq: Every evening | ORAL | Status: DC
  Administered 2013-09-05 – 2013-09-07 (×3): 40 mg via ORAL
  Filled 2013-09-05 (×4): qty 1

## 2013-09-05 MED ORDER — ONDANSETRON HCL 4 MG/2ML IJ SOLN: 4.0000 mg | Freq: Four times a day (QID) | INTRAMUSCULAR | Status: DC | PRN

## 2013-09-05 MED ORDER — MESALAMINE 400 MG PO CPDR
400.0000 mg | DELAYED_RELEASE_CAPSULE | Freq: Three times a day (TID) | ORAL | Status: DC
  Administered 2013-09-05: 400 mg via ORAL
  Filled 2013-09-05 (×4): qty 1

## 2013-09-05 MED ORDER — CIPROFLOXACIN IN D5W 400 MG/200ML IV SOLN
400.0000 mg | Freq: Once | INTRAVENOUS | Status: AC
  Administered 2013-09-05: 400 mg via INTRAVENOUS
  Filled 2013-09-05: qty 200

## 2013-09-05 MED ORDER — IOHEXOL 300 MG/ML  SOLN
50.0000 mL | Freq: Once | INTRAMUSCULAR | Status: AC | PRN
  Administered 2013-09-05: 50 mL via ORAL

## 2013-09-05 MED ORDER — METRONIDAZOLE IN NACL 5-0.79 MG/ML-% IV SOLN
500.0000 mg | Freq: Three times a day (TID) | INTRAVENOUS | Status: DC
  Administered 2013-09-05 – 2013-09-07 (×5): 500 mg via INTRAVENOUS
  Filled 2013-09-05 (×6): qty 100

## 2013-09-05 MED ORDER — METHYLPREDNISOLONE SODIUM SUCC 125 MG IJ SOLR
60.0000 mg | Freq: Two times a day (BID) | INTRAMUSCULAR | Status: DC
  Administered 2013-09-05: 60 mg via INTRAVENOUS
  Filled 2013-09-05: qty 0.96

## 2013-09-05 MED ORDER — ACETAMINOPHEN 650 MG RE SUPP: 650.0000 mg | Freq: Four times a day (QID) | RECTAL | Status: DC | PRN

## 2013-09-05 MED ORDER — ACETAMINOPHEN 325 MG PO TABS: 650.0000 mg | ORAL_TABLET | Freq: Four times a day (QID) | ORAL | Status: DC | PRN

## 2013-09-05 MED ORDER — MORPHINE SULFATE 2 MG/ML IJ SOLN: 2.0000 mg | INTRAMUSCULAR | Status: DC | PRN

## 2013-09-05 MED ORDER — SODIUM CHLORIDE 0.9 % IV SOLN
INTRAVENOUS | Status: DC
  Administered 2013-09-05: 18:00:00 via INTRAVENOUS

## 2013-09-05 NOTE — H&P (Addendum)
[facility]                                                                                    Patient Demographics  Sean Pope, is a 68 y.o. male  CSN: [id number]  MRN: [id number]  DOB - 03-Mar-1946  Admit Date - 09/05/2013  Outpatient Primary MD for the patient is Tawanna Solo, MD   With History of -  Past Medical History  Diagnosis Date  . Stroke   . SAH (subarachnoid hemorrhage)   . Hypertension   . Hyperlipidemia   . Lung collapse 09/26/2011  . Acute respiratory failure with hypoxia 09/17/2011  . 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 Ulcerative colitis 05/28/2013      Past Surgical History  Procedure Laterality Date  . Aneurysm coiling    . Colonoscopy w/ biopsies  05/28/2013    in for   Chief Complaint  Patient presents with  . Diarrhea     HPI  Sean Pope  is a 68 y.o. male, with past medical history significant for hypertension and intracerebral bleed in 2013 was recently diagnosed with ulcerative colitis in December 2014 presenting with worsening diarrhea bloody at times, no nausea or vomiting, mild abdominal cramps, no fever or chills. [facility] . In the emergency room the patient was noted to have orthostatic hypotension and renal failure in addition to hyperkalemia. He was started on IV fluids and I was called to admit    Review of Systems    In addition to the HPI above,  No Fever-chills, No Headache, No changes with Vision or hearing, No problems swallowing food or Liquids, No Chest pain, Cough or Shortness of Breath, No dysuria, No new skin rashes or bruises, No new joints pains-aches,  No new weakness, tingling, numbness in any extremity, No recent weight gain or loss, No polyuria, polydypsia or polyphagia, No significant Mental Stressors.  A full 10 point Review of Systems was done, except as stated above, all other Review of Systems were  negative.   Social History History  Substance Use Topics  . Smoking status: Former Smoker -- 0.50 packs/day for 50 years    Types: Cigarettes  . Smokeless tobacco: Never Used  . Alcohol Use: No     Family History Family History  Problem Relation Age of Onset  . Heart disease Mother      Prior to Admission medications   Medication Sig Start Date End Date Taking? Authorizing Provider  Ferrous Sulfate (IRON) 325 (65 FE) MG TABS Take 1 tablet by mouth daily.   Yes Historical Provider, MD  losartan-hydrochlorothiazide (HYZAAR) 50-12.5 MG per tablet Take 1 tablet by mouth daily.    Yes Historical Provider, MD  mesalamine (ASACOL) 400 MG EC tablet Take 400 mg by mouth 3 (three) times daily.   Yes Historical Provider, MD  simvastatin (ZOCOR) 40 MG tablet Take 40 mg by mouth every evening.   Yes Historical Provider, MD    No Known Allergies  Physical Exam  Vitals  Blood pressure 104/57, pulse 84, temperature 98.6 F (37 C), temperature source Oral, resp. rate 16, SpO2 100.00%.  1. General elderly white gentleman, very pleasant, looks anxious, his children are around him  2. Normal affect and insight, Not Suicidal or Homicidal, Awake Alert, Oriented X 3.  3. No F.N deficits, ALL C.Nerves Intact, Strength 5/5 all 4 extremities, Sensation intact all 4 extremities, Plantars down going.  4. Ears and Eyes appear Normal, Conjunctivae clear, PERRLA. Moist Oral Mucosa.  5. Supple Neck, No JVD, No cervical lymphadenopathy appriciated, No Carotid Bruits.  6. Symmetrical Chest wall movement, Good air movement bilaterally, CTAB.  7. RRR, No Gallops, Rubs or Murmurs, No Parasternal Heave.  8. Positive Bowel Sounds, Abdomen Soft, mild generalized tenderness , No organomegaly appriciated,No rebound -guarding or rigidity.  9.  No Cyanosis, Normal Skin Turgor, No Skin Rash or Bruise.  10. Good muscle tone,  joints appear normal , no effusions, Normal ROM.  11. No Palpable Lymph Nodes  in Neck or Axillae    Data Review  CBC  Recent Labs Lab 09/05/13 1658  WBC 14.7*  HGB 9.3*  HCT 29.0*  PLT 439*  MCV 89.0  MCH 28.5  MCHC 32.1  RDW 15.4  LYMPHSABS 2.0  MONOABS 1.6*  EOSABS 0.0  BASOSABS 0.0   ------------------------------------------------------------------------------------------------------------------  Chemistries   Recent Labs Lab 09/05/13 1658  NA 135*  K 5.7*  CL 99  CO2 19  GLUCOSE 93  BUN 44*  CREATININE 2.55*  CALCIUM 8.8  AST 17  ALT 23  ALKPHOS 58  BILITOT 0.4   ------------------------------------------------------------------------------------------------------------------ CrCl is unknown because both a height and weight (above a minimum accepted value) are required for this calculation. ------------------------------------------------------------------------------------------------------------------ No results found for this basename: TSH, T4TOTAL, FREET3, T3FREE, THYROIDAB,  in the last 72 hours   Coagulation profile No results found for this basename: INR, PROTIME,  in the last 168 hours ------------------------------------------------------------------------------------------------------------------- No results found for this basename: DDIMER,  in the last 72 hours -------------------------------------------------------------------------------------------------------------------  Cardiac Enzymes No results found for this basename: CK, CKMB, TROPONINI, MYOGLOBIN,  in the last 168 hours ------------------------------------------------------------------------------------------------------------------ No components found with this basename: POCBNP,    ---------------------------------------------------------------------------------------------------------------  Urinalysis No results found for this basename: colorurine, appearanceur, labspec, phurine, glucoseu, hgbur, bilirubinur, ketonesur, proteinur, urobilinogen,  nitrite, leukocytesur    ----------------------------------------------------------------------------------------------------------------     Imaging results:   Ct Abdomen Pelvis Wo Contrast  09/05/2013   CLINICAL DATA:  Diarrhea abdominal pain and weakness. Ulcerative colitis.  EXAM: CT ABDOMEN AND PELVIS WITHOUT CONTRAST  TECHNIQUE: Multidetector CT imaging of the abdomen and pelvis was performed following the standard protocol without intravenous contrast.  COMPARISON:  03/11/2013  FINDINGS: Mild diffuse colitis is again seen, and shows no significant change compared to prior study. No evidence of involvement of the terminal ileum or bowel obstruction. There is no evidence of abscess, fistula, or free air.  Noncontrast images of the liver, spot pancreas, spleen, and adrenal glands are unremarkable. Cholelithiasis is again seen, without evidence of cholecystitis or biliary dilatation. Bilateral fluid attenuation renal cysts are stable and there is no evidence of renal calculi or hydronephrosis. No soft tissue masses or lymphadenopathy identified.  IMPRESSION: Mild diffuse colitis, without significant change compared to previous study. This is consistent with patient's history of ulcerative colitis.  No evidence of abscess, bowel obstruction, or other complication.  Cholelithiasis. No radiographic evidence of cholecystitis.   Electronically Signed   By: Earle Gell M.D.   On: 09/05/2013 19:09       Assessment & Plan  1. ulcerative colitis flareup 2. Dehydration with acute kidney injury and  hypotension 3. History of hypertension 4. History of intracerebral hemorrhage 5. Hyperkalemia, EKG still pending  Plan IV fluids IV Cipro and Flagyl IV steroids Consult GI in a.m. Dr. Tresea Mall  hold his ARB   check EKG  re check potassium level at 11:30 PM    DVT Prophylaxis SCDs  AM Labs Ordered, also please review Full Orders  Family Communication: Admission, patients condition and  plan of care including tests being ordered have been discussed with the patient and children who indicate understanding and agree with the plan and Code Status.  Code Status full  Disposition Plan: Home  Time spent in minutes : 33 minutes  Condition GUARDED   @SIGNATURE @

## 2013-09-05 NOTE — ED Notes (Signed)
Patient is unable to provide a urine sample at this time. Urinal was left at bedside.

## 2013-09-05 NOTE — ED Notes (Signed)
Pt in CT.

## 2013-09-05 NOTE — ED Notes (Signed)
Pt aware of the need for a urine sample. Urinal at bedside.

## 2013-09-05 NOTE — ED Notes (Signed)
Pt having multiple diarrheal episodes after CT oral contrast

## 2013-09-05 NOTE — ED Provider Notes (Signed)
Medical screening examination/treatment/procedure(s) were conducted as a shared visit with non-physician practitioner(s) and myself.  I personally evaluated the patient during the encounter.   EKG Interpretation None      I interviewed and examined the patient. Lungs are CTAB. Cardiac exam wnl. Abdomen soft.  Will get CT and plan on admission.   Blanchard Kelch, MD 09/05/13 2342

## 2013-09-05 NOTE — ED Notes (Signed)
Hospitalist at bedside 

## 2013-09-05 NOTE — ED Notes (Signed)
Gertie Fey PA made aware of patient CG4 Lactic results.

## 2013-09-05 NOTE — ED Provider Notes (Signed)
CSN: [id number]     Arrival date & time 09/05/13  1529 History   First MD Initiated Contact with Patient 09/05/13 1541     Chief Complaint  Patient presents with  . Diarrhea     (Consider location/radiation/quality/duration/timing/severity/associated sxs/prior Treatment) HPI  68 year old male with history of stroke, hypertension, hyperlipidemia, ulcerated colitis who presents complaining of diarrhea and weakness. Patient states since he was diagnosed with ulcerated pancolitis in December from a colonoscopy, he has had recurrent diarrhea. States after he eats he would experienced liquid stool.  States his doctor is aware and he has been switching from one medication to that makes trying to find the best and most affordable medication for him. He additionally takes Asacol, then switched to sulfasalazine, then to Lialda, and now back to Asacol which he started again 2 days ago.  Since yesterday he he has developed 6-7 bouts of diarrhea. Reports trace of bright red blood per rectum. Endorse mild 4/10 abdominal cramping and mild rectal pain. He reports being weak, lightheadedness, and dehydrated. Endorse chills. Denies fever, headache, chest pain shortness of breath, productive cough, back pain, dysuria, melena, or rash. Denies any recent antibiotic use, or any recent travel or eating exotic food.    Past Medical History  Diagnosis Date  . Stroke   . SAH (subarachnoid hemorrhage)   . Hypertension   . Hyperlipidemia   . Lung collapse 09/26/2011  . Acute respiratory failure with hypoxia 09/17/2011  . 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 Ulcerative colitis 05/28/2013   Past Surgical History  Procedure Laterality Date  . Aneurysm coiling    . Colonoscopy w/ biopsies  05/28/2013   Family History  Problem Relation Age of Onset  . Heart disease Mother    History  Substance Use Topics  . Smoking status: Former Smoker -- 0.50 packs/day for 50 years    Types: Cigarettes  .  Smokeless tobacco: Never Used  . Alcohol Use: No    Review of Systems  All other systems reviewed and are negative.      Allergies  Review of patient's allergies indicates no known allergies.  Home Medications   Current Outpatient Rx  Name  Route  Sig  Dispense  Refill  . Ferrous Sulfate (IRON) 325 (65 FE) MG TABS   Oral   Take 1 tablet by mouth daily.         Marland Kitchen losartan-hydrochlorothiazide (HYZAAR) 50-12.5 MG per tablet   Oral   Take 0.5 tablets by mouth daily.          . mesalamine (LIALDA) 1.2 G EC tablet   Oral   Take 2 tablets (2.4 g total) by mouth 2 (two) times daily.   120 tablet   5   . simvastatin (ZOCOR) 40 MG tablet   Oral   Take 40 mg by mouth every evening.          BP 105/53  Pulse 91  Temp(Src) 98.3 F (36.8 C)  Resp 20  SpO2 100% Physical Exam  Nursing note and vitals reviewed. Constitutional: He appears well-developed and well-nourished. No distress.  Awake, alert, nontoxic appearance  HENT:  Head: Atraumatic.  Mouth is dry  Eyes: Conjunctivae are normal. Right eye exhibits no discharge. Left eye exhibits no discharge.  Neck: Normal range of motion. Neck supple.  Cardiovascular: Normal rate and regular rhythm.   Pulmonary/Chest: Effort normal. No respiratory distress. He exhibits no tenderness.  Abdominal: Soft. There is tenderness (left lower quadrant tenderness without guarding or  rebound tenderness). There is no rebound.  Musculoskeletal: He exhibits no tenderness.  ROM appears intact, no obvious focal weakness  Neurological: He is alert.  Skin: Skin is warm and dry. No rash noted.  Psychiatric: He has a normal mood and affect.    ED Course  Procedures (including critical care time)  4:00 PM Patient with history of ulcerative colitis, here with persistent diarrhea. He appears dehydrated. Minimal abdominal pain, low suspicion for perforation. Evidence of mild hypotension. No recent antibiotic use however we'll obtain C.  difficile, work up initiated.  4:16 PM Pt has positive orthostasis.  Will continue IV hydration and will admit for further management.    5:30 PM An elevated lactic acid of 2.55, we'll continue hydration. Elevated WBC of 14.7, hemoglobin is 9.3 with a negative fecal Hemoccult. Elevated potassium of 5.7, we'll obtain EKG. Evidence of acute renal injury with BUN 44, creatinine 2.55 which is markedly elevated from his baseline. His GFR is 28. He is making urine.  6:24 PM Patient is responding to IV fluid. He is hemodynamically stable. Currently await CT scan.  Care discussed with Dr. Aline Brochure  8:02 PM EKG without T wave changes.  Abd/pelvis CT with mild diffuse colitis consistent with pt's hx.  NO evidence of abscess, bowel obstruction, or perforation.  I have consulted [facility], Dr. Laren Everts, who recquest pt to be started on cipro/flagyl, continue IVF, and admit to med surg, team 8, under his care.  Pt is aware of plan.    Labs Review Labs Reviewed  CBC WITH DIFFERENTIAL - Abnormal; Notable for the following:    WBC 14.7 (*)    RBC 3.26 (*)    Hemoglobin 9.3 (*)    HCT 29.0 (*)    Platelets 439 (*)    Neutro Abs 11.1 (*)    Monocytes Absolute 1.6 (*)    All other components within normal limits  COMPREHENSIVE METABOLIC PANEL - Abnormal; Notable for the following:    Sodium 135 (*)    Potassium 5.7 (*)    BUN 44 (*)    Creatinine, Ser 2.55 (*)    Albumin 2.4 (*)    GFR calc non Af Amer 24 (*)    GFR calc Af Amer 28 (*)    All other components within normal limits  I-STAT CG4 LACTIC ACID, ED - Abnormal; Notable for the following:    Lactic Acid, Venous 2.55 (*)    All other components within normal limits  CLOSTRIDIUM DIFFICILE BY PCR  URINALYSIS, ROUTINE W REFLEX MICROSCOPIC  POC OCCULT BLOOD, ED  POC OCCULT BLOOD, ED   Imaging Review Ct Abdomen Pelvis Wo Contrast  09/05/2013   CLINICAL DATA:  Diarrhea abdominal pain and weakness. Ulcerative colitis.  EXAM: CT ABDOMEN  AND PELVIS WITHOUT CONTRAST  TECHNIQUE: Multidetector CT imaging of the abdomen and pelvis was performed following the standard protocol without intravenous contrast.  COMPARISON:  03/11/2013  FINDINGS: Mild diffuse colitis is again seen, and shows no significant change compared to prior study. No evidence of involvement of the terminal ileum or bowel obstruction. There is no evidence of abscess, fistula, or free air.  Noncontrast images of the liver, spot pancreas, spleen, and adrenal glands are unremarkable. Cholelithiasis is again seen, without evidence of cholecystitis or biliary dilatation. Bilateral fluid attenuation renal cysts are stable and there is no evidence of renal calculi or hydronephrosis. No soft tissue masses or lymphadenopathy identified.  IMPRESSION: Mild diffuse colitis, without significant change compared to previous study. This  is consistent with patient's history of ulcerative colitis.  No evidence of abscess, bowel obstruction, or other complication.  Cholelithiasis. No radiographic evidence of cholecystitis.   Electronically Signed   By: Earle Gell M.D.   On: 09/05/2013 19:09     EKG Interpretation None      Date: 09/05/2013  Rate: 90  Rhythm: normal sinus rhythm  QRS Axis: normal  Intervals: normal  ST/T Wave abnormalities: nonspecific ST/T changes  Conduction Disutrbances:ventricular bigemini  Narrative Interpretation:   Old EKG Reviewed: none available    MDM   Final diagnoses:  Ulcerative chronic pancolitis  AKI (acute kidney injury)  Hyperkalemia  Orthostatic hypotension    BP 104/57  Pulse 84  Temp(Src) 98.6 F (37 C) (Oral)  Resp 16  SpO2 100%  I have reviewed nursing notes and vital signs. I personally reviewed the imaging tests through PACS system  I reviewed available ER/hospitalization records thought the EMR     Domenic Moras, PA-C 09/05/13 2005

## 2013-09-05 NOTE — ED Notes (Signed)
Pt from home-walked through EMS doors c/o diarrhea and weakness. Pt reports that he was dx with colitis 3 months ago. Pt reports that he has had diarrhea since and whenever he eats, he has immediately has liquid stool. Pt reports 4/10 pain. Pt also adds that he is unable to sleep. Pt is A&O and in NAD. Pt denies N/V/fever.

## 2013-09-06 DIAGNOSIS — I951 Orthostatic hypotension: Secondary | ICD-10-CM

## 2013-09-06 DIAGNOSIS — D649 Anemia, unspecified: Secondary | ICD-10-CM

## 2013-09-06 DIAGNOSIS — E875 Hyperkalemia: Secondary | ICD-10-CM

## 2013-09-06 DIAGNOSIS — K5100 Ulcerative (chronic) pancolitis without complications: Principal | ICD-10-CM

## 2013-09-06 LAB — IRON AND TIBC
IRON: 16 ug/dL — AB (ref 42–135)
Saturation Ratios: 10 % — ABNORMAL LOW (ref 20–55)
TIBC: 155 ug/dL — AB (ref 215–435)
UIBC: 139 ug/dL (ref 125–400)

## 2013-09-06 LAB — BASIC METABOLIC PANEL
BUN: 39 mg/dL — ABNORMAL HIGH (ref 6–23)
CHLORIDE: 98 meq/L (ref 96–112)
CO2: 20 mEq/L (ref 19–32)
Calcium: 7.8 mg/dL — ABNORMAL LOW (ref 8.4–10.5)
Creatinine, Ser: 2.1 mg/dL — ABNORMAL HIGH (ref 0.50–1.35)
GFR calc Af Amer: 36 mL/min — ABNORMAL LOW (ref >90)
GFR calc non Af Amer: 31 mL/min — ABNORMAL LOW (ref >90)
Glucose, Bld: 123 mg/dL — ABNORMAL HIGH (ref 70–99)
Potassium: 5 mEq/L (ref 3.7–5.3)
Sodium: 130 mEq/L — ABNORMAL LOW (ref 137–147)

## 2013-09-06 LAB — CBC
HEMATOCRIT: 25.4 % — AB (ref 39.0–52.0)
Hemoglobin: 8.5 g/dL — ABNORMAL LOW (ref 13.0–17.0)
MCH: 29.2 pg (ref 26.0–34.0)
MCHC: 33.5 g/dL (ref 30.0–36.0)
MCV: 87.3 fL (ref 78.0–100.0)
Platelets: 496 10*3/uL — ABNORMAL HIGH (ref 150–400)
RBC: 2.91 MIL/uL — ABNORMAL LOW (ref 4.22–5.81)
RDW: 15.4 % (ref 11.5–15.5)
WBC: 8.8 10*3/uL (ref 4.0–10.5)

## 2013-09-06 LAB — CLOSTRIDIUM DIFFICILE BY PCR
CDIFFPCR: NEGATIVE
Toxigenic C. Difficile by PCR: NEGATIVE

## 2013-09-06 LAB — POTASSIUM: Potassium: 4.9 mEq/L (ref 3.7–5.3)

## 2013-09-06 LAB — FERRITIN: FERRITIN: 174 ng/mL (ref 22–322)

## 2013-09-06 MED ORDER — MESALAMINE 400 MG PO CPDR
800.0000 mg | DELAYED_RELEASE_CAPSULE | Freq: Three times a day (TID) | ORAL | Status: DC
  Administered 2013-09-06 – 2013-09-08 (×7): 800 mg via ORAL
  Filled 2013-09-06 (×9): qty 2

## 2013-09-06 NOTE — Consult Note (Signed)
Referring Provider: No ref. provider found Primary Care Physician:  Tawanna Solo, MD Primary Gastroenterologist:  Dr. Carlean Purl  Reason for Consultation:  UC with worsening bloody diarrhea  HPI: Sean Pope is a 68 y.o. male with past medical history significant for hypertension and intracerebral bleed in 2013 who was recently diagnosed with ulcerative colitis in December 2014.  He presented to [facility] ED yesterday with worsening diarrhea, bloody at times and mild abdominal cramps.  He denies nausea, vomiting, fevers, and chills.  CT scan was performed and showed the following:  IMPRESSION:  Mild diffuse colitis, without significant change compared to  previous study. This is consistent with patient's history of  ulcerative colitis.  No evidence of abscess, bowel obstruction, or other complication.  Cholelithiasis. No radiographic evidence of cholecystitis.  In the emergency room the patient was also noted to have orthostatic hypotension and acute renal failure in addition to hyperkalemia.  He also had a leukocytosis at 14.7.  Hgb was 9.3 grams, which is down from 13.1 grams 1.5 years ago.  Patient was admitted for further management and GI was consulted.  Colonoscopy 05/2013 showed abnormal mucosa throughout the entire colon.  Biopsies were obtained and were c/w UC.  He was started on Asacol HD 800 mg TID.  At his first post-colonoscopy follow-up visit on 07/16/2013 he had noted marked improvement in his symptoms on the Asacol as per Dr. Celesta Aver note, but the medication was costly.  They tried Azulfidine, but patient's symptoms seem to get worse again so that was then changed to Lialda 2.4 grams daily, which patient was taking up to the time of admission, but caused his symptoms to be even worse.  Today, patient's Hgb is down again to 8.5 grams.  Leukocytosis has resolved.  Hyperkalemia is improved and renal failure improving some as well with IVF's.  Cdiff is pending.  He was FOBT negative  here.  He is on cipro 400 mg BID and flagyl 500 mg TID as well as solumedrol 40 mg daily.  Says that diarrhea has already improved significantly since the IV steroids were started.  Had not tried any clear liquids at this point, but plans to later today (says that he has been afraid to eat).  Had lost approximately 15 pounds over the past couple of months since symptoms worsening.   Past Medical History  Diagnosis Date  . Stroke   . SAH (subarachnoid hemorrhage)   . Hypertension   . Hyperlipidemia   . Lung collapse 09/26/2011  . Acute respiratory failure with hypoxia 09/17/2011  . 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 Ulcerative colitis 05/28/2013    Past Surgical History  Procedure Laterality Date  . Aneurysm coiling    . Colonoscopy w/ biopsies  05/28/2013    Prior to Admission medications   Medication Sig Start Date End Date Taking? Authorizing Provider  Ferrous Sulfate (IRON) 325 (65 FE) MG TABS Take 1 tablet by mouth daily.   Yes Historical Provider, MD  losartan-hydrochlorothiazide (HYZAAR) 50-12.5 MG per tablet Take 1 tablet by mouth daily.    Yes Historical Provider, MD  mesalamine (ASACOL) 400 MG EC tablet Take 400 mg by mouth 3 (three) times daily.   Yes Historical Provider, MD  simvastatin (ZOCOR) 40 MG tablet Take 40 mg by mouth every evening.   Yes Historical Provider, MD    Current Facility-Administered Medications  Medication Dose Route Frequency Provider Last Rate Last Dose  . 0.9 %  sodium chloride infusion   Intravenous Continuous Gertie Fey  Rona Ravens, PA-C      . 0.9 %  sodium chloride infusion   Intravenous Continuous Merton Border, MD 100 mL/hr at 09/05/13 2150    . acetaminophen (TYLENOL) tablet 650 mg  650 mg Oral Q6H PRN Merton Border, MD       Or  . acetaminophen (TYLENOL) suppository 650 mg  650 mg Rectal Q6H PRN Merton Border, MD      . ciprofloxacin (CIPRO) IVPB 400 mg  400 mg Intravenous Q12H Merton Border, MD      . ferrous sulfate tablet 325 mg  325 mg Oral  Daily Merton Border, MD   325 mg at 09/05/13 2150  . HYDROcodone-acetaminophen (NORCO/VICODIN) 5-325 MG per tablet 1-2 tablet  1-2 tablet Oral Q4H PRN Merton Border, MD      . Mesalamine (ASACOL) DR capsule 400 mg  400 mg Oral TID Merton Border, MD   400 mg at 09/05/13 2150  . methylPREDNISolone sodium succinate (SOLU-MEDROL) 40 mg/mL injection 40 mg  40 mg Intravenous Q12H Merton Border, MD      . metroNIDAZOLE (FLAGYL) IVPB 500 mg  500 mg Intravenous 3 times per day Merton Border, MD   500 mg at 09/06/13 0609  . morphine 2 MG/ML injection 2 mg  2 mg Intravenous Q4H PRN Merton Border, MD      . ondansetron [facility]) tablet 4 mg  4 mg Oral Q6H PRN Merton Border, MD       Or  . ondansetron (ZOFRAN) injection 4 mg  4 mg Intravenous Q6H PRN Merton Border, MD      . simvastatin (ZOCOR) tablet 40 mg  40 mg Oral QPM Merton Border, MD   40 mg at 09/05/13 2150    Allergies as of 09/05/2013  . (No Known Allergies)    Family History  Problem Relation Age of Onset  . Heart disease Mother     History   Social History  . Marital Status: Married    Spouse Name: N/A    Number of Children: 2  . Years of Education: N/A   Occupational History  . Director    Social History Main Topics  . Smoking status: Former Smoker -- 0.50 packs/day for 50 years    Types: Cigarettes  . Smokeless tobacco: Never Used  . Alcohol Use: No  . Drug Use: No  . Sexual Activity: Not on file   Other Topics Concern  . Not on file   Social History Narrative   Married, 1 son and 1 daughter   TEFL teacher   1.5 caffeine drinks daily    Review of Systems: Ten point ROS is O/W negative except as mentioned in HPI.  Physical Exam: Vital signs in last 24 hours: Temp:  [97.9 F (36.6 C)-98.7 F (37.1 C)] 97.9 F (36.6 C) (03/22 0450) Pulse Rate:  [75-123] 75 (03/22 0450) Resp:  [14-20] 16 (03/22 0450) BP: (87-117)/(53-67) 96/58 mmHg (03/22 0450) SpO2:  [98 %-100 %] 98 % (03/22 0450) Weight:  [162 lb 14.7  oz (73.9 kg)] 162 lb 14.7 oz (73.9 kg) (03/21 2101) Last BM Date: 09/05/13 General:  Alert, Well-developed, well-nourished, pleasant and cooperative in NAD Head:  Normocephalic and atraumatic. Eyes:  Sclera clear, no icterus.  Conjunctiva pink. Ears:  Normal auditory acuity. Mouth:  No deformity or lesions.   Lungs:  Clear throughout to auscultation.  No wheezes, crackles, or rhonchi.  Heart:  Regular rate and rhythm; no murmurs, clicks, rubs, or gallops. Abdomen:  Soft, non-distended.  BS present.  Non-tender.   Rectal:  Deferred  Msk:  Symmetrical without gross deformities. Pulses:  Normal pulses noted. Extremities:  Without clubbing or edema. Neurologic:  Alert and  oriented x4;  grossly normal neurologically. Skin:  Intact without significant lesions or rashes. Psych:  Alert and cooperative. Normal mood and affect.  Intake/Output from previous day: 03/21 0701 - 03/22 0700 In: 60 [P.O.:60] Out: 300 [Urine:300]  Lab Results:  Recent Labs  09/05/13 1658 09/06/13 0415  WBC 14.7* 8.8  HGB 9.3* 8.5*  HCT 29.0* 25.4*  PLT 439* 496*   BMET  Recent Labs  09/05/13 1658 09/05/13 2335 09/06/13 0415  NA 135*  --  130*  K 5.7* 4.9 5.0  CL 99  --  98  CO2 19  --  20  GLUCOSE 93  --  123*  BUN 44*  --  39*  CREATININE 2.55*  --  2.10*  CALCIUM 8.8  --  7.8*   LFT  Recent Labs  09/05/13 1658  PROT 6.0  ALBUMIN 2.4*  AST 17  ALT 23  ALKPHOS 58  BILITOT 0.4   Studies/Results: Ct Abdomen Pelvis Wo Contrast  09/05/2013   CLINICAL DATA:  Diarrhea abdominal pain and weakness. Ulcerative colitis.  EXAM: CT ABDOMEN AND PELVIS WITHOUT CONTRAST  TECHNIQUE: Multidetector CT imaging of the abdomen and pelvis was performed following the standard protocol without intravenous contrast.  COMPARISON:  03/11/2013  FINDINGS: Mild diffuse colitis is again seen, and shows no significant change compared to prior study. No evidence of involvement of the terminal ileum or bowel  obstruction. There is no evidence of abscess, fistula, or free air.  Noncontrast images of the liver, spot pancreas, spleen, and adrenal glands are unremarkable. Cholelithiasis is again seen, without evidence of cholecystitis or biliary dilatation. Bilateral fluid attenuation renal cysts are stable and there is no evidence of renal calculi or hydronephrosis. No soft tissue masses or lymphadenopathy identified.  IMPRESSION: Mild diffuse colitis, without significant change compared to previous study. This is consistent with patient's history of ulcerative colitis.  No evidence of abscess, bowel obstruction, or other complication.  Cholelithiasis. No radiographic evidence of cholecystitis.   Electronically Signed   By: Earle Gell M.D.   On: 09/05/2013 19:09    IMPRESSION:  -Universal UC as seen and diagnosed on colonoscopy in 05/2013:  Initially had great response to Asacol, but it was costly.  Had worsening of symptoms on Azulfidine and most recently Lialda.  Now much improved already with initiation of IV steroids. -ABLA:  Secondary to above.  Hgb down again slightly this AM.   -AKI:  Slightly improved this AM after some IV resuscitation.      PLAN: -Continue IV steroids at 40 mg daily.  Will need to be discharged on steroid taper. -Follow-up C diff results. -Likely will not need to be discharge on antibiotics unless Cdiff is positive. -Monitor Hgb and transfuse if needed.  Monitor renal function. -Will increase Asacol to home dose of 800 mg TID. -Await iron studies.  Currently on ferrous sulfate 4325 mg daily.   ZEHR, JESSICA D.  09/06/2013, 8:42 AM  Pager number [phone]      Attending physician's note   I have taken a history, examined the patient and reviewed the chart. I agree with the Advanced Practitioner's note, impression and recommendations. Universal UC with marked worsening of diarrhea over past several days and worsening anemia without significant GI bleeding noted. He noted  scant blood with diarrhea at  home but stool is heme negative here. R/O UC flare with possible infection. Continue solumedrol IV 40 mg daily. Asacol 800 mg tid. Continue IV antibiotics for now.  Await C diff, fe studies. Clears for now.  Ladene Artist, MD Marval Regal

## 2013-09-06 NOTE — Progress Notes (Signed)
On-Call notified of K level, as per order.

## 2013-09-06 NOTE — Progress Notes (Addendum)
[facility] PROGRESS NOTE  Sean Pope GGY:[id number] DOB: 08-30-1945 DOA: 09/05/2013 PCP: Tawanna Solo, MD  Assessment/Plan: IBD/ulcerative colitis  HTN  68 y.o. male, with past medical history significant for hypertension and intracerebral bleed in 2013 was recently diagnosed with ulcerative colitis in December 2014 presenting with worsening diarrhea bloody at times, no nausea or vomiting, mild abdominal cramps  1. Ulcerative colitis exacerbation+bleeding; CT abd: Mild diffuse colitis -started on IV steroids, atx,cont mesalamine; check c diff; consulted GI  2. HTN, hold BP meds due to labile pressure   3. Acute blood loss anemia due to IBD/bleeding -monitor Hg, TF PRN; check iron profile; c/sGI   4. AKI likely prerenal+meds ARB/hctz -cont IVF, hold meds; recheck renal panel in AM   Code Status: full Family Communication: d/w patient, called Peed,Sheryl Spouse [phone] no answer; will try later  (indicate person spoken with, relationship, and if by phone, the number) Disposition Plan: home when clinically better    Consultants:  GI  Procedures:  none  Antibiotics:  cipro 3/21<<<<<  Flagyl 3/21<<<<   (indicate start date, and stop date if known)  HPI/Subjective: alert  Objective: Filed Vitals:   09/06/13 0450  BP: 96/58  Pulse: 75  Temp: 97.9 F (36.6 C)  Resp: 16    Intake/Output Summary (Last 24 hours) at 09/06/13 0843 Last data filed at 09/06/13 0500  Gross per 24 hour  Intake     60 ml  Output    300 ml  Net   -240 ml   Filed Weights   09/05/13 2101  Weight: 73.9 kg (162 lb 14.7 oz)    Exam:   General:  alert  Cardiovascular: s1,s2 rrr  Respiratory: CTA BL  Abdomen: soft, nt,nd   Musculoskeletal: no LE edema   Data Reviewed: Basic Metabolic Panel:  Recent Labs Lab 09/05/13 1658 09/05/13 2335 09/06/13 0415  NA 135*  --  130*  K 5.7* 4.9 5.0  CL 99  --  98  CO2 19  --  20  GLUCOSE 93  --  123*  BUN 44*   --  39*  CREATININE 2.55*  --  2.10*  CALCIUM 8.8  --  7.8*   Liver Function Tests:  Recent Labs Lab 09/05/13 1658  AST 17  ALT 23  ALKPHOS 58  BILITOT 0.4  PROT 6.0  ALBUMIN 2.4*   No results found for this basename: LIPASE, AMYLASE,  in the last 168 hours No results found for this basename: AMMONIA,  in the last 168 hours CBC:  Recent Labs Lab 09/05/13 1658 09/06/13 0415  WBC 14.7* 8.8  NEUTROABS 11.1*  --   HGB 9.3* 8.5*  HCT 29.0* 25.4*  MCV 89.0 87.3  PLT 439* 496*   Cardiac Enzymes: No results found for this basename: CKTOTAL, CKMB, CKMBINDEX, TROPONINI,  in the last 168 hours BNP (last 3 results) No results found for this basename: PROBNP,  in the last 8760 hours CBG: No results found for this basename: GLUCAP,  in the last 168 hours  No results found for this or any previous visit (from the past 240 hour(s)).   Studies: Ct Abdomen Pelvis Wo Contrast  09/05/2013   CLINICAL DATA:  Diarrhea abdominal pain and weakness. Ulcerative colitis.  EXAM: CT ABDOMEN AND PELVIS WITHOUT CONTRAST  TECHNIQUE: Multidetector CT imaging of the abdomen and pelvis was performed following the standard protocol without intravenous contrast.  COMPARISON:  03/11/2013  FINDINGS: Mild diffuse colitis is again seen, and shows no significant change compared  to prior study. No evidence of involvement of the terminal ileum or bowel obstruction. There is no evidence of abscess, fistula, or free air.  Noncontrast images of the liver, spot pancreas, spleen, and adrenal glands are unremarkable. Cholelithiasis is again seen, without evidence of cholecystitis or biliary dilatation. Bilateral fluid attenuation renal cysts are stable and there is no evidence of renal calculi or hydronephrosis. No soft tissue masses or lymphadenopathy identified.  IMPRESSION: Mild diffuse colitis, without significant change compared to previous study. This is consistent with patient's history of ulcerative colitis.  No  evidence of abscess, bowel obstruction, or other complication.  Cholelithiasis. No radiographic evidence of cholecystitis.   Electronically Signed   By: Earle Gell M.D.   On: 09/05/2013 19:09    Scheduled Meds: . ciprofloxacin  400 mg Intravenous Q12H  . ferrous sulfate  325 mg Oral Daily  . Mesalamine  400 mg Oral TID  . methylPREDNISolone (SOLU-MEDROL) injection  40 mg Intravenous Q12H  . metronidazole  500 mg Intravenous 3 times per day  . simvastatin  40 mg Oral QPM   Continuous Infusions: . sodium chloride Stopped (09/05/13 1951)  . sodium chloride 100 mL/hr at 09/05/13 2150    Active Problems:   Colitis    Time spent: >35 minutes     Rowe Clack N  [facility] Pager [phone]. If 7PM-7AM, please contact night-coverage at [email or web address], password [facility] 09/06/2013, 8:43 AM  LOS: 1 day

## 2013-09-07 LAB — BASIC METABOLIC PANEL
BUN: 41 mg/dL — AB (ref 6–23)
CALCIUM: 7.8 mg/dL — AB (ref 8.4–10.5)
CO2: 19 meq/L (ref 19–32)
Chloride: 103 mEq/L (ref 96–112)
Creatinine, Ser: 1.87 mg/dL — ABNORMAL HIGH (ref 0.50–1.35)
GFR calc Af Amer: 41 mL/min — ABNORMAL LOW (ref >90)
GFR calc non Af Amer: 35 mL/min — ABNORMAL LOW (ref >90)
GLUCOSE: 144 mg/dL — AB (ref 70–99)
Potassium: 5.2 mEq/L (ref 3.7–5.3)
SODIUM: 135 meq/L — AB (ref 137–147)

## 2013-09-07 LAB — CBC
HEMATOCRIT: 24.9 % — AB (ref 39.0–52.0)
HEMOGLOBIN: 8.1 g/dL — AB (ref 13.0–17.0)
MCH: 28.6 pg (ref 26.0–34.0)
MCHC: 32.5 g/dL (ref 30.0–36.0)
MCV: 88 fL (ref 78.0–100.0)
Platelets: 507 10*3/uL — ABNORMAL HIGH (ref 150–400)
RBC: 2.83 MIL/uL — AB (ref 4.22–5.81)
RDW: 15.4 % (ref 11.5–15.5)
WBC: 15.5 10*3/uL — ABNORMAL HIGH (ref 4.0–10.5)

## 2013-09-07 MED ORDER — METHYLPREDNISOLONE SODIUM SUCC 40 MG IJ SOLR: 40.0000 mg | INTRAMUSCULAR | Status: DC

## 2013-09-07 MED ORDER — METHYLPREDNISOLONE SODIUM SUCC 40 MG IJ SOLR
40.0000 mg | INTRAMUSCULAR | Status: DC
  Administered 2013-09-07 – 2013-09-08 (×2): 40 mg via INTRAVENOUS
  Filled 2013-09-07: qty 1

## 2013-09-07 MED ORDER — CALCIUM CARBONATE ANTACID 500 MG PO CHEW
1.0000 | CHEWABLE_TABLET | Freq: Three times a day (TID) | ORAL | Status: DC | PRN
  Administered 2013-09-07: 600 mg via ORAL
  Filled 2013-09-07: qty 3

## 2013-09-07 NOTE — Progress Notes (Signed)
     Subjective:   Feels 200% better... No pain, only one BM since lat pm,not grossly bloody. Hungry  Creat improved but not normalized hgb 8.1  Objective: Vital signs in last 24 hours: Temp:  [97.6 F (36.4 C)-98.3 F (36.8 C)] 97.6 F (36.4 C) (03/23 0436) Pulse Rate:  [69-89] 89 (03/23 0436) Resp:  [15-18] 15 (03/23 0436) BP: (92-108)/(55-75) 92/58 mmHg (03/23 0436) SpO2:  [98 %-100 %] 100 % (03/23 0436) Last BM Date: 09/07/13  PE:   WDWM in Nad  CV; RRR,  Pulm: clear GI; soft ,nontender, Bs+  Lab Results:  Cdiff negative  Recent Labs  09/05/13 1658 09/06/13 0415 09/07/13 0440  WBC 14.7* 8.8 15.5*  HGB 9.3* 8.5* 8.1*  HCT 29.0* 25.4* 24.9*  PLT 439* 496* 507*   BMET  Recent Labs  09/05/13 1658 09/05/13 2335 09/06/13 0415 09/07/13 0440  NA 135*  --  130* 135*  K 5.7* 4.9 5.0 5.2  CL 99  --  98 103  CO2 19  --  20 19  GLUCOSE 93  --  123* 144*  BUN 44*  --  39* 41*  CREATININE 2.55*  --  2.10* 1.87*  CALCIUM 8.8  --  7.8* 7.8*   LFT  Recent Labs  09/05/13 1658  PROT 6.0  ALBUMIN 2.4*  AST 17  ALT 23  ALKPHOS 58  BILITOT 0.4      Studies/Results: Ct Abdomen Pelvis Wo Contrast  09/05/2013   CLINICAL DATA:  Diarrhea abdominal pain and weakness. Ulcerative colitis.  EXAM: CT ABDOMEN AND PELVIS WITHOUT CONTRAST  TECHNIQUE: Multidetector CT imaging of the abdomen and pelvis was performed following the standard protocol without intravenous contrast.  COMPARISON:  03/11/2013  FINDINGS: Mild diffuse colitis is again seen, and shows no significant change compared to prior study. No evidence of involvement of the terminal ileum or bowel obstruction. There is no evidence of abscess, fistula, or free air.  Noncontrast images of the liver, spot pancreas, spleen, and adrenal glands are unremarkable. Cholelithiasis is again seen, without evidence of cholecystitis or biliary dilatation. Bilateral fluid attenuation renal cysts are stable and there is no evidence  of renal calculi or hydronephrosis. No soft tissue masses or lymphadenopathy identified.  IMPRESSION: Mild diffuse colitis, without significant change compared to previous study. This is consistent with patient's history of ulcerative colitis.  No evidence of abscess, bowel obstruction, or other complication.  Cholelithiasis. No radiographic evidence of cholecystitis.   Electronically Signed   By: Earle Gell M.D.   On: 09/05/2013 19:09     Assessment/Plan:  #1 68 yo male with newly dx pan ulcerative colitis poorly controlled on mesalamine Rx,then acute worsening 3/21- ? Infectious  Superimposed. Pt develpoed renal insuff and hypotension. Much better on Iv steroids, Asacol #2 progressive anemia  Secondary to blood loss  Will stop antibiotics Continue Iv solumedrol today Continue hydration Advance diet to soft Transfuse for hgb less than 7-8 Home next 48 hours    LOS: 2 days   Amy Esterwood 09/07/2013, 8:56 AM  [facility] Attending  I have also seen and assessed the patient and agree with the above note. Gatha Mayer, MD, [facility] [phone] (pager) 09/07/2013 5:00 PM

## 2013-09-07 NOTE — Progress Notes (Addendum)
[facility] PROGRESS NOTE  Sean Pope PYP:[id number] DOB: August 08, 1945 DOA: 09/05/2013 PCP: Tawanna Solo, MD  Brief narrative 68 y.o. male, with past medical history significant for hypertension and intracerebral bleed in 2013 was recently diagnosed with ulcerative colitis in December 2014 presenting with worsening diarrhea bloody at times, no nausea or vomiting, mild abdominal cramps.    Assessment/Plan: Exacerbation of ulcerative colitis CT abdomen shows mild diffuse colitis. Started on IV prednisone. Reduced dose to 40 mg daily today. plan for discharge on slow taper. Continue mesalamine (dose increased), CD3 negative. On empiric ciprofloxacin and Flagyl. Appreciate GI recommendations. -Diet advanced to soft  Acute kidney injury Prerenal secondary to dehydration. Renal function is slowly improving with IV fluids. Monitor ai am. Avoid nephrotoxins.  Dehydration Continue IV normal saline. Check orthostasis  Hypotension Order blood pressure medications as BP low normal and patient orthostatic by pulse on admission.  Anemia Secondary to blood loss and iron deficiency. Hemoglobin 8.1 today. We'll transfuse if less than 8. Continue iron supplements   DVT prophylaxis: SCDs Code Status: Full code Family Communication: None at bedside Disposition Plan: Home if improved in a.m.   Consultants:  GI  Procedures:  None  Antibiotics:  IV ciprofloxacin and Flagyl  HPI/Subjective: Patient seen and examined this morning. Had one episode of loose bowel movement. Denies any abdominal pain.  Objective: Filed Vitals:   09/07/13 0436  BP: 92/58  Pulse: 89  Temp: 97.6 F (36.4 C)  Resp: 15    Intake/Output Summary (Last 24 hours) at 09/07/13 1423 Last data filed at 09/07/13 0549  Gross per 24 hour  Intake   2320 ml  Output    750 ml  Net   1570 ml   Filed Weights   09/05/13 2101  Weight: 73.9 kg (162 lb 14.7 oz)    Exam:   General:  Elderly male in no  acute distress  HEENT: Present, moist oral mucosa  chest: Clear to auscultation bilaterally, no added sounds  CVS: Normal S1-S2, no murmurs rub or gallop  Abdomen: Soft, nondistended, nontender, bowel sounds present  Extremities: Warm, no edema  CNS: AAO x3    Data Reviewed: Basic Metabolic Panel:  Recent Labs Lab 09/05/13 1658 09/05/13 2335 09/06/13 0415 09/07/13 0440  NA 135*  --  130* 135*  K 5.7* 4.9 5.0 5.2  CL 99  --  98 103  CO2 19  --  20 19  GLUCOSE 93  --  123* 144*  BUN 44*  --  39* 41*  CREATININE 2.55*  --  2.10* 1.87*  CALCIUM 8.8  --  7.8* 7.8*   Liver Function Tests:  Recent Labs Lab 09/05/13 1658  AST 17  ALT 23  ALKPHOS 58  BILITOT 0.4  PROT 6.0  ALBUMIN 2.4*   No results found for this basename: LIPASE, AMYLASE,  in the last 168 hours No results found for this basename: AMMONIA,  in the last 168 hours CBC:  Recent Labs Lab 09/05/13 1658 09/06/13 0415 09/07/13 0440  WBC 14.7* 8.8 15.5*  NEUTROABS 11.1*  --   --   HGB 9.3* 8.5* 8.1*  HCT 29.0* 25.4* 24.9*  MCV 89.0 87.3 88.0  PLT 439* 496* 507*   Cardiac Enzymes: No results found for this basename: CKTOTAL, CKMB, CKMBINDEX, TROPONINI,  in the last 168 hours BNP (last 3 results) No results found for this basename: PROBNP,  in the last 8760 hours CBG: No results found for this basename: GLUCAP,  in the last 168  hours  Recent Results (from the past 240 hour(s))  CLOSTRIDIUM DIFFICILE BY PCR     Status: None   Collection Time    09/05/13  5:20 PM      Result Value Ref Range Status   C difficile by pcr NEGATIVE  NEGATIVE Final   Comment: Performed at [facility] DIFFICILE BY PCR     Status: None   Collection Time    09/06/13  9:23 AM      Result Value Ref Range Status   C difficile by pcr NEGATIVE  NEGATIVE Final   Comment: Performed at [facility]     Studies: Ct Abdomen Pelvis Wo Contrast  09/05/2013   CLINICAL DATA:  Diarrhea abdominal  pain and weakness. Ulcerative colitis.  EXAM: CT ABDOMEN AND PELVIS WITHOUT CONTRAST  TECHNIQUE: Multidetector CT imaging of the abdomen and pelvis was performed following the standard protocol without intravenous contrast.  COMPARISON:  03/11/2013  FINDINGS: Mild diffuse colitis is again seen, and shows no significant change compared to prior study. No evidence of involvement of the terminal ileum or bowel obstruction. There is no evidence of abscess, fistula, or free air.  Noncontrast images of the liver, spot pancreas, spleen, and adrenal glands are unremarkable. Cholelithiasis is again seen, without evidence of cholecystitis or biliary dilatation. Bilateral fluid attenuation renal cysts are stable and there is no evidence of renal calculi or hydronephrosis. No soft tissue masses or lymphadenopathy identified.  IMPRESSION: Mild diffuse colitis, without significant change compared to previous study. This is consistent with patient's history of ulcerative colitis.  No evidence of abscess, bowel obstruction, or other complication.  Cholelithiasis. No radiographic evidence of cholecystitis.   Electronically Signed   By: Earle Gell M.D.   On: 09/05/2013 19:09    Scheduled Meds: . ferrous sulfate  325 mg Oral Daily  . Mesalamine  800 mg Oral TID  . methylPREDNISolone (SOLU-MEDROL) injection  40 mg Intravenous Q24H  . simvastatin  40 mg Oral QPM   Continuous Infusions: . sodium chloride Stopped (09/05/13 1951)  . sodium chloride 100 mL/hr at 09/07/13 6269      Time spent: 25 minutes    Makaia Rappa  [facility] Pager [phone]. If 7PM-7AM, please contact night-coverage at [email or web address], password [facility] 09/07/2013, 2:23 PM  LOS: 2 days

## 2013-09-08 ENCOUNTER — Other Ambulatory Visit: Payer: Self-pay | Admitting: *Deleted

## 2013-09-08 DIAGNOSIS — I951 Orthostatic hypotension: Secondary | ICD-10-CM | POA: Diagnosis present

## 2013-09-08 DIAGNOSIS — N183 Chronic kidney disease, stage 3 unspecified: Secondary | ICD-10-CM | POA: Diagnosis present

## 2013-09-08 DIAGNOSIS — K5190 Ulcerative colitis, unspecified, without complications: Secondary | ICD-10-CM

## 2013-09-08 DIAGNOSIS — N184 Chronic kidney disease, stage 4 (severe): Secondary | ICD-10-CM | POA: Diagnosis present

## 2013-09-08 HISTORY — DX: Chronic kidney disease, stage 3 unspecified: N18.30

## 2013-09-08 LAB — CBC
HCT: 27.1 % — ABNORMAL LOW (ref 39.0–52.0)
HEMOGLOBIN: 9.1 g/dL — AB (ref 13.0–17.0)
MCH: 29.3 pg (ref 26.0–34.0)
MCHC: 33.6 g/dL (ref 30.0–36.0)
MCV: 87.1 fL (ref 78.0–100.0)
Platelets: 576 10*3/uL — ABNORMAL HIGH (ref 150–400)
RBC: 3.11 MIL/uL — ABNORMAL LOW (ref 4.22–5.81)
RDW: 15.5 % (ref 11.5–15.5)
WBC: 23.8 10*3/uL — ABNORMAL HIGH (ref 4.0–10.5)

## 2013-09-08 LAB — FECAL LACTOFERRIN, QUANT: Fecal Lactoferrin: POSITIVE

## 2013-09-08 LAB — BASIC METABOLIC PANEL
BUN: 48 mg/dL — ABNORMAL HIGH (ref 6–23)
CALCIUM: 8.3 mg/dL — AB (ref 8.4–10.5)
CO2: 20 meq/L (ref 19–32)
Chloride: 104 mEq/L (ref 96–112)
Creatinine, Ser: 1.82 mg/dL — ABNORMAL HIGH (ref 0.50–1.35)
GFR calc Af Amer: 42 mL/min — ABNORMAL LOW (ref >90)
GFR, EST NON AFRICAN AMERICAN: 36 mL/min — AB (ref >90)
GLUCOSE: 135 mg/dL — AB (ref 70–99)
Potassium: 4.9 mEq/L (ref 3.7–5.3)
Sodium: 134 mEq/L — ABNORMAL LOW (ref 137–147)

## 2013-09-08 MED ORDER — PREDNISONE 20 MG PO TABS: 40.0000 mg | ORAL_TABLET | Freq: Every day | ORAL | Status: DC

## 2013-09-08 MED ORDER — CALCIUM CARBONATE ANTACID 500 MG PO CHEW: 1.0000 | CHEWABLE_TABLET | Freq: Three times a day (TID) | ORAL | Status: DC | PRN

## 2013-09-08 MED ORDER — MESALAMINE 400 MG PO TBEC: 800.0000 mg | DELAYED_RELEASE_TABLET | Freq: Three times a day (TID) | ORAL | Status: DC

## 2013-09-08 NOTE — Discharge Instructions (Signed)
Ulcerative Colitis Ulcerative colitis is a long lasting swelling and soreness (inflammation) of the colon (large intestine). In patients with ulcerative colitis, sores (ulcers) and inflammation of the inner lining of the colon lead to illness. Ulcerative colitis can also cause problems outside the digestive tract.  Ulcerative colitis is closely related to another condition of inflammation of the intestines called Crohn's disease. Together, they are frequently referred to as inflammatory bowel disease (IBD). Ulcerative colitis and Crohn's diseases are conditions that can last years to decades. Men and women are affected equally. They most commonly begin during adolescence and early adulthood. SYMPTOMS  Common symptoms of ulcerative colitis include rectal bleeding and diarrhea. There is a wide range of symptoms among patients with this disease depending on how severe the disease is. Some of these symptoms are:  Abdominal pain or cramping.  Diarrhea.  Fever.  Tiredness (fatigue).  Weight loss.  Night sweats.  Rectal pain.  Feeling the immediate need to have a bowel movement (rectal urgency). CAUSES  Ulcerative colitis is caused by increased activity of the immune system in the intestines. The immune system is the system that protects the body against disease such as harmful bacteria, viruses, fungi, and other foreign invaders. When the immune system overacts, it causes inflammation. The cause of the increased immune system activity is not known. This over activity causes long-lasting inflammation and ulceration. This condition may be passed down from your parents (inherited). Brothers, sisters, children, and parents of patients with IBD are more likely to develop these diseases. It is not contagious. This means you cannot catch it from someone else. DIAGNOSIS  Your caregiver may suspect ulcerative colitis based on your symptoms and exam. Blood tests may confirm that there is a problem. You may  be asked to submit a stool specimen for examination. X-rays and CT scans may be necessary. Ultimately, the diagnosis is usually made after a flexible tube is inserted via your anus and your colon is examined under sedation (colonoscopy). With this test, the specialist can take a tiny tissue sample from inside the bowel (biopsy). Examination of this biopsy tissue under a microscopy can reveal ulcerative colitis as the cause of your symptoms. TREATMENT   There is no cure for ulcerative colitis.  Complications such as massive bleeding from the colon (hemorrhage), development of a hole in the colon (perforation), or the development of precancerous or cancerous changes of the colon may require surgery.  Medications are often used to decrease inflammation and control the immune system. These include medicines related to aspirin, steroid medications, and newer and stronger medications to slow down the immune system. Some medications may be used as suppositories or enemas. A number of other medications are used or have been studied. Your caregiver will make specific recommendations. HOME CARE INSTRUCTIONS   There is no cure for ulcerative colitis disease. The best treatment is frequent checkups with your caregiver. Periodic reevaluation is important.  Symptoms such as diarrhea can be controlled with medications. Avoid foods that have a laxative effect such fresh fruit and vegetables and dairy products. During flare ups, you can rest your bowel by staying away from solid foods. Drink clear liquids frequently during the day. Electrolyte or rehydrating fluids are best. Your caregiver can help you with suggestions. Drink often to prevent dehydration. When diarrhea has cleared, eat smaller meals and more often. Avoid food additives and stimulants such as caffeine (coffee, tea, many sodas, or chocolate). Avoid dairy products. Enzyme supplements may help if you develop intolerance to  a sugar in dairy products  (lactose). Ask your caregiver or dietitian about specific dietary instructions.  If you had surgery, be sure you understand your care instructions thoroughly, including proper care of any surgical wounds.  Take any medications exactly as prescribed.  Try to maintain a positive attitude. Learn relaxation techniques such as self hypnosis, mental imaging, and muscle relaxation. If possible, avoid stresses that aggravate your condition. Exercise regularly. Follow your diet. Always get plenty of rest. SEEK MEDICAL CARE IF:   Your symptoms fail to improve after a week or two of new treatment.  You experience continued weight loss.  You have ongoing crampy digestion or loose bowels.  You develop a new skin rash, skin sores, or eye problems. SEEK IMMEDIATE MEDICAL CARE IF:   You have worsening of your symptoms or develop new symptoms.  You have an oral temperature above 102 F (38.9 C), not controlled by medicine.  You develop bloody diarrhea.  You have severe abdominal pain. Document Released: 03/14/2005 Document Revised: 08/27/2011 Document Reviewed: 02/11/2007 [facility] Patient Information 2014 [place], [place].

## 2013-09-08 NOTE — Progress Notes (Signed)
Patient ID: Sean Pope, male   DOB: [birth year], 68 y.o.   MRN: [id number] [facility] Progress Note  Subjective: Day # 3 hospital stay- he feels good-wants to go home. Tolerating solid food without difficulty,no pain or cramping. One BM last night -small amt of blood and semi formed HGb stable 9.1 Creat 1.8---fluids were stopped yesterday  Objective:  Vital signs in last 24 hours: Temp:  [97.8 F (36.6 C)-98.3 F (36.8 C)] 97.9 F (36.6 C) (03/24 0923) Pulse Rate:  [69-98] 98 (03/24 0608) Resp:  [16-18] 18 (03/24 3007) BP: (109-118)/(62-69) 118/66 mmHg (03/24 0608) SpO2:  [98 %-100 %] 98 % (03/24 0608) Last BM Date: 09/07/13 General:   Alert,  Well-developed,WM    in NAD Heart:  Regular rate and rhythm; no murmurs Pulm;claer Abdomen:  Soft, nontender and nondistended. Normal bowel sounds, without guarding, and without rebound.   Extremities:  Without edema. Neurologic:  Alert and  oriented x4;  grossly normal neurologically. Psych:  Alert and cooperative. Normal mood and affect.   Lab Results:  Recent Labs  09/06/13 0415 09/07/13 0440 09/08/13 0330  WBC 8.8 15.5* 23.8*  HGB 8.5* 8.1* 9.1*  HCT 25.4* 24.9* 27.1*  PLT 496* 507* 576*   BMET  Recent Labs  09/06/13 0415 09/07/13 0440 09/08/13 0330  NA 130* 135* 134*  K 5.0 5.2 4.9  CL 98 103 104  CO2 20 19 20   GLUCOSE 123* 144* 135*  BUN 39* 41* 48*  CREATININE 2.10* 1.87* 1.82*  CALCIUM 7.8* 7.8* 8.3*   LFT  Recent Labs  09/05/13 1658  PROT 6.0  ALBUMIN 2.4*  AST 17  ALT 23  ALKPHOS 58  BILITOT 0.4     Assessment / Plan: #1   68 yo male with new dx of ulcerative colitis- with acute worsening of diarrhea/bleeding last week-admitted with hypotension/dehydration Much better. No active bleeding #2  ARI- improved but parameters not normalized yet #3  Leukocytosis-clinically appears well-suspect leukemoid rxn from steroids  Ok for discharge home today from GI standpoint Office follow up  with AmyEsterwood PA-C /[facility] on Monday 3/30 at 8:30 am, will repeat CBC and BMET then. Pt to stay out of work this week,push oral fluids Continue Asacol 870m ,2 po TID Prednisone 40 mg po QAM- will plan gradual taper as outpt Active Problems:   Colitis     LOS: 3 days   Amy Esterwood  09/08/2013, 9:22 AM  Agree with Ms. EGenia Haroldassessment and plan. CGatha Mayer MD, [facility]

## 2013-09-08 NOTE — Discharge Summary (Signed)
Physician Discharge Summary  Sean Pope XHB:[id number] DOB: [birth year] DOA: 09/05/2013  PCP: Tawanna Solo, MD  Admit date: 09/05/2013 Discharge date: 09/08/2013  Time spent: 40 minutes  Recommendations for Outpatient Follow-up:  Discharge home with outpatient PCP and GI followup Please check renal function during outpatient followup.  Discharge Diagnoses:   Principal problem Exacerbation of ulcerative colitis   Active Problems:   Heartburn   Orthostatic hypotension   AKI (acute kidney injury)   Discharge Condition: Fair  Diet recommendation: Advance to regular.  Filed Weights   09/05/13 2101  Weight: 73.9 kg (162 lb 14.7 oz)    History of present illness:  Please refer to admission H&P for details, but in brief, 68 y.o. male, with past medical history significant for hypertension and intracerebral bleed in 2013 was recently diagnosed with ulcerative colitis in December 2014 presenting with worsening diarrhea bloody at times, no nausea or vomiting, mild abdominal cramps.   Hospital Course:  Exacerbation of ulcerative colitis  CT abdomen shows mild diffuse colitis. Started on IV prednisone.  -mesalamine dose increased to 800 mg 3 times a day.  stool for C. difficile negative. Patient was placed on empiric ciprofloxacin and Flagyl which has been discontinued today. Appreciate GI recommendations. Patient has tolerated advanced diet and can be discharged home today on daily oral prednisone 40 mg until seen by GI in the clinic next week Wednesday and will decide on tapering his prednisone dose.   Acute kidney injury  Prerenal secondary to dehydration. Renal function is slowly improving with IV fluids. Creatinine of 1.8 today.. Avoid nephrotoxins. Patient was on HCTZ and lisinopril as outpatient for his blood pressure which I have discontinued given his acute kidney injury the, dehydration and hypotension on presentation.  Dehydration  Improved with hydration.  Hypotension resolved. Blood pressure medications discontinued upon discharge.   Anemia  Secondary to blood loss and iron deficiency. Hemoglobin 9 today. Continue iron supplements   Leukocytosis WBC of 22.8 today and is likely secondary to steroid use. Repeat blood work as outpatient in one week.  Code Status: Full code  Family Communication: None at bedside  Disposition Plan: Home with outpatient followup  Consultants:  [facility]  Procedures:  None   Antibiotics:  IV ciprofloxacin and Flagyl (3/21-3/24)     Discharge Exam: Filed Vitals:   09/08/13 0608  BP: 118/66  Pulse: 98  Temp: 97.9 F (36.6 C)  Resp: 18    General: Elderly male in no acute distress  HEENT: Present, moist oral mucosa  chest: Clear to auscultation bilaterally, no added sounds  CVS: Normal S1-S2, no murmurs rub or gallop  Abdomen: Soft, nondistended, nontender, bowel sounds present  Extremities: Warm, no edema  CNS: AAO x3  Discharge Instructions   Future Appointments Provider Department Dept Phone   09/14/2013 8:30 AM Amy Genia Harold, PA-C [facility] [phone]       Medication List    STOP taking these medications       losartan-hydrochlorothiazide 50-12.5 MG per tablet  Commonly known as:  HYZAAR      TAKE these medications       calcium carbonate 500 MG chewable tablet  Commonly known as:  TUMS - dosed in mg elemental calcium  Chew 1 tablet (200 mg of elemental calcium total) by mouth 3 (three) times daily as needed for indigestion or heartburn.     Iron 325 (65 FE) MG Tabs  Take 1 tablet by mouth daily.     mesalamine 400 MG  EC tablet  Commonly known as:  ASACOL  Take 2 tablets (800 mg total) by mouth 3 (three) times daily.     predniSONE 20 MG tablet  Commonly known as:  DELTASONE  Take 2 tablets (40 mg total) by mouth daily with breakfast.     simvastatin 40 MG tablet  Commonly known as:  ZOCOR  Take 40 mg by mouth every evening.        No Known Allergies     Follow-up Information   Follow up with Nicoletta Ba, PA-C On 09/14/2013. (at 8:30 am - arrive at 8 am and go to lab in basement first )    Specialty:  Gastroenterology   Contact information:   [address] [place] [address] [phone]       Follow up with Tawanna Solo, MD In 1 week.   Specialty:  Family Medicine   Contact information:   [place] [address] [phone]        The results of significant diagnostics from this hospitalization (including imaging, microbiology, ancillary and laboratory) are listed below for reference.    Significant Diagnostic Studies: Ct Abdomen Pelvis Wo Contrast  09/05/2013   CLINICAL DATA:  Diarrhea abdominal pain and weakness. Ulcerative colitis.  EXAM: CT ABDOMEN AND PELVIS WITHOUT CONTRAST  TECHNIQUE: Multidetector CT imaging of the abdomen and pelvis was performed following the standard protocol without intravenous contrast.  COMPARISON:  03/11/2013  FINDINGS: Mild diffuse colitis is again seen, and shows no significant change compared to prior study. No evidence of involvement of the terminal ileum or bowel obstruction. There is no evidence of abscess, fistula, or free air.  Noncontrast images of the liver, spot pancreas, spleen, and adrenal glands are unremarkable. Cholelithiasis is again seen, without evidence of cholecystitis or biliary dilatation. Bilateral fluid attenuation renal cysts are stable and there is no evidence of renal calculi or hydronephrosis. No soft tissue masses or lymphadenopathy identified.  IMPRESSION: Mild diffuse colitis, without significant change compared to previous study. This is consistent with patient's history of ulcerative colitis.  No evidence of abscess, bowel obstruction, or other complication.  Cholelithiasis. No radiographic evidence of cholecystitis.   Electronically Signed   By: Earle Gell M.D.   On: 09/05/2013 19:09    Microbiology: Recent Results  (from the past 240 hour(s))  CLOSTRIDIUM DIFFICILE BY PCR     Status: None   Collection Time    09/05/13  5:20 PM      Result Value Ref Range Status   C difficile by pcr NEGATIVE  NEGATIVE Final   Comment: Performed at [facility] DIFFICILE BY PCR     Status: None   Collection Time    09/06/13  9:23 AM      Result Value Ref Range Status   C difficile by pcr NEGATIVE  NEGATIVE Final   Comment: Performed at [facility]: Basic Metabolic Panel:  Recent Labs Lab 09/05/13 1658 09/05/13 2335 09/06/13 0415 09/07/13 0440 09/08/13 0330  NA 135*  --  130* 135* 134*  K 5.7* 4.9 5.0 5.2 4.9  CL 99  --  98 103 104  CO2 19  --  20 19 20   GLUCOSE 93  --  123* 144* 135*  BUN 44*  --  39* 41* 48*  CREATININE 2.55*  --  2.10* 1.87* 1.82*  CALCIUM 8.8  --  7.8* 7.8* 8.3*   Liver Function Tests:  Recent  Labs Lab 09/05/13 1658  AST 17  ALT 23  ALKPHOS 58  BILITOT 0.4  PROT 6.0  ALBUMIN 2.4*   No results found for this basename: LIPASE, AMYLASE,  in the last 168 hours No results found for this basename: AMMONIA,  in the last 168 hours CBC:  Recent Labs Lab 09/05/13 1658 09/06/13 0415 09/07/13 0440 09/08/13 0330  WBC 14.7* 8.8 15.5* 23.8*  NEUTROABS 11.1*  --   --   --   HGB 9.3* 8.5* 8.1* 9.1*  HCT 29.0* 25.4* 24.9* 27.1*  MCV 89.0 87.3 88.0 87.1  PLT 439* 496* 507* 576*   Cardiac Enzymes: No results found for this basename: CKTOTAL, CKMB, CKMBINDEX, TROPONINI,  in the last 168 hours BNP: BNP (last 3 results) No results found for this basename: PROBNP,  in the last 8760 hours CBG: No results found for this basename: GLUCAP,  in the last 168 hours     Signed:  Louellen Molder  [facility] 09/08/2013, 11:31 AM

## 2013-09-14 ENCOUNTER — Encounter: Payer: Self-pay | Admitting: Physician Assistant

## 2013-09-14 ENCOUNTER — Other Ambulatory Visit (INDEPENDENT_AMBULATORY_CARE_PROVIDER_SITE_OTHER): Payer: Medicare HMO

## 2013-09-14 ENCOUNTER — Ambulatory Visit (INDEPENDENT_AMBULATORY_CARE_PROVIDER_SITE_OTHER): Payer: Medicare HMO | Admitting: Physician Assistant

## 2013-09-14 VITALS — BP 142/78 | HR 70 | Ht 71.0 in | Wt 170.0 lb

## 2013-09-14 DIAGNOSIS — K5100 Ulcerative (chronic) pancolitis without complications: Secondary | ICD-10-CM

## 2013-09-14 DIAGNOSIS — D62 Acute posthemorrhagic anemia: Secondary | ICD-10-CM

## 2013-09-14 DIAGNOSIS — K5190 Ulcerative colitis, unspecified, without complications: Secondary | ICD-10-CM

## 2013-09-14 LAB — BASIC METABOLIC PANEL
BUN: 25 mg/dL — AB (ref 6–23)
CO2: 28 mEq/L (ref 19–32)
Calcium: 9 mg/dL (ref 8.4–10.5)
Chloride: 104 mEq/L (ref 96–112)
Creatinine, Ser: 1.5 mg/dL (ref 0.4–1.5)
GFR: 49.07 mL/min — AB (ref >60.00)
Glucose, Bld: 110 mg/dL — ABNORMAL HIGH (ref 70–99)
POTASSIUM: 4.1 meq/L (ref 3.5–5.1)
SODIUM: 135 meq/L (ref 135–145)

## 2013-09-14 MED ORDER — PREDNISONE 20 MG PO TABS: ORAL_TABLET | ORAL | Status: DC

## 2013-09-14 MED ORDER — MESALAMINE 800 MG PO TBEC: DELAYED_RELEASE_TABLET | ORAL | Status: DC

## 2013-09-14 NOTE — Patient Instructions (Signed)
We sent prescriptions to [facility]. 1.Prednisone take as directed. 2. Asachol HD  Restart Hyzaar 50/12.5 mg.  Follow up with Dr. Carlean Purl or Amy Esterwood in 1 month.  Call us the week of 4-20 regarding an appointment.

## 2013-09-14 NOTE — Progress Notes (Signed)
Subjective:    Patient ID: Sean Pope, male    DOB: [birth year], 68 y.o.   MRN: [id number]  HPI  Sean Pope is a very nice 68 year old white male known to Dr. Carlean Purl who was recently admitted [facility] 3 09/08/2013 with exacerbation of newly diagnosed ulcerative colitis. He was found on CT scan to have a pancolitis. He had been initially diagnosed in December of 2014 at colonoscopy and had universal ulcerative colitis. He he was to start on Asacol HD 800 mg 3 times daily. Apparently there was some confusion with his insurance and cost of medication and he really was not taking a mesalamine regularly prior to this most recent hospitalization. He presented with severe diarrhea rectal bleeding and hypotension. He also had some acute renal insufficiency. Was felt this was a combination of dehydration from diarrhea and diuretics which he had been on for blood pressure control. He responded quickly to IV fluids IV steroids and was placed on Asacol HD . He did not require transfusion but hemoglobin was 9 on discharge from the hospital. He was discharged home on 40 mg of prednisone in addition  To Asacol HD 1 tablet 3 times daily.  Patient comes in today for followup stating that he is been doing well since discharge from the hospital. He is not having any further rectal bleeding. He did have one day of more significant diarrhea and put himself back on liquids for 24 hours. Says over the past 2 days he's been doing well his bowel movements have returned to normal and he is only having one or 2 stools per day. He is concerned because he has had gradual development of lower extremity edema. He was left off of blood pressure medicine on discharge from the hospital and says he has developed significant edema in both of his lower extremities over the past week .he says this has occurred in the past and generally with resuming his diuretic results quickly.    Review of Systems  Constitutional: Positive for fatigue.    HENT: Negative.   Eyes: Negative.   Respiratory: Negative.   Cardiovascular: Positive for leg swelling.  Gastrointestinal: Positive for diarrhea.  Endocrine: Negative.   Genitourinary: Negative.   Musculoskeletal: Negative.   Neurological: Negative.   Hematological: Negative.   Psychiatric/Behavioral: Negative.    Outpatient Prescriptions Prior to Visit  Medication Sig Dispense Refill  . calcium carbonate (TUMS - DOSED IN MG ELEMENTAL CALCIUM) 500 MG chewable tablet Chew 1 tablet (200 mg of elemental calcium total) by mouth 3 (three) times daily as needed for indigestion or heartburn.  30 tablet  0  . Ferrous Sulfate (IRON) 325 (65 FE) MG TABS Take 1 tablet by mouth daily.      . simvastatin (ZOCOR) 40 MG tablet Take 40 mg by mouth every evening.      . mesalamine (ASACOL) 400 MG EC tablet Take 2 tablets (800 mg total) by mouth 3 (three) times daily.  60 tablet  0  . predniSONE (DELTASONE) 20 MG tablet Take 2 tablets (40 mg total) by mouth daily with breakfast.  20 tablet  0   No facility-administered medications prior to visit.   No Known Allergies Patient Active Problem List   Diagnosis Date Noted  . AKI (acute kidney injury) 09/08/2013  . Colitis 09/05/2013  . Ulcerative chronic pancolitis 05/28/2013  . Heartburn 04/09/2013  . Posterior communicating artery aneurysm 09/17/2011   History  Substance Use Topics  . Smoking status: Former Smoker -- 0.50 packs/day for  50 years    Types: Cigarettes  . Smokeless tobacco: Never Used  . Alcohol Use: No       Objective:   Physical Exam  and and and and and older white male in no acute distress blood pressure 142/78 pulse 70 height 5 foot 11 weight 170. HEENT; nontraumatic normocephalic EOMI PERRLA sclera anicteric, Supple; no JVD, Cardiovascular; regular rate and rhythm with S1-S2 no murmur or gallop, Pulmonary ;clear bilaterally, Abdomen; soft nontender nondistended bowel sounds are active, Rectal; not done, Extremities; he has 2+  edema bilateral lower studies left greater than right to the knees, Psych; mood and affect appropriate        Assessment & Plan:  #76  68 year old male with pan ulcerative colitis with recent admission due to exacerbation with significant diarrhea and rectal bleeding. Patient presented hypotensive. Patient is much improved on oral steroids and mesalamine therapy #2 anemia secondary to acute blood loss #3 acute kidney injury resolved #4 lower extremity edema-likely combination of steroids and stopping Hyzaar  Plan; decreased prednisone to 30 mg by mouth daily x1 week then 20 mg by mouth daily x2 weeks and 15 mg daily until return office visit Increase Asacol HD to 4.8 g per day which is 2 tablets 3 times daily CBC and be met pending this morning Resume Hyzaar 50/12.5 every morning Patient will followup with Dr. Carlean Purl or myself in one month He is also asked to call on Thursday this week with progress report regarding his edema if he has not had any improvement may benefit from short-term course of Lasix

## 2013-09-15 ENCOUNTER — Telehealth: Payer: Self-pay | Admitting: *Deleted

## 2013-09-15 ENCOUNTER — Other Ambulatory Visit: Payer: Self-pay | Admitting: *Deleted

## 2013-09-15 DIAGNOSIS — D649 Anemia, unspecified: Secondary | ICD-10-CM

## 2013-09-15 LAB — CBC WITH DIFFERENTIAL/PLATELET
Basophils Absolute: 0 10*3/uL (ref 0.0–0.1)
Basophils Relative: 0 % (ref 0.0–3.0)
EOS ABS: 0.1 10*3/uL (ref 0.0–0.7)
Eosinophils Relative: 0.5 % (ref 0.0–5.0)
HCT: 32.1 % — ABNORMAL LOW (ref 39.0–52.0)
Hemoglobin: 10.4 g/dL — ABNORMAL LOW (ref 13.0–17.0)
LYMPHS PCT: 6.7 % — AB (ref 12.0–46.0)
Lymphs Abs: 1.2 10*3/uL (ref 0.7–4.0)
MCHC: 32.3 g/dL (ref 30.0–36.0)
MCV: 90.7 fl (ref 78.0–100.0)
Monocytes Absolute: 0.1 10*3/uL (ref 0.1–1.0)
Monocytes Relative: 0.7 % — ABNORMAL LOW (ref 3.0–12.0)
Neutro Abs: 16.9 10*3/uL — ABNORMAL HIGH (ref 1.4–7.7)
PLATELETS: 584 10*3/uL — AB (ref 150.0–400.0)
RBC: 3.54 Mil/uL — ABNORMAL LOW (ref 4.22–5.81)
RDW: 17 % — ABNORMAL HIGH (ref 11.5–14.6)
WBC: 18.4 10*3/uL (ref 4.5–10.5)

## 2013-09-15 NOTE — Telephone Encounter (Signed)
Patient states he was told to let Nicoletta Ba, PA know if the fluid did not go down. He states he is still retaining fluid. He gets up with swelling and is stays all day. Please, advise.

## 2013-09-15 NOTE — Progress Notes (Signed)
Agree with Ms. Esterwood's assessment and plan. Harrison Zetina E. Nakhia Levitan, MD, FACG   

## 2013-09-16 MED ORDER — FUROSEMIDE 20 MG PO TABS: ORAL_TABLET | ORAL | Status: DC

## 2013-09-16 NOTE — Telephone Encounter (Signed)
Lets  Decrease his prednisone to 20 mg daily -he should stay at that dose for at least 2 weeks . Also call in lasix 20 mg once daily in am x 4 doses-ask him to call back Monday with progress

## 2013-09-16 NOTE — Telephone Encounter (Signed)
Patient left a message for me to call his cell and leave him a message. Called and left recommendations for patient.

## 2013-09-16 NOTE — Telephone Encounter (Signed)
Rx sent to pharmacy. Left a message for patient to call me. 

## 2013-09-21 ENCOUNTER — Telehealth: Payer: Self-pay | Admitting: Physician Assistant

## 2013-09-21 NOTE — Telephone Encounter (Signed)
Carlean Purl pt with history of UC, seen by Nicoletta Ba PA 09/14/13. Pt was placed on prednisone and was given lasix 20m daily X 4days to help with edema in his legs that is thought to be from the steroids. Pt was told to call back to day with an update. Pt states he is down to Prednisone 239mdaily and everything is good except for the edema in his legs, states the lasix did not help at all. Dr. StFuller Plans doc of the day please advise.

## 2013-09-21 NOTE — Telephone Encounter (Signed)
Pt aware and states his PCP is out of the office but he will try contacting that office.

## 2013-09-21 NOTE — Telephone Encounter (Signed)
Since diuretic did not work he should see his PCP for his LE edema.

## 2013-09-24 ENCOUNTER — Telehealth: Payer: Self-pay | Admitting: Physician Assistant

## 2013-09-24 NOTE — Telephone Encounter (Signed)
Patient had labs drawn at his primary care this am [facility].  He would like [place] to get the results from their office.  I have contacted Manuela Schwartz at Dr. Ammie Ferrier office she will fax me a copy of the results

## 2013-09-30 ENCOUNTER — Telehealth: Payer: Self-pay | Admitting: *Deleted

## 2013-09-30 NOTE — Telephone Encounter (Signed)
Left a message for patient to call me. 

## 2013-09-30 NOTE — Telephone Encounter (Signed)
Patient given recommendations. Scheduled OV on 11/17/13 at 8:30 AM with Dr. Carlean Purl.

## 2013-09-30 NOTE — Telephone Encounter (Signed)
Message copied by Hulan Saas on Wed Sep 30, 2013  2:20 PM ------      Message from: [place], [place] S      Created: Wed Sep 30, 2013 11:59 AM       Please call pt- we received a copy of labs from [facility] from 09/24/2013. His hgb is stable at 9.9, and his WBC is back to normal. Just let him know and be sure he has a follow up appt scheduled...he has Ulcerative colitis ------

## 2013-10-06 ENCOUNTER — Telehealth: Payer: Self-pay | Admitting: *Deleted

## 2013-10-06 NOTE — Telephone Encounter (Signed)
Appointment on 11-17-2013 at 8:30 am .

## 2013-10-12 ENCOUNTER — Telehealth: Payer: Self-pay | Admitting: Physician Assistant

## 2013-10-12 ENCOUNTER — Other Ambulatory Visit: Payer: Self-pay

## 2013-10-12 DIAGNOSIS — K5190 Ulcerative colitis, unspecified, without complications: Secondary | ICD-10-CM

## 2013-10-12 NOTE — Telephone Encounter (Signed)
Reduce prednisone to 5 mg daily x 2 weeks and then stop it if he is doing ok. Has REV 6/2

## 2013-10-12 NOTE — Telephone Encounter (Signed)
Can get bladder or kidney problems with Asacol but rare overall.  1) BMET 2) Selling in legs not surprising - doubt major issue -  3) How long has he been at 10 mg predinsone? 4) What is is colitis doing - # stools and consistency of stools?

## 2013-10-12 NOTE — Telephone Encounter (Signed)
Pt states he has been taking Asacol for several weeks now and he read somewhere that you are not supposed to take it for a long time without having your kidneys checked. Pt states that last night he had pain in his left lower quadrant and he could not urinate. Today he states the pain is gone and he is urinating fine. Pt also states that he is taking prednisone 76m daily and he is having some swelling in his legs. States the swelling goes down at night when he sleeps but comes back once he is up for about 386m. Please advise.

## 2013-10-12 NOTE — Telephone Encounter (Signed)
Pt states he has been taking prednisone 85m for about 3 weeks. States that he has been doing well with the number and consistency of stools and has not seen any blood. States he went on line and found different foods to stay away from and is basically gluten free right now.

## 2013-10-12 NOTE — Telephone Encounter (Signed)
Pt aware and order in for labs.

## 2013-10-16 ENCOUNTER — Other Ambulatory Visit (INDEPENDENT_AMBULATORY_CARE_PROVIDER_SITE_OTHER): Payer: Medicare HMO

## 2013-10-16 ENCOUNTER — Other Ambulatory Visit: Payer: Self-pay

## 2013-10-16 DIAGNOSIS — N179 Acute kidney failure, unspecified: Secondary | ICD-10-CM

## 2013-10-16 DIAGNOSIS — K5190 Ulcerative colitis, unspecified, without complications: Secondary | ICD-10-CM

## 2013-10-16 LAB — BASIC METABOLIC PANEL
BUN: 28 mg/dL — ABNORMAL HIGH (ref 6–23)
CALCIUM: 8.6 mg/dL (ref 8.4–10.5)
CO2: 22 meq/L (ref 19–32)
Chloride: 103 mEq/L (ref 96–112)
Creatinine, Ser: 3.3 mg/dL — ABNORMAL HIGH (ref 0.4–1.5)
GFR: 19.69 mL/min — ABNORMAL LOW (ref >60.00)
Glucose, Bld: 128 mg/dL — ABNORMAL HIGH (ref 70–99)
POTASSIUM: 4.6 meq/L (ref 3.5–5.1)
SODIUM: 133 meq/L — AB (ref 135–145)

## 2013-10-16 NOTE — Progress Notes (Signed)
Quick Note:  Kidney function is abnormal Very rare for Asacol to cause this but since possible needs to stop that Needs to have BMET, Phosphorus and Calcium on Monday along with urinalysis, spot Urine Na and creatinine - will need results checked by a colleague or APP please  Dx acute renal failure ______

## 2013-10-20 ENCOUNTER — Other Ambulatory Visit (INDEPENDENT_AMBULATORY_CARE_PROVIDER_SITE_OTHER): Payer: Medicare HMO

## 2013-10-20 ENCOUNTER — Telehealth: Payer: Self-pay

## 2013-10-20 DIAGNOSIS — N179 Acute kidney failure, unspecified: Secondary | ICD-10-CM

## 2013-10-20 LAB — BASIC METABOLIC PANEL
BUN: 37 mg/dL — AB (ref 6–23)
CO2: 23 mEq/L (ref 19–32)
Calcium: 8.7 mg/dL (ref 8.4–10.5)
Chloride: 108 mEq/L (ref 96–112)
Creatinine, Ser: 4.7 mg/dL (ref 0.4–1.5)
GFR: 13.33 mL/min — AB (ref >60.00)
GLUCOSE: 96 mg/dL (ref 70–99)
Potassium: 4.5 mEq/L (ref 3.5–5.1)
SODIUM: 138 meq/L (ref 135–145)

## 2013-10-20 LAB — URINALYSIS, ROUTINE W REFLEX MICROSCOPIC
Bilirubin Urine: NEGATIVE
Ketones, ur: NEGATIVE
LEUKOCYTES UA: NEGATIVE
NITRITE: NEGATIVE
PH: 6 (ref 5.0–8.0)
SPECIFIC GRAVITY, URINE: 1.025 (ref 1.000–1.030)
Total Protein, Urine: 30 — AB
UROBILINOGEN UA: 0.2 (ref 0.0–1.0)
Urine Glucose: NEGATIVE

## 2013-10-20 LAB — PHOSPHORUS: Phosphorus: 4 mg/dL (ref 2.3–4.6)

## 2013-10-20 LAB — CALCIUM: Calcium: 8.7 mg/dL (ref 8.4–10.5)

## 2013-10-20 NOTE — Progress Notes (Signed)
Quick Note:  He is aware and will see PCP tomorrow. Asacol HD on hold - ? If related to that  Please fax to PCP ______

## 2013-10-20 NOTE — Telephone Encounter (Signed)
Patient notified of creatinine increase to 4.7 and GFR 13.3.  Per Dr. Carlean Purl patient needs to be seen tonight with [facility] walkin clinic or his primary care by tomorrow.  He is taking his wife home from the hospital and will go tomorrow.  I have notified him that I will send the results as soon as I have all of them to his primary care  Dr. Kathyrn Lass.  He verbalized understanding of the importance of going to see his primary care.

## 2013-10-21 LAB — SODIUM, URINE, RANDOM: SODIUM UR: 35 meq/L

## 2013-10-21 LAB — CREATININE, URINE, RANDOM: Creatinine, Urine: 424.4 mg/dL

## 2013-10-21 NOTE — Progress Notes (Signed)
Quick Note:  Noted - please fax to PCP ______

## 2013-10-22 ENCOUNTER — Telehealth: Payer: Self-pay | Admitting: Internal Medicine

## 2013-10-22 NOTE — Telephone Encounter (Signed)
Results faxed again

## 2013-10-26 ENCOUNTER — Encounter (HOSPITAL_COMMUNITY): Payer: Self-pay | Admitting: Emergency Medicine

## 2013-10-26 ENCOUNTER — Inpatient Hospital Stay (HOSPITAL_COMMUNITY)
Admission: EM | Admit: 2013-10-26 | Discharge: 2013-10-31 | DRG: 385 | Disposition: A | Discharge status: Home / Self Care | Payer: Managed Care, Other (non HMO) | Attending: Internal Medicine | Admitting: Internal Medicine

## 2013-10-26 ENCOUNTER — Emergency Department (HOSPITAL_COMMUNITY): Payer: Managed Care, Other (non HMO)

## 2013-10-26 DIAGNOSIS — Z8249 Family history of ischemic heart disease and other diseases of the circulatory system: Secondary | ICD-10-CM

## 2013-10-26 DIAGNOSIS — K5100 Ulcerative (chronic) pancolitis without complications: Principal | ICD-10-CM

## 2013-10-26 DIAGNOSIS — E43 Unspecified severe protein-calorie malnutrition: Secondary | ICD-10-CM

## 2013-10-26 DIAGNOSIS — K219 Gastro-esophageal reflux disease without esophagitis: Secondary | ICD-10-CM

## 2013-10-26 DIAGNOSIS — K5190 Ulcerative colitis, unspecified, without complications: Secondary | ICD-10-CM

## 2013-10-26 DIAGNOSIS — E869 Volume depletion, unspecified: Secondary | ICD-10-CM | POA: Diagnosis present

## 2013-10-26 DIAGNOSIS — K529 Noninfective gastroenteritis and colitis, unspecified: Secondary | ICD-10-CM

## 2013-10-26 DIAGNOSIS — N289 Disorder of kidney and ureter, unspecified: Secondary | ICD-10-CM

## 2013-10-26 DIAGNOSIS — E8809 Other disorders of plasma-protein metabolism, not elsewhere classified: Secondary | ICD-10-CM | POA: Diagnosis present

## 2013-10-26 DIAGNOSIS — R12 Heartburn: Secondary | ICD-10-CM

## 2013-10-26 DIAGNOSIS — N179 Acute kidney failure, unspecified: Secondary | ICD-10-CM

## 2013-10-26 DIAGNOSIS — M7989 Other specified soft tissue disorders: Secondary | ICD-10-CM | POA: Diagnosis present

## 2013-10-26 DIAGNOSIS — D649 Anemia, unspecified: Secondary | ICD-10-CM | POA: Diagnosis present

## 2013-10-26 DIAGNOSIS — Z87891 Personal history of nicotine dependence: Secondary | ICD-10-CM

## 2013-10-26 DIAGNOSIS — R609 Edema, unspecified: Secondary | ICD-10-CM | POA: Diagnosis present

## 2013-10-26 DIAGNOSIS — N170 Acute kidney failure with tubular necrosis: Secondary | ICD-10-CM | POA: Diagnosis present

## 2013-10-26 DIAGNOSIS — I5030 Unspecified diastolic (congestive) heart failure: Secondary | ICD-10-CM

## 2013-10-26 DIAGNOSIS — Z79899 Other long term (current) drug therapy: Secondary | ICD-10-CM

## 2013-10-26 DIAGNOSIS — I671 Cerebral aneurysm, nonruptured: Secondary | ICD-10-CM

## 2013-10-26 DIAGNOSIS — IMO0002 Reserved for concepts with insufficient information to code with codable children: Secondary | ICD-10-CM

## 2013-10-26 DIAGNOSIS — R197 Diarrhea, unspecified: Secondary | ICD-10-CM | POA: Diagnosis present

## 2013-10-26 DIAGNOSIS — Z8673 Personal history of transient ischemic attack (TIA), and cerebral infarction without residual deficits: Secondary | ICD-10-CM

## 2013-10-26 HISTORY — DX: Unspecified severe protein-calorie malnutrition: E43

## 2013-10-26 LAB — URINALYSIS, ROUTINE W REFLEX MICROSCOPIC
Bilirubin Urine: NEGATIVE
Glucose, UA: NEGATIVE mg/dL
Hgb urine dipstick: NEGATIVE
Ketones, ur: NEGATIVE mg/dL
LEUKOCYTES UA: NEGATIVE
NITRITE: NEGATIVE
PH: 6 (ref 5.0–8.0)
Protein, ur: NEGATIVE mg/dL
SPECIFIC GRAVITY, URINE: 1.005 (ref 1.005–1.030)
Urobilinogen, UA: 0.2 mg/dL (ref 0.0–1.0)

## 2013-10-26 LAB — COMPREHENSIVE METABOLIC PANEL
ALBUMIN: 2 g/dL — AB (ref 3.5–5.2)
ALT: 12 U/L (ref 0–53)
AST: 14 U/L (ref 0–37)
Alkaline Phosphatase: 75 U/L (ref 39–117)
BUN: 29 mg/dL — ABNORMAL HIGH (ref 6–23)
CO2: 30 mEq/L (ref 19–32)
Calcium: 12.1 mg/dL — ABNORMAL HIGH (ref 8.4–10.5)
Chloride: 98 mEq/L (ref 96–112)
Creatinine, Ser: 2.39 mg/dL — ABNORMAL HIGH (ref 0.50–1.35)
GFR calc Af Amer: 30 mL/min — ABNORMAL LOW (ref >90)
GFR calc non Af Amer: 26 mL/min — ABNORMAL LOW (ref >90)
Glucose, Bld: 99 mg/dL (ref 70–99)
POTASSIUM: 4.9 meq/L (ref 3.7–5.3)
Sodium: 135 mEq/L — ABNORMAL LOW (ref 137–147)
Total Bilirubin: 0.3 mg/dL (ref 0.3–1.2)
Total Protein: 5.4 g/dL — ABNORMAL LOW (ref 6.0–8.3)

## 2013-10-26 LAB — CBC WITH DIFFERENTIAL/PLATELET
BASOS PCT: 0 % (ref 0–1)
Basophils Absolute: 0 10*3/uL (ref 0.0–0.1)
Eosinophils Absolute: 0.1 10*3/uL (ref 0.0–0.7)
Eosinophils Relative: 1 % (ref 0–5)
HCT: 32.5 % — ABNORMAL LOW (ref 39.0–52.0)
HEMOGLOBIN: 10.7 g/dL — AB (ref 13.0–17.0)
LYMPHS PCT: 16 % (ref 12–46)
Lymphs Abs: 1.8 10*3/uL (ref 0.7–4.0)
MCH: 28.9 pg (ref 26.0–34.0)
MCHC: 32.9 g/dL (ref 30.0–36.0)
MCV: 87.8 fL (ref 78.0–100.0)
MONOS PCT: 8 % (ref 3–12)
Monocytes Absolute: 1 10*3/uL (ref 0.1–1.0)
NEUTROS ABS: 8.6 10*3/uL — AB (ref 1.7–7.7)
NEUTROS PCT: 75 % (ref 43–77)
Platelets: 439 10*3/uL — ABNORMAL HIGH (ref 150–400)
RBC: 3.7 MIL/uL — ABNORMAL LOW (ref 4.22–5.81)
RDW: 13.9 % (ref 11.5–15.5)
WBC: 11.5 10*3/uL — AB (ref 4.0–10.5)

## 2013-10-26 LAB — LACTIC ACID, PLASMA: Lactic Acid, Venous: 1.3 mmol/L (ref 0.5–2.2)

## 2013-10-26 LAB — TROPONIN I: Troponin I: <0.3 ng/mL (ref <0.30)

## 2013-10-26 LAB — CLOSTRIDIUM DIFFICILE BY PCR: Toxigenic C. Difficile by PCR: NEGATIVE

## 2013-10-26 LAB — PRO B NATRIURETIC PEPTIDE: Pro B Natriuretic peptide (BNP): 2729 pg/mL — ABNORMAL HIGH (ref 0–125)

## 2013-10-26 LAB — LIPASE, BLOOD: Lipase: 9 U/L — ABNORMAL LOW (ref 11–59)

## 2013-10-26 MED ORDER — ACETAMINOPHEN 325 MG PO TABS: 650.0000 mg | ORAL_TABLET | Freq: Four times a day (QID) | ORAL | Status: DC | PRN

## 2013-10-26 MED ORDER — METHYLPREDNISOLONE SODIUM SUCC 125 MG IJ SOLR
60.0000 mg | Freq: Two times a day (BID) | INTRAMUSCULAR | Status: DC
  Administered 2013-10-26 – 2013-10-28 (×4): 60 mg via INTRAVENOUS
  Filled 2013-10-26 (×6): qty 0.96

## 2013-10-26 MED ORDER — ONDANSETRON HCL 4 MG PO TABS: 4.0000 mg | ORAL_TABLET | Freq: Four times a day (QID) | ORAL | Status: DC | PRN

## 2013-10-26 MED ORDER — ONDANSETRON HCL 4 MG/2ML IJ SOLN: 4.0000 mg | Freq: Four times a day (QID) | INTRAMUSCULAR | Status: DC | PRN

## 2013-10-26 MED ORDER — SIMVASTATIN 40 MG PO TABS
40.0000 mg | ORAL_TABLET | Freq: Every evening | ORAL | Status: DC
  Administered 2013-10-26 – 2013-10-30 (×5): 40 mg via ORAL
  Filled 2013-10-26 (×6): qty 1

## 2013-10-26 MED ORDER — MORPHINE SULFATE 2 MG/ML IJ SOLN: 0.5000 mg | INTRAMUSCULAR | Status: DC | PRN

## 2013-10-26 MED ORDER — ENOXAPARIN SODIUM 30 MG/0.3ML ~~LOC~~ SOLN
30.0000 mg | SUBCUTANEOUS | Status: DC
  Administered 2013-10-26: 30 mg via SUBCUTANEOUS
  Filled 2013-10-26 (×3): qty 0.3

## 2013-10-26 MED ORDER — PANTOPRAZOLE SODIUM 40 MG PO TBEC
40.0000 mg | DELAYED_RELEASE_TABLET | Freq: Every day | ORAL | Status: DC
  Administered 2013-10-26 – 2013-10-28 (×3): 40 mg via ORAL
  Filled 2013-10-26 (×3): qty 1

## 2013-10-26 MED ORDER — ACETAMINOPHEN 650 MG RE SUPP: 650.0000 mg | Freq: Four times a day (QID) | RECTAL | Status: DC | PRN

## 2013-10-26 MED ORDER — FERROUS SULFATE 325 (65 FE) MG PO TABS
325.0000 mg | ORAL_TABLET | Freq: Two times a day (BID) | ORAL | Status: DC
  Administered 2013-10-26 – 2013-10-31 (×11): 325 mg via ORAL
  Filled 2013-10-26 (×12): qty 1

## 2013-10-26 MED ORDER — SODIUM CHLORIDE 0.9 % IV SOLN
INTRAVENOUS | Status: DC
  Administered 2013-10-26 – 2013-10-27 (×2): via INTRAVENOUS
  Administered 2013-10-27: 1000 mL via INTRAVENOUS

## 2013-10-26 NOTE — Progress Notes (Addendum)
INITIAL NUTRITION ASSESSMENT  Pt meets criteria for severe MALNUTRITION in the context of chronic illness as evidenced by <75% estimated energy intake with 8% weight loss in the past month per pt report.  DOCUMENTATION CODES Per approved criteria  -Severe malnutrition in the context of chronic illness   INTERVENTION: - Diet advancement per MD - Educated pt on diet therapy for ulcerative colitis and encouraged low fiber diet while pt having diarrhea. Detailed handouts provided with RD contact information - Recommend MD consider adding Florastor - Discouraged gluten free diet unless MD diagnoses pt with Celiac disease to prevent unneccesary nutrient restriction  - RD to continue to monitor   NUTRITION DIAGNOSIS: Altered GI function related to diarrhea as evidenced by MD notes.   Goal: 1. Resolution of diarrhea 2. Advance diet as tolerated to low fiber diet  Monitor:  Weights, labs, diet advancement, diarrhea   Reason for Assessment: Consult for assessment   68 y.o. male  Admitting Dx: Diarrhea, lower extremity swelling   ASSESSMENT: Pt was diagnosed with ulcerative colitis in December of 2014. Has been followed by [facility] for this issue. He had difficulty getting started on Asacol due to insurance purposes and ultimately required hospital admission in March 2015 secondary to renal insufficiency from prerenal azotemia related to diarrhea. He was discharged home after a good response on IV steroids. As an outpatient slow steroid taper began. Because of a rising creatinine, GI decided to discontinue Asacol last week as well as his diuretic. He apparently followed up with his PCP last Friday and they drew lab work. His PCP called him today to ask him to come to the emergency department due to a creatinine of 4.7 mg/dL. Patient presents to the hospital today where his creatinine is 2.39 mg/dL. However he does relate that ever since stopping the Asacol he is having about 30 bowel movements  a day, has become progressively weak. After discussion with Dr. Carlean Purl, we have decided to admit Mr. Sean Pope for purposes of controlling his colitis with steroids and some IV fluids to improve his intravascular volume state.  -Met with pt who reports for the past week his PO intake has decreased dramatically due to diarrhea.  -Due to working during the day and not wanting to have diarrhea at work, he would not eat/drink during the day. Would eat whatever he wanted to at dinnertime and then he would start having diarrhea at midnight which would occur every 30 minutes throughout the night. Sample dinner would be chicken noodle soup. Would have a fruit smoothie before breakfast.  -Has been trying to stay hydrated and drink a lot of Gatorade -Noticed recently that cold beverages burn his stomach and that smaller meals are better tolerated than large ones -States he's lost 14-15 pounds in the past month unintentionally -Started following gluten free diet recently as he read online it would help with his diarrhea but states he hasn't noticed any improvement -Has tried nutritional supplements in the past but states drinks like Ensure cause him to have diarrhea -Was on regular diet earlier today and pt consumed over 75% of cheese quesadilla   Sodium low BUN/Cr elevated with low GFR Lipase low, AST/ALT WNL   Nutrition Focused Physical Exam:  Subcutaneous Fat:  Orbital Region: WNL Upper Arm Region: mild muscle wasting Thoracic and Lumbar Region: WNL  Muscle:  Temple Region: WNL Clavicle Bone Region: WNL Clavicle and Acromion Bone Region: WNL Scapular Bone Region: NA Dorsal Hand: WNL Patellar Region: NA Anterior Thigh Region:  NA Posterior Calf Region: NA  Edema: Lower extremity swelling     Height: Ht Readings from Last 1 Encounters:  10/26/13 _0  (1.803 m)    Weight: Wt Readings from Last 1 Encounters:  10/26/13 159 lb (72.122 kg)    Ideal Body Weight: 172 lbs  % Ideal Body  Weight: 92%  Wt Readings from Last 10 Encounters:  10/26/13 159 lb (72.122 kg)  09/14/13 170 lb (77.111 kg)  09/05/13 162 lb 14.7 oz (73.9 kg)  07/15/13 170 lb 6.4 oz (77.293 kg)  05/28/13 172 lb (78.019 kg)  04/09/13 172 lb 12.8 oz (78.382 kg)  01/24/12 175 lb (79.379 kg)  10/01/11 178 lb 5.6 oz (80.9 kg)  10/01/11 178 lb 5.6 oz (80.9 kg)    Usual Body Weight: 173-174 lbs per pt  % Usual Body Weight: 91-92%  BMI:  Body mass index is 22.19 kg/(m^2).  Estimated Nutritional Needs: Kcal: 2376-2831 Protein: 85-105g Fluid: 1.9-2.2L/day   Skin: Intact, lower extremity swelling   Diet Order: Clear Liquid  EDUCATION NEEDS: -Education needs addressed - discussed diet therapy for ulcerative colitis, provided detailed handouts with RD contact information. Encouraged low fiber diet therapy until symptoms resolve.   No intake or output data in the 24 hours ending 10/26/13 1633  Last BM: PTA  Labs:   Recent Labs Lab 10/20/13 0855 10/26/13 0930  NA 138 135*  K 4.5 4.9  CL 108 98  CO2 23 30  BUN 37* 29*  CREATININE 4.7* 2.39*  CALCIUM 8.7  8.7 12.1*  PHOS 4.0  --   GLUCOSE 96 99    CBG (last 3)  No results found for this basename: GLUCAP,  in the last 72 hours  Scheduled Meds: . enoxaparin (LOVENOX) injection  30 mg Subcutaneous Q24H  . ferrous sulfate  325 mg Oral BID  . methylPREDNISolone (SOLU-MEDROL) injection  60 mg Intravenous Q12H  . pantoprazole  40 mg Oral Daily  . simvastatin  40 mg Oral QPM    Continuous Infusions: . sodium chloride 75 mL/hr at 10/26/13 1429    Past Medical History  Diagnosis Date  . Stroke   . SAH (subarachnoid hemorrhage)   . Hypertension   . Hyperlipidemia   . Lung collapse 09/26/2011  . Acute respiratory failure with hypoxia 09/17/2011  . 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 Ulcerative colitis 05/28/2013    Past Surgical History  Procedure Laterality Date  . Aneurysm coiling    . Colonoscopy w/ biopsies   05/28/2013    Mikey College MS, [place], [place] Pager [phone] After Hours Pager

## 2013-10-26 NOTE — ED Notes (Signed)
Patient states that he was diagnosed with colitis 05/2013 and had medication for the same. Patient states he was taken off of his medication 3 weeks ago due to decreased renal function. Patient states he has had continuous diarrhea since. Patient denies any blood in his stool.

## 2013-10-26 NOTE — ED Notes (Signed)
Patient is unable to urinate but will try.

## 2013-10-26 NOTE — Consult Note (Addendum)
Consultation  Referring Provider:  [facility]    Primary Care Physician:  Tawanna Solo, MD Primary Gastroenterologist:  Silvano Rusk, MD       Reason for Consultation:  Ulcerative colitis          HPI:   Sean Pope is a 68 y.o. male known to [place] for ulcerative colitis diagnosed December 2014. Patient had some difficulty getting started on Asacol (insurance / cost) and ended up being hospitalized in March wiith hypotension / renal insufficiency from dehydration related to diarrhea (with blood). He was discharged home on prednisone after a good response to IVF and IV steroids. His BP meds were not resumed at discharge. Patient was still doing well at time of hospital follow up late March. BMs were down to 2 /day, bleeding had resolved. Patient did complain of lower extremitiy edema. We began tapering his steroids, increased Asacol dose ,resumed Hyzaar and gave him daily lasix to take for 4 days. Prednisone was down to 61m daily and patient was continuing to do fairly well on Prednisone and Asacol. On 10/16/12 labs revealed a rise in creatinine from 1.4 to 3.3. Asacol stopped. Repeat creatinine 5/5 revealed futher rise in creatinine to 4.7. Since stopping Asacol patient has had severe diarrhea associated with urgency and tenesmus. No bleeding. No abdominal pain. Patient presented to ED today for evaluation. Creatinine down to 2.39. Sodium slightly low, K+ okay. Albumin 2.0. WBC 11.5. His BNT is elevated at 2729  Past Medical History  Diagnosis Date  . Stroke   . SAH (subarachnoid hemorrhage)   . Hypertension   . Hyperlipidemia   . Lung collapse 09/26/2011  . Acute respiratory failure with hypoxia 09/17/2011  . 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 Ulcerative colitis 05/28/2013    Past Surgical History  Procedure Laterality Date  . Aneurysm coiling    . Colonoscopy w/ biopsies  05/28/2013    Family History  Problem Relation Age of Onset  . Heart disease Mother        History  Substance Use Topics  . Smoking status: Former Smoker -- 0.50 packs/day for 50 years    Types: Cigarettes  . Smokeless tobacco: Never Used  . Alcohol Use: Yes     Comment: rare    Prior to Admission medications   Medication Sig Start Date End Date Taking? Authorizing Provider  calcium carbonate (TUMS - DOSED IN MG ELEMENTAL CALCIUM) 500 MG chewable tablet Chew 1 tablet (200 mg of elemental calcium total) by mouth 3 (three) times daily as needed for indigestion or heartburn. 09/08/13  Yes Nishant Dhungel, MD  Ferrous Sulfate (IRON) 325 (65 FE) MG TABS Take 1 tablet by mouth 2 (two) times daily.    Yes Historical Provider, MD  Loperamide HCl (IMODIUM A-D) 1 MG/7.5ML LIQD Take 30 mLs by mouth as needed.   Yes Historical Provider, MD  Mesalamine (ASACOL HD) 800 MG TBEC Take 2 tab 3 times daily 09/14/13  Yes Amy S Esterwood, PA-C  Multiple Vitamins-Minerals (MENS MULTIVITAMIN PLUS PO) Take 1 tablet by mouth.   Yes Historical Provider, MD  simvastatin (ZOCOR) 40 MG tablet Take 40 mg by mouth every evening.   Yes Historical Provider, MD    Current Facility-Administered Medications  Medication Dose Route Frequency Provider Last Rate Last Dose  . 0.9 %  sodium chloride infusion   Intravenous Continuous EErline Hau MD      . acetaminophen (TYLENOL) tablet 650 mg  650 mg Oral Q6H  PRN Erline Hau, MD       Or  . acetaminophen (TYLENOL) suppository 650 mg  650 mg Rectal Q6H PRN Erline Hau, MD      . enoxaparin (LOVENOX) injection 30 mg  30 mg Subcutaneous Q24H Estela Leonie Green, MD      . Iron TABS 1 tablet  1 tablet Oral BID Erline Hau, MD      . methylPREDNISolone sodium succinate (SOLU-MEDROL) 125 mg/2 mL injection 60 mg  60 mg Intravenous Q12H Estela Leonie Green, MD      . morphine 2 MG/ML injection 0.5 mg  0.5 mg Intravenous Q4H PRN Erline Hau, MD      . ondansetron [facility]) tablet 4 mg  4 mg  Oral Q6H PRN Erline Hau, MD       Or  . ondansetron [facility]) injection 4 mg  4 mg Intravenous Q6H PRN Erline Hau, MD      . pantoprazole (PROTONIX) EC tablet 40 mg  40 mg Oral Daily Erline Hau, MD      . simvastatin (ZOCOR) tablet 40 mg  40 mg Oral QPM Estela Leonie Green, MD       Current Outpatient Prescriptions  Medication Sig Dispense Refill  . calcium carbonate (TUMS - DOSED IN MG ELEMENTAL CALCIUM) 500 MG chewable tablet Chew 1 tablet (200 mg of elemental calcium total) by mouth 3 (three) times daily as needed for indigestion or heartburn.  30 tablet  0  . Ferrous Sulfate (IRON) 325 (65 FE) MG TABS Take 1 tablet by mouth 2 (two) times daily.       . Loperamide HCl (IMODIUM A-D) 1 MG/7.5ML LIQD Take 30 mLs by mouth as needed.      . Mesalamine (ASACOL HD) 800 MG TBEC Take 2 tab 3 times daily  180 tablet  6  . Multiple Vitamins-Minerals (MENS MULTIVITAMIN PLUS PO) Take 1 tablet by mouth.      . simvastatin (ZOCOR) 40 MG tablet Take 40 mg by mouth every evening.        Allergies as of 10/26/2013 - Review Complete 10/26/2013  Allergen Reaction Noted  . No known allergies  10/26/2013   Review of Systems:    All systems reviewed and negative except where noted in HPI.   Physical Exam:  Vital signs in last 24 hours: Temp:  [98.4 F (36.9 C)] 98.4 F (36.9 C) (05/11 0909) Pulse Rate:  [67-79] 69 (05/11 1230) Resp:  [13-20] 13 (05/11 1230) BP: (125-160)/(77-87) 150/85 mmHg (05/11 1230) SpO2:  [86 %-100 %] 100 % (05/11 1230) Weight:  [159 lb (72.122 kg)] 159 lb (72.122 kg) (05/11 0907)   General:   Pleasant white ale in NAD Head:  Normocephalic and atraumatic. Eyes:   No icterus.   Conjunctiva pink. Ears:  Normal auditory acuity. Neck:  Supple; no masses felt Lungs:  Respirations even and unlabored. Lungs clear to auscultation bilaterally.   No wheezes, crackles, or rhonchi.  Heart:  Regular rate and rhythm; Abdomen:  Soft,  nondistended, nontender. Normal bowel sounds. No appreciable masses or hepatomegaly.  Rectal:  Not performed.  Msk:  Symmetrical without gross deformities.  Extremities:  1-2+ BLE edema.  Neurologic:  Alert and  oriented x4;  grossly normal neurologically. Skin:  Intact without significant lesions or rashes. Cervical Nodes:  No significant cervical adenopathy. Psych:  Alert and cooperative. Normal affect.  LAB RESULTS:  Recent Labs  10/26/13 0930  WBC 11.5*  HGB 10.7*  HCT 32.5*  PLT 439*   BMET  Recent Labs  10/26/13 0930  NA 135*  K 4.9  CL 98  CO2 30  GLUCOSE 99  BUN 29*  CREATININE 2.39*  CALCIUM 12.1*   LFT  Recent Labs  10/26/13 0930  PROT 5.4*  ALBUMIN 2.0*  AST 14  ALT 12  ALKPHOS 75  BILITOT 0.3    STUDIES: Dg Abd Acute W/chest  10/26/2013   CLINICAL DATA:  Diarrhea, upper abdominal pain  EXAM: ACUTE ABDOMEN SERIES (ABDOMEN 2 VIEW & CHEST 1 VIEW)  COMPARISON:  CT ABD/PELV WO CM dated 09/05/2013  FINDINGS: There is no evidence of dilated bowel loops or free intraperitoneal air. No radiopaque calculi or other significant radiographic abnormality is seen. Heart size and mediastinal contours are within normal limits. Both lungs are clear. Pulmonary hyperinflation may suggest emphysema.  IMPRESSION: Negative abdominal radiographs.  No acute cardiopulmonary disease.   Electronically Signed   By: Conchita Paris M.D.   On: 10/26/2013 09:29    PREVIOUS ENDOSCOPIES:            Colonoscopy December 2014- pancolitis.  Pathology:  MILDLY ACTIVE CHRONIC COLITIS. SEE COMMENT. - NEGATIVE FOR DYSPLASIA. Surgical [P], left sided colon, rectum, biopsy - MILDLY ACTIVE CHRONIC COLITIS,   Impression / Plan:   18. 68 year old male with universal colitis diagnosed December 2014. He was somewhat controlled ("bowel movements were at least predictable") on Asacol and Prednisone. Asacol had to be stopped a couple of weeks ago secondary to renal insufficiency. Creatinine  improved, but not back to baseline. Since completing prednisone taper and abruptly stopping Asacol patient's diarrhea has significantly increased and he is having tenesmus / urgency. Need to exclude superimposed C-diff and probably CMV as well. If both negative then it is probably time to escalate UC therapy. For now will start solumedrol and await stool for c-diff. Bowel rest for now, just sips  2. AKI. This may actually be multifactorial ( Hyzaar, lasix, volume depletion from diarrhea, and possibly increased dose of Asacol). Creatinine is improving.   3. Hypercalcemia, probably related to intravascular volume depletion. Will recheck in am  4. Lower extremity edema, multifactorial (steroids, low albumin).   Thanks   LOS: 0 days   Willia Craze  10/26/2013, 1:30 PM   ________________________________________________________________________  [facility] MD note:  I reviewed the data, personally met with patient and agree with the assessment and plan described above. Would not proceed with flex sig at this point but that will be considers if he does not respond to steroids as expected.  Goal will be to transition him to immunomodulators since mesalamine products may be contributing to his renal failure (is known, uncommon, adverse reaction)   Owens Loffler, MD [facility] Pager [phone]

## 2013-10-26 NOTE — ED Notes (Signed)
Patient said he tried to urinate and was unable.

## 2013-10-26 NOTE — H&P (Signed)
[facility]          History and Physical    PCP:   Tawanna Solo, MD   Chief Complaint:  Diarrhea, abnormal lab, lower extremity swelling  HPI: Patient is a pleasant 68 year old man who was diagnosed with ulcerative colitis in December of 2014. Has been followed by [facility] for this issue. He had difficulty getting started on Asacol due to insurance purposes and ultimately required hospital admission in March 2015 secondary to renal insufficiency from prerenal azotemia related to diarrhea. He was discharged home after a good response on IV steroids. As an outpatient slow steroid taper began. Because of a rising creatinine, GI decided to discontinue Asacol last week as well as his ARB and diuretic. He apparently followed up with his PCP last Friday and they drew lab work. His PCP called him today to ask him to come to the emergency department due to a creatinine of 4.7. Patient presents to the hospital today where his creatinine is 2.39. However he does relate that ever since stopping the Asacol he is having about 30 bowel movements a day, has become progressively weak. After discussion with Dr. Carlean Purl, we have decided to admit Mr. Sean Pope for purposes of controlling his colitis with steroids and some IV fluids to improve his intravascular volume state.  Allergies:   Allergies  Allergen Reactions  . No Known Allergies       Past Medical History  Diagnosis Date  . Stroke   . SAH (subarachnoid hemorrhage)   . Hypertension   . Hyperlipidemia   . Lung collapse 09/26/2011  . Acute respiratory failure with hypoxia 09/17/2011  . 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 Ulcerative colitis 05/28/2013    Past Surgical History  Procedure Laterality Date  . Aneurysm coiling    . Colonoscopy w/ biopsies  05/28/2013    Prior to Admission medications   Medication Sig Start Date End Date Taking? Authorizing Provider  calcium carbonate (TUMS - DOSED IN MG  ELEMENTAL CALCIUM) 500 MG chewable tablet Chew 1 tablet (200 mg of elemental calcium total) by mouth 3 (three) times daily as needed for indigestion or heartburn. 09/08/13  Yes Nishant Dhungel, MD  Ferrous Sulfate (IRON) 325 (65 FE) MG TABS Take 1 tablet by mouth 2 (two) times daily.    Yes Historical Provider, MD  Loperamide HCl (IMODIUM A-D) 1 MG/7.5ML LIQD Take 30 mLs by mouth as needed.   Yes Historical Provider, MD  Mesalamine (ASACOL HD) 800 MG TBEC Take 2 tab 3 times daily 09/14/13  Yes Amy S Esterwood, PA-C  Multiple Vitamins-Minerals (MENS MULTIVITAMIN PLUS PO) Take 1 tablet by mouth.   Yes Historical Provider, MD  simvastatin (ZOCOR) 40 MG tablet Take 40 mg by mouth every evening.   Yes Historical Provider, MD    Social History:  reports that he has quit smoking. His smoking use included Cigarettes. He has a 25 pack-year smoking history. He has never used smokeless tobacco. He reports that he drinks alcohol. He reports that he does not use illicit drugs.  Family History  Problem Relation Age of Onset  . Heart disease Mother     Review of Systems:  Constitutional: Denies fever, chills, diaphoresis.  HEENT: Denies photophobia, eye pain, redness, hearing loss, ear pain, congestion, sore throat, rhinorrhea, sneezing, mouth sores, trouble swallowing, neck pain, neck stiffness and tinnitus.   Respiratory: Denies SOB, DOE, cough, chest tightness,  and wheezing.   Cardiovascular: Denies chest pain, palpitations and leg swelling.  Gastrointestinal: Denies nausea, vomiting, abdominal pain,  constipation and abdominal distention.  Genitourinary: Denies dysuria, urgency, frequency, hematuria, flank pain and difficulty urinating.  Endocrine: Denies: hot or cold intolerance, sweats, changes in hair or nails, polyuria, polydipsia. Musculoskeletal: Denies myalgias, back pain, joint swelling, arthralgias and gait problem.  Skin: Denies pallor, rash and wound.  Neurological: Denies dizziness,  seizures, syncope, weakness, light-headedness, numbness and headaches.  Hematological: Denies adenopathy. Easy bruising, personal or family bleeding history  Psychiatric/Behavioral: Denies suicidal ideation, mood changes, confusion, nervousness, sleep disturbance and agitation   Physical Exam: Blood pressure 141/69, pulse 75, temperature 97.6 F (36.4 C), temperature source Oral, resp. rate 16, height _0  (1.803 m), weight 72.122 kg (159 lb), SpO2 100.00%. General: Alert, awake, oriented x3, very pleasant and cooperative with exam, appears somewhat cachectic with cheek and temporal wasting. HEENT: Normocephalic, atraumatic, pupils equal round and reactive to light, extraocular movements intact, dry mucous membranes. Neck: Supple, no JVD, no lymphadenopathy, no bruits, no goiter. Cardiovascular: Regular rate and rhythm, no murmurs, rubs or gallops auscultated. Lungs: Clear to auscultation bilaterally. Abdomen: Soft, nontender to palpation, nondistended, positive bowel sounds, no masses or organomegaly noted. Extremities: 2+ pitting edema bilaterally up to knees. Neurologic: Grossly intact and nonfocal.  Labs on Admission:  Results for orders placed during the hospital encounter of 10/26/13 (from the past 48 hour(s))  CBC WITH DIFFERENTIAL     Status: Abnormal   Collection Time    10/26/13  9:30 AM      Result Value Ref Range   WBC 11.5 (*) 4.0 - 10.5 K/uL   RBC 3.70 (*) 4.22 - 5.81 MIL/uL   Hemoglobin 10.7 (*) 13.0 - 17.0 g/dL   HCT 32.5 (*) 39.0 - 52.0 %   MCV 87.8  78.0 - 100.0 fL   MCH 28.9  26.0 - 34.0 pg   MCHC 32.9  30.0 - 36.0 g/dL   RDW 13.9  11.5 - 15.5 %   Platelets 439 (*) 150 - 400 K/uL   Neutrophils Relative % 75  43 - 77 %   Neutro Abs 8.6 (*) 1.7 - 7.7 K/uL   Lymphocytes Relative 16  12 - 46 %   Lymphs Abs 1.8  0.7 - 4.0 K/uL   Monocytes Relative 8  3 - 12 %   Monocytes Absolute 1.0  0.1 - 1.0 K/uL   Eosinophils Relative 1  0 - 5 %   Eosinophils Absolute 0.1   0.0 - 0.7 K/uL   Basophils Relative 0  0 - 1 %   Basophils Absolute 0.0  0.0 - 0.1 K/uL  COMPREHENSIVE METABOLIC PANEL     Status: Abnormal   Collection Time    10/26/13  9:30 AM      Result Value Ref Range   Sodium 135 (*) 137 - 147 mEq/L   Potassium 4.9  3.7 - 5.3 mEq/L   Chloride 98  96 - 112 mEq/L   CO2 30  19 - 32 mEq/L   Glucose, Bld 99  70 - 99 mg/dL   BUN 29 (*) 6 - 23 mg/dL   Creatinine, Ser 2.39 (*) 0.50 - 1.35 mg/dL   Calcium 12.1 (*) 8.4 - 10.5 mg/dL   Total Protein 5.4 (*) 6.0 - 8.3 g/dL   Albumin 2.0 (*) 3.5 - 5.2 g/dL   AST 14  0 - 37 U/L   ALT 12  0 - 53 U/L  Alkaline Phosphatase 75  39 - 117 U/L   Total Bilirubin 0.3  0.3 - 1.2 mg/dL   GFR calc non Af Amer 26 (*) >90 mL/min   GFR calc Af Amer 30 (*) >90 mL/min   Comment: (NOTE)     The eGFR has been calculated using the CKD EPI equation.     This calculation has not been validated in all clinical situations.     eGFR's persistently <90 mL/min signify possible Chronic Kidney     Disease.  LIPASE, BLOOD     Status: Abnormal   Collection Time    10/26/13  9:30 AM      Result Value Ref Range   Lipase 9 (*) 11 - 59 U/L  LACTIC ACID, PLASMA     Status: None   Collection Time    10/26/13  9:30 AM      Result Value Ref Range   Lactic Acid, Venous 1.3  0.5 - 2.2 mmol/L  TROPONIN I     Status: None   Collection Time    10/26/13  9:30 AM      Result Value Ref Range   Troponin I <0.30  <0.30 ng/mL   Comment:            Due to the release kinetics of cTnI,     a negative result within the first hours     of the onset of symptoms does not rule out     myocardial infarction with certainty.     If myocardial infarction is still suspected,     repeat the test at appropriate intervals.  PRO B NATRIURETIC PEPTIDE     Status: Abnormal   Collection Time    10/26/13  9:30 AM      Result Value Ref Range   Pro B Natriuretic peptide (BNP) 2729.0 (*) 0 - 125 pg/mL  URINALYSIS, ROUTINE W REFLEX MICROSCOPIC     Status:  None   Collection Time    10/26/13 10:55 AM      Result Value Ref Range   Color, Urine YELLOW  YELLOW   APPearance CLEAR  CLEAR   Specific Gravity, Urine 1.005  1.005 - 1.030   pH 6.0  5.0 - 8.0   Glucose, UA NEGATIVE  NEGATIVE mg/dL   Hgb urine dipstick NEGATIVE  NEGATIVE   Bilirubin Urine NEGATIVE  NEGATIVE   Ketones, ur NEGATIVE  NEGATIVE mg/dL   Protein, ur NEGATIVE  NEGATIVE mg/dL   Urobilinogen, UA 0.2  0.0 - 1.0 mg/dL   Nitrite NEGATIVE  NEGATIVE   Leukocytes, UA NEGATIVE  NEGATIVE   Comment: MICROSCOPIC NOT DONE ON URINES WITH NEGATIVE PROTEIN, BLOOD, LEUKOCYTES, NITRITE, OR GLUCOSE <1000 mg/dL.    Radiological Exams on Admission: Dg Abd Acute W/chest  10/26/2013   CLINICAL DATA:  Diarrhea, upper abdominal pain  EXAM: ACUTE ABDOMEN SERIES (ABDOMEN 2 VIEW & CHEST 1 VIEW)  COMPARISON:  CT ABD/PELV WO CM dated 09/05/2013  FINDINGS: There is no evidence of dilated bowel loops or free intraperitoneal air. No radiopaque calculi or other significant radiographic abnormality is seen. Heart size and mediastinal contours are within normal limits. Both lungs are clear. Pulmonary hyperinflation may suggest emphysema.  IMPRESSION: Negative abdominal radiographs.  No acute cardiopulmonary disease.   Electronically Signed   By: Conchita Paris M.D.   On: 10/26/2013 09:29    Assessment/Plan Active Problems:   Ulcerative chronic pancolitis   Diarrhea   ARF (acute renal failure)   GERD (gastroesophageal reflux  disease)   UC (ulcerative colitis)   Protein-calorie malnutrition, severe   Hypercalcemia    Chronic diarrhea -Ongoing for at least 6 months, related to ulcerative colitis. -As discussed with Dr. Carlean Purl via phone, will start on IV Solu-Medrol, fluids. -As a precaution we'll check a C. difficile PCR as well as a GI stool pathogen panel to rule out acute infection. -GI to see in consultation for recommendations on further treatment for his ulcerative colitis.  Acute renal  failure -Creatinine today is 3.25 which is certainly improved from 4.7 3 days ago. -The lowest creatinine that I can document recently in our chart system is 1.05 in August of 2013, so he likely has some degree of chronic kidney disease. -Suspect this is all related to prerenal azotemia due to severe diarrhea/ATN/possible nephrotoxic effect of mesalamine. -Give some IV fluids and monitor creatinine. -Doubt he will need an inpatient renal consultation unless his creatinine were to trend up.  Severe protein caloric malnutrition -Secondary to chronic diarrhea. -Albumin is 2.0 which is probably contributing to his lower extremity edema. -Nutrition consultation for further diet recommendations.  Hypercalcemia -Calcium is 12.1 which corrected for his albumin is 13.7.  -likely related to intravascular volume depletion. -Start fluids and recheck calcium level in the morning.   Lower extremity edema -Most likely secondary to low oncotic pressure from decreased albumin and severe malnutrition. -Would not prescribe Lasix for this issue, especially since he is intravascularly volume depleted from diarrhea. -Is not showing any evidence for pulmonary edema.  DVT prophylaxis -Lovenox.  CODE STATUS -Full code as discussed with patient and wife at bedside.   Time Spent on Admission: 90 minutes  [facility] Pager: [phone] 10/26/2013, 4:10 PM

## 2013-10-26 NOTE — ED Provider Notes (Signed)
CSN: [id number]     Arrival date & time 10/26/13  7619 History   First MD Initiated Contact with Patient 10/26/13 [phone]     Chief Complaint  Patient presents with  . Diarrhea  . Abnormal Lab  . Edema      HPI Pt was seen at 0855. Per pt and his wife, c/o gradual onset and worsening of persistent peripheral edema for the past several months, worse over the past 2 weeks. Pt also continues to c/o multiple daily episodes of diarrhea for the past 6 months. Pt states he has been evaluated by his GI MD and PMD for same, dx colitis. Pt states his GI MD "took me off all my meds" approx 2 weeks ago "to see if that would help out with the swelling." Pt states "all that did was make me have more episodes of diarrhea every day." States he was called by his PMD today and told his "renal function tests were high" on his labs completed 3 days ago. Pt denies black or blood in stools, no N/V, no abd pain, no back/flank pain, no SOB/cough, no CP/palpitations, no fevers, no unilateral calf/LE swelling, no rash.     GI: Carlean Purl Past Medical History  Diagnosis Date  . Stroke   . SAH (subarachnoid hemorrhage)   . Hypertension   . Hyperlipidemia   . Lung collapse 09/26/2011  . Acute respiratory failure with hypoxia 09/17/2011  . 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 Ulcerative colitis 05/28/2013   Past Surgical History  Procedure Laterality Date  . Aneurysm coiling    . Colonoscopy w/ biopsies  05/28/2013   Family History  Problem Relation Age of Onset  . Heart disease Mother    History  Substance Use Topics  . Smoking status: Former Smoker -- 0.50 packs/day for 50 years    Types: Cigarettes  . Smokeless tobacco: Never Used  . Alcohol Use: Yes     Comment: rare    Review of Systems ROS: Statement: All systems negative except as marked or noted in the HPI; Constitutional: Negative for fever and chills. ; ; Eyes: Negative for eye pain, redness and discharge. ; ; ENMT: Negative for ear  pain, hoarseness, nasal congestion, sinus pressure and sore throat. ; ; Cardiovascular: Negative for chest pain, palpitations, diaphoresis, dyspnea and +peripheral edema. ; ; Respiratory: Negative for cough, wheezing and stridor. ; ; Gastrointestinal: +diarrhea. Negative for nausea, vomiting, abdominal pain, blood in stool, hematemesis, jaundice and rectal bleeding. . ; ; Genitourinary: Negative for dysuria, flank pain and hematuria. ; ; Musculoskeletal: Negative for back pain and neck pain. Negative for swelling and trauma.; ; Skin: Negative for pruritus, rash, abrasions, blisters, bruising and skin lesion.; ; Neuro: Negative for headache, lightheadedness and neck stiffness. Negative for weakness, altered level of consciousness , altered mental status, extremity weakness, paresthesias, involuntary movement, seizure and syncope.      Allergies  Review of patient's allergies indicates no known allergies.  Home Medications   Prior to Admission medications   Medication Sig Start Date End Date Taking? Authorizing Provider  calcium carbonate (TUMS - DOSED IN MG ELEMENTAL CALCIUM) 500 MG chewable tablet Chew 1 tablet (200 mg of elemental calcium total) by mouth 3 (three) times daily as needed for indigestion or heartburn. 09/08/13   Nishant Dhungel, MD  Ferrous Sulfate (IRON) 325 (65 FE) MG TABS Take 1 tablet by mouth daily.    Historical Provider, MD  furosemide (LASIX) 20 MG tablet Take one daily in AM  x 4 days 09/16/13   Amy S Esterwood, PA-C  Mesalamine (ASACOL HD) 800 MG TBEC Take 2 tab 3 times daily 09/14/13   Amy S Esterwood, PA-C  predniSONE (DELTASONE) 20 MG tablet Take Prednisone 30 mg each am 7 days. Take Prednisone 20 mg each am for 14 days. Take Predisone 15 mg until you see the Provider. 09/14/13   Amy S Esterwood, PA-C  simvastatin (ZOCOR) 40 MG tablet Take 40 mg by mouth every evening.    Historical Provider, MD   BP 125/77  Pulse 79  Temp(Src) 98.4 F (36.9 C) (Oral)  Resp 18  Ht 5'  11" (1.803 m)  Wt 159 lb (72.122 kg)  BMI 22.19 kg/m2  SpO2 99% Physical Exam 0900: Physical examination:  Nursing notes reviewed; Vital signs and O2 SAT reviewed;  Constitutional: Well developed, Well nourished, Well hydrated, In no acute distress; Head:  Normocephalic, atraumatic; Eyes: EOMI, PERRL, No scleral icterus; ENMT: Mouth and pharynx normal, Mucous membranes moist; Neck: Supple, Full range of motion, No lymphadenopathy; Cardiovascular: Regular rate and rhythm, No gallop; Respiratory: Breath sounds clear & equal bilaterally, No rales, rhonchi, wheezes.  Speaking full sentences with ease, Normal respiratory effort/excursion; Chest: Nontender, Movement normal; Abdomen: Soft, Nontender, Nondistended, Normal bowel sounds; Genitourinary: No CVA tenderness; Extremities: Pulses normal, No tenderness, +2 pedal edema bilat without calf asymmetry.; Neuro: AA&Ox3, Major CN grossly intact.  Speech clear. No gross focal motor or sensory deficits in extremities. Climbs on and off stretcher easily by himself. Gait steady.; Skin: Color normal, Warm, Dry.   ED Course  Procedures     EKG Interpretation None      MDM  MDM Reviewed: previous chart, nursing note and vitals Reviewed previous: labs and ECG Interpretation: labs, ECG and x-ray    Results for orders placed during the hospital encounter of 10/26/13  CBC WITH DIFFERENTIAL      Result Value Ref Range   WBC 11.5 (*) 4.0 - 10.5 K/uL   RBC 3.70 (*) 4.22 - 5.81 MIL/uL   Hemoglobin 10.7 (*) 13.0 - 17.0 g/dL   HCT 32.5 (*) 39.0 - 52.0 %   MCV 87.8  78.0 - 100.0 fL   MCH 28.9  26.0 - 34.0 pg   MCHC 32.9  30.0 - 36.0 g/dL   RDW 13.9  11.5 - 15.5 %   Platelets 439 (*) 150 - 400 K/uL   Neutrophils Relative % 75  43 - 77 %   Neutro Abs 8.6 (*) 1.7 - 7.7 K/uL   Lymphocytes Relative 16  12 - 46 %   Lymphs Abs 1.8  0.7 - 4.0 K/uL   Monocytes Relative 8  3 - 12 %   Monocytes Absolute 1.0  0.1 - 1.0 K/uL   Eosinophils Relative 1  0 - 5 %    Eosinophils Absolute 0.1  0.0 - 0.7 K/uL   Basophils Relative 0  0 - 1 %   Basophils Absolute 0.0  0.0 - 0.1 K/uL  COMPREHENSIVE METABOLIC PANEL      Result Value Ref Range   Sodium 135 (*) 137 - 147 mEq/L   Potassium 4.9  3.7 - 5.3 mEq/L   Chloride 98  96 - 112 mEq/L   CO2 30  19 - 32 mEq/L   Glucose, Bld 99  70 - 99 mg/dL   BUN 29 (*) 6 - 23 mg/dL   Creatinine, Ser 2.39 (*) 0.50 - 1.35 mg/dL   Calcium 12.1 (*) 8.4 - 10.5 mg/dL  Total Protein 5.4 (*) 6.0 - 8.3 g/dL   Albumin 2.0 (*) 3.5 - 5.2 g/dL   AST 14  0 - 37 U/L   ALT 12  0 - 53 U/L   Alkaline Phosphatase 75  39 - 117 U/L   Total Bilirubin 0.3  0.3 - 1.2 mg/dL   GFR calc non Af Amer 26 (*) >90 mL/min   GFR calc Af Amer 30 (*) >90 mL/min  LIPASE, BLOOD      Result Value Ref Range   Lipase 9 (*) 11 - 59 U/L  LACTIC ACID, PLASMA      Result Value Ref Range   Lactic Acid, Venous 1.3  0.5 - 2.2 mmol/L  TROPONIN I      Result Value Ref Range   Troponin I <0.30  <0.30 ng/mL  PRO B NATRIURETIC PEPTIDE      Result Value Ref Range   Pro B Natriuretic peptide (BNP) 2729.0 (*) 0 - 125 pg/mL   Dg Abd Acute W/chest 10/26/2013   CLINICAL DATA:  Diarrhea, upper abdominal pain  EXAM: ACUTE ABDOMEN SERIES (ABDOMEN 2 VIEW & CHEST 1 VIEW)  COMPARISON:  CT ABD/PELV WO CM dated 09/05/2013  FINDINGS: There is no evidence of dilated bowel loops or free intraperitoneal air. No radiopaque calculi or other significant radiographic abnormality is seen. Heart size and mediastinal contours are within normal limits. Both lungs are clear. Pulmonary hyperinflation may suggest emphysema.  IMPRESSION: Negative abdominal radiographs.  No acute cardiopulmonary disease.   Electronically Signed   By: Conchita Paris M.D.   On: 10/26/2013 09:29    1100:  Calcium elevated. BUN/Cr elevated today, but trending down from previous. BNP elevated, no acute CHF on CXR. No Echocardiogram on file. Pt continues to deny CP/SOB. T/C to [facility] Dr. Jerilee Hoh, case discussed,  including:  HPI, pertinent PM/SHx, VS/PE, dx testing, ED course and treatment:  Agreeable to come to ED for evaluation.   1300:  [facility] Dr. Jerilee Hoh, has evaluated pt in the ED and spoken with GI MD, he requests admission for pt's increasing diarrhea; she will admit, requests to write temporary orders, obtain medical bed to team 8.   Alfonzo Feller, DO 10/27/13 1443

## 2013-10-27 LAB — URINE CULTURE: Colony Count: 5000

## 2013-10-27 LAB — COMPREHENSIVE METABOLIC PANEL
ALT: 11 U/L (ref 0–53)
AST: 12 U/L (ref 0–37)
Albumin: 1.8 g/dL — ABNORMAL LOW (ref 3.5–5.2)
Alkaline Phosphatase: 67 U/L (ref 39–117)
BILIRUBIN TOTAL: 0.2 mg/dL — AB (ref 0.3–1.2)
BUN: 33 mg/dL — AB (ref 6–23)
CHLORIDE: 99 meq/L (ref 96–112)
CO2: 26 mEq/L (ref 19–32)
Calcium: 10.4 mg/dL (ref 8.4–10.5)
Creatinine, Ser: 2.43 mg/dL — ABNORMAL HIGH (ref 0.50–1.35)
GFR calc Af Amer: 30 mL/min — ABNORMAL LOW (ref >90)
GFR, EST NON AFRICAN AMERICAN: 26 mL/min — AB (ref >90)
Glucose, Bld: 121 mg/dL — ABNORMAL HIGH (ref 70–99)
Potassium: 5.1 mEq/L (ref 3.7–5.3)
Sodium: 133 mEq/L — ABNORMAL LOW (ref 137–147)
Total Protein: 4.5 g/dL — ABNORMAL LOW (ref 6.0–8.3)

## 2013-10-27 LAB — CBC
HEMATOCRIT: 27.6 % — AB (ref 39.0–52.0)
Hemoglobin: 9 g/dL — ABNORMAL LOW (ref 13.0–17.0)
MCH: 28.8 pg (ref 26.0–34.0)
MCHC: 32.6 g/dL (ref 30.0–36.0)
MCV: 88.2 fL (ref 78.0–100.0)
Platelets: 364 10*3/uL (ref 150–400)
RBC: 3.13 MIL/uL — ABNORMAL LOW (ref 4.22–5.81)
RDW: 14 % (ref 11.5–15.5)
WBC: 11.7 10*3/uL — AB (ref 4.0–10.5)

## 2013-10-27 NOTE — Care Management Note (Signed)
CARE MANAGEMENT NOTE 10/27/2013  Patient:  Sean Pope, Sean Pope   Account Number:  [id number]  Date Initiated:  10/27/2013  Documentation initiated by:  Alejandrina Raimer  Subjective/Objective Assessment:   68 yo male admitted with chronic diarrhea and renal failure.     Action/Plan:   Home when stable   Anticipated DC Date:     Anticipated DC Plan:  [name]  CM consult      Choice offered to / List presented to:  NA   DME arranged  NA      DME agency  NA     [place] arranged  NA      Jud agency  NA   Status of service:  In process, will continue to follow Medicare Important Message given?   (If response is "NO", the following Medicare IM given date fields will be blank) Date Medicare IM given:   Date Additional Medicare IM given:    Discharge Disposition:    Per UR Regulation:  Reviewed for med. necessity/level of care/duration of stay  If discussed at [place] of Stay Meetings, dates discussed:    Comments:  10/27/13 [place] [phone] Chart reviewed for utilization of services. BVP:[id number],UFCZ LYNN, MD. Identified pharmacy. No needs assessed at this.

## 2013-10-27 NOTE — Progress Notes (Signed)
[facility] PROGRESS NOTE  Sean Pope UXN:[id number] DOB: 1945-08-01 DOA: 10/26/2013 PCP: Tawanna Solo, MD  Assessment/Plan: Diarrhea/Ulcerative Colitis -Seems to be responding to steroids, with decreased frequency of stools and increased consistency. -GI following. -C Diff PCR negative.  ARF -Stable 2.3-2.4. -Continue IVF. -Recheck renal function in am.  Severe Protein-Caloric Malnutrition -Continue to advance diet. -Nutrition consult.  Hypercalcemia -Improving with IVF.  Code Status: Full Code Family Communication: Patient only  Disposition Plan: Home when ready   Consultants:  GI   Antibiotics:  None   Subjective: Feels better. Lots of gas.  Objective: Filed Vitals:   10/26/13 2102 10/26/13 2123 10/27/13 0500 10/27/13 1320  BP: 137/79  134/82 120/69  Pulse: 68 68 67 73  Temp: 98.1 F (36.7 C)  98.6 F (37 C) 98.1 F (36.7 C)  TempSrc: Oral  Oral Oral  Resp: 16  16 16   Height:      Weight:      SpO2: 98%  97% 98%    Intake/Output Summary (Last 24 hours) at 10/27/13 1424 Last data filed at 10/27/13 1230  Gross per 24 hour  Intake 2575.5 ml  Output      0 ml  Net 2575.5 ml   Filed Weights   10/26/13 0907 10/26/13 1230  Weight: 72.122 kg (159 lb) 72.122 kg (159 lb)    Exam:   General:  AA Ox3  Cardiovascular: RRR  Respiratory: CTA B  Abdomen: S/NT/ND/+BS  Extremities: 3+ edema bilaterally   Neurologic:  Non-focal  Data Reviewed: Basic Metabolic Panel:  Recent Labs Lab 10/26/13 0930 10/27/13 0403  NA 135* 133*  K 4.9 5.1  CL 98 99  CO2 30 26  GLUCOSE 99 121*  BUN 29* 33*  CREATININE 2.39* 2.43*  CALCIUM 12.1* 10.4   Liver Function Tests:  Recent Labs Lab 10/26/13 0930 10/27/13 0403  AST 14 12  ALT 12 11  ALKPHOS 75 67  BILITOT 0.3 0.2*  PROT 5.4* 4.5*  ALBUMIN 2.0* 1.8*    Recent Labs Lab 10/26/13 0930  LIPASE 9*   No results found for this basename: AMMONIA,  in the last 168  hours CBC:  Recent Labs Lab 10/26/13 0930 10/27/13 0403  WBC 11.5* 11.7*  NEUTROABS 8.6*  --   HGB 10.7* 9.0*  HCT 32.5* 27.6*  MCV 87.8 88.2  PLT 439* 364   Cardiac Enzymes:  Recent Labs Lab 10/26/13 0930  TROPONINI <0.30   BNP (last 3 results)  Recent Labs  10/26/13 0930  PROBNP 2729.0*   CBG: No results found for this basename: GLUCAP,  in the last 168 hours  Recent Results (from the past 240 hour(s))  URINE CULTURE     Status: None   Collection Time    10/26/13 10:55 AM      Result Value Ref Range Status   Specimen Description URINE, CATHETERIZED   Final   Special Requests NONE   Final   Culture  Setup Time     Final   Value: 10/26/2013 14:54     Performed at [facility]     Final   Value: 5,000 COLONIES/ML     Performed at [facility]   Culture     Final   Value: INSIGNIFICANT GROWTH     Performed at [facility]   Report Status 10/27/2013 FINAL   Final  CLOSTRIDIUM DIFFICILE BY PCR     Status: None   Collection Time  10/26/13  2:38 PM      Result Value Ref Range Status   C difficile by pcr NEGATIVE  NEGATIVE Final   Comment: Performed at [facility]     Studies: Dg Abd Acute W/chest  10/26/2013   CLINICAL DATA:  Diarrhea, upper abdominal pain  EXAM: ACUTE ABDOMEN SERIES (ABDOMEN 2 VIEW & CHEST 1 VIEW)  COMPARISON:  CT ABD/PELV WO CM dated 09/05/2013  FINDINGS: There is no evidence of dilated bowel loops or free intraperitoneal air. No radiopaque calculi or other significant radiographic abnormality is seen. Heart size and mediastinal contours are within normal limits. Both lungs are clear. Pulmonary hyperinflation may suggest emphysema.  IMPRESSION: Negative abdominal radiographs.  No acute cardiopulmonary disease.   Electronically Signed   By: Conchita Paris M.D.   On: 10/26/2013 09:29    Scheduled Meds: . enoxaparin (LOVENOX) injection  30 mg Subcutaneous Q24H  . ferrous sulfate  325 mg Oral BID   . methylPREDNISolone (SOLU-MEDROL) injection  60 mg Intravenous Q12H  . pantoprazole  40 mg Oral Daily  . simvastatin  40 mg Oral QPM   Continuous Infusions: . sodium chloride 1,000 mL (10/27/13 0405)    Active Problems:   Ulcerative chronic pancolitis   Diarrhea   ARF (acute renal failure)   GERD (gastroesophageal reflux disease)   UC (ulcerative colitis)   Protein-calorie malnutrition, severe   Hypercalcemia    Time spent: 35 minutes. Greater than 50% of this time was spent in direct contact with the patient coordinating care.    [facility] Pager [phone]  If 7PM-7AM, please contact night-coverage at [email or web address], password [facility] 10/27/2013, 2:24 PM  LOS: 1 day

## 2013-10-27 NOTE — Progress Notes (Signed)
    Progress Note   Subjective  feels improved over yesterday with less frequent BMs. Stool consistency improving.    Objective   Vital signs in last 24 hours: Temp:  [97.6 F (36.4 C)-98.6 F (37 C)] 98.6 F (37 C) (05/12 0500) Pulse Rate:  [67-79] 67 (05/12 0500) Resp:  [13-20] 16 (05/12 0500) BP: (125-160)/(69-87) 134/82 mmHg (05/12 0500) SpO2:  [86 %-100 %] 97 % (05/12 0500) Weight:  [159 lb (72.122 kg)] 159 lb (72.122 kg) (05/11 1230) Last BM Date: 10/26/13 (DECREASED # TODAY, FREQUENT STOOLING SMALL AMOUNTS ) General:    white male in NAD Heart:  Regular rate and rhythm Lungs: Respirations even and unlabored, lungs CTA bilaterally Abdomen:  Soft, nontender and nondistended. Normal bowel sounds. Extremities:  Without edema. Neurologic:  Alert and oriented,  grossly normal neurologically. Psych:  Cooperative. Normal mood and affect.  Lab Results:  Recent Labs  10/26/13 0930 10/27/13 0403  WBC 11.5* 11.7*  HGB 10.7* 9.0*  HCT 32.5* 27.6*  PLT 439* 364   BMET  Recent Labs  10/26/13 0930 10/27/13 0403  NA 135* 133*  K 4.9 5.1  CL 98 99  CO2 30 26  GLUCOSE 99 121*  BUN 29* 33*  CREATININE 2.39* 2.43*  CALCIUM 12.1* 10.4   LFT  Recent Labs  10/27/13 0403  PROT 4.5*  ALBUMIN 1.8*  AST 12  ALT 11  ALKPHOS 67  BILITOT 0.2*     Assessment / Plan:   1. Universal colitis. He seems to responding to steroids. Had 5-6 BMs last night (more solid) and none so far this am - all of which are improvements for him. Will advance to low fiber diet. Continue IVF and IV steroids. C-diff negative, remainder of GI pathogen panel pending  2. Hypecalcemia, resolved.   3. AKI. Continue IVF. Avoid nephrotoxic medications. Repeat am BMET  4. Protein calorie malnutrition. Will order BID Ensure to supplement meals    LOS: 1 day   Willia Craze  10/27/2013, 8:53 AM   ________________________________________________________________________  Velora Heckler GI MD  note:  I personally examined the patient, reviewed the data and agree with the assessment and plan described above. Noticeable improvement on 24 hours IV steroids. Will continue at least another 24-36 hours and then changed to prednisone (48m twice daily).  He asked about surgical options which we discussed in depth, he does understand that he has quite a ways to go before we could consider him unmanageable by medicines.   DOwens Loffler MD [facility] Pager [phone]

## 2013-10-28 ENCOUNTER — Inpatient Hospital Stay (HOSPITAL_COMMUNITY): Payer: Managed Care, Other (non HMO)

## 2013-10-28 DIAGNOSIS — K5289 Other specified noninfective gastroenteritis and colitis: Secondary | ICD-10-CM

## 2013-10-28 DIAGNOSIS — I509 Heart failure, unspecified: Secondary | ICD-10-CM

## 2013-10-28 DIAGNOSIS — N289 Disorder of kidney and ureter, unspecified: Secondary | ICD-10-CM

## 2013-10-28 DIAGNOSIS — I5030 Unspecified diastolic (congestive) heart failure: Secondary | ICD-10-CM

## 2013-10-28 DIAGNOSIS — R197 Diarrhea, unspecified: Secondary | ICD-10-CM

## 2013-10-28 LAB — CBC
HCT: 27.1 % — ABNORMAL LOW (ref 39.0–52.0)
Hemoglobin: 8.8 g/dL — ABNORMAL LOW (ref 13.0–17.0)
MCH: 28.4 pg (ref 26.0–34.0)
MCHC: 32.5 g/dL (ref 30.0–36.0)
MCV: 87.4 fL (ref 78.0–100.0)
PLATELETS: 378 10*3/uL (ref 150–400)
RBC: 3.1 MIL/uL — AB (ref 4.22–5.81)
RDW: 14 % (ref 11.5–15.5)
WBC: 9.3 10*3/uL (ref 4.0–10.5)

## 2013-10-28 LAB — GI PATHOGEN PANEL BY PCR, STOOL
C difficile toxin A/B: NEGATIVE
Campylobacter by PCR: NEGATIVE
Cryptosporidium by PCR: NEGATIVE
E COLI (ETEC) LT/ST: NEGATIVE
E COLI (STEC): NEGATIVE
E coli 0157 by PCR: NEGATIVE
G lamblia by PCR: NEGATIVE
NOROVIRUS G1/G2: NEGATIVE
Rotavirus A by PCR: NEGATIVE
SALMONELLA BY PCR: NEGATIVE
Shigella by PCR: NEGATIVE

## 2013-10-28 LAB — COMPREHENSIVE METABOLIC PANEL
ALT: 13 U/L (ref 0–53)
AST: 11 U/L (ref 0–37)
Albumin: 1.8 g/dL — ABNORMAL LOW (ref 3.5–5.2)
Alkaline Phosphatase: 68 U/L (ref 39–117)
BUN: 42 mg/dL — ABNORMAL HIGH (ref 6–23)
CO2: 25 mEq/L (ref 19–32)
CREATININE: 2.64 mg/dL — AB (ref 0.50–1.35)
Calcium: 9.6 mg/dL (ref 8.4–10.5)
Chloride: 102 mEq/L (ref 96–112)
GFR, EST AFRICAN AMERICAN: 27 mL/min — AB (ref >90)
GFR, EST NON AFRICAN AMERICAN: 23 mL/min — AB (ref >90)
Glucose, Bld: 138 mg/dL — ABNORMAL HIGH (ref 70–99)
Potassium: 5.1 mEq/L (ref 3.7–5.3)
Sodium: 134 mEq/L — ABNORMAL LOW (ref 137–147)
Total Bilirubin: <0.2 mg/dL — ABNORMAL LOW (ref 0.3–1.2)
Total Protein: 4.5 g/dL — ABNORMAL LOW (ref 6.0–8.3)

## 2013-10-28 MED ORDER — FUROSEMIDE 10 MG/ML IJ SOLN
80.0000 mg | Freq: Once | INTRAMUSCULAR | Status: AC
  Administered 2013-10-28: 80 mg via INTRAVENOUS
  Filled 2013-10-28: qty 8

## 2013-10-28 MED ORDER — METHYLPREDNISOLONE SODIUM SUCC 40 MG IJ SOLR
40.0000 mg | Freq: Two times a day (BID) | INTRAMUSCULAR | Status: DC
  Administered 2013-10-28 – 2013-10-29 (×2): 40 mg via INTRAVENOUS
  Filled 2013-10-28 (×2): qty 1

## 2013-10-28 MED ORDER — FAMOTIDINE 10 MG PO TABS
10.0000 mg | ORAL_TABLET | Freq: Two times a day (BID) | ORAL | Status: DC
  Administered 2013-10-28 – 2013-10-31 (×6): 10 mg via ORAL
  Filled 2013-10-28 (×8): qty 1

## 2013-10-28 NOTE — Progress Notes (Signed)
[facility] PROGRESS NOTE  Sean Pope BJS:[id number] DOB: 31-Mar-1946 DOA: 10/26/2013 PCP: Tawanna Solo, MD  Brief narrative: 68 year old male with past medical history of recently diagnosed ulcerative colitis who presented to [facility] ED 10/26/2013 from PCP referral for elevated creatinine of 4.7. Admission creatinine was 2.39. He has had intractable diarrhea which now seems to be responding to steroids well.  Assessment/Plan:   Principal Problem: Diarrhea/Ulcerative Colitis  - Seems to be responding to steroids - Appreciate GI following - C Diff PCR negative.    Active Problems: Acute renal failure  - Unclear etiology, does not seem to be an offending agent to cause ARF; could it be due to GI losses, possible - creatinine trending up to 2.6 - obtain renal US and urine sodium and creatineine - please do not use IV fluids, he seems to be volume overloaded Acute diastolic CHF - BNP in 6073 range; no 2 D ECHO on file - obtain 2 D ECHO - per carido will start 80 mg IV once and see how pt does Severe Protein-Caloric Malnutrition  - diet as tolerated   Hypercalcemia  - improved with IV fluids.   Code Status: Full Code  Family Communication: family at the bedside  Disposition Plan: Home when ready   Consultants:  GI  Antibiotics:  None   Robbie Lis, MD  [facility] Pager [phone]  If 7PM-7AM, please contact night-coverage [email or web address] Password [facility] 10/28/2013, 6:14 PM   LOS: 2 days    HPI/Subjective: Feels better.  Objective: Filed Vitals:   10/27/13 1320 10/27/13 2152 10/28/13 0555 10/28/13 1500  BP: 120/69 115/54 113/58 143/69  Pulse: 73 71 75 63  Temp: 98.1 F (36.7 C) 98.5 F (36.9 C) 97.8 F (36.6 C) 98.4 F (36.9 C)  TempSrc: Oral Oral Oral Oral  Resp: 16 17 16 16   Height:      Weight:      SpO2: 98% 98% 98% 100%    Intake/Output Summary (Last 24 hours) at 10/28/13 1814 Last data filed at 10/28/13 1500  Gross per 24 hour   Intake 2081.67 ml  Output      0 ml  Net 2081.67 ml    Exam:   General:  Pt is alert, follows commands appropriately, not in acute distress  Cardiovascular: Regular rate and rhythm, S1/S2, no murmurs, no rubs, no gallops  Respiratory: Clear to auscultation bilaterally, no wheezing, no crackles, no rhonchi  Abdomen: Soft, non tender, non distended, bowel sounds present, no guarding  Extremities: LE +3 edema, pulses DP and PT palpable bilaterally  Neuro: Grossly nonfocal  Data Reviewed: Basic Metabolic Panel:  Recent Labs Lab 10/26/13 0930 10/27/13 0403 10/28/13 0322  NA 135* 133* 134*  K 4.9 5.1 5.1  CL 98 99 102  CO2 30 26 25   GLUCOSE 99 121* 138*  BUN 29* 33* 42*  CREATININE 2.39* 2.43* 2.64*  CALCIUM 12.1* 10.4 9.6   Liver Function Tests:  Recent Labs Lab 10/26/13 0930 10/27/13 0403 10/28/13 0322  AST 14 12 11   ALT 12 11 13   ALKPHOS 75 67 68  BILITOT 0.3 0.2* <0.2*  PROT 5.4* 4.5* 4.5*  ALBUMIN 2.0* 1.8* 1.8*    Recent Labs Lab 10/26/13 0930  LIPASE 9*   No results found for this basename: AMMONIA,  in the last 168 hours CBC:  Recent Labs Lab 10/26/13 0930 10/27/13 0403 10/28/13 0322  WBC 11.5* 11.7* 9.3  NEUTROABS 8.6*  --   --   HGB 10.7* 9.0* 8.8*  HCT 32.5* 27.6* 27.1*  MCV 87.8 88.2 87.4  PLT 439* 364 378   Cardiac Enzymes:  Recent Labs Lab 10/26/13 0930  TROPONINI <0.30   BNP: No components found with this basename: POCBNP,  CBG: No results found for this basename: GLUCAP,  in the last 168 hours  URINE CULTURE     Status: None   Collection Time    10/26/13 10:55 AM      Result Value Ref Range Status   Specimen Description URINE, CATHETERIZED   Final   Value: INSIGNIFICANT GROWTH     Performed at [facility]   Report Status 10/27/2013 FINAL   Final  CLOSTRIDIUM DIFFICILE BY PCR     Status: None   Collection Time    10/26/13  2:38 PM      Result Value Ref Range Status   C difficile by pcr NEGATIVE   NEGATIVE Final   Comment: Performed at [facility]     Studies: No results found.  Scheduled Meds: . famotidine  10 mg Oral BID AC  . ferrous sulfate  325 mg Oral BID  . methylPREDNISolone (SOLU-MEDROL) injection  40 mg Intravenous Q12H  . simvastatin  40 mg Oral QPM

## 2013-10-28 NOTE — Progress Notes (Signed)
    Progress Note   Subjective  Stools continue to form and are less frequent   Objective   Vital signs in last 24 hours: Temp:  [97.8 F (36.6 C)-98.5 F (36.9 C)] 97.8 F (36.6 C) (05/13 0555) Pulse Rate:  [71-75] 75 (05/13 0555) Resp:  [16-17] 16 (05/13 0555) BP: (113-120)/(54-69) 113/58 mmHg (05/13 0555) SpO2:  [98 %] 98 % (05/13 0555) Last BM Date: 10/27/13 (2 BMs so far this evening per patient) General:    white male in NAD. Daughter at bedside Heart:  Regular rate and rhythm Lungs: Respirations even and unlabored, lungs CTA bilaterally Abdomen:  Soft, nontender and nondistended. Normal bowel sounds. Extremities:  2+ BLE edema.  Neurologic:  Alert and oriented,  grossly normal neurologically. Psych:  Cooperative. Normal mood and affect.  Lab Results:  Recent Labs  10/26/13 0930 10/27/13 0403 10/28/13 0322  WBC 11.5* 11.7* 9.3  HGB 10.7* 9.0* 8.8*  HCT 32.5* 27.6* 27.1*  PLT 439* 364 378   BMET  Recent Labs  10/26/13 0930 10/27/13 0403 10/28/13 0322  NA 135* 133* 134*  K 4.9 5.1 5.1  CL 98 99 102  CO2 30 26 25   GLUCOSE 99 121* 138*  BUN 29* 33* 42*  CREATININE 2.39* 2.43* 2.64*  CALCIUM 12.1* 10.4 9.6   LFT  Recent Labs  10/28/13 0322  PROT 4.5*  ALBUMIN 1.8*  AST 11  ALT 13  ALKPHOS 68  BILITOT <0.2*     Assessment / Plan:   1. Universal colitis,  c-diff negative, remainder of GI pathogen panel pending. Probably just worsening UC without superimposed infectious etiology. He is responding to steroids. Today is 3rd day of Solumedrol, will transition to oral prednisone following today's solumedrol dose. Tolerating solids. Will likely need escalation of therapy soon but that will be discussed at office visit.  2. Hypercalcemia, resolved.   3. AKI. Creatinine rising over last couple of days (2.39 >>>2.43>>>2,64) which is confusing given volume repletion and withdrawal of Asacol which was felt to be cause or at least a contributing factor to  AKI. Avoid nephrotoxic medications. His Lovenox dose has already been adjusted for creatinine clearance. Renal consult needed?  If so, patient's daughter requests Dr. Mercy Moore (friend of a friend)  4. Protein calorie malnutrition. Didn't tolerate Ensure (cause bowel disturbances). I have already talk to dietician to see if we can try a different supplement less likely to contribute to diarrhea.   5. Peripheral edema, likely combination of steroids, hypoalbuminemia. No sure why his BNP was elevated on admission though.   LOS: 2 days   Willia Craze  10/28/2013, 9:07 AM  ________________________________________________________________________  Velora Heckler GI MD note:  I personally examined the patient, reviewed the data and agree with the assessment and plan described above.  Will continue IV steroids (dose changed to solumedrol 40 bid today).  I agree that renal should be consulted.  His Cr was normal in 2013, the rise may be from mesalamine which has been stopped and will not be restarted. Plan will be for him to eventually transition to immunomodulator for his UC control (2-2.67m/kg/day azathiaprine). Will need TPMT enzyme activity checked prior.   DOwens Loffler MD [facility] Pager [phone]

## 2013-10-29 DIAGNOSIS — I509 Heart failure, unspecified: Secondary | ICD-10-CM

## 2013-10-29 LAB — BASIC METABOLIC PANEL
BUN: 53 mg/dL — ABNORMAL HIGH (ref 6–23)
CHLORIDE: 97 meq/L (ref 96–112)
CO2: 25 meq/L (ref 19–32)
CREATININE: 2.88 mg/dL — AB (ref 0.50–1.35)
Calcium: 9.4 mg/dL (ref 8.4–10.5)
GFR calc Af Amer: 24 mL/min — ABNORMAL LOW (ref >90)
GFR calc non Af Amer: 21 mL/min — ABNORMAL LOW (ref >90)
Glucose, Bld: 129 mg/dL — ABNORMAL HIGH (ref 70–99)
POTASSIUM: 4.6 meq/L (ref 3.7–5.3)
SODIUM: 136 meq/L — AB (ref 137–147)

## 2013-10-29 LAB — URINALYSIS, ROUTINE W REFLEX MICROSCOPIC
Bilirubin Urine: NEGATIVE
Glucose, UA: NEGATIVE mg/dL
Hgb urine dipstick: NEGATIVE
Ketones, ur: NEGATIVE mg/dL
LEUKOCYTES UA: NEGATIVE
Nitrite: NEGATIVE
PH: 5 (ref 5.0–8.0)
Protein, ur: NEGATIVE mg/dL
Specific Gravity, Urine: 1.008 (ref 1.005–1.030)
Urobilinogen, UA: 0.2 mg/dL (ref 0.0–1.0)

## 2013-10-29 MED ORDER — ALBUMIN HUMAN 25 % IV SOLN
25.0000 g | Freq: Once | INTRAVENOUS | Status: AC
  Administered 2013-10-29: 25 g via INTRAVENOUS
  Filled 2013-10-29: qty 100

## 2013-10-29 MED ORDER — PREDNISONE 20 MG PO TABS
40.0000 mg | ORAL_TABLET | Freq: Every day | ORAL | Status: AC
  Administered 2013-10-29 – 2013-10-31 (×3): 40 mg via ORAL
  Filled 2013-10-29 (×3): qty 2

## 2013-10-29 MED ORDER — FUROSEMIDE 10 MG/ML IJ SOLN
40.0000 mg | Freq: Once | INTRAMUSCULAR | Status: AC
  Administered 2013-10-29: 40 mg via INTRAVENOUS
  Filled 2013-10-29: qty 4

## 2013-10-29 NOTE — Progress Notes (Signed)
Progress Note   Subjective  patient feels he is steadily improving from GI standpoint. Stools solidifying   Objective   Vital signs in last 24 hours: Temp:  [98.1 F (36.7 C)-98.4 F (36.9 C)] 98.1 F (36.7 C) (05/14 0627) Pulse Rate:  [63-155] 63 (05/14 0627) Resp:  [16-18] 16 (05/14 0627) BP: (112-156)/(64-85) 118/67 mmHg (05/14 0627) SpO2:  [98 %-100 %] 99 % (05/14 0627) Last BM Date: 10/29/13 General:    Pleasant white male in NAD Heart:  Regular rate and rhythm Lungs: Respirations even and unlabored, lungs CTA bilaterally Abdomen:  Soft, nontender and nondistended. Normal bowel sounds. Extremities:  Pitting edema of BLE Neurologic:  Alert and oriented,  grossly normal neurologically. Psych:  Cooperative. Normal mood and affect.  Lab Results:  Recent Labs  10/26/13 0930 10/27/13 0403 10/28/13 0322  WBC 11.5* 11.7* 9.3  HGB 10.7* 9.0* 8.8*  HCT 32.5* 27.6* 27.1*  PLT 439* 364 378   BMET  Recent Labs  10/26/13 0930 10/27/13 0403 10/28/13 0322  NA 135* 133* 134*  K 4.9 5.1 5.1  CL 98 99 102  CO2 30 26 25   GLUCOSE 99 121* 138*  BUN 29* 33* 42*  CREATININE 2.39* 2.43* 2.64*  CALCIUM 12.1* 10.4 9.6   LFT  Recent Labs  10/28/13 0322  PROT 4.5*  ALBUMIN 1.8*  AST 11  ALT 13  ALKPHOS 68  BILITOT <0.2*    Studies/Results: US Renal  10/28/2013   CLINICAL DATA:  Elevated creatinine, history of renal cysts  EXAM: RENAL/URINARY TRACT ULTRASOUND COMPLETE  COMPARISON:  03/11/2013  FINDINGS: Right Kidney:  Length: 10.9 cm. Normal echogenicity. No hydronephrosis. Hypoechoic mid and lower pole cysts noted. Midpole cyst measures 4 cm and a lower pole cyst measures 2.2 cm.  Left Kidney:  Length: 10.4 cm. Normal echogenicity. No hydronephrosis. Hypoechoic mid and lower pole cysts also evident. Midpole cyst measures 1 cm. Lower pole cyst measures 2.5 cm.  Bladder:  Appears normal for degree of bladder distention.  IMPRESSION: Negative for hydronephrosis or acute  finding.  Bilateral renal cysts   Electronically Signed   By: Daryll Brod M.D.   On: 10/28/2013 20:25     Assessment / Plan:   1. Universal colitis, GI pathogen panel , including C-diff is negative. He is responding nicely to steroids.  Tolerating solids. Will likely need escalation of therapy soon but that will be discussed at office visit.   2. AKI. Creatinine rising over last couple of days (2.39 >>>2.43>>>2.64) which is confusing given volume repletion and withdrawal of Asacol which was felt to be cause or at least a contributing factor to AKI. Renal u/s negative for acute findings. Hospitalist consulted Nephrology and obtain urine electrolytes in the interim. BMET pending.  3. Elevated BNP on admission (prior to getting IVF). CHF? Hospitalist is getting echocardiogram. Patient got dose of IV lasix last evening.  4. Protein calorie malnutrition. Didn't tolerate Ensure (cause bowel disturbances). I have already talk to dietician to see if we can try a different supplement less likely to contribute to diarrhea.     LOS: 3 days   Willia Craze  10/29/2013, 9:16 AM   ________________________________________________________________________  Velora Heckler GI MD note:  I personally examined the patient, reviewed the data and agree with the assessment and plan described above.  Improving from GI perspective and I'm going to change him to PO steroids today (40 once daily).  ONgoing cardiology and renal workup.   Owens Loffler, MD [place]  [facility] Pager [phone]

## 2013-10-29 NOTE — Consult Note (Signed)
Reason for Consult: ARF on CKD stage 3 Referring Physician: Leisa Lenz MD [facility])   HPI:  68 year old Caucasian man with past medical history significant for subarachnoid hemorrhage in 2013, hypertension (off treatment for the past few months due to volume depletion/AKI and hypotension) and ulcerative colitis diagnosed in December, 2014. From review of labs, it also appears that he has chronic kidney disease stage III at baseline (creatinine 1.5 after recovery of significant acute renal failure back in March).  He was admitted to the hospital 4 days ago when noted to have elevated creatinine on his labs (4.7 last week) -on admission this was 2.39 and did not change overnight with intravenous fluids-initial presentation consistent with volume depletion. Urine output is not charted because the patient often passes urine about having bowel movements (no Foley catheter in place). Yesterday, he was started on furosemide for management of edema with good clinical response (edema over hands has resolved). He remains concerned of his pedal edema that he has had since 2 months ago.  Other than diarrhea, he denies any nausea or vomiting and reports that he is "able to keep up with fluid intake". Reports that he was taking NSAIDs for sciatica for a brief while(1-2 weeks) before he was told to stop. He has not had any recent intravenous contrast exposure (last one 2 months ago) for antibiotic exposure. He denies any obstructive or irritative urinary symptoms including flank pain and hematuria. He denies any prior history of acute renal failure prior to March. Does not have personal familial history of renal disease/autoimmune disorders. 2-D echocardiogram shows normal LV ejection fraction/no diastolic dysfunction.   09/08/2013  09/14/2013  10/16/2013  10/20/2013  10/26/2013  10/27/2013  10/29/2013   BUN 48 (H) 25 (H) 28 (H) 37 (H) 29 (H) 33 (H) 53 (H)  Creatinine 1.82 (H) 1.5 3.3 (H) 4.7 (HH) 2.39 (H) 2.43 (H) 2.88 (H)     Past Medical History  Diagnosis Date  . Stroke   . SAH (subarachnoid hemorrhage)   . Hypertension   . Hyperlipidemia   . Lung collapse 09/26/2011  . Acute respiratory failure with hypoxia 09/17/2011  . 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 Ulcerative colitis 05/28/2013    Past Surgical History  Procedure Laterality Date  . Aneurysm coiling    . Colonoscopy w/ biopsies  05/28/2013    Family History  Problem Relation Age of Onset  . Heart disease Mother     Social History:  reports that he has quit smoking. His smoking use included Cigarettes. He has a 25 pack-year smoking history. He has never used smokeless tobacco. He reports that he drinks alcohol. He reports that he does not use illicit drugs.  Allergies:  Allergies  Allergen Reactions  . No Known Allergies     Medications:  Scheduled: . famotidine  10 mg Oral BID AC  . ferrous sulfate  325 mg Oral BID  . predniSONE  40 mg Oral QAC breakfast  . simvastatin  40 mg Oral QPM    Results for orders placed during the hospital encounter of 10/26/13 (from the past 48 hour(s))  COMPREHENSIVE METABOLIC PANEL     Status: Abnormal   Collection Time    10/28/13  3:22 AM      Result Value Ref Range   Sodium 134 (*) 137 - 147 mEq/L   Potassium 5.1  3.7 - 5.3 mEq/L   Chloride 102  96 - 112 mEq/L   CO2 25  19 - 32 mEq/L  Glucose, Bld 138 (*) 70 - 99 mg/dL   BUN 42 (*) 6 - 23 mg/dL   Creatinine, Ser 2.64 (*) 0.50 - 1.35 mg/dL   Calcium 9.6  8.4 - 10.5 mg/dL   Total Protein 4.5 (*) 6.0 - 8.3 g/dL   Albumin 1.8 (*) 3.5 - 5.2 g/dL   AST 11  0 - 37 U/L   ALT 13  0 - 53 U/L   Alkaline Phosphatase 68  39 - 117 U/L   Total Bilirubin <0.2 (*) 0.3 - 1.2 mg/dL   GFR calc non Af Amer 23 (*) >90 mL/min   GFR calc Af Amer 27 (*) >90 mL/min   Comment: (NOTE)     The eGFR has been calculated using the CKD EPI equation.     This calculation has not been validated in all clinical situations.     eGFR's persistently  <90 mL/min signify possible Chronic Kidney     Disease.  CBC     Status: Abnormal   Collection Time    10/28/13  3:22 AM      Result Value Ref Range   WBC 9.3  4.0 - 10.5 K/uL   RBC 3.10 (*) 4.22 - 5.81 MIL/uL   Hemoglobin 8.8 (*) 13.0 - 17.0 g/dL   HCT 27.1 (*) 39.0 - 52.0 %   MCV 87.4  78.0 - 100.0 fL   MCH 28.4  26.0 - 34.0 pg   MCHC 32.5  30.0 - 36.0 g/dL   RDW 14.0  11.5 - 15.5 %   Platelets 378  150 - 400 K/uL  BASIC METABOLIC PANEL     Status: Abnormal   Collection Time    10/29/13 10:15 AM      Result Value Ref Range   Sodium 136 (*) 137 - 147 mEq/L   Potassium 4.6  3.7 - 5.3 mEq/L   Chloride 97  96 - 112 mEq/L   CO2 25  19 - 32 mEq/L   Glucose, Bld 129 (*) 70 - 99 mg/dL   BUN 53 (*) 6 - 23 mg/dL   Creatinine, Ser 2.88 (*) 0.50 - 1.35 mg/dL   Calcium 9.4  8.4 - 10.5 mg/dL   GFR calc non Af Amer 21 (*) >90 mL/min   GFR calc Af Amer 24 (*) >90 mL/min   Comment: (NOTE)     The eGFR has been calculated using the CKD EPI equation.     This calculation has not been validated in all clinical situations.     eGFR's persistently <90 mL/min signify possible Chronic Kidney     Disease.    US Renal  10/28/2013   CLINICAL DATA:  Elevated creatinine, history of renal cysts  EXAM: RENAL/URINARY TRACT ULTRASOUND COMPLETE  COMPARISON:  03/11/2013  FINDINGS: Right Kidney:  Length: 10.9 cm. Normal echogenicity. No hydronephrosis. Hypoechoic mid and lower pole cysts noted. Midpole cyst measures 4 cm and a lower pole cyst measures 2.2 cm.  Left Kidney:  Length: 10.4 cm. Normal echogenicity. No hydronephrosis. Hypoechoic mid and lower pole cysts also evident. Midpole cyst measures 1 cm. Lower pole cyst measures 2.5 cm.  Bladder:  Appears normal for degree of bladder distention.  IMPRESSION: Negative for hydronephrosis or acute finding.  Bilateral renal cysts   Electronically Signed   By: Daryll Brod M.D.   On: 10/28/2013 20:25    Review of Systems  Constitutional: Positive for weight  loss and malaise/fatigue. Negative for fever, chills and diaphoresis.  HENT: Negative.  Eyes: Negative.   Respiratory: Negative.   Cardiovascular: Positive for leg swelling. Negative for chest pain, palpitations, orthopnea, claudication and PND.  Gastrointestinal: Positive for diarrhea. Negative for heartburn, nausea, vomiting, abdominal pain, constipation, blood in stool and melena.  Genitourinary: Negative.   Musculoskeletal: Positive for myalgias. Negative for back pain, joint pain and neck pain.       Left sided sciatica pain  Skin: Negative.   Neurological: Positive for dizziness and weakness. Negative for tingling, tremors, sensory change, speech change and focal weakness.  Endo/Heme/Allergies: Negative for environmental allergies. Does not bruise/bleed easily.  Psychiatric/Behavioral: The patient is nervous/anxious.   All other systems reviewed and are negative.  Blood pressure 147/71, pulse 58, temperature 98.4 F (36.9 C), temperature source Oral, resp. rate 16, height 5' 11"  (1.803 m), weight 72.122 kg (159 lb), SpO2 100.00%. Physical Exam  Nursing note and vitals reviewed. Constitutional: He is oriented to person, place, and time. He appears well-developed and well-nourished. No distress.  HENT:  Head: Normocephalic and atraumatic.  Nose: Nose normal.  Mouth/Throat: No oropharyngeal exudate.  Eyes: EOM are normal. Pupils are equal, round, and reactive to light. No scleral icterus.  Conjuctival pallor noted  Neck: Normal range of motion. Neck supple. No JVD present. No tracheal deviation present. No thyromegaly present.  Cardiovascular: Normal rate, regular rhythm and normal heart sounds.  Exam reveals no friction rub.   No murmur heard. Respiratory: Effort normal and breath sounds normal. No respiratory distress. He has no wheezes. He has no rales.  GI: Bowel sounds are normal. He exhibits no distension and no mass. There is tenderness. There is no rebound and no guarding.   Lower quadrants tender- no rebound/guarding  Musculoskeletal: Normal range of motion. He exhibits edema.  2-3+ pitting edema bilaterally  Lymphadenopathy:    He has no cervical adenopathy.  Neurological: He is alert and oriented to person, place, and time.  Skin: Skin is warm and dry. No rash noted. No erythema. There is pallor.  Psychiatric: He has a normal mood and affect. His behavior is normal.    Assessment/Plan: 1. Acute renal failure chronic kidney disease stage III: Appears to be hemodynamically mediated based on the available data base and physical exam. Initially, it appears that he may have been volume depleted with likely evolution into ischemic ATN (this is reflected by is poor response to fluids on admission)-although cannot be accurately determined without input/output. Renal ultrasound does not display obstructive pathology. Urinalysis is negative for any protein or blood and will be repeated today (low index of suspicion that this is a glomerulonephritis). The presence of his edema poses a management conundrum with regards to fluid management as clinically, he appears to be volume depleted but the presence of pedal edema precludes aggressive intravenous fluids. I suspect that his edema is primarily from hypoalbuminemia/reduced oncotic pressure and now compounded by acute renal failure. I will try intravenous albumin with furosemide this evening. Suspect renal function will likely get worse before stabilizing/improving if this is indeed ATN. 2. Hypercalcemia: Patient reports to have been taking calcium carbonate supplement prior to hospitalization, this has been discontinued and calcium levels are improving with intravenous fluids (corrected levels still 11.2) 3. Hyponatremia/mild hyperkalemia: Secondary to acute renal injury and impaired electrolytes/water handling, monitor input/output and restrict potassium in diet. 4. Anemia: Most likely from malabsorption, we'll check iron  studies and consider ESA and repleting iron 5. Ulcerative colitis: On corticosteroids and close GI followup  Sean Pope 10/29/2013, 3:30 PM

## 2013-10-29 NOTE — Progress Notes (Addendum)
[facility] PROGRESS NOTE  Sean Pope JIR:[id number] DOB: 1946/05/05 DOA: 10/26/2013 PCP: Tawanna Solo, MD  Brief narrative: 68 year old male with past medical history of recently diagnosed ulcerative colitis who presented to [facility] ED 10/26/2013 from PCP referral for elevated creatinine of 4.7. Admission creatinine was 2.39. He has had intractable diarrhea which now seems to be responding to steroids well.   Assessment/Plan:   Principal Problem:  Diarrhea/Ulcerative Colitis  - Seems to be responding to steroids; has had 5 BM in past 24 hours (diarrhea primarily) - Per GI, patient was transitioned to prednisone 40 mg daily - C Diff PCR negative.  Active Problems:  Acute renal failure  - Unclear etiology, does not seem to be an offending agent to cause ARF; could it be due to GI losses, possible  - creatinine trending up to 2.6 -- > 2.8 - Renal ultrasound unremarkable. Urine electrolytes are still pending. - has received lasix 80 mg IV once 10/28/2013 with good diuresis - renal consulted  Acute diastolic CHF  - BNP in 7510 range; follow up 2 D ECHO - given 1 dose lasix 80 mg IV on 10/28/2013 with improvement in swelling  - hold IV fluids  Severe Protein-Caloric Malnutrition  - diet as tolerated  Hypercalcemia  - improved with IV fluids.   Code Status: Full Code  Family Communication: family at the bedside  Disposition Plan: Home when ready   Consultants:  GI  Renal Cardio - phone call only 5/13 [facility]) Antibiotics:  None    Robbie Lis, MD  [facility]  Pager [phone]   Robbie Lis, MD  [facility] Pager [phone]  If 7PM-7AM, please contact night-coverage [email or web address] Password [email or web address] 10/29/2013, 10:53 AM   LOS: 3 days    HPI/Subjective: Had 5 episodes of diarrhea in past 24 hours.  Objective: Filed Vitals:   10/28/13 2016 10/28/13 2051 10/29/13 0142 10/29/13 0627  BP: 132/69 156/85 112/64 118/67  Pulse: 155 65 76 63  Temp:  98.3 F (36.8 C)   98.1 F (36.7 C)  TempSrc: Oral   Oral  Resp: 18 18  16   Height:      Weight:      SpO2: 99% 98%  99%    Intake/Output Summary (Last 24 hours) at 10/29/13 1053 Last data filed at 10/29/13 0834  Gross per 24 hour  Intake    720 ml  Output      0 ml  Net    720 ml    Exam:   General:  Pt is alert, follows commands appropriately, not in acute distress  Cardiovascular: Regular rate and rhythm, S1/S2 appreciated   Respiratory: Clear to auscultation bilaterally, no wheezing  Abdomen: Soft, non tender, non distended, bowel sounds present  Extremities: LE +2 pitting edema, pulses DP and PT palpable bilaterally  Neuro: Grossly nonfocal  Data Reviewed: Basic Metabolic Panel:  Recent Labs Lab 10/26/13 0930 10/27/13 0403 10/28/13 0322  NA 135* 133* 134*  K 4.9 5.1 5.1  CL 98 99 102  CO2 30 26 25   GLUCOSE 99 121* 138*  BUN 29* 33* 42*  CREATININE 2.39* 2.43* 2.64*  CALCIUM 12.1* 10.4 9.6   Liver Function Tests:  Recent Labs Lab 10/26/13 0930 10/27/13 0403 10/28/13 0322  AST 14 12 11   ALT 12 11 13   ALKPHOS 75 67 68  BILITOT 0.3 0.2* <0.2*  PROT 5.4* 4.5* 4.5*  ALBUMIN 2.0* 1.8* 1.8*    Recent Labs Lab 10/26/13 0930  LIPASE 9*  No results found for this basename: AMMONIA,  in the last 168 hours CBC:  Recent Labs Lab 10/26/13 0930 10/27/13 0403 10/28/13 0322  WBC 11.5* 11.7* 9.3  NEUTROABS 8.6*  --   --   HGB 10.7* 9.0* 8.8*  HCT 32.5* 27.6* 27.1*  MCV 87.8 88.2 87.4  PLT 439* 364 378   Cardiac Enzymes:  Recent Labs Lab 10/26/13 0930  TROPONINI <0.30   BNP: No components found with this basename: POCBNP,  CBG: No results found for this basename: GLUCAP,  in the last 168 hours  URINE CULTURE     Status: None   Collection Time    10/26/13 10:55 AM      Result Value Ref Range Status   Specimen Description URINE, CATHETERIZED   Final   Value: INSIGNIFICANT GROWTH     Performed at [facility]   Report  Status 10/27/2013 FINAL   Final  CLOSTRIDIUM DIFFICILE BY PCR     Status: None   Collection Time    10/26/13  2:38 PM      Result Value Ref Range Status   C difficile by pcr NEGATIVE  NEGATIVE Final   Comment: Performed at [facility]     Studies: US Renal 10/28/2013    IMPRESSION: Negative for hydronephrosis or acute finding.  Bilateral renal cysts      Scheduled Meds: . famotidine  10 mg Oral BID AC  . ferrous sulfate  325 mg Oral BID  . predniSONE  40 mg Oral QAC breakfast  . simvastatin  40 mg Oral QPM

## 2013-10-29 NOTE — Progress Notes (Signed)
Echo Lab  2D Echocardiogram completed.  Roxie, RDCS 10/29/2013 9:39 AM

## 2013-10-30 ENCOUNTER — Telehealth: Payer: Self-pay

## 2013-10-30 DIAGNOSIS — K5190 Ulcerative colitis, unspecified, without complications: Secondary | ICD-10-CM

## 2013-10-30 LAB — RENAL FUNCTION PANEL
ALBUMIN: 2.2 g/dL — AB (ref 3.5–5.2)
BUN: 55 mg/dL — ABNORMAL HIGH (ref 6–23)
CHLORIDE: 102 meq/L (ref 96–112)
CO2: 26 meq/L (ref 19–32)
Calcium: 8.7 mg/dL (ref 8.4–10.5)
Creatinine, Ser: 2.64 mg/dL — ABNORMAL HIGH (ref 0.50–1.35)
GFR, EST AFRICAN AMERICAN: 27 mL/min — AB (ref >90)
GFR, EST NON AFRICAN AMERICAN: 23 mL/min — AB (ref >90)
Glucose, Bld: 121 mg/dL — ABNORMAL HIGH (ref 70–99)
Phosphorus: 2.4 mg/dL (ref 2.3–4.6)
Potassium: 4.3 mEq/L (ref 3.7–5.3)
SODIUM: 137 meq/L (ref 137–147)

## 2013-10-30 LAB — CREATININE, URINE, 24 HOUR
CREATININE 24H UR: 355 mg/d — AB (ref 800–2000)
Collection Interval-UCRE24: 24 hours
Creatinine, Urine: 27.3 mg/dL
Urine Total Volume-UCRE24: 1300 mL

## 2013-10-30 LAB — MAGNESIUM: Magnesium: 1.8 mg/dL (ref 1.5–2.5)

## 2013-10-30 LAB — IRON AND TIBC
IRON: 42 ug/dL (ref 42–135)
Saturation Ratios: 30 % (ref 20–55)
TIBC: 140 ug/dL — ABNORMAL LOW (ref 215–435)
UIBC: 98 ug/dL — ABNORMAL LOW (ref 125–400)

## 2013-10-30 LAB — SODIUM, URINE, RANDOM: SODIUM UR: 101 meq/L

## 2013-10-30 LAB — FERRITIN: FERRITIN: 101 ng/mL (ref 22–322)

## 2013-10-30 NOTE — Telephone Encounter (Signed)
Message copied by Barron Alvine on Fri Oct 30, 2013  9:59 AM ------      Message from: Milus Banister      Created: Fri Oct 30, 2013  9:52 AM       Adolphe Fortunato,      He will probably be going home this weekend from [facility].  He needs TPMT enzyme activity lab drawn at our office lab (he knows to show up early next week for it).  He also needs rov with Carlean Purl in 4-5 weeks.             Thanks             ------

## 2013-10-30 NOTE — Progress Notes (Signed)
[facility] ROUNDING NOTE   Subjective:   Interval History: less diarrhea  Objective:  Vital signs in last 24 hours:  Temp:  [97.9 F (36.6 C)-98.8 F (37.1 C)] 98.8 F (37.1 C) (05/15 1438) Pulse Rate:  [64-65] 64 (05/15 1438) Resp:  [16-18] 16 (05/15 1438) BP: (114-146)/(70-88) 114/86 mmHg (05/15 1438) SpO2:  [98 %-100 %] 100 % (05/15 1438) Weight:  [69 kg (152 lb 1.9 oz)] 69 kg (152 lb 1.9 oz) (05/15 0629)  Weight change:  Filed Weights   10/26/13 0907 10/26/13 1230 10/30/13 0629  Weight: 72.122 kg (159 lb) 72.122 kg (159 lb) 69 kg (152 lb 1.9 oz)    Intake/Output: I/O last 3 completed shifts: In: 1490 [P.O.:1440; IV Piggyback:50] Out: 1125 [Urine:1125]   Intake/Output this shift:  Total I/O In: 480 [P.O.:480] Out: -   CVS- RRR RS- CTA ABD- BS present soft non-distended EXT- no edema   Basic Metabolic Panel:  Recent Labs Lab 10/26/13 0930 10/27/13 0403 10/28/13 0322 10/29/13 1015 10/30/13 0330  NA 135* 133* 134* 136* 137  K 4.9 5.1 5.1 4.6 4.3  CL 98 99 102 97 102  CO2 30 26 25 25 26   GLUCOSE 99 121* 138* 129* 121*  BUN 29* 33* 42* 53* 55*  CREATININE 2.39* 2.43* 2.64* 2.88* 2.64*  CALCIUM 12.1* 10.4 9.6 9.4 8.7  MG  --   --   --   --  1.8  PHOS  --   --   --   --  2.4    Liver Function Tests:  Recent Labs Lab 10/26/13 0930 10/27/13 0403 10/28/13 0322 10/30/13 0330  AST 14 12 11   --   ALT 12 11 13   --   ALKPHOS 75 67 68  --   BILITOT 0.3 0.2* <0.2*  --   PROT 5.4* 4.5* 4.5*  --   ALBUMIN 2.0* 1.8* 1.8* 2.2*    Recent Labs Lab 10/26/13 0930  LIPASE 9*   No results found for this basename: AMMONIA,  in the last 168 hours  CBC:  Recent Labs Lab 10/26/13 0930 10/27/13 0403 10/28/13 0322  WBC 11.5* 11.7* 9.3  NEUTROABS 8.6*  --   --   HGB 10.7* 9.0* 8.8*  HCT 32.5* 27.6* 27.1*  MCV 87.8 88.2 87.4  PLT 439* 364 378    Cardiac Enzymes:  Recent Labs Lab 10/26/13 0930  TROPONINI <0.30    BNP: No  components found with this basename: POCBNP,   CBG: No results found for this basename: GLUCAP,  in the last 168 hours  Microbiology: Results for orders placed during the hospital encounter of 10/26/13  URINE CULTURE     Status: None   Collection Time    10/26/13 10:55 AM      Result Value Ref Range Status   Specimen Description URINE, CATHETERIZED   Final   Special Requests NONE   Final   Culture  Setup Time     Final   Value: 10/26/2013 14:54     Performed at [facility] Count     Final   Value: 5,000 COLONIES/ML     Performed at [facility]   Culture     Final   Value: INSIGNIFICANT GROWTH     Performed at [facility]   Report Status 10/27/2013 FINAL   Final  CLOSTRIDIUM DIFFICILE BY PCR     Status: None   Collection Time    10/26/13  2:38 PM  Result Value Ref Range Status   C difficile by pcr NEGATIVE  NEGATIVE Final   Comment: Performed at [facility]    Coagulation Studies: No results found for this basename: LABPROT, INR,  in the last 72 hours  Urinalysis:  Recent Labs  10/29/13 2105  COLORURINE YELLOW  LABSPEC 1.008  PHURINE 5.0  GLUCOSEU NEGATIVE  HGBUR NEGATIVE  BILIRUBINUR NEGATIVE  KETONESUR NEGATIVE  PROTEINUR NEGATIVE  UROBILINOGEN 0.2  NITRITE NEGATIVE  LEUKOCYTESUR NEGATIVE      Imaging: US Renal  10/28/2013   CLINICAL DATA:  Elevated creatinine, history of renal cysts  EXAM: RENAL/URINARY TRACT ULTRASOUND COMPLETE  COMPARISON:  03/11/2013  FINDINGS: Right Kidney:  Length: 10.9 cm. Normal echogenicity. No hydronephrosis. Hypoechoic mid and lower pole cysts noted. Midpole cyst measures 4 cm and a lower pole cyst measures 2.2 cm.  Left Kidney:  Length: 10.4 cm. Normal echogenicity. No hydronephrosis. Hypoechoic mid and lower pole cysts also evident. Midpole cyst measures 1 cm. Lower pole cyst measures 2.5 cm.  Bladder:  Appears normal for degree of bladder distention.  IMPRESSION: Negative for  hydronephrosis or acute finding.  Bilateral renal cysts   Electronically Signed   By: Daryll Brod M.D.   On: 10/28/2013 20:25     Medications:     . famotidine  10 mg Oral BID AC  . ferrous sulfate  325 mg Oral BID  . predniSONE  40 mg Oral QAC breakfast  . simvastatin  40 mg Oral QPM   acetaminophen, acetaminophen, morphine injection, ondansetron (ZOFRAN) IV, ondansetron  Assessment/ Plan:   Acute renal failure unremarkable renal ultrasound. Patient with probable AKI secondary to volume depletion and lasix. The lasix has been held and patient has a great urine output. Creatinine better today   HTN controlled  Continue gentle hydration   LOS: [place] @TODAY @5 :28 PM

## 2013-10-30 NOTE — Telephone Encounter (Signed)
Lab is in EPIC and pt already scheduled for f/u

## 2013-10-30 NOTE — Progress Notes (Signed)
[facility] PROGRESS NOTE  Sean Pope YIR:[id number] DOB: [birth year] DOA: 10/26/2013 PCP: Tawanna Solo, MD  Brief narrative: 68 year old male with past medical history of recently diagnosed ulcerative colitis who presented to [facility] ED 10/26/2013 from PCP referral for elevated creatinine of 4.7. Admission creatinine was 2.39. He has had intractable diarrhea which now seems to be responding to steroids well. His hospital course is complicaed with ongoing lower extremity edema and worsening renal function.  Assessment/Plan:   Principal Problem:  Diarrhea/Ulcerative Colitis  - Stable with steroids - GI transitioned to PO prednisone 10/29/2013  - C Diff PCR negative.  Active Problems:  Acute renal failure  - Unclear etiology, there does not seem to be an offending agent to cause ARF; possibly due to GI losses - Renal ultrasound unremarkable.  - has received lasix 80 mg IV once 10/28/2013 with good diuresis  - renal consulted and recommended albumin 25 gm x once with lasix 40 mg IV once; he still has lot of LE swelling Elevated BNP / Volume overload - BNP in 2000 range; 2 - D ECHO essentially unremarkable with EF 55% - given 1 dose lasix 80 mg IV on 10/28/2013; then lasix 40 mg IV once 10/29/2013  Severe Protein-Caloric Malnutrition  - diet as tolerated  Hypercalcemia  - improved with IV fluids.   Code Status: Full Code  Family Communication: family at the bedside  Disposition Plan: Home when ready   Consultants:  GI  Renal  Cardio - phone call only 5/13 ([place]) Antibiotics:  None    Robbie Lis, MD  [facility] Pager [phone]  If 7PM-7AM, please contact night-coverage [email or web address] Password [email or web address] 10/30/2013, 4:39 PM   LOS: 4 days    HPI/Subjective: Diarrhea about the same since yesterday.   Objective: Filed Vitals:   10/29/13 2130 10/30/13 0531 10/30/13 0629 10/30/13 1438  BP: 131/70 140/88  114/86  Pulse:  65  64  Temp:  98 F (36.7 C)  98.8 F  (37.1 C)  TempSrc:  Oral  Oral  Resp:  16  16  Height:      Weight:   69 kg (152 lb 1.9 oz)   SpO2:  98%  100%    Intake/Output Summary (Last 24 hours) at 10/30/13 1639 Last data filed at 10/30/13 1438  Gross per 24 hour  Intake   1130 ml  Output   1125 ml  Net      5 ml    Exam:   General:  Pt is not in acute distress  Cardiovascular: Regular rate and rhythm, S1/S2 (+)  Respiratory: Clear to auscultation bilaterally, no wheezing  Abdomen: Soft, non tender, non distended, bowel sounds present, no guarding  Extremities: (+)3 LE pitting edema, pulses DP and PT palpable bilaterally  Neuro: Grossly nonfocal  Data Reviewed: Basic Metabolic Panel:  Recent Labs Lab 10/26/13 0930 10/27/13 0403 10/28/13 0322 10/29/13 1015 10/30/13 0330  NA 135* 133* 134* 136* 137  K 4.9 5.1 5.1 4.6 4.3  CL 98 99 102 97 102  CO2 30 26 25 25 26   GLUCOSE 99 121* 138* 129* 121*  BUN 29* 33* 42* 53* 55*  CREATININE 2.39* 2.43* 2.64* 2.88* 2.64*  CALCIUM 12.1* 10.4 9.6 9.4 8.7  MG  --   --   --   --  1.8  PHOS  --   --   --   --  2.4   Liver Function Tests:  Recent Labs Lab 10/26/13 0930 10/27/13 0403 10/28/13 0322 10/30/13  0330  AST 14 12 11   --   ALT 12 11 13   --   ALKPHOS 75 67 68  --   BILITOT 0.3 0.2* <0.2*  --   PROT 5.4* 4.5* 4.5*  --   ALBUMIN 2.0* 1.8* 1.8* 2.2*    Recent Labs Lab 10/26/13 0930  LIPASE 9*   No results found for this basename: AMMONIA,  in the last 168 hours CBC:  Recent Labs Lab 10/26/13 0930 10/27/13 0403 10/28/13 0322  WBC 11.5* 11.7* 9.3  NEUTROABS 8.6*  --   --   HGB 10.7* 9.0* 8.8*  HCT 32.5* 27.6* 27.1*  MCV 87.8 88.2 87.4  PLT 439* 364 378   Cardiac Enzymes:  Recent Labs Lab 10/26/13 0930  TROPONINI <0.30   BNP: No components found with this basename: POCBNP,  CBG: No results found for this basename: GLUCAP,  in the last 168 hours  Recent Results (from the past 240 hour(s))  URINE CULTURE     Status: None    Collection Time    10/26/13 10:55 AM      Result Value Ref Range Status   Specimen Description URINE, CATHETERIZED   Final   Special Requests NONE   Final   Culture  Setup Time     Final   Value: 10/26/2013 14:54     Performed at [place]     Final   Value: 5,000 COLONIES/ML     Performed at [facility]   Culture     Final   Value: INSIGNIFICANT GROWTH     Performed at [facility]   Report Status 10/27/2013 FINAL   Final  CLOSTRIDIUM DIFFICILE BY PCR     Status: None   Collection Time    10/26/13  2:38 PM      Result Value Ref Range Status   C difficile by pcr NEGATIVE  NEGATIVE Final   Comment: Performed at [facility]     Studies: US Renal  10/28/2013   CLINICAL DATA:  Elevated creatinine, history of renal cysts  EXAM: RENAL/URINARY TRACT ULTRASOUND COMPLETE  COMPARISON:  03/11/2013  FINDINGS: Right Kidney:  Length: 10.9 cm. Normal echogenicity. No hydronephrosis. Hypoechoic mid and lower pole cysts noted. Midpole cyst measures 4 cm and a lower pole cyst measures 2.2 cm.  Left Kidney:  Length: 10.4 cm. Normal echogenicity. No hydronephrosis. Hypoechoic mid and lower pole cysts also evident. Midpole cyst measures 1 cm. Lower pole cyst measures 2.5 cm.  Bladder:  Appears normal for degree of bladder distention.  IMPRESSION: Negative for hydronephrosis or acute finding.  Bilateral renal cysts   Electronically Signed   By: Daryll Brod M.D.   On: 10/28/2013 20:25    Scheduled Meds: . famotidine  10 mg Oral BID AC  . ferrous sulfate  325 mg Oral BID  . predniSONE  40 mg Oral QAC breakfast  . simvastatin  40 mg Oral QPM   Continuous Infusions:

## 2013-10-30 NOTE — Progress Notes (Signed)
    Progress Note   Subjective  BMs continue to improve. Only a couple of BMs during the night (nearly solid)   Objective   Vital signs in last 24 hours: Temp:  [97.9 F (36.6 C)-98.4 F (36.9 C)] 98 F (36.7 C) (05/15 0531) Pulse Rate:  [58-65] 65 (05/15 0531) Resp:  [16-18] 16 (05/15 0531) BP: (131-147)/(70-88) 140/88 mmHg (05/15 0531) SpO2:  [98 %-100 %] 98 % (05/15 0531) Weight:  [152 lb 1.9 oz (69 kg)] 152 lb 1.9 oz (69 kg) (05/15 0629) Last BM Date: 10/29/13 General:    white male in NAD Heart:  Regular rate and rhythm Lungs: Respirations even and unlabored Abdomen:  Soft, nontender and nondistended. Normal bowel sounds. Extremities:  PItting edema of distal lower extremities.  Neurologic:  Alert and oriented,  grossly normal neurologically. Psych:  Cooperative. Normal mood and affect.  Lab Results:  BMET  Recent Labs  10/28/13 0322 10/29/13 1015 10/30/13 0330  NA 134* 136* 137  K 5.1 4.6 4.3  CL 102 97 102  CO2 25 25 26   GLUCOSE 138* 129* 121*  BUN 42* 53* 55*  CREATININE 2.64* 2.88* 2.64*  CALCIUM 9.6 9.4 8.7   LFT  Recent Labs  10/28/13 0322 10/30/13 0330  PROT 4.5*  --   ALBUMIN 1.8* 2.2*  AST 11  --   ALT 13  --   ALKPHOS 68  --   BILITOT <0.2*  --       Assessment / Plan:   1. Universal colitis. He s responding nicely to steroids. Tolerating solids. Will likely need escalation of therapy soon but that will be discussed at office visit.   2. AKI. Nephrology evaluated and felt this was likely secondary to volume depletion which evolved into ischemic ATN. Creatinine down from 2.88 yesterday to to 2.64 today.  3. Bilateral lower extremity edema.  This edema has been present  For 3-4 months.  This may be combination of steroids and hypoalbuminema. Difficult to treat given AKI. He got IV albumin and IV lasix last night. Patient tells me had no urine output last night and hasn't urinated any more than usual so far this am.   4. Protein calorie  malnutrition. Didn't tolerate Ensure (caused bowel disturbances).  5. Normocytic anemia, acute on chronic. Suspect the drop is dilutional given IVF. No overt bleeding.     LOS: 4 days   Willia Craze  10/30/2013, 8:42 AM   ________________________________________________________________________  Velora Heckler GI MD note:  I personally examined the patient, reviewed the data and agree with the assessment and plan described above.  He is much better on steroids.  From GI standpoint he is OK to go home but with renal issues he may need hospitalization a bit longer. He knows to remain on prednisone 41m daily for now.  I will arrange for TPMT testing early next week as outpatient at our office (lab draw).  We will decide after that enzyme activity level is back about immunomodulator dosing (likely azathiaprine 2-2.54mkg/day).  He will also be contacted by our office about return visit in 4-5 weeks.     DaOwens LofflerMD [facility] Pager [phone]

## 2013-10-31 LAB — BASIC METABOLIC PANEL WITH GFR
BUN: 48 mg/dL — ABNORMAL HIGH (ref 6–23)
CO2: 28 meq/L (ref 19–32)
Calcium: 8.6 mg/dL (ref 8.4–10.5)
Chloride: 105 meq/L (ref 96–112)
Creatinine, Ser: 1.94 mg/dL — ABNORMAL HIGH (ref 0.50–1.35)
GFR calc Af Amer: 39 mL/min — ABNORMAL LOW
GFR calc non Af Amer: 34 mL/min — ABNORMAL LOW
Glucose, Bld: 104 mg/dL — ABNORMAL HIGH (ref 70–99)
Potassium: 4.6 meq/L (ref 3.7–5.3)
Sodium: 140 meq/L (ref 137–147)

## 2013-10-31 MED ORDER — FAMOTIDINE 10 MG PO TABS: 10.0000 mg | ORAL_TABLET | Freq: Two times a day (BID) | ORAL | Status: DC

## 2013-10-31 MED ORDER — POTASSIUM CHLORIDE ER 20 MEQ PO TBCR: 10.0000 meq | EXTENDED_RELEASE_TABLET | Freq: Every day | ORAL | Status: DC

## 2013-10-31 MED ORDER — FUROSEMIDE 40 MG PO TABS
40.0000 mg | ORAL_TABLET | Freq: Every day | ORAL | Status: DC
Start: 2013-10-31 — End: 2013-11-17

## 2013-10-31 MED ORDER — PREDNISONE 20 MG PO TABS: 40.0000 mg | ORAL_TABLET | Freq: Every day | ORAL | Status: DC

## 2013-10-31 NOTE — Discharge Instructions (Signed)
Ulcerative Colitis Ulcerative colitis is a long lasting swelling and soreness (inflammation) of the colon (large intestine). In patients with ulcerative colitis, sores (ulcers) and inflammation of the inner lining of the colon lead to illness. Ulcerative colitis can also cause problems outside the digestive tract.  Ulcerative colitis is closely related to another condition of inflammation of the intestines called Crohn's disease. Together, they are frequently referred to as inflammatory bowel disease (IBD). Ulcerative colitis and Crohn's diseases are conditions that can last years to decades. Men and women are affected equally. They most commonly begin during adolescence and early adulthood. SYMPTOMS  Common symptoms of ulcerative colitis include rectal bleeding and diarrhea. There is a wide range of symptoms among patients with this disease depending on how severe the disease is. Some of these symptoms are:  Abdominal pain or cramping.  Diarrhea.  Fever.  Tiredness (fatigue).  Weight loss.  Night sweats.  Rectal pain.  Feeling the immediate need to have a bowel movement (rectal urgency). CAUSES  Ulcerative colitis is caused by increased activity of the immune system in the intestines. The immune system is the system that protects the body against disease such as harmful bacteria, viruses, fungi, and other foreign invaders. When the immune system overacts, it causes inflammation. The cause of the increased immune system activity is not known. This over activity causes long-lasting inflammation and ulceration. This condition may be passed down from your parents (inherited). Brothers, sisters, children, and parents of patients with IBD are more likely to develop these diseases. It is not contagious. This means you cannot catch it from someone else. DIAGNOSIS  Your caregiver may suspect ulcerative colitis based on your symptoms and exam. Blood tests may confirm that there is a problem. You may  be asked to submit a stool specimen for examination. X-rays and CT scans may be necessary. Ultimately, the diagnosis is usually made after a flexible tube is inserted via your anus and your colon is examined under sedation (colonoscopy). With this test, the specialist can take a tiny tissue sample from inside the bowel (biopsy). Examination of this biopsy tissue under a microscopy can reveal ulcerative colitis as the cause of your symptoms. TREATMENT   There is no cure for ulcerative colitis.  Complications such as massive bleeding from the colon (hemorrhage), development of a hole in the colon (perforation), or the development of precancerous or cancerous changes of the colon may require surgery.  Medications are often used to decrease inflammation and control the immune system. These include medicines related to aspirin, steroid medications, and newer and stronger medications to slow down the immune system. Some medications may be used as suppositories or enemas. A number of other medications are used or have been studied. Your caregiver will make specific recommendations. HOME CARE INSTRUCTIONS   There is no cure for ulcerative colitis disease. The best treatment is frequent checkups with your caregiver. Periodic reevaluation is important.  Symptoms such as diarrhea can be controlled with medications. Avoid foods that have a laxative effect such fresh fruit and vegetables and dairy products. During flare ups, you can rest your bowel by staying away from solid foods. Drink clear liquids frequently during the day. Electrolyte or rehydrating fluids are best. Your caregiver can help you with suggestions. Drink often to prevent dehydration. When diarrhea has cleared, eat smaller meals and more often. Avoid food additives and stimulants such as caffeine (coffee, tea, many sodas, or chocolate). Avoid dairy products. Enzyme supplements may help if you develop intolerance to  a sugar in dairy products  (lactose). Ask your caregiver or dietitian about specific dietary instructions.  If you had surgery, be sure you understand your care instructions thoroughly, including proper care of any surgical wounds.  Take any medications exactly as prescribed.  Try to maintain a positive attitude. Learn relaxation techniques such as self hypnosis, mental imaging, and muscle relaxation. If possible, avoid stresses that aggravate your condition. Exercise regularly. Follow your diet. Always get plenty of rest. SEEK MEDICAL CARE IF:   Your symptoms fail to improve after a week or two of new treatment.  You experience continued weight loss.  You have ongoing crampy digestion or loose bowels.  You develop a new skin rash, skin sores, or eye problems. SEEK IMMEDIATE MEDICAL CARE IF:   You have worsening of your symptoms or develop new symptoms.  You have an oral temperature above 102 F (38.9 C), not controlled by medicine.  You develop bloody diarrhea.  You have severe abdominal pain. Document Released: 03/14/2005 Document Revised: 08/27/2011 Document Reviewed: 02/11/2007 [facility] Patient Information 2014 [place], [place]. Kidney Failure Kidney failure happens when the kidneys cannot remove waste and excess fluid that naturally builds up in your blood after your body breaks down food. This leads to a dangerous buildup of waste products and fluid in the blood. HOME CARE  Follow your diet as told by your doctor.  Take all medicines as told by your doctor.  Keep all of your dialysis appointments. Call if you are unable to keep an appointment. GET HELP RIGHT AWAY IF:   You make a lot more or very little pee (urine).  Your face or ankles puff up (swell).  You develop shortness of breath.  You develop weakness, feel tired, or you do not feel hungry (appetite loss).  You feel poorly for no known reason. MAKE SURE YOU:   Understand these instructions.  Will watch your condition.  Will get  help right away if you are not doing well or get worse. Document Released: 08/29/2009 Document Revised: 08/27/2011 Document Reviewed: 10/05/2009 [facility] Patient Information 2014 [place], [place].

## 2013-10-31 NOTE — Discharge Summary (Signed)
Physician Discharge Summary  Sean Pope XTG:[id number] DOB: [birth year] DOA: 10/26/2013  PCP: Tawanna Solo, MD  Admit date: 10/26/2013 Discharge date: 10/31/2013  Recommendations for Outpatient Follow-up:  You will continue prednisone 40 mg daily until seen by GI outpatient per scheduled appointment.  You kidney function has significantly improved since admission, creatinine is 1.94 at the time of discharge. Please follow up with [facility], information provided in follow up section. Per Dr. Clois Dupes of nephrology you may continue lasix 40 mg daily due to extensive leg swelling. As mentioned, follow up in [facility] in about 1 week to have your renal function rechecked.  Since lasix can deplete potassium stores, please take potassium 20 meq daily while you are on lasix. If your leg swelling improves then you my stop lasix.    Discharge Diagnoses:  Active Problems:   Ulcerative chronic pancolitis   Diarrhea   ARF (acute renal failure)   GERD (gastroesophageal reflux disease)   UC (ulcerative colitis)   Protein-calorie malnutrition, severe   Hypercalcemia    Discharge Condition: stable  Diet recommendation: as tolerated  History of present illness:  68 year old male with past medical history of recently diagnosed ulcerative colitis who presented to [facility] ED 10/26/2013 from PCP referral for elevated creatinine of 4.7. Admission creatinine was 2.39. He has had intractable diarrhea which now seems to be responding to steroids well. His hospital course is complicaed with ongoing lower extremity edema and worsening renal function. He was given few doses of lasix (80 mg IV once then 40 mg IV once with albumin infusion). WHile his edema has not significantly improved his creatinine has trended down to less than 2 which is better than admission value.  Assessment/Plan:   Principal Problem:  Diarrhea/Ulcerative Colitis  - Stable with steroids  - GI transitioned to PO  prednisone 10/29/2013; needs to continue until seen outpt GI - C Diff PCR negative.  Active Problems:  Acute renal failure  - Unclear etiology, there does not seem to be an offending agent to cause ARF; possibly due to GI losses  - Renal ultrasound unremarkable.  - has received lasix 80 mg IV once 10/28/2013 with good diuresis  - renal consulted and recommended albumin 25 gm x once with lasix 40 mg IV once 5/14 - per renal, continue lasix 40 mg daily with potassium supplementation and follow up with renal in 1-2 weeks after discharge to make sure Cr is improving and swelling is improving  Elevated BNP / Volume overload  - BNP in 2000 range; 2 - D ECHO essentially unremarkable with EF 55%  - given 1 dose lasix 80 mg IV on 10/28/2013; then lasix 40 mg IV once 10/29/2013  - lasix as above on discharge  Severe Protein-Caloric Malnutrition  - diet as tolerated  Hypercalcemia  - improved with IV fluids.   Code Status: Full Code  Family Communication: family at the bedside   Consultants:  GI  Renal  Cardio - phone call only 5/13 ([facility]) Antibiotics:  None    Signed:  Robbie Lis, MD  [facility] 10/31/2013, 1:10 PM  Pager #: [phone]   Discharge Exam: Filed Vitals:   10/31/13 0601  BP: 153/90  Pulse: 64  Temp: 98.9 F (37.2 C)  Resp: 16   Filed Vitals:   10/30/13 1438 10/30/13 2118 10/31/13 0601 10/31/13 0633  BP: 114/86 134/76 153/90   Pulse: 64 74 64   Temp: 98.8 F (37.1 C) 99.4 F (37.4 C) 98.9 F (  37.2 C)   TempSrc: Oral Oral Oral   Resp: 16 18 16    Height:      Weight:    69.174 kg (152 lb 8 oz)  SpO2: 100% 99% 100%     General: Pt is alert, follows commands appropriately, not in acute distress Cardiovascular: Regular rate and rhythm, S1/S2 +, no murmurs, no rubs, no gallops Respiratory: Clear to auscultation bilaterally, no wheezing, no crackles, no rhonchi Abdominal: Soft, non tender, non distended, bowel sounds +, no  guarding Extremities: +3 LE pitting edema up to his knees, no cyanosis, pulses palpable bilaterally DP and PT Neuro: Grossly nonfocal  Discharge Instructions  Discharge Instructions   Call MD for:  difficulty breathing, headache or visual disturbances    Complete by:  As directed      Call MD for:  persistant dizziness or light-headedness    Complete by:  As directed      Call MD for:  persistant nausea and vomiting    Complete by:  As directed      Call MD for:  severe uncontrolled pain    Complete by:  As directed      Diet - low sodium heart healthy    Complete by:  As directed      Discharge instructions    Complete by:  As directed   You will continue prednisone 40 mg daily until seen by GI outpatient per scheduled appointment.  You kidney function has significantly improved since admission, creatinine is 1.94 at the time of discharge. Please follow up with [facility], information provided in follow up section.     Increase activity slowly    Complete by:  As directed             Medication List    STOP taking these medications       Mesalamine 800 MG Tbec  Commonly known as:  ASACOL HD      TAKE these medications       calcium carbonate 500 MG chewable tablet  Commonly known as:  TUMS - dosed in mg elemental calcium  Chew 1 tablet (200 mg of elemental calcium total) by mouth 3 (three) times daily as needed for indigestion or heartburn.     famotidine 10 MG tablet  Commonly known as:  PEPCID  Take 1 tablet (10 mg total) by mouth 2 (two) times daily before a meal.     IMODIUM A-D 1 MG/7.5ML Liqd  Generic drug:  Loperamide HCl  Take 30 mLs by mouth as needed.     Iron 325 (65 FE) MG Tabs  Take 1 tablet by mouth 2 (two) times daily.     MENS MULTIVITAMIN PLUS PO  Take 1 tablet by mouth.     predniSONE 20 MG tablet  Commonly known as:  DELTASONE  Take 2 tablets (40 mg total) by mouth daily with breakfast.     simvastatin 40 MG tablet  Commonly  known as:  ZOCOR  Take 40 mg by mouth every evening.           Follow-up Information   Follow up with Tawanna Solo, MD. Schedule an appointment as soon as possible for a visit in 2 weeks.   Specialty:  Family Medicine   Contact information:   [place] [address] [phone]       Follow up with [facility]. Schedule an appointment as soon as possible for a visit in 1 week.   Contact  information:   [place] [address] [phone]        The results of significant diagnostics from this hospitalization (including imaging, microbiology, ancillary and laboratory) are listed below for reference.    Significant Diagnostic Studies: US Renal  10/28/2013   CLINICAL DATA:  Elevated creatinine, history of renal cysts  EXAM: RENAL/URINARY TRACT ULTRASOUND COMPLETE  COMPARISON:  03/11/2013  FINDINGS: Right Kidney:  Length: 10.9 cm. Normal echogenicity. No hydronephrosis. Hypoechoic mid and lower pole cysts noted. Midpole cyst measures 4 cm and a lower pole cyst measures 2.2 cm.  Left Kidney:  Length: 10.4 cm. Normal echogenicity. No hydronephrosis. Hypoechoic mid and lower pole cysts also evident. Midpole cyst measures 1 cm. Lower pole cyst measures 2.5 cm.  Bladder:  Appears normal for degree of bladder distention.  IMPRESSION: Negative for hydronephrosis or acute finding.  Bilateral renal cysts   Electronically Signed   By: Daryll Brod M.D.   On: 10/28/2013 20:25   Dg Abd Acute W/chest  10/26/2013   CLINICAL DATA:  Diarrhea, upper abdominal pain  EXAM: ACUTE ABDOMEN SERIES (ABDOMEN 2 VIEW & CHEST 1 VIEW)  COMPARISON:  CT ABD/PELV WO CM dated 09/05/2013  FINDINGS: There is no evidence of dilated bowel loops or free intraperitoneal air. No radiopaque calculi or other significant radiographic abnormality is seen. Heart size and mediastinal contours are within normal limits. Both lungs are clear. Pulmonary hyperinflation may suggest emphysema.  IMPRESSION:  Negative abdominal radiographs.  No acute cardiopulmonary disease.   Electronically Signed   By: Conchita Paris M.D.   On: 10/26/2013 09:29    Microbiology: Recent Results (from the past 240 hour(s))  URINE CULTURE     Status: None   Collection Time    10/26/13 10:55 AM      Result Value Ref Range Status   Specimen Description URINE, CATHETERIZED   Final   Special Requests NONE   Final   Culture  Setup Time     Final   Value: 10/26/2013 14:54     Performed at [facility]     Final   Value: 5,000 COLONIES/ML     Performed at [facility]   Culture     Final   Value: INSIGNIFICANT GROWTH     Performed at [facility]   Report Status 10/27/2013 FINAL   Final  CLOSTRIDIUM DIFFICILE BY PCR     Status: None   Collection Time    10/26/13  2:38 PM      Result Value Ref Range Status   C difficile by pcr NEGATIVE  NEGATIVE Final   Comment: Performed at [facility]: Basic Metabolic Panel:  Recent Labs Lab 10/27/13 0403 10/28/13 0322 10/29/13 1015 10/30/13 0330 10/31/13 0645  NA 133* 134* 136* 137 140  K 5.1 5.1 4.6 4.3 4.6  CL 99 102 97 102 105  CO2 26 25 25 26 28   GLUCOSE 121* 138* 129* 121* 104*  BUN 33* 42* 53* 55* 48*  CREATININE 2.43* 2.64* 2.88* 2.64* 1.94*  CALCIUM 10.4 9.6 9.4 8.7 8.6  MG  --   --   --  1.8  --   PHOS  --   --   --  2.4  --    Liver Function Tests:  Recent Labs Lab 10/26/13 0930 10/27/13 0403 10/28/13 0322 10/30/13 0330  AST 14 12 11   --   ALT 12 11 13   --  ALKPHOS 75 67 68  --   BILITOT 0.3 0.2* <0.2*  --   PROT 5.4* 4.5* 4.5*  --   ALBUMIN 2.0* 1.8* 1.8* 2.2*    Recent Labs Lab 10/26/13 0930  LIPASE 9*   No results found for this basename: AMMONIA,  in the last 168 hours CBC:  Recent Labs Lab 10/26/13 0930 10/27/13 0403 10/28/13 0322  WBC 11.5* 11.7* 9.3  NEUTROABS 8.6*  --   --   HGB 10.7* 9.0* 8.8*  HCT 32.5* 27.6* 27.1*  MCV 87.8 88.2 87.4  PLT 439* 364 378    Cardiac Enzymes:  Recent Labs Lab 10/26/13 0930  TROPONINI <0.30   BNP: BNP (last 3 results)  Recent Labs  10/26/13 0930  PROBNP 2729.0*   CBG: No results found for this basename: GLUCAP,  in the last 168 hours  Time coordinating discharge: Over 30 minutes

## 2013-11-02 ENCOUNTER — Other Ambulatory Visit: Payer: Medicare HMO

## 2013-11-02 ENCOUNTER — Telehealth: Payer: Self-pay | Admitting: Internal Medicine

## 2013-11-02 DIAGNOSIS — K5190 Ulcerative colitis, unspecified, without complications: Secondary | ICD-10-CM

## 2013-11-02 NOTE — Telephone Encounter (Signed)
Patient notified that Dr. Eugenia Pancoast wanted him seen by Dr. Carlean Purl for 4-5 weeks and the 6/2 appt is good.  He is satisfied with this plan.

## 2013-11-12 LAB — THIOPURINE METHYLTRANSFERASE (TPMT), RBC: Thiopurine Methyltransferase, RBC: 22 (ref >12)

## 2013-11-17 ENCOUNTER — Encounter: Payer: Self-pay | Admitting: Internal Medicine

## 2013-11-17 ENCOUNTER — Ambulatory Visit (INDEPENDENT_AMBULATORY_CARE_PROVIDER_SITE_OTHER): Payer: Medicare HMO | Admitting: Internal Medicine

## 2013-11-17 VITALS — BP 108/64 | HR 64 | Ht 71.0 in | Wt 147.0 lb

## 2013-11-17 DIAGNOSIS — K5100 Ulcerative (chronic) pancolitis without complications: Secondary | ICD-10-CM

## 2013-11-17 DIAGNOSIS — N179 Acute kidney failure, unspecified: Secondary | ICD-10-CM

## 2013-11-17 DIAGNOSIS — E43 Unspecified severe protein-calorie malnutrition: Secondary | ICD-10-CM

## 2013-11-17 MED ORDER — DIPHENOXYLATE-ATROPINE 2.5-0.025 MG PO TABS: 1.0000 | ORAL_TABLET | Freq: Three times a day (TID) | ORAL | Status: DC

## 2013-11-17 NOTE — Assessment & Plan Note (Addendum)
recheck dysfunction today keep followup with nephrology and try to control volume Loss from the bowel by improving ulcerative colitis

## 2013-11-17 NOTE — Assessment & Plan Note (Addendum)
Still not under control Any prednisone 40 mg daily He will return for lab checks and we'll plan for a PPD of hepatitis B surface antigen. I think biologic therapy is going to make the most sense for this man. Also need to consider concomitant immunomodulators but am concerned he will not work fast enough to control things.  He does not have signs of infection on recent hospitalization C. difficile B. were negative. I've reviewed risks and benefits of immunomodulators and biologic 7 provided handouts.  Followup will be arranged if he does not have an already on the books.  Lomotil for symptom control as of loperamide.  I think this is ulcerative colitis but IBD serology panel will be done, he could have Crohn's disease, and surgery as a treatment for ulcerative colitis but not the same for Crohn's colitis so I think given now he has progressed it would be important to know that in case we need to come to surgery, he has thought about that and is willing to have a colostomy relatively like this if we cannot control things with medication.

## 2013-11-17 NOTE — Patient Instructions (Addendum)
Your physician has requested that you go to the basement for lab work, please come to the lab between 7:30 am and 5:30pm.  No appointment needed.  Today we are giving you handouts to read on Biologic therapies.  Come to our office this week to get a PPD test done, please call and make an appointment for this.  Call us back because we are currently awaiting PPD shipment.  Avoid liquid protein drinks but try to use protein powder supplements.    I appreciate the opportunity to care for you.

## 2013-11-18 ENCOUNTER — Encounter: Payer: Self-pay | Admitting: Internal Medicine

## 2013-11-18 NOTE — Progress Notes (Signed)
Subjective:    Patient ID: Sean Pope, male    DOB: 04/13/1946, 68 y.o.   MRN: [id number]  HPI Sean Pope is here after hospitalization for acute kidney injury and ulcerative colitis flare. He has not tolerated mesalamine and has not controlled his problems and it may have contributed to acute injury injury versus dehydration associated colitis flare. He is better on prednisone 40 mg daily but still having frequent diarrhea. Imodium is not helping. He has lost weight. He's had lower extremity edema which is improved, nephrology is helping with diuretic management and he has followup there later this month for his renal insufficiency. He has had a TPMT enzyme test performed he has normal function of this enzyme.  He is malnourished, he has tried some liquid supplements but they tend to cause diarrhea. He is working, he wears depends just in case but he is making it to the bathroom most times. 6-10 stools a day in a 24-hour period Allergies  Allergen Reactions  . No Known Allergies    Outpatient Prescriptions Prior to Visit  Medication Sig Dispense Refill  . calcium carbonate (TUMS - DOSED IN MG ELEMENTAL CALCIUM) 500 MG chewable tablet Chew 1 tablet (200 mg of elemental calcium total) by mouth 3 (three) times daily as needed for indigestion or heartburn.  30 tablet  0  . famotidine (PEPCID) 10 MG tablet Take 1 tablet (10 mg total) by mouth 2 (two) times daily before a meal.  60 tablet  0  . Ferrous Sulfate (IRON) 325 (65 FE) MG TABS Take 1 tablet by mouth 2 (two) times daily.       . Multiple Vitamins-Minerals (MENS MULTIVITAMIN PLUS PO) Take 1 tablet by mouth.      . potassium chloride 20 MEQ TBCR Take 10 mEq by mouth daily.  30 tablet  0  . predniSONE (DELTASONE) 20 MG tablet Take 2 tablets (40 mg total) by mouth daily with breakfast.  60 tablet  0  . simvastatin (ZOCOR) 40 MG tablet Take 40 mg by mouth every evening.      . furosemide (LASIX) 40 MG tablet Take 1 tablet (40 mg  total) by mouth daily.  30 tablet  0  . Loperamide HCl (IMODIUM A-D) 1 MG/7.5ML LIQD Take 30 mLs by mouth as needed.       No facility-administered medications prior to visit.   Past Medical History  Diagnosis Date  . Stroke   . SAH (subarachnoid hemorrhage)   . Hypertension   . Hyperlipidemia   . Lung collapse 09/26/2011  . Acute respiratory failure with hypoxia 09/17/2011  . 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 Ulcerative colitis 05/28/2013  . AKI (acute kidney injury)     In the setting of ulcerative colitis flare, question relationship to mesalamine   Past Surgical History  Procedure Laterality Date  . Aneurysm coiling  2013    Posterior communicating  . Colonoscopy w/ biopsies  05/28/2013   History   Social History  . Marital Status: Married    Spouse Name: N/A    Number of Children: 2  . Years of Education: N/A   Occupational History  . Director    Social History Main Topics  . Smoking status: Former Smoker -- 0.50 packs/day for 50 years    Types: Cigarettes  . Smokeless tobacco: Never Used  . Alcohol Use: Yes     Comment: rare  . Drug Use: No  . Sexual Activity:  No   Other Topics Concern  . None   Social History Narrative   Married, 1 son and 1 daughter   TEFL teacher   1.5 caffeine drinks daily   Family History  Problem Relation Age of Onset  . Heart disease Mother     Review of Systems As above    Objective:   Physical Exam General:  NAD but thinner than before Eyes:   anicteric Lungs:  clear Heart:  S1S2 no rubs, murmurs or gallops Abdomen:  soft and nontender, BS+ Ext:   1+ bilat LE edema    Data Reviewed:  Hospital records recent labs in the EMR    Assessment & Plan:   Ulcerative chronic pancolitis Still not under control Any prednisone 40 mg daily He will return for lab checks and we'll plan for a PPD of hepatitis B surface antigen. I think biologic therapy is going to make the most  sense for this man. Also need to consider concomitant immunomodulators but am concerned he will not work fast enough to control things.  He does not have signs of infection on recent hospitalization C. difficile B. were negative. I've reviewed risks and benefits of immunomodulators and biologic 7 provided handouts.  Followup will be arranged if he does not have an already on the books.  Lomotil for symptom control as of loperamide.  I think this is ulcerative colitis but IBD serology panel will be done, he could have Crohn's disease, and surgery as a treatment for ulcerative colitis but not the same for Crohn's colitis so I think given now he has progressed it would be important to know that in case we need to come to surgery, he has thought about that and is willing to have a colostomy relatively like this if we cannot control things with medication.  AKI (acute kidney injury) recheck dysfunction today keep followup with nephrology and try to control volume Loss from the bowel by improving ulcerative colitis  Protein-calorie malnutrition, severe Continue nutritional supplements try adding protein powder and uses puddings etc. instead of liquids that are osmotic and might worsen diarrhea.   I appreciate the opportunity to care for this patient.  CC: Tawanna Solo, MD  And Derwood Kaplan.D.

## 2013-11-18 NOTE — Assessment & Plan Note (Signed)
Continue nutritional supplements try adding protein powder and uses puddings etc. instead of liquids that are osmotic and might worsen diarrhea.

## 2013-11-20 ENCOUNTER — Telehealth: Payer: Self-pay | Admitting: Internal Medicine

## 2013-11-20 ENCOUNTER — Other Ambulatory Visit (INDEPENDENT_AMBULATORY_CARE_PROVIDER_SITE_OTHER): Payer: Medicare HMO

## 2013-11-20 DIAGNOSIS — N179 Acute kidney failure, unspecified: Secondary | ICD-10-CM

## 2013-11-20 DIAGNOSIS — K5100 Ulcerative (chronic) pancolitis without complications: Secondary | ICD-10-CM

## 2013-11-20 DIAGNOSIS — E43 Unspecified severe protein-calorie malnutrition: Secondary | ICD-10-CM

## 2013-11-20 LAB — CBC WITH DIFFERENTIAL/PLATELET
BASOS PCT: 0.1 % (ref 0.0–3.0)
Basophils Absolute: 0 10*3/uL (ref 0.0–0.1)
EOS ABS: 0.1 10*3/uL (ref 0.0–0.7)
Eosinophils Relative: 0.4 % (ref 0.0–5.0)
HCT: 33.7 % — ABNORMAL LOW (ref 39.0–52.0)
Hemoglobin: 11 g/dL — ABNORMAL LOW (ref 13.0–17.0)
LYMPHS PCT: 6.5 % — AB (ref 12.0–46.0)
Lymphs Abs: 0.8 10*3/uL (ref 0.7–4.0)
MCHC: 32.6 g/dL (ref 30.0–36.0)
MCV: 89.1 fl (ref 78.0–100.0)
MONO ABS: 0.5 10*3/uL (ref 0.1–1.0)
Monocytes Relative: 3.7 % (ref 3.0–12.0)
NEUTROS PCT: 89.3 % — AB (ref 43.0–77.0)
Neutro Abs: 10.9 10*3/uL — ABNORMAL HIGH (ref 1.4–7.7)
PLATELETS: 299 10*3/uL (ref 150.0–400.0)
RBC: 3.78 Mil/uL — AB (ref 4.22–5.81)
RDW: 16.3 % — ABNORMAL HIGH (ref 11.5–15.5)
WBC: 12.2 10*3/uL — ABNORMAL HIGH (ref 4.0–10.5)

## 2013-11-20 LAB — HEPATITIS B SURFACE ANTIGEN: Hepatitis B Surface Ag: NEGATIVE

## 2013-11-20 LAB — COMPREHENSIVE METABOLIC PANEL
ALBUMIN: 2.6 g/dL — AB (ref 3.5–5.2)
ALK PHOS: 79 U/L (ref 39–117)
ALT: 24 U/L (ref 0–53)
AST: 17 U/L (ref 0–37)
BUN: 35 mg/dL — ABNORMAL HIGH (ref 6–23)
CALCIUM: 8.2 mg/dL — AB (ref 8.4–10.5)
CHLORIDE: 101 meq/L (ref 96–112)
CO2: 27 mEq/L (ref 19–32)
Creatinine, Ser: 1.7 mg/dL — ABNORMAL HIGH (ref 0.4–1.5)
GFR: 42.2 mL/min — ABNORMAL LOW (ref >60.00)
Glucose, Bld: 105 mg/dL — ABNORMAL HIGH (ref 70–99)
POTASSIUM: 3.4 meq/L — AB (ref 3.5–5.1)
SODIUM: 134 meq/L — AB (ref 135–145)
TOTAL PROTEIN: 4.9 g/dL — AB (ref 6.0–8.3)
Total Bilirubin: 0.3 mg/dL (ref 0.2–1.2)

## 2013-11-20 LAB — HEPATITIS B SURFACE ANTIBODY,QUALITATIVE: HEP B S AB: NEGATIVE

## 2013-11-20 LAB — HEPATITIS B CORE ANTIBODY, TOTAL: Hep B Core Total Ab: NONREACTIVE

## 2013-11-20 LAB — HEPATITIS A ANTIBODY, TOTAL: Hep A Total Ab: NONREACTIVE

## 2013-11-20 NOTE — Telephone Encounter (Signed)
LM that we are currently out of PPD, so I will call him when it comes in to set up nurse appointment .

## 2013-11-20 NOTE — Progress Notes (Signed)
Quick Note:  Everything looking better Hgb up Creatinine down to 1.7 Please send these labs to Dr. Elmarie Shiley [facility]) also ______

## 2013-11-23 NOTE — Progress Notes (Signed)
Quick Note:  He is not immune to hepatitis A or B. and should be vaccinated ______

## 2013-11-23 NOTE — Telephone Encounter (Signed)
Spoke with patient today, he is going to call back later in the week to get PPD appointment set up and possibility Twinrix appointment, he wants to check his vaccine records first.

## 2013-11-24 LAB — IBD EXPANDED PANEL
ACCA: 6 U (ref 0–90)
ALCA: 2 U (ref 0–60)
AMCA: 3 U (ref 0–100)
Atypical pANCA: NEGATIVE
gASCA: 1 units (ref 0–50)

## 2013-11-26 NOTE — Progress Notes (Signed)
Quick Note:  1) IBD panel inconclusive - clinical dx ulcerative colitis still 2) Start Humira after PPD ok  3) He is naive to hep B and A so appropriate to get vaccinated when ready - not urgent - vaccine records not necessary 4) start 6 MP 75 mg daily also - CBC and CMET 2 weeks - can start 6 MP now 5) make an appt to see me or App with me early-mid July (before my vacation) 6) how is he? - diarrhea/bleeding, weight ______

## 2013-11-30 ENCOUNTER — Ambulatory Visit (INDEPENDENT_AMBULATORY_CARE_PROVIDER_SITE_OTHER): Payer: Medicare HMO | Admitting: Internal Medicine

## 2013-11-30 ENCOUNTER — Other Ambulatory Visit: Payer: Self-pay

## 2013-11-30 DIAGNOSIS — K5120 Ulcerative (chronic) proctitis without complications: Secondary | ICD-10-CM

## 2013-11-30 DIAGNOSIS — Z23 Encounter for immunization: Secondary | ICD-10-CM

## 2013-11-30 DIAGNOSIS — Z7960 Long term (current) use of unspecified immunomodulators and immunosuppressants: Secondary | ICD-10-CM

## 2013-11-30 DIAGNOSIS — Z79899 Other long term (current) drug therapy: Secondary | ICD-10-CM

## 2013-11-30 MED ORDER — MERCAPTOPURINE 50 MG PO TABS: 75.0000 mg | ORAL_TABLET | Freq: Every day | ORAL | Status: DC

## 2013-11-30 MED ORDER — ADALIMUMAB 40 MG/0.8ML ~~LOC~~ AJKT: 40.0000 mg/kg/h | AUTO-INJECTOR | SUBCUTANEOUS | Status: DC

## 2013-11-30 MED ORDER — ADALIMUMAB 40 MG/0.8ML ~~LOC~~ AJKT: 160.0000 mg | AUTO-INJECTOR | Freq: Once | SUBCUTANEOUS | Status: DC

## 2013-12-03 LAB — TB SKIN TEST
Induration: 0 mm
TB Skin Test: NEGATIVE

## 2013-12-05 ENCOUNTER — Other Ambulatory Visit: Payer: Self-pay | Admitting: Internal Medicine

## 2013-12-07 ENCOUNTER — Ambulatory Visit (INDEPENDENT_AMBULATORY_CARE_PROVIDER_SITE_OTHER): Payer: Medicare HMO | Admitting: Internal Medicine

## 2013-12-07 DIAGNOSIS — N183 Chronic kidney disease, stage 3 unspecified: Secondary | ICD-10-CM

## 2013-12-07 DIAGNOSIS — Z23 Encounter for immunization: Secondary | ICD-10-CM

## 2013-12-07 NOTE — Patient Instructions (Signed)
Per Dr. Carlean Purl ok to order labs that Dr. Posey Pronto needs: Renal function panel and magnesium.

## 2013-12-08 ENCOUNTER — Other Ambulatory Visit: Payer: Self-pay

## 2013-12-08 ENCOUNTER — Other Ambulatory Visit (INDEPENDENT_AMBULATORY_CARE_PROVIDER_SITE_OTHER): Payer: Medicare HMO

## 2013-12-08 ENCOUNTER — Telehealth: Payer: Self-pay

## 2013-12-08 DIAGNOSIS — K5190 Ulcerative colitis, unspecified, without complications: Secondary | ICD-10-CM

## 2013-12-08 DIAGNOSIS — K51918 Ulcerative colitis, unspecified with other complication: Secondary | ICD-10-CM

## 2013-12-08 DIAGNOSIS — Z7960 Long term (current) use of unspecified immunomodulators and immunosuppressants: Secondary | ICD-10-CM

## 2013-12-08 DIAGNOSIS — N183 Chronic kidney disease, stage 3 unspecified: Secondary | ICD-10-CM

## 2013-12-08 DIAGNOSIS — Z79899 Other long term (current) drug therapy: Secondary | ICD-10-CM

## 2013-12-08 DIAGNOSIS — Z23 Encounter for immunization: Secondary | ICD-10-CM

## 2013-12-08 DIAGNOSIS — K5120 Ulcerative (chronic) proctitis without complications: Secondary | ICD-10-CM

## 2013-12-08 LAB — RENAL FUNCTION PANEL
ALBUMIN: 2.4 g/dL — AB (ref 3.5–5.2)
BUN: 36 mg/dL — ABNORMAL HIGH (ref 6–23)
CHLORIDE: 91 meq/L — AB (ref 96–112)
CO2: 29 mEq/L (ref 19–32)
Calcium: 8.4 mg/dL (ref 8.4–10.5)
Creatinine, Ser: 2.4 mg/dL — ABNORMAL HIGH (ref 0.4–1.5)
GFR: 29.15 mL/min — AB (ref >60.00)
GLUCOSE: 169 mg/dL — AB (ref 70–99)
PHOSPHORUS: 2.4 mg/dL (ref 2.3–4.6)
POTASSIUM: 3.3 meq/L — AB (ref 3.5–5.1)
Sodium: 131 mEq/L — ABNORMAL LOW (ref 135–145)

## 2013-12-08 LAB — CBC WITH DIFFERENTIAL/PLATELET
Basophils Absolute: 0 10*3/uL (ref 0.0–0.1)
Basophils Relative: 0 % (ref 0.0–3.0)
EOS ABS: 0 10*3/uL (ref 0.0–0.7)
Eosinophils Relative: 0.1 % (ref 0.0–5.0)
HEMATOCRIT: 30.6 % — AB (ref 39.0–52.0)
Hemoglobin: 9.9 g/dL — ABNORMAL LOW (ref 13.0–17.0)
Lymphocytes Relative: 4.4 % — ABNORMAL LOW (ref 12.0–46.0)
Lymphs Abs: 1 10*3/uL (ref 0.7–4.0)
MCHC: 32.3 g/dL (ref 30.0–36.0)
MCV: 90.4 fl (ref 78.0–100.0)
MONO ABS: 0.7 10*3/uL (ref 0.1–1.0)
Monocytes Relative: 3 % (ref 3.0–12.0)
Neutro Abs: 20.2 10*3/uL — ABNORMAL HIGH (ref 1.4–7.7)
Neutrophils Relative %: 92.5 % — ABNORMAL HIGH (ref 43.0–77.0)
PLATELETS: 514 10*3/uL — AB (ref 150.0–400.0)
RBC: 3.39 Mil/uL — ABNORMAL LOW (ref 4.22–5.81)
RDW: 16 % — ABNORMAL HIGH (ref 11.5–15.5)
WBC: 21.8 10*3/uL (ref 4.0–10.5)

## 2013-12-08 LAB — HEPATIC FUNCTION PANEL
ALK PHOS: 71 U/L (ref 39–117)
ALT: 29 U/L (ref 0–53)
AST: 23 U/L (ref 0–37)
Albumin: 2.4 g/dL — ABNORMAL LOW (ref 3.5–5.2)
Bilirubin, Direct: 0.1 mg/dL (ref 0.0–0.3)
Total Bilirubin: 0.6 mg/dL (ref 0.2–1.2)
Total Protein: 4.9 g/dL — ABNORMAL LOW (ref 6.0–8.3)

## 2013-12-08 LAB — MAGNESIUM: MAGNESIUM: 1.5 mg/dL (ref 1.5–2.5)

## 2013-12-08 NOTE — Telephone Encounter (Signed)
[name] has been approved.  I have spoken with [facility].  Patient is able to have the rx filled with [facility] and they have the current prescription on file [phone].  They will be in contact with the patient about obtaining payment and sending out the starter kit.  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

## 2013-12-09 NOTE — Progress Notes (Signed)
Quick Note:  Kidney function worse but not as bad in past WBC up from prednisone Am ccing Dr.Jay Patel from nephrology but fax to him also to be sure  Sean Pope said diarrhea was a bit better - I saw him in passing when he had these drawn - relay results and ask him to check in with me re Sxs early next week - to see if we can reduce prednisone ______

## 2013-12-17 ENCOUNTER — Telehealth: Payer: Self-pay

## 2013-12-17 NOTE — Telephone Encounter (Signed)
Message copied by [place], Emya Picado E on Thu Dec 17, 2013  9:04 AM ------      Message from: [place], Saheed Carrington E      Created: Mon Dec 07, 2013  1:11 PM       Call and remind him of 12/21/13 Twinrix injection appt for 8 AM. ------

## 2013-12-17 NOTE — Telephone Encounter (Signed)
Spoke with patient and reminded him of his appointment Monday for Twinrix.  He wants to go over his medicines when he comes in, some need refills and he wants to make sure he needs them all.  Hopefully Barbera Setters will be available to talk to him or we can write it down and ask Dr. Carlean Purl.

## 2013-12-21 ENCOUNTER — Ambulatory Visit (INDEPENDENT_AMBULATORY_CARE_PROVIDER_SITE_OTHER): Payer: Medicare HMO | Admitting: Internal Medicine

## 2013-12-21 ENCOUNTER — Telehealth: Payer: Self-pay

## 2013-12-21 DIAGNOSIS — Z23 Encounter for immunization: Secondary | ICD-10-CM

## 2013-12-21 MED ORDER — DIPHENOXYLATE-ATROPINE 2.5-0.025 MG PO TABS: 1.0000 | ORAL_TABLET | Freq: Three times a day (TID) | ORAL | Status: DC

## 2013-12-21 MED ORDER — FAMOTIDINE 10 MG PO TABS: 10.0000 mg | ORAL_TABLET | Freq: Two times a day (BID) | ORAL | Status: DC

## 2013-12-21 NOTE — Telephone Encounter (Signed)
Patient informed that refills sent in for Pepcid and Lomotil as requested.  [facility]  Did not need Prednisone rx at this time.

## 2013-12-21 NOTE — Telephone Encounter (Signed)
Message copied by [place], Ladonna Vanorder E on Mon Dec 21, 2013  5:17 PM ------      Message from: Silvano Rusk E      Created: Mon Dec 21, 2013  3:57 PM       Refill the famotidine for 6 months            He needs to continue prednisone at this dose until he hears otherwise      Refill prednisone if needs it      ----- Message -----         From: Amiel Mccaffrey E [place], [place]: 12/21/2013   3:09 PM           To: Gatha Mayer, MD            Patient came in today for his Twinrix #3 injection.  He needs refill on his famotidine 79m BID if you want him to continue.  He hopes to start his Humira injections next week.  He said the Lomotil is really helping.  He was asking if he will continue his prednisone 239mtablets , 2 tabs with breakfast once he starts his Humira injections.  Please advise Sir, thank you.       ------

## 2014-01-05 ENCOUNTER — Other Ambulatory Visit (INDEPENDENT_AMBULATORY_CARE_PROVIDER_SITE_OTHER): Payer: Medicare HMO

## 2014-01-05 ENCOUNTER — Ambulatory Visit (INDEPENDENT_AMBULATORY_CARE_PROVIDER_SITE_OTHER): Payer: Medicare HMO | Admitting: Internal Medicine

## 2014-01-05 ENCOUNTER — Encounter: Payer: Self-pay | Admitting: Internal Medicine

## 2014-01-05 VITALS — BP 130/80 | HR 76 | Ht 71.0 in | Wt 148.6 lb

## 2014-01-05 DIAGNOSIS — E43 Unspecified severe protein-calorie malnutrition: Secondary | ICD-10-CM

## 2014-01-05 DIAGNOSIS — Z7960 Long term (current) use of unspecified immunomodulators and immunosuppressants: Secondary | ICD-10-CM | POA: Insufficient documentation

## 2014-01-05 DIAGNOSIS — N179 Acute kidney failure, unspecified: Secondary | ICD-10-CM

## 2014-01-05 DIAGNOSIS — R609 Edema, unspecified: Secondary | ICD-10-CM

## 2014-01-05 DIAGNOSIS — K51011 Ulcerative (chronic) pancolitis with rectal bleeding: Secondary | ICD-10-CM

## 2014-01-05 DIAGNOSIS — Z79899 Other long term (current) drug therapy: Secondary | ICD-10-CM

## 2014-01-05 DIAGNOSIS — K5100 Ulcerative (chronic) pancolitis without complications: Secondary | ICD-10-CM

## 2014-01-05 DIAGNOSIS — D649 Anemia, unspecified: Secondary | ICD-10-CM | POA: Insufficient documentation

## 2014-01-05 HISTORY — DX: Long term (current) use of unspecified immunomodulators and immunosuppressants: Z79.60

## 2014-01-05 HISTORY — DX: Other long term (current) drug therapy: Z79.899

## 2014-01-05 HISTORY — DX: Anemia, unspecified: D64.9

## 2014-01-05 LAB — CBC WITH DIFFERENTIAL/PLATELET
BASOS ABS: 0 10*3/uL (ref 0.0–0.1)
Basophils Relative: 0.4 % (ref 0.0–3.0)
Eosinophils Absolute: 0.1 10*3/uL (ref 0.0–0.7)
Eosinophils Relative: 0.7 % (ref 0.0–5.0)
HEMATOCRIT: 28.6 % — AB (ref 39.0–52.0)
HEMOGLOBIN: 9.4 g/dL — AB (ref 13.0–17.0)
LYMPHS ABS: 2 10*3/uL (ref 0.7–4.0)
Lymphocytes Relative: 17.9 % (ref 12.0–46.0)
MCHC: 32.8 g/dL (ref 30.0–36.0)
MCV: 90 fl (ref 78.0–100.0)
MONO ABS: 1.1 10*3/uL — AB (ref 0.1–1.0)
MONOS PCT: 10.3 % (ref 3.0–12.0)
NEUTROS ABS: 7.9 10*3/uL — AB (ref 1.4–7.7)
Neutrophils Relative %: 70.7 % (ref 43.0–77.0)
Platelets: 546 10*3/uL — ABNORMAL HIGH (ref 150.0–400.0)
RBC: 3.18 Mil/uL — ABNORMAL LOW (ref 4.22–5.81)
RDW: 19.2 % — AB (ref 11.5–15.5)
WBC: 11.1 10*3/uL — ABNORMAL HIGH (ref 4.0–10.5)

## 2014-01-05 LAB — COMPREHENSIVE METABOLIC PANEL
ALK PHOS: 64 U/L (ref 39–117)
ALT: 41 U/L (ref 0–53)
AST: 29 U/L (ref 0–37)
Albumin: 2.6 g/dL — ABNORMAL LOW (ref 3.5–5.2)
BUN: 37 mg/dL — ABNORMAL HIGH (ref 6–23)
CO2: 31 meq/L (ref 19–32)
CREATININE: 2.7 mg/dL — AB (ref 0.4–1.5)
Calcium: 8.5 mg/dL (ref 8.4–10.5)
Chloride: 94 mEq/L — ABNORMAL LOW (ref 96–112)
GFR: 24.96 mL/min — AB (ref >60.00)
Glucose, Bld: 109 mg/dL — ABNORMAL HIGH (ref 70–99)
Potassium: 3 mEq/L — ABNORMAL LOW (ref 3.5–5.1)
Sodium: 135 mEq/L (ref 135–145)
Total Bilirubin: 0.5 mg/dL (ref 0.2–1.2)
Total Protein: 5.6 g/dL — ABNORMAL LOW (ref 6.0–8.3)

## 2014-01-05 LAB — PHOSPHORUS: Phosphorus: 3.2 mg/dL (ref 2.3–4.6)

## 2014-01-05 NOTE — Assessment & Plan Note (Signed)
Multifactorial with low albumin, kidney injury. Remains on low-dose furosemide. We'll need to see what his creatinine is.

## 2014-01-05 NOTE — Progress Notes (Signed)
   Subjective:    Patient ID: Leavy Heatherly, male    DOB: 06/09/1946, 68 y.o.   MRN: [id number]  HPI  Vanetta Shawl is here for followup of ulcerative colitis. He is improved overall. 2 was stools a day and 2 at night Ran out of prednisone and did not refill a couple of weeks ago. He has had his initial dose of Humira. Using Lomotil 1-4 times a day with good symptom control allowing him to work. Edema persists in lower extremities Wt Readings from Last 3 Encounters:  01/05/14 148 lb 9.6 oz (67.405 kg)  11/17/13 147 lb (66.679 kg)  10/31/13 152 lb 8 oz (69.174 kg)  Due to see renal later this month [employer] Continues to work No abd pain  Medications, allergies, past medical history, past surgical history, family history and social history are reviewed and updated in the EMR.   Review of Systems     Objective:   Physical Exam General:  NAD Eyes:   anicteric Lungs:  clear Heart:  S1S2 no rubs, murmurs or gallops Abdomen:  soft and nontender, BS+ Ext:   2+ edema above ankle    Data Reviewed:  Review of labs in the EMR     Chemistry      Component Value Date/Time   NA 131* 12/08/2013 1714   K 3.3* 12/08/2013 1714   CL 91* 12/08/2013 1714   CO2 29 12/08/2013 1714   BUN 36* 12/08/2013 1714   CREATININE 2.4* 12/08/2013 1714      Component Value Date/Time   CALCIUM 8.4 12/08/2013 1714   ALKPHOS 71 12/08/2013 0747   AST 23 12/08/2013 0747   ALT 29 12/08/2013 0747   BILITOT 0.6 12/08/2013 0747     Lab Results  Component Value Date   WBC 21.8 cH* 12/08/2013   HGB 9.9* 12/08/2013   HCT 30.6* 12/08/2013   MCV 90.4 12/08/2013   PLT 514.0* 12/08/2013       Assessment & Plan:  Ulcerative chronic pancolitis Improved this point. He is on mercaptopurine and Humira and we'll continue these. I don't see any signs of toxicity. He stop his prednisone without a taper and seems to have tolerated that so given his overall clinical situation I think we can stay off of that at this point. He  may need to go back on it and I advised that that is the medication that should be tapered if so. I plan to see him back in 6 weeks.   AKI (acute kidney injury) Recheck labs and will forward to nephrology   Protein-calorie malnutrition, severe Recheck labs   Long-term use of immunosuppressant medication-mercaptopurine and Humira No overt signs of toxicity will check liver function tests and CBC today  Edema Multifactorial with low albumin, kidney injury. Remains on low-dose furosemide. We'll need to see what his creatinine is.   CC: Tawanna Solo, MD will also copy Dr. Elmarie Shiley

## 2014-01-05 NOTE — Patient Instructions (Addendum)
Your physician has requested that you go to the basement for the following lab work before leaving today: CMET, CBC, Phosphorus  We want to see you back in 6 weeks.  I will call you and set this up with you.  I appreciate the opportunity to care for you.

## 2014-01-05 NOTE — Assessment & Plan Note (Signed)
No overt signs of toxicity will check liver function tests and CBC today

## 2014-01-05 NOTE — Assessment & Plan Note (Signed)
Recheck labs 

## 2014-01-05 NOTE — Assessment & Plan Note (Addendum)
Improved this point. He is on mercaptopurine and Humira and we'll continue these. I don't see any signs of toxicity. He stop his prednisone without a taper and seems to have tolerated that so given his overall clinical situation I think we can stay off of that at this point. He may need to go back on it and I advised that that is the medication that should be tapered if so. I plan to see him back in 6 weeks.

## 2014-01-05 NOTE — Assessment & Plan Note (Addendum)
Recheck labs and will forward to nephrology

## 2014-01-06 ENCOUNTER — Other Ambulatory Visit: Payer: Self-pay

## 2014-01-06 MED ORDER — POTASSIUM CHLORIDE ER 20 MEQ PO TBCR: 20.0000 meq | EXTENDED_RELEASE_TABLET | Freq: Every day | ORAL | Status: DC

## 2014-01-06 NOTE — Progress Notes (Signed)
Quick Note:  Kidney fx worse and potassium low This needs to get faxed to Dr. Elmarie Shiley at [facility] please Start KCL 20 meQ daily also # 30 1 refill ______

## 2014-01-07 ENCOUNTER — Emergency Department (HOSPITAL_COMMUNITY): Payer: Managed Care, Other (non HMO)

## 2014-01-07 ENCOUNTER — Inpatient Hospital Stay (HOSPITAL_COMMUNITY)
Admission: EM | Admit: 2014-01-07 | Discharge: 2014-01-10 | DRG: 193 | Disposition: A | Discharge status: Home / Self Care | Payer: Managed Care, Other (non HMO) | Attending: Internal Medicine | Admitting: Internal Medicine

## 2014-01-07 ENCOUNTER — Telehealth: Payer: Self-pay | Admitting: Internal Medicine

## 2014-01-07 ENCOUNTER — Encounter (HOSPITAL_COMMUNITY): Payer: Self-pay | Admitting: Emergency Medicine

## 2014-01-07 DIAGNOSIS — Z79899 Other long term (current) drug therapy: Secondary | ICD-10-CM

## 2014-01-07 DIAGNOSIS — J9600 Acute respiratory failure, unspecified whether with hypoxia or hypercapnia: Secondary | ICD-10-CM

## 2014-01-07 DIAGNOSIS — K5190 Ulcerative colitis, unspecified, without complications: Secondary | ICD-10-CM | POA: Diagnosis present

## 2014-01-07 DIAGNOSIS — IMO0002 Reserved for concepts with insufficient information to code with codable children: Secondary | ICD-10-CM

## 2014-01-07 DIAGNOSIS — J438 Other emphysema: Secondary | ICD-10-CM | POA: Diagnosis present

## 2014-01-07 DIAGNOSIS — K589 Irritable bowel syndrome without diarrhea: Secondary | ICD-10-CM | POA: Diagnosis present

## 2014-01-07 DIAGNOSIS — R0902 Hypoxemia: Secondary | ICD-10-CM

## 2014-01-07 DIAGNOSIS — N183 Chronic kidney disease, stage 3 unspecified: Secondary | ICD-10-CM | POA: Diagnosis present

## 2014-01-07 DIAGNOSIS — K51019 Ulcerative (chronic) pancolitis with unspecified complications: Secondary | ICD-10-CM

## 2014-01-07 DIAGNOSIS — D500 Iron deficiency anemia secondary to blood loss (chronic): Secondary | ICD-10-CM

## 2014-01-07 DIAGNOSIS — J9601 Acute respiratory failure with hypoxia: Secondary | ICD-10-CM

## 2014-01-07 DIAGNOSIS — E43 Unspecified severe protein-calorie malnutrition: Secondary | ICD-10-CM

## 2014-01-07 DIAGNOSIS — Z8249 Family history of ischemic heart disease and other diseases of the circulatory system: Secondary | ICD-10-CM

## 2014-01-07 DIAGNOSIS — D649 Anemia, unspecified: Secondary | ICD-10-CM | POA: Diagnosis present

## 2014-01-07 DIAGNOSIS — E785 Hyperlipidemia, unspecified: Secondary | ICD-10-CM | POA: Diagnosis present

## 2014-01-07 DIAGNOSIS — R609 Edema, unspecified: Secondary | ICD-10-CM

## 2014-01-07 DIAGNOSIS — Z7960 Long term (current) use of unspecified immunomodulators and immunosuppressants: Secondary | ICD-10-CM

## 2014-01-07 DIAGNOSIS — K219 Gastro-esophageal reflux disease without esophagitis: Secondary | ICD-10-CM

## 2014-01-07 DIAGNOSIS — N179 Acute kidney failure, unspecified: Secondary | ICD-10-CM

## 2014-01-07 DIAGNOSIS — K5100 Ulcerative (chronic) pancolitis without complications: Secondary | ICD-10-CM

## 2014-01-07 DIAGNOSIS — J189 Pneumonia, unspecified organism: Principal | ICD-10-CM

## 2014-01-07 DIAGNOSIS — Z87891 Personal history of nicotine dependence: Secondary | ICD-10-CM

## 2014-01-07 DIAGNOSIS — I129 Hypertensive chronic kidney disease with stage 1 through stage 4 chronic kidney disease, or unspecified chronic kidney disease: Secondary | ICD-10-CM | POA: Diagnosis present

## 2014-01-07 DIAGNOSIS — Z8673 Personal history of transient ischemic attack (TIA), and cerebral infarction without residual deficits: Secondary | ICD-10-CM

## 2014-01-07 HISTORY — DX: Acute respiratory failure, unspecified whether with hypoxia or hypercapnia: J96.00

## 2014-01-07 LAB — COMPREHENSIVE METABOLIC PANEL
ALT: 42 U/L (ref 0–53)
AST: 38 U/L — ABNORMAL HIGH (ref 0–37)
Albumin: 2.2 g/dL — ABNORMAL LOW (ref 3.5–5.2)
Alkaline Phosphatase: 71 U/L (ref 39–117)
Anion gap: 13 (ref 5–15)
BILIRUBIN TOTAL: 0.3 mg/dL (ref 0.3–1.2)
BUN: 47 mg/dL — ABNORMAL HIGH (ref 6–23)
CHLORIDE: 93 meq/L — AB (ref 96–112)
CO2: 29 meq/L (ref 19–32)
Calcium: 8.8 mg/dL (ref 8.4–10.5)
Creatinine, Ser: 2.41 mg/dL — ABNORMAL HIGH (ref 0.50–1.35)
GFR, EST AFRICAN AMERICAN: 30 mL/min — AB (ref >90)
GFR, EST NON AFRICAN AMERICAN: 26 mL/min — AB (ref >90)
GLUCOSE: 136 mg/dL — AB (ref 70–99)
Potassium: 3.5 mEq/L — ABNORMAL LOW (ref 3.7–5.3)
Sodium: 135 mEq/L — ABNORMAL LOW (ref 137–147)
Total Protein: 5.5 g/dL — ABNORMAL LOW (ref 6.0–8.3)

## 2014-01-07 LAB — I-STAT TROPONIN, ED: Troponin i, poc: 0.04 ng/mL (ref 0.00–0.08)

## 2014-01-07 LAB — CBC WITH DIFFERENTIAL/PLATELET
BASOS ABS: 0.1 10*3/uL (ref 0.0–0.1)
Basophils Relative: 1 % (ref 0–1)
Eosinophils Absolute: 0 10*3/uL (ref 0.0–0.7)
Eosinophils Relative: 0 % (ref 0–5)
HCT: 27 % — ABNORMAL LOW (ref 39.0–52.0)
Hemoglobin: 8.9 g/dL — ABNORMAL LOW (ref 13.0–17.0)
LYMPHS ABS: 1.5 10*3/uL (ref 0.7–4.0)
LYMPHS PCT: 16 % (ref 12–46)
MCH: 29.2 pg (ref 26.0–34.0)
MCHC: 33 g/dL (ref 30.0–36.0)
MCV: 88.5 fL (ref 78.0–100.0)
MONO ABS: 1 10*3/uL (ref 0.1–1.0)
Monocytes Relative: 11 % (ref 3–12)
NEUTROS ABS: 6.7 10*3/uL (ref 1.7–7.7)
Neutrophils Relative %: 72 % (ref 43–77)
Platelets: 522 10*3/uL — ABNORMAL HIGH (ref 150–400)
RBC: 3.05 MIL/uL — AB (ref 4.22–5.81)
RDW: 17.5 % — AB (ref 11.5–15.5)
WBC: 9.2 10*3/uL (ref 4.0–10.5)

## 2014-01-07 LAB — PRO B NATRIURETIC PEPTIDE: Pro B Natriuretic peptide (BNP): 635.8 pg/mL — ABNORMAL HIGH (ref 0–125)

## 2014-01-07 MED ORDER — HYDROCODONE-ACETAMINOPHEN 5-325 MG PO TABS: 1.0000 | ORAL_TABLET | ORAL | Status: DC | PRN

## 2014-01-07 MED ORDER — VANCOMYCIN HCL IN DEXTROSE 750-5 MG/150ML-% IV SOLN
750.0000 mg | INTRAVENOUS | Status: DC
  Administered 2014-01-07 – 2014-01-08 (×2): 750 mg via INTRAVENOUS
  Filled 2014-01-07 (×2): qty 150

## 2014-01-07 MED ORDER — BOOST / RESOURCE BREEZE PO LIQD
1.0000 | Freq: Three times a day (TID) | ORAL | Status: DC
  Administered 2014-01-07 – 2014-01-09 (×4): 1 via ORAL
  Filled 2014-01-07 (×2): qty 1

## 2014-01-07 MED ORDER — ALBUTEROL SULFATE (2.5 MG/3ML) 0.083% IN NEBU
2.5000 mg | INHALATION_SOLUTION | Freq: Four times a day (QID) | RESPIRATORY_TRACT | Status: DC
  Administered 2014-01-07: 2.5 mg via RESPIRATORY_TRACT
  Filled 2014-01-07: qty 3

## 2014-01-07 MED ORDER — FAMOTIDINE 20 MG PO TABS
20.0000 mg | ORAL_TABLET | Freq: Every day | ORAL | Status: DC
  Administered 2014-01-07 – 2014-01-10 (×4): 20 mg via ORAL
  Filled 2014-01-07 (×4): qty 1

## 2014-01-07 MED ORDER — ALBUTEROL SULFATE (2.5 MG/3ML) 0.083% IN NEBU: 2.5000 mg | INHALATION_SOLUTION | RESPIRATORY_TRACT | Status: DC | PRN

## 2014-01-07 MED ORDER — SODIUM CHLORIDE 0.9 % IJ SOLN
3.0000 mL | Freq: Two times a day (BID) | INTRAMUSCULAR | Status: DC
Start: 2014-01-07 — End: 2014-01-10
  Administered 2014-01-08 – 2014-01-10 (×4): 3 mL via INTRAVENOUS

## 2014-01-07 MED ORDER — FUROSEMIDE 80 MG PO TABS
80.0000 mg | ORAL_TABLET | Freq: Every day | ORAL | Status: DC
  Administered 2014-01-08 – 2014-01-10 (×3): 80 mg via ORAL
  Filled 2014-01-07 (×3): qty 1

## 2014-01-07 MED ORDER — ONDANSETRON HCL 4 MG PO TABS: 4.0000 mg | ORAL_TABLET | Freq: Four times a day (QID) | ORAL | Status: DC | PRN

## 2014-01-07 MED ORDER — IRON 325 (65 FE) MG PO TABS: 1.0000 | ORAL_TABLET | Freq: Two times a day (BID) | ORAL | Status: DC

## 2014-01-07 MED ORDER — GUAIFENESIN ER 600 MG PO TB12
600.0000 mg | ORAL_TABLET | Freq: Two times a day (BID) | ORAL | Status: DC
  Administered 2014-01-07 – 2014-01-10 (×6): 600 mg via ORAL
  Filled 2014-01-07 (×7): qty 1

## 2014-01-07 MED ORDER — SIMVASTATIN 40 MG PO TABS
40.0000 mg | ORAL_TABLET | Freq: Every evening | ORAL | Status: DC
  Administered 2014-01-07 – 2014-01-09 (×3): 40 mg via ORAL
  Filled 2014-01-07 (×4): qty 1

## 2014-01-07 MED ORDER — HEPARIN SODIUM (PORCINE) 5000 UNIT/ML IJ SOLN
5000.0000 [IU] | Freq: Three times a day (TID) | INTRAMUSCULAR | Status: DC
  Administered 2014-01-07 – 2014-01-10 (×8): 5000 [IU] via SUBCUTANEOUS
  Filled 2014-01-07 (×11): qty 1

## 2014-01-07 MED ORDER — ACETAMINOPHEN 325 MG PO TABS: 650.0000 mg | ORAL_TABLET | Freq: Four times a day (QID) | ORAL | Status: DC | PRN

## 2014-01-07 MED ORDER — ACETAMINOPHEN 650 MG RE SUPP: 650.0000 mg | Freq: Four times a day (QID) | RECTAL | Status: DC | PRN

## 2014-01-07 MED ORDER — DEXTROSE 5 % IV SOLN
1.0000 g | INTRAVENOUS | Status: DC
  Administered 2014-01-07 – 2014-01-09 (×3): 1 g via INTRAVENOUS
  Filled 2014-01-07 (×4): qty 1

## 2014-01-07 MED ORDER — FERROUS SULFATE 325 (65 FE) MG PO TABS
325.0000 mg | ORAL_TABLET | Freq: Two times a day (BID) | ORAL | Status: DC
  Administered 2014-01-07 – 2014-01-10 (×6): 325 mg via ORAL
  Filled 2014-01-07 (×8): qty 1

## 2014-01-07 MED ORDER — SODIUM CHLORIDE 0.9 % IJ SOLN: 3.0000 mL | INTRAMUSCULAR | Status: DC | PRN

## 2014-01-07 MED ORDER — ONDANSETRON HCL 4 MG/2ML IJ SOLN: 4.0000 mg | Freq: Four times a day (QID) | INTRAMUSCULAR | Status: DC | PRN

## 2014-01-07 MED ORDER — POTASSIUM CHLORIDE CRYS ER 20 MEQ PO TBCR
40.0000 meq | EXTENDED_RELEASE_TABLET | Freq: Once | ORAL | Status: AC
  Administered 2014-01-07: 40 meq via ORAL
  Filled 2014-01-07: qty 2

## 2014-01-07 MED ORDER — SODIUM CHLORIDE 0.9 % IV SOLN: 250.0000 mL | INTRAVENOUS | Status: DC | PRN

## 2014-01-07 MED ORDER — ALBUTEROL SULFATE HFA 108 (90 BASE) MCG/ACT IN AERS
4.0000 | INHALATION_SPRAY | Freq: Once | RESPIRATORY_TRACT | Status: AC
  Administered 2014-01-07: 4 via RESPIRATORY_TRACT
  Filled 2014-01-07: qty 6.7

## 2014-01-07 NOTE — ED Provider Notes (Signed)
CSN: [id number]     Arrival date & time 01/07/14  1114 History   First MD Initiated Contact with Patient 01/07/14 1131     Chief Complaint  Patient presents with  . Shortness of Breath     (Consider location/radiation/quality/duration/timing/severity/associated sxs/prior Treatment) Patient is a 68 y.o. male presenting with shortness of breath.  Shortness of Breath Severity:  Severe Onset quality:  Gradual Duration:  1 week Timing:  Constant Progression:  Worsening Chronicity:  New Context comment:  Started taking humira a week ago Relieved by:  Nothing Exacerbated by: laying flat, exertion, even minimal. Associated symptoms: cough (about a week ago for a few hours, then resolved. )   Associated symptoms: no abdominal pain, no chest pain, no fever, no headaches, no sputum production and no vomiting     Past Medical History  Diagnosis Date  . Stroke   . SAH (subarachnoid hemorrhage)   . Hypertension   . Hyperlipidemia   . Lung collapse 09/26/2011  . Acute respiratory failure with hypoxia 09/17/2011  . 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 Ulcerative colitis 05/28/2013  . AKI (acute kidney injury)     In the setting of ulcerative colitis flare, question relationship to mesalamine  . Long-term use of immunosuppressant medication-mercaptopurine and Humira 01/05/2014  . Anemia-multifactorial 01/05/2014    Blood loss, medications, chronic illness kidney injury   Past Surgical History  Procedure Laterality Date  . Aneurysm coiling  2013    Posterior communicating  . Colonoscopy w/ biopsies  05/28/2013   Family History  Problem Relation Age of Onset  . Heart disease Mother    History  Substance Use Topics  . Smoking status: Former Smoker -- 0.50 packs/day for 50 years    Types: Cigarettes  . Smokeless tobacco: Never Used  . Alcohol Use: Yes     Comment: rare    Review of Systems  Constitutional: Negative for fever.  HENT: Negative for congestion.    Respiratory: Positive for cough (about a week ago for a few hours, then resolved. ) and shortness of breath. Negative for sputum production.   Cardiovascular: Negative for chest pain.  Gastrointestinal: Negative for nausea, vomiting, abdominal pain and diarrhea.  Neurological: Negative for headaches.  All other systems reviewed and are negative.     Allergies  Review of patient's allergies indicates no known allergies.  Home Medications   Prior to Admission medications   Medication Sig Start Date End Date Taking? Authorizing Provider  Adalimumab (HUMIRA PEN) 40 MG/0.8ML PNKT Inject 40 mg elemental calcium/kg/hr into the skin every 14 (fourteen) days. Inject 40 mg into skin every other week after the completion of the starter kit 11/30/13  Yes Gatha Mayer, MD  famotidine (PEPCID) 20 MG tablet Take 1 tablet by mouth daily. 10/31/13  Yes Historical Provider, MD  Ferrous Sulfate (IRON) 325 (65 FE) MG TABS Take 1 tablet by mouth 2 (two) times daily.    Yes Historical Provider, MD  furosemide (LASIX) 40 MG tablet Take 80 mg by mouth daily. 10/31/13  Yes Robbie Lis, MD  Multiple Vitamins-Minerals (MENS MULTIVITAMIN PLUS PO) Take 1 tablet by mouth.   Yes Historical Provider, MD  potassium chloride 20 MEQ TBCR Take 20 mEq by mouth daily. 01/06/14  Yes Gatha Mayer, MD  simvastatin (ZOCOR) 40 MG tablet Take 40 mg by mouth every evening.   Yes Historical Provider, MD   BP 148/79  Pulse 92  Temp(Src) 100.4 F (38 C) (Oral)  Resp 20  SpO2 81% Physical Exam  Nursing note and vitals reviewed. Constitutional: He is oriented to person, place, and time. He appears well-developed and well-nourished. No distress.  HENT:  Head: Normocephalic and atraumatic.  Mouth/Throat: Oropharynx is clear and moist.  Eyes: Conjunctivae are normal. Pupils are equal, round, and reactive to light. No scleral icterus.  Neck: Neck supple.  Cardiovascular: Normal rate, regular rhythm, normal heart sounds and  intact distal pulses.   No murmur heard. Pulmonary/Chest: Effort normal. No stridor. No respiratory distress. He has decreased breath sounds (decreased air movement particularly on left). He has wheezes (faint expiratory). He has no rales.  Abdominal: Soft. He exhibits no distension. There is no tenderness.  Musculoskeletal: Normal range of motion. He exhibits edema (2+ pitting  to knees bilaterally).  Neurological: He is alert and oriented to person, place, and time.  Skin: Skin is warm and dry. No rash noted.  Psychiatric: He has a normal mood and affect. His behavior is normal.    ED Course  Procedures (including critical care time) Labs Review Labs Reviewed  CBC WITH DIFFERENTIAL - Abnormal; Notable for the following:    RBC 3.05 (*)    Hemoglobin 8.9 (*)    HCT 27.0 (*)    RDW 17.5 (*)    Platelets 522 (*)    All other components within normal limits  COMPREHENSIVE METABOLIC PANEL  I-STAT TROPOININ, ED    Imaging Review Dg Chest 2 View  01/07/2014   CLINICAL DATA:  SHORTNESS OF BREATH  EXAM: CHEST  2 VIEW  COMPARISON:  Prior radiograph from 10/26/2013  FINDINGS: The cardiac and mediastinal silhouettes are stable in size and contour, and remain within normal limits.  The lungs are normal hyperinflated with attenuation of the pulmonary markings, greatest within the lung bases. Findings are consistent with emphysema. No airspace consolidation, pleural effusion, or pulmonary edema is identified. There is no pneumothorax.  There is mild accentuation of the normal thoracic kyphosis with multilevel degenerative changes seen within the visualized spine. No acute osseus abnormality.  IMPRESSION: 1. No active cardiopulmonary disease. 2. Emphysema.   Electronically Signed   By: Jeannine Boga M.D.   On: 01/07/2014 12:18  All radiology studies independently viewed by me.      EKG Interpretation   Date/Time:  Thursday January 07 2014 11:37:36 EDT Ventricular Rate:  88 PR Interval:   134 QRS Duration: 84 QT Interval:  347 QTC Calculation: 420 R Axis:   48 Text Interpretation:  Sinus rhythm Ventricular premature complex No  significant change was found Confirmed by [facility]  MD, TREY ([id number]) on  01/07/2014 11:41:18 AM      MDM   Final diagnoses:  Hypoxia   68 yo male presenting with SOB.  Hypoxic on arrival.  On supplemental O2, in no distress.  Lung sounds decreased on left.  Immunocompromised with low grade temp.  CXR negative.  Due to fever, immunocompromised state, will treat for pneumonia.  Admitted to internal medicine.      Houston Siren III, MD 01/08/14 [phone]

## 2014-01-07 NOTE — H&P (Signed)
[facility] History and Physical  Edrian Melucci ZWC:[id number] DOB: 1946/05/21 DOA: 01/07/2014  Referring physician: Dr Doy Mince PCP: Silvano Rusk, MD   Chief Complaint: SOB.   HPI: Sean Pope is a 68 y.o. male with PMH significant for Ulcerative colitis recently started on humira , malnutrition, CKD stage III Cr range 1.9 to 2.4, history of stroke who presents complaining of SOB on exertion, weakness since 1 week prior to admission. He also report  Productive cough for last 3 days. He had tempeture in the ED at 100.9. He denies weight or fluid gain. He only has persistent Lower extremities edema since January. He relates diarrhea is stable, not worse. He denies abdominal pain.  He takes 80 mg lasix in AM and 40 mg at night.  Evaluation in ED: chest x ray no active cardiopulmonary diseases, he was hypoxic initially 80 % on room air, now high 90 on 2 L. BNP no significantly elevated, Cr at 2.4.   Review of Systems:  Negative., except as per HPI.    Past Medical History  Diagnosis Date  . Stroke   . SAH (subarachnoid hemorrhage)   . Hypertension   . Hyperlipidemia   . Lung collapse 09/26/2011  . Acute respiratory failure with hypoxia 09/17/2011  . 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 Ulcerative colitis 05/28/2013  . AKI (acute kidney injury)     In the setting of ulcerative colitis flare, question relationship to mesalamine  . Long-term use of immunosuppressant medication-mercaptopurine and Humira 01/05/2014  . Anemia-multifactorial 01/05/2014    Blood loss, medications, chronic illness kidney injury   Past Surgical History  Procedure Laterality Date  . Aneurysm coiling  2013    Posterior communicating  . Colonoscopy w/ biopsies  05/28/2013   Social History:  reports that he has quit smoking. His smoking use included Cigarettes. He has a 25 pack-year smoking history. He has never used smokeless tobacco. He reports that he drinks alcohol. He reports that he does  not use illicit drugs.  No Known Allergies  Family History  Problem Relation Age of Onset  . Heart disease Mother      Prior to Admission medications   Medication Sig Start Date End Date Taking? Authorizing Provider  Adalimumab (HUMIRA PEN) 40 MG/0.8ML PNKT Inject 40 mg elemental calcium/kg/hr into the skin every 14 (fourteen) days. Inject 40 mg into skin every other week after the completion of the starter kit 11/30/13  Yes Gatha Mayer, MD  famotidine (PEPCID) 20 MG tablet Take 1 tablet by mouth daily. 10/31/13  Yes Historical Provider, MD  Ferrous Sulfate (IRON) 325 (65 FE) MG TABS Take 1 tablet by mouth 2 (two) times daily.    Yes Historical Provider, MD  furosemide (LASIX) 40 MG tablet Take 80 mg by mouth daily. 10/31/13  Yes Robbie Lis, MD  Multiple Vitamins-Minerals (MENS MULTIVITAMIN PLUS PO) Take 1 tablet by mouth.   Yes Historical Provider, MD  potassium chloride 20 MEQ TBCR Take 20 mEq by mouth daily. 01/06/14  Yes Gatha Mayer, MD  simvastatin (ZOCOR) 40 MG tablet Take 40 mg by mouth every evening.   Yes Historical Provider, MD   Physical Exam: Filed Vitals:   01/07/14 1230 01/07/14 1232 01/07/14 1311 01/07/14 1408  BP:  146/87  130/71  Pulse: 80 86  82  Temp:   100.9 F (38.3 C)   TempSrc:   Rectal   Resp: _0 SpO2: 100% 100%  97%    Wt  Readings from Last 3 Encounters:  01/05/14 67.405 kg (148 lb 9.6 oz)  11/17/13 66.679 kg (147 lb)  10/31/13 69.174 kg (152 lb 8 oz)    General:  Appears calm and comfortable Eyes: PERRL, normal lids, irises & conjunctiva ENT: grossly normal hearing, lips & tongue Neck: no LAD, masses or thyromegaly Cardiovascular: RRR, no m/r/g. Significant LE edema. Telemetry: SR, no arrhythmias  Respiratory:Normal respiratory effort. Bilateral expiratory wheezing, and ronchus.  Abdomen: soft, ntnd Skin: no rash or induration seen on limited exam Musculoskeletal: grossly normal tone BUE/BLE, significant BL lower extremities edema.  Plus 3.  Neurologic: grossly non-focal.          Labs on Admission:  Basic Metabolic Panel:  Recent Labs Lab 01/05/14 0957 01/07/14 1150  NA 135 135*  K 3.0* 3.5*  CL 94* 93*  CO2 31 29  GLUCOSE 109* 136*  BUN 37* 47*  CREATININE 2.7* 2.41*  CALCIUM 8.5 8.8  PHOS 3.2  --    Liver Function Tests:  Recent Labs Lab 01/05/14 0957 01/07/14 1150  AST 29 38*  ALT 41 42  ALKPHOS 64 71  BILITOT 0.5 0.3  PROT 5.6* 5.5*  ALBUMIN 2.6* 2.2*   No results found for this basename: LIPASE, AMYLASE,  in the last 168 hours No results found for this basename: AMMONIA,  in the last 168 hours CBC:  Recent Labs Lab 01/05/14 0957 01/07/14 1150  WBC 11.1* 9.2  NEUTROABS 7.9* 6.7  HGB 9.4* 8.9*  HCT 28.6* 27.0*  MCV 90.0 88.5  PLT 546.0* 522*   Cardiac Enzymes: No results found for this basename: CKTOTAL, CKMB, CKMBINDEX, TROPONINI,  in the last 168 hours  BNP (last 3 results)  Recent Labs  10/26/13 0930 01/07/14 1231  PROBNP 2729.0* 635.8*   CBG: No results found for this basename: GLUCAP,  in the last 168 hours  Radiological Exams on Admission: Dg Chest 2 View  01/07/2014   CLINICAL DATA:  SHORTNESS OF BREATH  EXAM: CHEST  2 VIEW  COMPARISON:  Prior radiograph from 10/26/2013  FINDINGS: The cardiac and mediastinal silhouettes are stable in size and contour, and remain within normal limits.  The lungs are normal hyperinflated with attenuation of the pulmonary markings, greatest within the lung bases. Findings are consistent with emphysema. No airspace consolidation, pleural effusion, or pulmonary edema is identified. There is no pneumothorax.  There is mild accentuation of the normal thoracic kyphosis with multilevel degenerative changes seen within the visualized spine. No acute osseus abnormality.  IMPRESSION: 1. No active cardiopulmonary disease. 2. Emphysema.   Electronically Signed   By: Jeannine Boga M.D.   On: 01/07/2014 12:18    EKG: Independently reviewed.  Sinus rhythm.   Assessment/Plan Active Problems:   Hypoxia   Respiratory failure, acute   1-Acute hypoxic Respiratory Failure; Patient with clinical symptoms for PNA. He relates productive cough, mild fever. Due to immune -suppressive status agree with IV antibiotics, cefepime and vancomycin. Will check strept and legionella antigen.  Agree with blood culture. He does not appears to be in pulmonary edema or heart failure, BNP lower than before. Will continue with Lower dose of lasix. Nebulizer treatments.   2-CKD stage III: Per prior labs Cr range 1.9 to 2.0. He takes 80 mg lasix in am and 40 at night. I will continue with 80 mg daily. Follow renal function. Adjust lasix as needed. Strict I and o. Check UA.   3-Bilateral LE edema; probably related to hypoalbuminemia: will check doppler rule out  DVT. If doppler negative will order stocking compression.   4-Ulcerative Colitis: recently started on humira. Will hold for now due to acute illness. Per Dr Carlean Purl note patient is also on Mercaptopurine, I don't see this medication on med list. Will ask pharmacy to review meds.   5-Anemia; could be multifactorial, prior history of iron deficiency. Will check anemia panel. Monitor.   Code Status: presume full code.  DVT Prophylaxis:Heparin.  Family Communication: Care discussed with wife who was at bedside.  Disposition Plan: expect 3 to 5 days depending on clinical improvement.   Time spent: 75 minutes.   Niel Hummer A [facility] Pager [phone]  **Disclaimer: This note may have been dictated with voice recognition software. Similar sounding words can inadvertently be transcribed and this note may contain transcription errors which may not have been corrected upon publication of note.**

## 2014-01-07 NOTE — Progress Notes (Signed)
Utilization Review completed.  Aishwarya Shiplett RN CM  

## 2014-01-07 NOTE — ED Notes (Signed)
Pt reports SOB x1 week. Started [place] treatment last Thursday and c/o sob since then. Sts it is constant, worse with exertion. Denies chest pain, dizziness, cough, congestion. Pt speaking in complete sentences, no obvious distress.

## 2014-01-07 NOTE — Progress Notes (Signed)
ANTIBIOTIC CONSULT NOTE - INITIAL  Pharmacy Consult for Vancomycin & Cefepime Indication: pneumonia  No Known Allergies  Patient Measurements: Total Body Weight: 67.4kg (01/05/14)  Vital Signs: Temp: 100.9 F (38.3 C) (07/23 1311) Temp src: Rectal (07/23 1311) BP: 146/87 mmHg (07/23 1232) Pulse Rate: 86 (07/23 1232) Intake/Output from previous day:   Intake/Output from this shift:    Labs:  Recent Labs  01/05/14 0957 01/07/14 1150  WBC 11.1* 9.2  HGB 9.4* 8.9*  PLT 546.0* 522*  CREATININE 2.7* 2.41*   The CrCl is unknown because both a height and weight (above a minimum accepted value) are required for this calculation. No results found for this basename: VANCOTROUGH, VANCOPEAK, VANCORANDOM, GENTTROUGH, GENTPEAK, GENTRANDOM, TOBRATROUGH, TOBRAPEAK, TOBRARND, AMIKACINPEAK, AMIKACINTROU, AMIKACIN,  in the last 72 hours   Microbiology: No results found for this or any previous visit (from the past 720 hour(s)).  Medical History: Past Medical History  Diagnosis Date  . Stroke   . SAH (subarachnoid hemorrhage)   . Hypertension   . Hyperlipidemia   . Lung collapse 09/26/2011  . Acute respiratory failure with hypoxia 09/17/2011  . 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 Ulcerative colitis 05/28/2013  . AKI (acute kidney injury)     In the setting of ulcerative colitis flare, question relationship to mesalamine  . Long-term use of immunosuppressant medication-mercaptopurine and Humira 01/05/2014  . Anemia-multifactorial 01/05/2014    Blood loss, medications, chronic illness kidney injury    Medications:  Scheduled:  Infusions:  PRN:    Assessment: 29 yom with PMH of Stroke, SAH, HTN, HLD, Ulcerative colitis, & AKI. Presented in ED with SOB x 1 week. [place] treatment last Thursday & c/o sob since then. Pharmacy consulted to dose Vanco & Cefepime for Pneumonia.   Tmax: 100.9 WBCs: 9.2 Renal: SCr 2.41 CrCl 27m/min (CG)  Goal of Therapy:  Vancomycin  trough level 15-20 mcg/ml Appropriate antibiotic dosing for renal function; eradication of infection  Plan:   Start Vancomycin 7563mIV Q24H  Start Cefepime 1g IV Q24H  Measure antibiotic drug levels at steady state  Follow up culture results  LaKizzie FurnishPharmD Pager: [phone]/23/2015 1:44 PM

## 2014-01-07 NOTE — Telephone Encounter (Signed)
Patient reports that he is having SOB, started on Monday and has gotten progressively worse.  He wants Dr. Carlean Purl to know that he is on the way to [facility] for evaluation.

## 2014-01-08 ENCOUNTER — Inpatient Hospital Stay (HOSPITAL_COMMUNITY): Payer: Managed Care, Other (non HMO)

## 2014-01-08 DIAGNOSIS — R609 Edema, unspecified: Secondary | ICD-10-CM

## 2014-01-08 DIAGNOSIS — J9600 Acute respiratory failure, unspecified whether with hypoxia or hypercapnia: Secondary | ICD-10-CM

## 2014-01-08 DIAGNOSIS — R0902 Hypoxemia: Secondary | ICD-10-CM

## 2014-01-08 DIAGNOSIS — K5100 Ulcerative (chronic) pancolitis without complications: Secondary | ICD-10-CM

## 2014-01-08 LAB — URINALYSIS, ROUTINE W REFLEX MICROSCOPIC
BILIRUBIN URINE: NEGATIVE
Glucose, UA: NEGATIVE mg/dL
Hgb urine dipstick: NEGATIVE
Ketones, ur: NEGATIVE mg/dL
LEUKOCYTES UA: NEGATIVE
NITRITE: NEGATIVE
PH: 5.5 (ref 5.0–8.0)
Protein, ur: NEGATIVE mg/dL
Specific Gravity, Urine: 1.02 (ref 1.005–1.030)
Urobilinogen, UA: 0.2 mg/dL (ref 0.0–1.0)

## 2014-01-08 LAB — CBC
HCT: 24.1 % — ABNORMAL LOW (ref 39.0–52.0)
HEMOGLOBIN: 8 g/dL — AB (ref 13.0–17.0)
MCH: 29.5 pg (ref 26.0–34.0)
MCHC: 33.2 g/dL (ref 30.0–36.0)
MCV: 88.9 fL (ref 78.0–100.0)
PLATELETS: 460 10*3/uL — AB (ref 150–400)
RBC: 2.71 MIL/uL — AB (ref 4.22–5.81)
RDW: 17.6 % — ABNORMAL HIGH (ref 11.5–15.5)
WBC: 9.5 10*3/uL (ref 4.0–10.5)

## 2014-01-08 LAB — IRON AND TIBC
Iron: <10 ug/dL — ABNORMAL LOW (ref 42–135)
UIBC: 113 ug/dL — ABNORMAL LOW (ref 125–400)

## 2014-01-08 LAB — BASIC METABOLIC PANEL
ANION GAP: 12 (ref 5–15)
BUN: 37 mg/dL — ABNORMAL HIGH (ref 6–23)
CHLORIDE: 97 meq/L (ref 96–112)
CO2: 27 mEq/L (ref 19–32)
Calcium: 8.5 mg/dL (ref 8.4–10.5)
Creatinine, Ser: 2.07 mg/dL — ABNORMAL HIGH (ref 0.50–1.35)
GFR, EST AFRICAN AMERICAN: 36 mL/min — AB (ref >90)
GFR, EST NON AFRICAN AMERICAN: 31 mL/min — AB (ref >90)
Glucose, Bld: 111 mg/dL — ABNORMAL HIGH (ref 70–99)
POTASSIUM: 3.7 meq/L (ref 3.7–5.3)
Sodium: 136 mEq/L — ABNORMAL LOW (ref 137–147)

## 2014-01-08 LAB — RETICULOCYTES
RBC.: 2.71 MIL/uL — ABNORMAL LOW (ref 4.22–5.81)
RETIC CT PCT: 2.5 % (ref 0.4–3.1)
Retic Count, Absolute: 67.8 10*3/uL (ref 19.0–186.0)

## 2014-01-08 LAB — STREP PNEUMONIAE URINARY ANTIGEN: Strep Pneumo Urinary Antigen: NEGATIVE

## 2014-01-08 LAB — LEGIONELLA ANTIGEN, URINE: Legionella Antigen, Urine: NEGATIVE

## 2014-01-08 LAB — FERRITIN: FERRITIN: 247 ng/mL (ref 22–322)

## 2014-01-08 LAB — VITAMIN B12: VITAMIN B 12: 661 pg/mL (ref 211–911)

## 2014-01-08 LAB — FOLATE

## 2014-01-08 MED ORDER — IPRATROPIUM-ALBUTEROL 0.5-2.5 (3) MG/3ML IN SOLN
3.0000 mL | Freq: Three times a day (TID) | RESPIRATORY_TRACT | Status: DC
  Administered 2014-01-08 – 2014-01-10 (×7): 3 mL via RESPIRATORY_TRACT
  Filled 2014-01-08 (×8): qty 3

## 2014-01-08 MED ORDER — TECHNETIUM TO 99M ALBUMIN AGGREGATED
5.0000 | Freq: Once | INTRAVENOUS | Status: AC | PRN
  Administered 2014-01-08: 5 via INTRAVENOUS

## 2014-01-08 MED ORDER — MESALAMINE 400 MG PO CPDR
800.0000 mg | DELAYED_RELEASE_CAPSULE | Freq: Three times a day (TID) | ORAL | Status: DC
  Administered 2014-01-08 – 2014-01-10 (×7): 800 mg via ORAL
  Filled 2014-01-08 (×9): qty 2

## 2014-01-08 MED ORDER — TECHNETIUM TC 99M DIETHYLENETRIAME-PENTAACETIC ACID: 38.6000 | Freq: Once | INTRAVENOUS | Status: AC | PRN

## 2014-01-08 NOTE — Progress Notes (Signed)
Bilateral lower extremity venous duplex:  No evidence of DVT, superficial thrombosis, or Baker's cyst.   

## 2014-01-08 NOTE — Progress Notes (Signed)
INITIAL NUTRITION ASSESSMENT  DOCUMENTATION CODES Per approved criteria  -Severe malnutrition in the context of chronic illness  Pt meets criteria for severe MALNUTRITION in the context of chronic illness as evidenced by >10% body weight loss in 6 months, PO intake <75% for > one month.   INTERVENTION: -Continue Resource Breeze TID -Reviewed IBD nutrition therapy  -Will continue to monitor  NUTRITION DIAGNOSIS: Unintentional wt loss related to inadequate energy intake as evidenced by 35 lbs weight loss in 7 months.   Goal: Pt to meet >/= 90% of their estimated nutrition needs    Monitor:  Total protein/energy intake, labs, weights, GI profile  Reason for Assessment: MST  68 y.o. male  Admitting Dx: <principal problem not specified>  ASSESSMENT: Sean Pope is a 68 y.o. male with PMH significant for Ulcerative colitis recently started on humira , malnutrition, CKD stage III Cr range 1.9 to 2.4, history of stroke who presents complaining of SOB on exertion, weakness since 1 week prior to admission  -Pt reported an unintentional wt loss of 30-35 lbs since 05/2013 (18% body weight loss, severe for time frame). Noted that weight has been stable within past 3 weeks.  -Endorsed difficulty in finding foods pt can tolerate. Diet recall indicates pt complying with low fiber/high protein diet. Breakfast consists of grits or white toast,lunch and dinner typically lean proteins (chicken, [place], fish). Pt snacks on greek yogurt or cheese and crackers -Unable to tolerate Ensure or Boost as they induce loose stools. 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/abd pain or increase in stool frequency -Discussed additional high protein/kcal snacks and supplement optionns -Offered snacks during admit; however pt declined as family willing to bring them in as  needed  Height: Ht Readings from Last 1 Encounters:  01/07/14 5' 11"  (1.803 m)    Weight: Wt Readings from Last 1 Encounters:  01/08/14 145 lb 3.2 oz (65.862 kg)    Ideal Body Weight: 172 lbs  % Ideal Body Weight: 84%  Wt Readings from Last 10 Encounters:  01/08/14 145 lb 3.2 oz (65.862 kg)  01/05/14 148 lb 9.6 oz (67.405 kg)  11/17/13 147 lb (66.679 kg)  10/31/13 152 lb 8 oz (69.174 kg)  09/14/13 170 lb (77.111 kg)  09/05/13 162 lb 14.7 oz (73.9 kg)  07/15/13 170 lb 6.4 oz (77.293 kg)  05/28/13 172 lb (78.019 kg)  04/09/13 172 lb 12.8 oz (78.382 kg)  01/24/12 175 lb (79.379 kg)    Usual Body Weight: 175-180 lbs  % Usual Body Weight: 83%  BMI:  Body mass index is 20.26 kg/(m^2).  Estimated Nutritional Needs: Kcal: 2000-2200 Protein: 80-90 gram Fluid: >/=2000 ml/daily  Skin: WDL  Diet Order: Cardiac  EDUCATION NEEDS: -Education needs addressed   Intake/Output Summary (Last 24 hours) at 01/08/14 1451 Last data filed at 01/08/14 1445  Gross per 24 hour  Intake    360 ml  Output    550 ml  Net   -190 ml    Last BM: 7/23   Labs:   Recent Labs Lab 01/05/14 0957 01/07/14 1150 01/08/14 0402  NA 135 135* 136*  K 3.0* 3.5* 3.7  CL 94* 93* 97  CO2 31 29 27   BUN 37* 47* 37*  CREATININE 2.7* 2.41* 2.07*  CALCIUM 8.5 8.8 8.5  PHOS 3.2  --   --   GLUCOSE 109* 136* 111*    CBG (  last 3)  No results found for this basename: GLUCAP,  in the last 72 hours  Scheduled Meds: . ceFEPime (MAXIPIME) IV  1 g Intravenous Q24H  . famotidine  20 mg Oral Daily  . feeding supplement (RESOURCE BREEZE)  1 Container Oral TID BM  . ferrous sulfate  325 mg Oral BID WC  . furosemide  80 mg Oral Daily  . guaiFENesin  600 mg Oral BID  . heparin  5,000 Units Subcutaneous 3 times per day  . ipratropium-albuterol  3 mL Nebulization TID  . Mesalamine  800 mg Oral TID  . simvastatin  40 mg Oral QPM  . sodium chloride  3 mL Intravenous Q12H  . vancomycin  750 mg  Intravenous Q24H    Continuous Infusions:   Past Medical History  Diagnosis Date  . Stroke   . SAH (subarachnoid hemorrhage)   . Hypertension   . Hyperlipidemia   . Lung collapse 09/26/2011  . Acute respiratory failure with hypoxia 09/17/2011  . 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 Ulcerative colitis 05/28/2013  . AKI (acute kidney injury)     In the setting of ulcerative colitis flare, question relationship to mesalamine  . Long-term use of immunosuppressant medication-mercaptopurine and Humira 01/05/2014  . Anemia-multifactorial 01/05/2014    Blood loss, medications, chronic illness kidney injury    Past Surgical History  Procedure Laterality Date  . Aneurysm coiling  2013    Posterior communicating  . Colonoscopy w/ biopsies  05/28/2013    Atlee Abide [place] LDN Clinical Dietitian [phone]

## 2014-01-08 NOTE — Progress Notes (Signed)
[facility] PROGRESS NOTE  Sean Pope WNU:[id number] DOB: 03-05-1946 DOA: 01/07/2014 PCP: Silvano Rusk, MD  Assessment/Plan: 68 y.o. male with PMH significant for Ulcerative colitis recently started on humira , malnutrition, CKD stage III Cr range 1.9 to 2.4, history of stroke, h/o tobacco use who presents complaining of SOB on exertion, weakness since 1 week prior to admission, fever   1. Fever/cough, clinically suspected PNA,  but CXR no clear infiltrate  -started on IV atx, blood cultures NGTD; cont inhalers,  -obtain CT chest for better evaluation; hold humira ? related to fever; TB skin test neg 11/2013   2. Acute respiratory failure of unclear etiology; likely COPD/amphesema with chronic tobacco use  -resolved on oxygen; [place] leg neg DVT; obtain VQ; cont oxygen, bronchodilators, needs OP PFTs  3. CKD III, cont PO diuretics;  4. Leg edema likely due to CKD; no s/s of CHF; echo (10/2013): LVEF 55%. Wall motion was normal; there were no regional wall motionabnormalities. -cont PO diuretics  5. IBD, no significant diarrhea; start Asacol; hold humira; OP GI follow up 6. Anemia likely AoCD: history of IDA; check iron profile; cont PO iron;  no s/s of acute bleeding       Code Status: full Family Communication: d/w patient; called updated Jacinto,Sheryl Spouse [phone] (indicate person spoken with, relationship, and if by phone, the number) Disposition Plan: home 2-3 days pend clinical improvement    Consultants:  none  Procedures:  none  Antibiotics:  Cefepime 7/23<<<<  vanc 7/23<<<<   (indicate start date, and stop date if known)  HPI/Subjective: alert  Objective: Filed Vitals:   01/08/14 0645  BP: 122/71  Pulse: 81  Temp: 99.2 F (37.3 C)  Resp: 22    Intake/Output Summary (Last 24 hours) at 01/08/14 0931 Last data filed at 01/08/14 0130  Gross per 24 hour  Intake      0 ml  Output    150 ml  Net   -150 ml   Filed Weights   01/07/14 1600  01/08/14 0645  Weight: 66.679 kg (147 lb) 65.862 kg (145 lb 3.2 oz)    Exam:   General:  alert  Cardiovascular: s1,s2 rrr  Respiratory: diminished A#E BL  Abdomen: soft, nt,nd   Musculoskeletal: mild pedal edema   Data Reviewed: Basic Metabolic Panel:  Recent Labs Lab 01/05/14 0957 01/07/14 1150 01/08/14 0402  NA 135 135* 136*  K 3.0* 3.5* 3.7  CL 94* 93* 97  CO2 31 29 27   GLUCOSE 109* 136* 111*  BUN 37* 47* 37*  CREATININE 2.7* 2.41* 2.07*  CALCIUM 8.5 8.8 8.5  PHOS 3.2  --   --    Liver Function Tests:  Recent Labs Lab 01/05/14 0957 01/07/14 1150  AST 29 38*  ALT 41 42  ALKPHOS 64 71  BILITOT 0.5 0.3  PROT 5.6* 5.5*  ALBUMIN 2.6* 2.2*   No results found for this basename: LIPASE, AMYLASE,  in the last 168 hours No results found for this basename: AMMONIA,  in the last 168 hours CBC:  Recent Labs Lab 01/05/14 0957 01/07/14 1150 01/08/14 0402  WBC 11.1* 9.2 9.5  NEUTROABS 7.9* 6.7  --   HGB 9.4* 8.9* 8.0*  HCT 28.6* 27.0* 24.1*  MCV 90.0 88.5 88.9  PLT 546.0* 522* 460*   Cardiac Enzymes: No results found for this basename: CKTOTAL, CKMB, CKMBINDEX, TROPONINI,  in the last 168 hours BNP (last 3 results)  Recent Labs  10/26/13 0930 01/07/14 1231  PROBNP 2729.0* 635.8*  CBG: No results found for this basename: GLUCAP,  in the last 168 hours  Recent Results (from the past 240 hour(s))  CULTURE, BLOOD (ROUTINE X 2)     Status: None   Collection Time    01/07/14  2:00 PM      Result Value Ref Range Status   Specimen Description BLOOD RIGHT HAND    Final   Special Requests BOTTLES DRAWN AEROBIC AND ANAEROBIC 2ML   Final   Culture  Setup Time     Final   Value: 01/07/2014 17:39     Performed at [facility]   Culture     Final   Value:        BLOOD CULTURE RECEIVED NO GROWTH TO DATE CULTURE WILL BE HELD FOR 5 DAYS BEFORE ISSUING A FINAL NEGATIVE REPORT     Performed at [facility]   Report Status PENDING   Incomplete   CULTURE, BLOOD (ROUTINE X 2)     Status: None   Collection Time    01/07/14  2:03 PM      Result Value Ref Range Status   Specimen Description BLOOD LAC   Final   Special Requests BOTTLES DRAWN AEROBIC AND ANAEROBIC 5CC   Final   Culture  Setup Time     Final   Value: 01/07/2014 17:39     Performed at [facility]   Culture     Final   Value:        BLOOD CULTURE RECEIVED NO GROWTH TO DATE CULTURE WILL BE HELD FOR 5 DAYS BEFORE ISSUING A FINAL NEGATIVE REPORT     Performed at [facility]   Report Status PENDING   Incomplete     Studies: Dg Chest 2 View  01/07/2014   CLINICAL DATA:  SHORTNESS OF BREATH  EXAM: CHEST  2 VIEW  COMPARISON:  Prior radiograph from 10/26/2013  FINDINGS: The cardiac and mediastinal silhouettes are stable in size and contour, and remain within normal limits.  The lungs are normal hyperinflated with attenuation of the pulmonary markings, greatest within the lung bases. Findings are consistent with emphysema. No airspace consolidation, pleural effusion, or pulmonary edema is identified. There is no pneumothorax.  There is mild accentuation of the normal thoracic kyphosis with multilevel degenerative changes seen within the visualized spine. No acute osseus abnormality.  IMPRESSION: 1. No active cardiopulmonary disease. 2. Emphysema.   Electronically Signed   By: Jeannine Boga M.D.   On: 01/07/2014 12:18    Scheduled Meds: . ceFEPime (MAXIPIME) IV  1 g Intravenous Q24H  . famotidine  20 mg Oral Daily  . feeding supplement (RESOURCE BREEZE)  1 Container Oral TID BM  . ferrous sulfate  325 mg Oral BID WC  . furosemide  80 mg Oral Daily  . guaiFENesin  600 mg Oral BID  . heparin  5,000 Units Subcutaneous 3 times per day  . ipratropium-albuterol  3 mL Nebulization TID  . simvastatin  40 mg Oral QPM  . sodium chloride  3 mL Intravenous Q12H  . vancomycin  750 mg Intravenous Q24H   Continuous Infusions:   Active Problems:   Hypoxia    Respiratory failure, acute   PNA (pneumonia)    Time spent: >35 minutes     Kinnie Feil  [facility] Pager [phone]. If 7PM-7AM, please contact night-coverage at [email or web address], password [facility] 01/08/2014, 9:31 AM  LOS: 1 day

## 2014-01-09 DIAGNOSIS — J189 Pneumonia, unspecified organism: Principal | ICD-10-CM

## 2014-01-09 LAB — URINE CULTURE
CULTURE: NO GROWTH
Colony Count: NO GROWTH

## 2014-01-09 LAB — CBC
HCT: 23.6 % — ABNORMAL LOW (ref 39.0–52.0)
Hemoglobin: 7.7 g/dL — ABNORMAL LOW (ref 13.0–17.0)
MCH: 29.3 pg (ref 26.0–34.0)
MCHC: 32.6 g/dL (ref 30.0–36.0)
MCV: 89.7 fL (ref 78.0–100.0)
PLATELETS: 448 10*3/uL — AB (ref 150–400)
RBC: 2.63 MIL/uL — ABNORMAL LOW (ref 4.22–5.81)
RDW: 17.9 % — AB (ref 11.5–15.5)
WBC: 9.6 10*3/uL (ref 4.0–10.5)

## 2014-01-09 MED ORDER — VANCOMYCIN HCL IN DEXTROSE 1-5 GM/200ML-% IV SOLN
1000.0000 mg | INTRAVENOUS | Status: DC
  Administered 2014-01-09 – 2014-01-10 (×2): 1000 mg via INTRAVENOUS
  Filled 2014-01-09 (×2): qty 200

## 2014-01-09 NOTE — Progress Notes (Signed)
[facility] PROGRESS NOTE  Sean Pope BEE:[id number] DOB: 1945/09/14 DOA: 01/07/2014 PCP: Silvano Rusk, MD  Assessment/Plan: 68 y.o. male with PMH significant for Ulcerative colitis recently started on humira , malnutrition, CKD stage III Cr range 1.9 to 2.4, history of stroke, h/o tobacco use who presents complaining of SOB on exertion, weakness since 1 week prior to admission, fever   1. Fever/cough, clinically suspected PNA,  but CXR no clear infiltrate  -CT: new pneumonia and a small pleural effusion posteriorly in the right lower lobe -afebrile; cont IV atx, blood cultures NGTD; cont inhalers, hold humira ? related to fever; TB skin test neg 11/2013   2. Acute respiratory failure of unclear etiology; likely COPD/amphesema with chronic tobacco use +PNA -resolved; [place] leg neg DVT; VQ low to mod prob; unlikely due to VTE; since +pnuemonia+COPD:  -cont oxygen, bronchodilators, needs OP PFTs;  3. CKD III, cont PO diuretics;  4. Leg edema likely due to CKD; no s/s of CHF; echo (10/2013): LVEF 55%. Wall motion was normal; there were no regional wall motionabnormalities. -cont PO diuretics  5. IBD, no significant diarrhea; start Asacol; hold humira; OP GI follow up 6. Anemia likely AoCD+ IDA; cont PO iron;  no s/s of acute bleeding; GI follow up  -recheck Hg in AM  7. CT: There is a stable right retrocrural lymph node since March of 2015. This has increased in size since April of 2014 -d/w patient; recommended to repeat CT in 4-6 week for further evaluation, after resolution of PNA       Code Status: full Family Communication: d/w patient; called updated Sean Pope,Sean Pope [phone] (indicate person spoken with, relationship, and if by phone, the number) Disposition Plan: home 2-3 days pend clinical improvement    Consultants:  none  Procedures:  none  Antibiotics:  Cefepime 7/23<<<<  vanc 7/23<<<<   (indicate start date, and stop date if  known)  HPI/Subjective: alert  Objective: Filed Vitals:   01/09/14 0539  BP: 118/71  Pulse: 80  Temp: 98.7 F (37.1 C)  Resp: 18    Intake/Output Summary (Last 24 hours) at 01/09/14 1058 Last data filed at 01/08/14 2317  Gross per 24 hour  Intake    360 ml  Output    400 ml  Net    -40 ml   Filed Weights   01/07/14 1600 01/08/14 0645 01/09/14 0541  Weight: 66.679 kg (147 lb) 65.862 kg (145 lb 3.2 oz) 66.3 kg (146 lb 2.6 oz)    Exam:   General:  alert  Cardiovascular: s1,s2 rrr  Respiratory: diminished A#E BL  Abdomen: soft, nt,nd   Musculoskeletal: mild pedal edema   Data Reviewed: Basic Metabolic Panel:  Recent Labs Lab 01/05/14 0957 01/07/14 1150 01/08/14 0402  NA 135 135* 136*  K 3.0* 3.5* 3.7  CL 94* 93* 97  CO2 31 29 27   GLUCOSE 109* 136* 111*  BUN 37* 47* 37*  CREATININE 2.7* 2.41* 2.07*  CALCIUM 8.5 8.8 8.5  PHOS 3.2  --   --    Liver Function Tests:  Recent Labs Lab 01/05/14 0957 01/07/14 1150  AST 29 38*  ALT 41 42  ALKPHOS 64 71  BILITOT 0.5 0.3  PROT 5.6* 5.5*  ALBUMIN 2.6* 2.2*   No results found for this basename: LIPASE, AMYLASE,  in the last 168 hours No results found for this basename: AMMONIA,  in the last 168 hours CBC:  Recent Labs Lab 01/05/14 0957 01/07/14 1150 01/08/14 0402 01/09/14 0435  WBC 11.1* 9.2 9.5 9.6  NEUTROABS 7.9* 6.7  --   --   HGB 9.4* 8.9* 8.0* 7.7*  HCT 28.6* 27.0* 24.1* 23.6*  MCV 90.0 88.5 88.9 89.7  PLT 546.0* 522* 460* 448*   Cardiac Enzymes: No results found for this basename: CKTOTAL, CKMB, CKMBINDEX, TROPONINI,  in the last 168 hours BNP (last 3 results)  Recent Labs  10/26/13 0930 01/07/14 1231  PROBNP 2729.0* 635.8*   CBG: No results found for this basename: GLUCAP,  in the last 168 hours  Recent Results (from the past 240 hour(s))  CULTURE, BLOOD (ROUTINE X 2)     Status: None   Collection Time    01/07/14  2:00 PM      Result Value Ref Range Status   Specimen  Description BLOOD RIGHT HAND    Final   Special Requests BOTTLES DRAWN AEROBIC AND ANAEROBIC 2ML   Final   Culture  Setup Time     Final   Value: 01/07/2014 17:39     Performed at [facility]   Culture     Final   Value:        BLOOD CULTURE RECEIVED NO GROWTH TO DATE CULTURE WILL BE HELD FOR 5 DAYS BEFORE ISSUING A FINAL NEGATIVE REPORT     Performed at [facility]   Report Status PENDING   Incomplete  CULTURE, BLOOD (ROUTINE X 2)     Status: None   Collection Time    01/07/14  2:03 PM      Result Value Ref Range Status   Specimen Description BLOOD LAC   Final   Special Requests BOTTLES DRAWN AEROBIC AND ANAEROBIC 5CC   Final   Culture  Setup Time     Final   Value: 01/07/2014 17:39     Performed at [facility]   Culture     Final   Value:        BLOOD CULTURE RECEIVED NO GROWTH TO DATE CULTURE WILL BE HELD FOR 5 DAYS BEFORE ISSUING A FINAL NEGATIVE REPORT     Performed at [facility]   Report Status PENDING   Incomplete     Studies: Dg Chest 2 View  01/07/2014   CLINICAL DATA:  SHORTNESS OF BREATH  EXAM: CHEST  2 VIEW  COMPARISON:  Prior radiograph from 10/26/2013  FINDINGS: The cardiac and mediastinal silhouettes are stable in size and contour, and remain within normal limits.  The lungs are normal hyperinflated with attenuation of the pulmonary markings, greatest within the lung bases. Findings are consistent with emphysema. No airspace consolidation, pleural effusion, or pulmonary edema is identified. There is no pneumothorax.  There is mild accentuation of the normal thoracic kyphosis with multilevel degenerative changes seen within the visualized spine. No acute osseus abnormality.  IMPRESSION: 1. No active cardiopulmonary disease. 2. Emphysema.   Electronically Signed   By: Jeannine Boga M.D.   On: 01/07/2014 12:18   Ct Chest Wo Contrast  01/08/2014   CLINICAL DATA:  Fever and cough with history of previous CVA, respiratory failure, in  pneumothorax.  EXAM: CT CHEST WITHOUT CONTRAST  TECHNIQUE: Multidetector CT imaging of the chest was performed following the standard protocol without IV contrast.  COMPARISON:  PA and lateral chest x-ray of today's date and CT scan of the chest dated September 24, 2011.  FINDINGS: The lungs are hyperinflated. There are emphysematous changes diffusely. There is new confluent soft tissue density posteriorly at the right lung base  consistent with pneumonia and a small effusion new since March of 2015. There are no pulmonary parenchymal nodules or masses.  The cardiac chambers are normal in size. The caliber of the thoracic aorta is normal. There is a precarinal lymph node which measures 10 mm in short axis. There is no bulky lymphadenopathy. The thoracic esophagus is unremarkable. The thyroid gland is normal.  There is superior endplate depression and partial compression of the body of T6 with loss of height anteriorly of approximately 15%new since April of 2013. There is no retropulsion of bone. The sternum and observed ribs are unremarkable.  Within the upper abdomen the observed portions of the liver and spleen are unremarkable. There is a structure consistent with an exophytic midpole cyst on the right with HU value of +5. There is a right retrocrural lymph node measuring 2 x 1.4 cm which has increased in size since 2013 but is not greatly changed since the March 2015 abdominal CT scan.  IMPRESSION: 1. There is new pneumonia and a small pleural effusion posteriorly in the right lower lobe when compared to the upper most images of the abdominal CT scan dated September 05, 2013. There are stable changes of COPD. 2. There is a stable right retrocrural lymph node since March of 2015. This has increased in size since April of 2014.   Electronically Signed   By: David  Martinique   On: 01/08/2014 10:29   Nm Pulmonary Perf And Vent  01/08/2014   CLINICAL DATA:  Sudden onset of shortness of breath.  EXAM: NUCLEAR MEDICINE  VENTILATION - PERFUSION LUNG SCAN  TECHNIQUE: Ventilation images were obtained in multiple projections using inhaled aerosol technetium 99 M DTPA. Perfusion images were obtained in multiple projections after intravenous injection of Tc-30mMAA.  RADIOPHARMACEUTICALS:  38.6 mCi mCi Tc-922mTPA aerosol and 5 mCi Tc-9981mA  COMPARISON:  PA and lateral chest of January 07, 2014 and a noncontrast CT scan of the chest of January 08, 2014.  FINDINGS: Ventilation: There is patchy deposition of the radiopharmaceutical throughout both lungs on the ventilation study. There is also swallowing of some of the activity.  Perfusion: No wedge shaped peripheral perfusion defects to suggest acute pulmonary embolism. There is a matched defect in the right mid lung anteriorly.  IMPRESSION: The study is limited due to the underlying COPD. Overall the probability for acute pulmonary embolism is considered low to indeterminate.   Electronically Signed   By: David  JorMartiniqueOn: 01/08/2014 11:23    Scheduled Meds: . ceFEPime (MAXIPIME) IV  1 g Intravenous Q24H  . famotidine  20 mg Oral Daily  . feeding supplement (RESOURCE BREEZE)  1 Container Oral TID BM  . ferrous sulfate  325 mg Oral BID WC  . furosemide  80 mg Oral Daily  . guaiFENesin  600 mg Oral BID  . heparin  5,000 Units Subcutaneous 3 times per day  . ipratropium-albuterol  3 mL Nebulization TID  . Mesalamine  800 mg Oral TID  . simvastatin  40 mg Oral QPM  . sodium chloride  3 mL Intravenous Q12H  . vancomycin  1,000 mg Intravenous Q24H   Continuous Infusions:   Active Problems:   Hypoxia   Respiratory failure, acute   PNA (pneumonia)    Time spent: >35 minutes     BURKinnie Feil[facility] Pager [phone] 7PM-7AM, please contact night-coverage at [email or web address], password [email or web address], 10:58 AM  LOS: 2 days

## 2014-01-09 NOTE — Progress Notes (Signed)
ANTIBIOTIC CONSULT NOTE - FOLLOW UP  Pharmacy Consult for Vancomycin and Cefepime Indication: rule out pneumonia  No Known Allergies  Patient Measurements: Height: 5' 11"  (180.3 cm) Weight: 146 lb 2.6 oz (66.3 kg) IBW/kg (Calculated) : 75.3  Vital Signs: Temp: 98.7 F (37.1 C) (07/25 0539) Temp src: Oral (07/25 0539) BP: 118/71 mmHg (07/25 0539) Pulse Rate: 80 (07/25 0539) Intake/Output from previous day: 07/24 0701 - 07/25 0700 In: 480 [P.O.:480] Out: 500 [Urine:500] Intake/Output from this shift:    Labs:  Recent Labs  01/07/14 1150 01/08/14 0402 01/09/14 0435  WBC 9.2 9.5 9.6  HGB 8.9* 8.0* 7.7*  PLT 522* 460* 448*  CREATININE 2.41* 2.07*  --    Estimated Creatinine Clearance: 32 ml/min (by C-G formula based on Cr of 2.07). No results found for this basename: VANCOTROUGH, VANCOPEAK, VANCORANDOM, GENTTROUGH, GENTPEAK, GENTRANDOM, TOBRATROUGH, TOBRAPEAK, TOBRARND, AMIKACINPEAK, AMIKACINTROU, AMIKACIN,  in the last 72 hours   Microbiology: Recent Results (from the past 720 hour(s))  CULTURE, BLOOD (ROUTINE X 2)     Status: None   Collection Time    01/07/14  2:00 PM      Result Value Ref Range Status   Specimen Description BLOOD RIGHT HAND    Final   Special Requests BOTTLES DRAWN AEROBIC AND ANAEROBIC 2ML   Final   Culture  Setup Time     Final   Value: 01/07/2014 17:39     Performed at [facility]   Culture     Final   Value:        BLOOD CULTURE RECEIVED NO GROWTH TO DATE CULTURE WILL BE HELD FOR 5 DAYS BEFORE ISSUING A FINAL NEGATIVE REPORT     Performed at [facility]   Report Status PENDING   Incomplete  CULTURE, BLOOD (ROUTINE X 2)     Status: None   Collection Time    01/07/14  2:03 PM      Result Value Ref Range Status   Specimen Description BLOOD LAC   Final   Special Requests BOTTLES DRAWN AEROBIC AND ANAEROBIC 5CC   Final   Culture  Setup Time     Final   Value: 01/07/2014 17:39     Performed at [facility]   Culture     Final   Value:        BLOOD CULTURE RECEIVED NO GROWTH TO DATE CULTURE WILL BE HELD FOR 5 DAYS BEFORE ISSUING A FINAL NEGATIVE REPORT     Performed at [facility]   Report Status PENDING   Incomplete    Assessment: 89 yom with PMH of Stroke, SAH, HTN, HLD, Ulcerative colitis, & AKI. Presented in ED with SOB x 1 week. [place] treatment last Thursday & c/o sob since then. Pharmacy consulted to dose Vancomycin & Cefepime for Pneumonia.   7/23 >>Vancomycin >> 7/23 >> Cefepime >>   Tmax: 99 WBCs: 9.5 Renal: SCr 2.07 CrCl 31(CG & N) (baseline 1.9-2.0)  7/23 blood: ngtd 7/24 urine: sent 7/24 strep pneumo antigen: neg 7/24 legionella antigen: neg  Goal of Therapy:  Vancomycin trough level 15-20 mcg/ml Cefepime dose per renal function  Plan: Day #3 antibiotics  Increase vancomycin to 1g IV q24h for improved SCr Check trough at new steady state  Continue cefepime 1g IV q24h Follow up renal function & cultures, de-escalation of tx  Peggyann Juba, PharmD, BCPS Pager: [phone] 01/09/2014,9:32 AM

## 2014-01-10 DIAGNOSIS — D500 Iron deficiency anemia secondary to blood loss (chronic): Secondary | ICD-10-CM

## 2014-01-10 LAB — BASIC METABOLIC PANEL
Anion gap: 11 (ref 5–15)
BUN: 25 mg/dL — AB (ref 6–23)
CO2: 29 mEq/L (ref 19–32)
Calcium: 8.6 mg/dL (ref 8.4–10.5)
Chloride: 95 mEq/L — ABNORMAL LOW (ref 96–112)
Creatinine, Ser: 1.71 mg/dL — ABNORMAL HIGH (ref 0.50–1.35)
GFR, EST AFRICAN AMERICAN: 46 mL/min — AB (ref >90)
GFR, EST NON AFRICAN AMERICAN: 39 mL/min — AB (ref >90)
Glucose, Bld: 101 mg/dL — ABNORMAL HIGH (ref 70–99)
POTASSIUM: 3.7 meq/L (ref 3.7–5.3)
SODIUM: 135 meq/L — AB (ref 137–147)

## 2014-01-10 LAB — CBC
HEMATOCRIT: 25.6 % — AB (ref 39.0–52.0)
Hemoglobin: 8.4 g/dL — ABNORMAL LOW (ref 13.0–17.0)
MCH: 29 pg (ref 26.0–34.0)
MCHC: 32.8 g/dL (ref 30.0–36.0)
MCV: 88.3 fL (ref 78.0–100.0)
PLATELETS: 539 10*3/uL — AB (ref 150–400)
RBC: 2.9 MIL/uL — ABNORMAL LOW (ref 4.22–5.81)
RDW: 17.1 % — AB (ref 11.5–15.5)
WBC: 10.7 10*3/uL — ABNORMAL HIGH (ref 4.0–10.5)

## 2014-01-10 MED ORDER — TIOTROPIUM BROMIDE MONOHYDRATE 18 MCG IN CAPS: 18.0000 ug | ORAL_CAPSULE | Freq: Every day | RESPIRATORY_TRACT | Status: DC

## 2014-01-10 MED ORDER — LEVOFLOXACIN 750 MG PO TABS: 750.0000 mg | ORAL_TABLET | ORAL | Status: DC

## 2014-01-10 MED ORDER — MESALAMINE 400 MG PO CPDR
800.0000 mg | DELAYED_RELEASE_CAPSULE | Freq: Three times a day (TID) | ORAL | Status: DC
Start: 2014-01-10 — End: 2014-01-15

## 2014-01-10 MED ORDER — MOMETASONE FURO-FORMOTEROL FUM 100-5 MCG/ACT IN AERO: 2.0000 | INHALATION_SPRAY | Freq: Two times a day (BID) | RESPIRATORY_TRACT | Status: DC

## 2014-01-10 MED ORDER — ALBUTEROL SULFATE (2.5 MG/3ML) 0.083% IN NEBU: 2.5000 mg | INHALATION_SOLUTION | RESPIRATORY_TRACT | Status: DC | PRN

## 2014-01-10 MED ORDER — ALBUTEROL SULFATE HFA 108 (90 BASE) MCG/ACT IN AERS: 2.0000 | INHALATION_SPRAY | Freq: Four times a day (QID) | RESPIRATORY_TRACT | Status: DC | PRN

## 2014-01-10 NOTE — Progress Notes (Signed)
SATURATION QUALIFICATIONS: (This note is used to comply with regulatory documentation for home oxygen)  Patient Saturations on Room Air at Rest = 95%  Patient Saturations on Room Air while Ambulating = 88%  Patient Saturations on 2 Liters of oxygen while Ambulating = 95%  Please briefly explain why patient needs home oxygen:This patient needs oxygen because while ambulating without oxygen his O2 levels below 90%.

## 2014-01-10 NOTE — Discharge Instructions (Signed)
Please follow  Up with primary care doctor in 1 week

## 2014-01-10 NOTE — Progress Notes (Signed)
CARE MANAGEMENT NOTE 01/10/2014  Patient:  CLEON, SIGNORELLI   Account Number:  [id number]  Date Initiated:  01/10/2014  Documentation initiated by:  [facility]  Subjective/Objective Assessment:   hypoxia, PNA     Action/Plan:   Anticipated DC Date:  01/10/2014   Anticipated DC Plan:  [place]  CM consult      Choice offered to / List presented to:     DME arranged  [facility]      DME agency  [place].        Status of service:  Completed, signed off Medicare Important Message given?  NA - LOS <3 / Initial given by admissions (If response is "NO", the following Medicare IM given date fields will be blank) Date Medicare IM given:   Medicare IM given by:   Date Additional Medicare IM given:   Additional Medicare IM given by:    Discharge Disposition:  HOME/SELF CARE  Per UR Regulation:    If discussed at Long Length of Stay Meetings, dates discussed:    Comments:  01/10/2014 1410  NCM notified [place] for oxygen for home. Jonnie Finner RN CCM Case Mgmt [phone]

## 2014-01-10 NOTE — Discharge Summary (Addendum)
Physician Discharge Summary  Sean Pope RKY:[id number] DOB: 1945/09/13 DOA: 01/07/2014  PCP: Silvano Rusk, MD  Admit date: 01/07/2014 Discharge date: 01/10/2014  Time spent: >35 minutes  Recommendations for Outpatient Follow-up:  F/u with PCP in 1 week  F/u with Pulmonologist in 3-4 weeks with PFTs Discharge Diagnoses:  Active Problems:   Hypoxia   Respiratory failure, acute   PNA (pneumonia)   Discharge Condition: stable   Diet recommendation: low sodium   Filed Weights   01/08/14 0645 01/09/14 0541 01/10/14 0450  Weight: 65.862 kg (145 lb 3.2 oz) 66.3 kg (146 lb 2.6 oz) 66.044 kg (145 lb 9.6 oz)    History of present illness:  68 y.o. male with PMH significant for Ulcerative colitis recently started on humira , malnutrition, CKD stage III Cr range 1.9 to 2.4, history of stroke, h/o tobacco use who presents complaining of SOB on exertion, weakness since 1 week prior to admission, fever    Hospital Course:  1. Fever/cough, clinically suspected PNA, CXR no clear infiltrate but CT: new pneumonia and a small pleural effusion posteriorly in the right lower lobe  -afebrile; improved on IV atx, blood cultures NGTD; cont inhalers, hold humira ? related to fever; TB skin test neg 11/2013  -changed to PO atx to complete outpatient treatment course  2. Acute respiratory failure likely COPD/emphysema with chronic tobacco use +PNA  -resolved; [place] leg neg DVT; VQ low to mod prob; unlikely due to VTE; since +pneumonia+COPD:  -cont bronchodilators, needs OP PFTs; home oxygen  3. CKD III, cont PO diuretics;  4. Leg edema likely due to CKD; no s/s of CHF; echo (10/2013): LVEF 55%. Wall motion was normal; there were no regional wall motionabnormalities.  -cont PO diuretics  5. IBD, no significant diarrhea; start Asacol; hold humira; OP GI follow up, tu resume humira 6. Anemia likely AoCD+ IDA; cont PO iron; no s/s of acute bleeding; GI follow up  7. CT: There is a stable right retrocrural lymph  node since March of 2015. This has increased in size since April of 2014  -d/w patient; recommended to repeat CT in 4-6 week for further evaluation, after resolution of PNA    Procedures:  none (i.e. Studies not automatically included, echos, thoracentesis, etc; not x-rays)  Consultations:  none  Discharge Exam: Filed Vitals:   01/10/14 0450  BP: 132/83  Pulse: 85  Temp: 98.7 F (37.1 C)  Resp: 18    General: alert Cardiovascular: s1,s2 rrr Respiratory: CTA BL  Discharge Instructions  Discharge Instructions   Diet - low sodium heart healthy    Complete by:  As directed      Discharge instructions    Complete by:  As directed   Please follow up with primary care doctor in 1 week     Increase activity slowly    Complete by:  As directed             Medication List    STOP taking these medications       Adalimumab 40 MG/0.8ML Pnkt  Commonly known as:  HUMIRA PEN      TAKE these medications       albuterol (2.5 MG/3ML) 0.083% nebulizer solution  Commonly known as:  PROVENTIL  Take 3 mLs (2.5 mg total) by nebulization every 2 (two) hours as needed for wheezing.     albuterol 108 (90 BASE) MCG/ACT inhaler  Commonly known as:  PROVENTIL HFA;VENTOLIN HFA  Inhale 2 puffs into the lungs every 6 (six)  hours as needed for wheezing or shortness of breath.     famotidine 20 MG tablet  Commonly known as:  PEPCID  Take 1 tablet by mouth daily.     furosemide 40 MG tablet  Commonly known as:  LASIX  Take 80 mg by mouth daily.     Iron 325 (65 FE) MG Tabs  Take 1 tablet by mouth 2 (two) times daily.     levofloxacin 750 MG tablet  Commonly known as:  LEVAQUIN  Take 1 tablet (750 mg total) by mouth every other day.     MENS MULTIVITAMIN PLUS PO  Take 1 tablet by mouth.     Mesalamine 400 MG Cpdr DR capsule  Commonly known as:  ASACOL  Take 2 capsules (800 mg total) by mouth 3 (three) times daily.     mometasone-formoterol 100-5 MCG/ACT Aero  Commonly  known as:  DULERA  Inhale 2 puffs into the lungs 2 (two) times daily.     Potassium Chloride ER 20 MEQ Tbcr  Take 20 mEq by mouth daily.     simvastatin 40 MG tablet  Commonly known as:  ZOCOR  Take 40 mg by mouth every evening.     tiotropium 18 MCG inhalation capsule  Commonly known as:  SPIRIVA HANDIHALER  Place 1 capsule (18 mcg total) into inhaler and inhale daily.       No Known Allergies     Follow-up Information   Follow up with Silvano Rusk, MD In 1 week.   Specialty:  Gastroenterology   Contact information:   [address] [place] [address] [phone]        The results of significant diagnostics from this hospitalization (including imaging, microbiology, ancillary and laboratory) are listed below for reference.    Significant Diagnostic Studies: Dg Chest 2 View  01/07/2014   CLINICAL DATA:  SHORTNESS OF BREATH  EXAM: CHEST  2 VIEW  COMPARISON:  Prior radiograph from 10/26/2013  FINDINGS: The cardiac and mediastinal silhouettes are stable in size and contour, and remain within normal limits.  The lungs are normal hyperinflated with attenuation of the pulmonary markings, greatest within the lung bases. Findings are consistent with emphysema. No airspace consolidation, pleural effusion, or pulmonary edema is identified. There is no pneumothorax.  There is mild accentuation of the normal thoracic kyphosis with multilevel degenerative changes seen within the visualized spine. No acute osseus abnormality.  IMPRESSION: 1. No active cardiopulmonary disease. 2. Emphysema.   Electronically Signed   By: Jeannine Boga M.D.   On: 01/07/2014 12:18   Ct Chest Wo Contrast  01/08/2014   CLINICAL DATA:  Fever and cough with history of previous CVA, respiratory failure, in pneumothorax.  EXAM: CT CHEST WITHOUT CONTRAST  TECHNIQUE: Multidetector CT imaging of the chest was performed following the standard protocol without IV contrast.  COMPARISON:  PA and lateral chest  x-ray of today's date and CT scan of the chest dated September 24, 2011.  FINDINGS: The lungs are hyperinflated. There are emphysematous changes diffusely. There is new confluent soft tissue density posteriorly at the right lung base consistent with pneumonia and a small effusion new since March of 2015. There are no pulmonary parenchymal nodules or masses.  The cardiac chambers are normal in size. The caliber of the thoracic aorta is normal. There is a precarinal lymph node which measures 10 mm in short axis. There is no bulky lymphadenopathy. The thoracic esophagus is unremarkable. The thyroid gland is normal.  There is  superior endplate depression and partial compression of the body of T6 with loss of height anteriorly of approximately 15%new since April of 2013. There is no retropulsion of bone. The sternum and observed ribs are unremarkable.  Within the upper abdomen the observed portions of the liver and spleen are unremarkable. There is a structure consistent with an exophytic midpole cyst on the right with HU value of +5. There is a right retrocrural lymph node measuring 2 x 1.4 cm which has increased in size since 2013 but is not greatly changed since the March 2015 abdominal CT scan.  IMPRESSION: 1. There is new pneumonia and a small pleural effusion posteriorly in the right lower lobe when compared to the upper most images of the abdominal CT scan dated September 05, 2013. There are stable changes of COPD. 2. There is a stable right retrocrural lymph node since March of 2015. This has increased in size since April of 2014.   Electronically Signed   By: David  Martinique   On: 01/08/2014 10:29   Nm Pulmonary Perf And Vent  01/08/2014   CLINICAL DATA:  Sudden onset of shortness of breath.  EXAM: NUCLEAR MEDICINE VENTILATION - PERFUSION LUNG SCAN  TECHNIQUE: Ventilation images were obtained in multiple projections using inhaled aerosol technetium 99 M DTPA. Perfusion images were obtained in multiple projections after  intravenous injection of Tc-35mMAA.  RADIOPHARMACEUTICALS:  38.6 mCi mCi Tc-910mTPA aerosol and 5 mCi Tc-9967mA  COMPARISON:  PA and lateral chest of January 07, 2014 and a noncontrast CT scan of the chest of January 08, 2014.  FINDINGS: Ventilation: There is patchy deposition of the radiopharmaceutical throughout both lungs on the ventilation study. There is also swallowing of some of the activity.  Perfusion: No wedge shaped peripheral perfusion defects to suggest acute pulmonary embolism. There is a matched defect in the right mid lung anteriorly.  IMPRESSION: The study is limited due to the underlying COPD. Overall the probability for acute pulmonary embolism is considered low to indeterminate.   Electronically Signed   By: David  JorMartiniqueOn: 01/08/2014 11:23    Microbiology: Recent Results (from the past 240 hour(s))  CULTURE, BLOOD (ROUTINE X 2)     Status: None   Collection Time    01/07/14  2:00 PM      Result Value Ref Range Status   Specimen Description BLOOD RIGHT HAND    Final   Special Requests BOTTLES DRAWN AEROBIC AND ANAEROBIC 2ML   Final   Culture  Setup Time     Final   Value: 01/07/2014 17:39     [facility]     Final   Value:        BLOOD CULTURE RECEIVED NO GROWTH TO DATE CULTURE WILL BE HELD FOR 5 DAYS BEFORE ISSUING A FINAL NEGATIVE REPORT     [facility] PENDING   Incomplete  CULTURE, BLOOD (ROUTINE X 2)     Status: None   Collection Time    01/07/14  2:03 PM      Result Value Ref Range Status   Specimen Description BLOOD LAC   Final   Special Requests BOTTLES DRAWN AEROBIC AND ANAEROBIC 5CC   Final   Culture  Setup Time     Final   Value: 01/07/2014 17:39     [facility]     Final   Value:  BLOOD CULTURE RECEIVED NO GROWTH TO DATE CULTURE WILL BE HELD FOR 5 DAYS BEFORE ISSUING A FINAL NEGATIVE REPORT     Performed at [facility]   Report Status  PENDING   Incomplete  URINE CULTURE     Status: None   Collection Time    01/08/14  1:20 AM      Result Value Ref Range Status   Specimen Description URINE, CLEAN CATCH   Final   Special Requests NONE   Final   Culture  Setup Time     Final   Value: 01/08/2014 08:53     Performed at [place]     Final   Value: NO GROWTH     Performed at [facility]   Culture     Final   Value: NO GROWTH     Performed at [facility]   Report Status 01/09/2014 FINAL   Final     Labs: Basic Metabolic Panel:  Recent Labs Lab 01/05/14 0957 01/07/14 1150 01/08/14 0402 01/10/14 0500  NA 135 135* 136* 135*  K 3.0* 3.5* 3.7 3.7  CL 94* 93* 97 95*  CO2 31 29 27 29   GLUCOSE 109* 136* 111* 101*  BUN 37* 47* 37* 25*  CREATININE 2.7* 2.41* 2.07* 1.71*  CALCIUM 8.5 8.8 8.5 8.6  PHOS 3.2  --   --   --    Liver Function Tests:  Recent Labs Lab 01/05/14 0957 01/07/14 1150  AST 29 38*  ALT 41 42  ALKPHOS 64 71  BILITOT 0.5 0.3  PROT 5.6* 5.5*  ALBUMIN 2.6* 2.2*   No results found for this basename: LIPASE, AMYLASE,  in the last 168 hours No results found for this basename: AMMONIA,  in the last 168 hours CBC:  Recent Labs Lab 01/05/14 0957 01/07/14 1150 01/08/14 0402 01/09/14 0435 01/10/14 0500  WBC 11.1* 9.2 9.5 9.6 10.7*  NEUTROABS 7.9* 6.7  --   --   --   HGB 9.4* 8.9* 8.0* 7.7* 8.4*  HCT 28.6* 27.0* 24.1* 23.6* 25.6*  MCV 90.0 88.5 88.9 89.7 88.3  PLT 546.0* 522* 460* 448* 539*   Cardiac Enzymes: No results found for this basename: CKTOTAL, CKMB, CKMBINDEX, TROPONINI,  in the last 168 hours BNP: BNP (last 3 results)  Recent Labs  10/26/13 0930 01/07/14 1231  PROBNP 2729.0* 635.8*   CBG: No results found for this basename: GLUCAP,  in the last 168 hours     Signed:  Rowe Clack N  [facility] 01/10/2014, 11:05 AM   Addendum:  Severe protein calorie malnutrition; cont encourage PO intake;  Verina Galeno

## 2014-01-12 ENCOUNTER — Telehealth: Payer: Self-pay | Admitting: Internal Medicine

## 2014-01-12 NOTE — Telephone Encounter (Signed)
Patient scheduled for 01/22/14 9:00.  Appt scheduled with the patients wife

## 2014-01-13 ENCOUNTER — Telehealth: Payer: Self-pay | Admitting: Internal Medicine

## 2014-01-13 DIAGNOSIS — J189 Pneumonia, unspecified organism: Secondary | ICD-10-CM

## 2014-01-13 DIAGNOSIS — K5100 Ulcerative (chronic) pancolitis without complications: Secondary | ICD-10-CM

## 2014-01-13 LAB — CULTURE, BLOOD (ROUTINE X 2)
CULTURE: NO GROWTH
Culture: NO GROWTH

## 2014-01-13 NOTE — Telephone Encounter (Signed)
Meds should be mercaptopurine and Humira  Not mesalamine (Asacol)  1) CBC, Renal function panel, CXR PA/Lateral by tomorrow please 2) Hold off on taking Humira until I tell him  When is REV w/ [place]?

## 2014-01-13 NOTE — Telephone Encounter (Signed)
Sean Pope has pneumonia and is currently taking Levaquin, running no fever today.  Please advise patient whether to take his Humira injection tomorrow Sir, thank you.  Also he has a question about taking Asacol , the hospital Dr. Refilled it and he said you had d/c'ed it due to causing kidney disease.  He thinks it was refilled in error but wants to confirm Sir.

## 2014-01-13 NOTE — Telephone Encounter (Signed)
Spoke with both the patient and wife on the phone.  They will come tomorrow and get the labs and x-ray done.  He will hold his Humira until we tell him to take it.  I informed him which meds he is suppose to be taking and that he is not to be on the asacol.  Orders put in.  He has a f/u appt with Dr Carlean Purl for 01/22/14.

## 2014-01-14 ENCOUNTER — Other Ambulatory Visit (INDEPENDENT_AMBULATORY_CARE_PROVIDER_SITE_OTHER): Payer: Managed Care, Other (non HMO)

## 2014-01-14 ENCOUNTER — Ambulatory Visit (INDEPENDENT_AMBULATORY_CARE_PROVIDER_SITE_OTHER)
Admission: RE | Admit: 2014-01-14 | Discharge: 2014-01-14 | Disposition: A | Discharge status: Home / Self Care | Payer: Managed Care, Other (non HMO) | Source: Ambulatory Visit | Attending: Internal Medicine | Admitting: Internal Medicine

## 2014-01-14 ENCOUNTER — Telehealth: Payer: Self-pay | Admitting: Internal Medicine

## 2014-01-14 DIAGNOSIS — K5100 Ulcerative (chronic) pancolitis without complications: Secondary | ICD-10-CM

## 2014-01-14 DIAGNOSIS — Z23 Encounter for immunization: Secondary | ICD-10-CM

## 2014-01-14 DIAGNOSIS — J189 Pneumonia, unspecified organism: Secondary | ICD-10-CM

## 2014-01-14 LAB — RENAL FUNCTION PANEL
ALBUMIN: 2.3 g/dL — AB (ref 3.5–5.2)
BUN: 25 mg/dL — ABNORMAL HIGH (ref 6–23)
CHLORIDE: 102 meq/L (ref 96–112)
CO2: 29 mEq/L (ref 19–32)
Calcium: 8.9 mg/dL (ref 8.4–10.5)
Creatinine, Ser: 1.8 mg/dL — ABNORMAL HIGH (ref 0.4–1.5)
GFR: 41.08 mL/min — AB (ref >60.00)
Glucose, Bld: 110 mg/dL — ABNORMAL HIGH (ref 70–99)
PHOSPHORUS: 3.1 mg/dL (ref 2.3–4.6)
Potassium: 3.7 mEq/L (ref 3.5–5.1)
Sodium: 139 mEq/L (ref 135–145)

## 2014-01-14 LAB — CBC WITH DIFFERENTIAL/PLATELET
BASOS ABS: 0.1 10*3/uL (ref 0.0–0.1)
BASOS PCT: 0.4 % (ref 0.0–3.0)
EOS ABS: 0.1 10*3/uL (ref 0.0–0.7)
Eosinophils Relative: 0.7 % (ref 0.0–5.0)
HCT: 30.5 % — ABNORMAL LOW (ref 39.0–52.0)
Hemoglobin: 10 g/dL — ABNORMAL LOW (ref 13.0–17.0)
LYMPHS PCT: 27.2 % (ref 12.0–46.0)
Lymphs Abs: 4.2 10*3/uL — ABNORMAL HIGH (ref 0.7–4.0)
MCHC: 32.8 g/dL (ref 30.0–36.0)
MCV: 89.3 fl (ref 78.0–100.0)
Monocytes Absolute: 1.6 10*3/uL — ABNORMAL HIGH (ref 0.1–1.0)
Monocytes Relative: 10.5 % (ref 3.0–12.0)
Neutro Abs: 9.4 10*3/uL — ABNORMAL HIGH (ref 1.4–7.7)
Neutrophils Relative %: 61.2 % (ref 43.0–77.0)
Platelets: 627 10*3/uL — ABNORMAL HIGH (ref 150.0–400.0)
RBC: 3.41 Mil/uL — AB (ref 4.22–5.81)
RDW: 18.5 % — ABNORMAL HIGH (ref 11.5–15.5)
WBC: 15.3 10*3/uL — ABNORMAL HIGH (ref 4.0–10.5)

## 2014-01-14 NOTE — Telephone Encounter (Signed)
Will send when they result

## 2014-01-15 ENCOUNTER — Other Ambulatory Visit: Payer: Self-pay | Admitting: Internal Medicine

## 2014-01-15 NOTE — Progress Notes (Signed)
Quick Note:  CXR ok Labs better (send these results to Dr. Elmarie Shiley)  He should take his Humira and is supposed to be on mercaptopurine, too ______

## 2014-01-15 NOTE — Telephone Encounter (Signed)
Labs faxed as requested to Dr. Posey Pronto

## 2014-01-18 ENCOUNTER — Other Ambulatory Visit: Payer: Self-pay

## 2014-01-18 ENCOUNTER — Telehealth: Payer: Self-pay | Admitting: Internal Medicine

## 2014-01-18 DIAGNOSIS — R0600 Dyspnea, unspecified: Secondary | ICD-10-CM

## 2014-01-18 MED ORDER — MERCAPTOPURINE 50 MG PO TABS: 75.0000 mg | ORAL_TABLET | Freq: Every day | ORAL | Status: DC

## 2014-01-18 NOTE — Telephone Encounter (Signed)
See results note from 01/18/14

## 2014-01-18 NOTE — Telephone Encounter (Signed)
All questions answered with Doy Mince

## 2014-01-18 NOTE — Progress Notes (Signed)
Quick Note:  OK SOB could be from heart Rare problems with heart failure and Humira He had an Echo in May We need to repeat that to see if heart pumping has changed  Order echocardiogram for this week please  Reason - dyspnea - evaluate heart function  Also - he appears to have COPD  Needs pulmonary appointment (tthis week if possible) to look at lungs and see waht Tx he needs  Hold off on Humira  He needs to be on his mercaptopurine - please confirm this and dose he on and add back to med list - if not he is not on anything for his colitis and I fear that will worsen rapidly       ______

## 2014-01-19 ENCOUNTER — Ambulatory Visit (INDEPENDENT_AMBULATORY_CARE_PROVIDER_SITE_OTHER): Payer: Managed Care, Other (non HMO) | Admitting: Internal Medicine

## 2014-01-19 ENCOUNTER — Encounter: Payer: Self-pay | Admitting: Internal Medicine

## 2014-01-19 VITALS — BP 112/62 | HR 92 | Temp 98.8°F | Ht 70.0 in | Wt 142.0 lb

## 2014-01-19 DIAGNOSIS — R918 Other nonspecific abnormal finding of lung field: Secondary | ICD-10-CM

## 2014-01-19 DIAGNOSIS — R0600 Dyspnea, unspecified: Secondary | ICD-10-CM

## 2014-01-19 DIAGNOSIS — M7989 Other specified soft tissue disorders: Secondary | ICD-10-CM

## 2014-01-19 DIAGNOSIS — R0989 Other specified symptoms and signs involving the circulatory and respiratory systems: Secondary | ICD-10-CM

## 2014-01-19 DIAGNOSIS — R0609 Other forms of dyspnea: Secondary | ICD-10-CM

## 2014-01-19 NOTE — Patient Instructions (Addendum)
Work on inhaler technique:  relax and gently blow all the way out then take a nice smooth deep breath back in, triggering the inhaler at same time you start breathing in.  Hold for up to 5 seconds if you can.  Rinse and gargle with water when done  dulera 100 Take 2 puffs first thing in am and then another 2 puffs about 12 hours later.  spiriva one capsule each am  Only use your albuterol (ventolin) as a rescue medication to be used if you can't catch your breath by resting or doing a relaxed purse lip breathing pattern.  - The less you use it, the better it will work when you need it. - Ok to use up to 2 puffs  every 4 hours if you must but call for immediate appointment if use goes up over your usual need - Don't leave home without it !!  (think of it like the spare tire for your car)  - no need to use nebulizer medication at all   02 2 lpm at bedtime and as needed during the day - pace yourself when walking     Please schedule a follow up office visit in 2 weeks, sooner if needed

## 2014-01-19 NOTE — Progress Notes (Signed)
Subjective:    Patient ID: Sean Pope, male    DOB: 04/12/1946    MRN: [id number]  HPI  44 yowm quit smoking 2013 no problem with ex tol or need for inhalers though no aerobics then stopped prednisne and  started humera 12/31/13  per Dr Carlean Purl for UC and 4 days later abrupt onset sob x 1 week prior to going to [facility] where admitted with dx of pna.  Admit date: 01/07/2014  Discharge date: 01/10/2014   Discharge Diagnoses:   Hypoxia  Respiratory failure, acute  PNA (pneumonia)   History of present illness:  68 y.o. male with PMH significant for Ulcerative colitis recently started on humira , malnutrition, CKD stage III Cr range 1.9 to 2.4, history of stroke, h/o tobacco use who presents complaining of SOB on exertion, weakness since 1 week prior to admission, fever  Hospital Course:  1. Fever/cough, clinically suspected PNA, CXR no clear infiltrate but CT: new pneumonia and a small pleural effusion posteriorly in the right lower lobe  -afebrile; improved on IV atx, blood cultures NGTD; cont inhalers, hold humira ? related to fever; TB skin test neg 11/2013  -changed to PO atx to complete outpatient treatment course  2. Acute respiratory failure likely COPD/emphysema with chronic tobacco use +PNA  -resolved; [place] leg neg DVT; VQ low to mod prob; unlikely due to VTE; since +pneumonia+COPD:  -cont bronchodilators, needs OP PFTs; home oxygen  3. CKD III, cont PO diuretics;  4. Leg edema likely due to CKD; no s/s of CHF; echo (10/2013): LVEF 55%. Wall motion was normal; there were no regional wall motionabnormalities.  -cont PO diuretics  5. IBD, no significant diarrhea; start Asacol; hold humira; OP GI follow up, tu resume humira  6. Anemia likely AoCD+ IDA; cont PO iron; no s/s of acute bleeding; GI follow up  7. CT: There is a stable right retrocrural lymph node since March of 2015. This has increased in size since April of 2014  -d/w patient; recommended to repeat CT in 4-6 week  for further evaluation, after resolution of PNA     01/19/2014 post hosp eval/ 1st  ov/Sean Pope re: f/u ? CAP/  fever resolved / back to baseline doe  Chief Complaint  Patient presents with  . Pulmonary Consult    Referred by Dr. Carlean Purl for dyspnea.   Onset of diarrhea x one year, dx UC Jan 2015  Doe on 02 x steps, does ok flat moderate x 2lpm  Using lots of albuterol in hfa/ neb ? Helps some not sure Never purulent sputum/ min assoc dry cough, better since discharge  No obvious day to day or daytime variabilty or assoc   cp or chest tightness, subjective wheeze overt sinus or hb symptoms. No unusual exp hx or h/o childhood pna/ asthma or knowledge of premature birth.  Sleeping ok without nocturnal  or early am exacerbation  of respiratory  c/o's or need for noct saba. Also denies any obvious fluctuation of symptoms with weather or environmental changes or other aggravating or alleviating factors except as outlined above   Current Medications, Allergies, Complete Past Medical History, Past Surgical History, Family History, and Social History were reviewed in [employer] record.          Review of Systems  Constitutional: Negative for fever and unexpected weight change.  HENT: Negative for congestion, dental problem, ear pain, nosebleeds, postnasal drip, rhinorrhea, sinus pressure, sneezing, sore throat and trouble swallowing.   Eyes:  Negative for redness and itching.  Respiratory: Positive for cough and shortness of breath. Negative for chest tightness and wheezing.   Cardiovascular: Positive for leg swelling. Negative for palpitations.  Gastrointestinal: Negative for nausea and vomiting.  Genitourinary: Negative for dysuria.  Musculoskeletal: Negative for joint swelling.  Skin: Negative for rash.  Neurological: Negative for headaches.  Hematological: Does not bruise/bleed easily.  Psychiatric/Behavioral: Negative for dysphoric mood. The patient is not  nervous/anxious.        Objective:   Physical Exam  amb wm nad  Wt Readings from Last 3 Encounters:  01/19/14 142 lb (64.411 kg)  01/10/14 145 lb 9.6 oz (66.044 kg)  01/05/14 148 lb 9.6 oz (67.405 kg)      HEENT: nl dentition, turbinates, and orophanx. Nl external ear canals without cough reflex   NECK :  without JVD/Nodes/TM/ nl carotid upstrokes bilaterally   LUNGS: no acc muscle use, clear to A and P bilaterally without cough on insp or exp maneuvers   CV:  RRR  no s3 or murmur or increase in P2, 1-2 Plus pitting bilaterl LE  edema   ABD:  soft and nontender with nl excursion in the supine position. No bruits or organomegaly, bowel sounds nl  MS:  warm without deformities, calf tenderness, cyanosis or clubbing  SKIN: warm and dry without lesions    NEURO:  alert, approp, no deficits      cxr  01/14/14 COPD. There is no definite evidence of pneumonia.    Chemistry      Component Value Date/Time   NA 139 01/14/2014 0828   K 3.7 01/14/2014 0828   CL 102 01/14/2014 0828   CO2 29 01/14/2014 0828   BUN 25* 01/14/2014 0828   CREATININE 1.8* 01/14/2014 0828      Component Value Date/Time   CALCIUM 8.9 01/14/2014 0828   ALKPHOS 71 01/07/2014 1150   AST 38* 01/07/2014 1150   ALT 42 01/07/2014 1150   BILITOT 0.3 01/07/2014 1150       Lab Results  Component Value Date   HGB 10.0* 01/14/2014   HGB 8.4* 01/10/2014   HGB 7.7* 01/09/2014    Lab Results  Component Value Date   PROBNP 635.8* 01/07/2014       Assessment & Plan:

## 2014-01-20 DIAGNOSIS — J439 Emphysema, unspecified: Secondary | ICD-10-CM | POA: Insufficient documentation

## 2014-01-20 NOTE — Assessment & Plan Note (Addendum)
CXR and exam are now nl and he is back to baseline sob (see dypnea).  ddx = HCAP,  UC related pnuemonitis, doubt humera reaction, doubt chf though clearly vol overloaded.  For now plan f/u conservatively in 2 weeks

## 2014-01-20 NOTE — Assessment & Plan Note (Addendum)
-   Echo mild RAE 10/29/13 with VQ low / intermediate  01/08/14  - 01/19/2014   Walked RA x one lap @ slow pace @ 185 ft stopped due to  Pulse 154  but not sob or desat   Appears to be overusing saba with secondary tachycardia > rec minimize saba, return for pfts  In meantime start dulera 100 2bid pending return for pfts  The proper method of use, as well as anticipated side effects, of a metered-dose inhaler are discussed and demonstrated to the patient. Improved effectiveness after extensive coaching during this visit to a level of approximately  75%

## 2014-01-20 NOTE — Assessment & Plan Note (Addendum)
01/08/14 Venous dopplers neg bilaterally  - alb 2.3 01/14/14  - BNP 636 on 01/07/14   Most likely multifactorial but rx with elevation/ lasix as planned

## 2014-01-22 ENCOUNTER — Encounter: Payer: Self-pay | Admitting: Internal Medicine

## 2014-01-22 ENCOUNTER — Ambulatory Visit (INDEPENDENT_AMBULATORY_CARE_PROVIDER_SITE_OTHER): Payer: Managed Care, Other (non HMO) | Admitting: Internal Medicine

## 2014-01-22 VITALS — BP 120/74 | HR 72 | Ht 70.0 in | Wt 143.8 lb

## 2014-01-22 DIAGNOSIS — E43 Unspecified severe protein-calorie malnutrition: Secondary | ICD-10-CM

## 2014-01-22 DIAGNOSIS — Z79899 Other long term (current) drug therapy: Secondary | ICD-10-CM

## 2014-01-22 DIAGNOSIS — K602 Anal fissure, unspecified: Secondary | ICD-10-CM

## 2014-01-22 DIAGNOSIS — Z7960 Long term (current) use of unspecified immunomodulators and immunosuppressants: Secondary | ICD-10-CM

## 2014-01-22 DIAGNOSIS — K5100 Ulcerative (chronic) pancolitis without complications: Secondary | ICD-10-CM

## 2014-01-22 HISTORY — DX: Anal fissure, unspecified: K60.2

## 2014-01-22 MED ORDER — DILTIAZEM GEL 2 %: 1.0000 "application " | Freq: Two times a day (BID) | CUTANEOUS | Status: DC

## 2014-01-22 MED ORDER — ADALIMUMAB 40 MG/0.8ML ~~LOC~~ AJKT: 40.0000 mg | AUTO-INJECTOR | SUBCUTANEOUS | Status: DC

## 2014-01-22 NOTE — Progress Notes (Signed)
Subjective:    Patient ID: Sean Pope, male    DOB: 21-Jul-1945, 68 y.o.   MRN: [id number]  HPI Area and he saw Dr. Melvyn Novas regarding his dyspnea and lung problems and an inhaler was changed and there are plans for PFTs. Dr. Melvyn Novas like me does not think he had pneumonia instead had fluid overload probably related to his renal failure issues. He is out of work. He plans to stay out of work this week and next week. He's actually developed rectal pain, with defecation. His stools are forming up in her heart or straining somewhat. Somehow he came off his mercaptopurine probably when he was in the hospital, but he is on that again. He has not yet taken her second dose of Humira. I've had some concerns about that and heart failure though doubt that the problem so we held it, and he had called being concerned about the possibility of pneumonia and since he was still short of breath as to whether or not he should take that medicine. Asacol was started again when he was in the hospital but he realize that he was not supposed to be on that stopped it.  Leg edema persists. He remains somewhat weak but improved. He is using oxygen only at night at this time. No Known Allergies Outpatient Prescriptions Prior to Visit  Medication Sig Dispense Refill  . albuterol (PROVENTIL HFA;VENTOLIN HFA) 108 (90 BASE) MCG/ACT inhaler Inhale 2 puffs into the lungs every 6 (six) hours as needed for wheezing or shortness of breath.  1 Inhaler  2  . diphenoxylate-atropine (LOMOTIL) 2.5-0.025 MG per tablet Take by mouth 4 (four) times daily as needed for diarrhea or loose stools.      . famotidine (PEPCID) 20 MG tablet Take 1 tablet by mouth daily.      . Ferrous Sulfate (IRON) 325 (65 FE) MG TABS Take 1 tablet by mouth 2 (two) times daily.       . furosemide (LASIX) 40 MG tablet Take 80 mg by mouth daily.      . mercaptopurine (PURINETHOL) 50 MG tablet Take 1.5 tablets (75 mg total) by mouth daily. Give on an empty stomach 1 hour  before or 2 hours after meals. Caution: Chemotherapy.  45 tablet  2  . mometasone-formoterol (DULERA) 100-5 MCG/ACT AERO Inhale 2 puffs into the lungs 2 (two) times daily.  1 Inhaler  0  . Multiple Vitamins-Minerals (MENS MULTIVITAMIN PLUS PO) Take 1 tablet by mouth.      . potassium chloride 20 MEQ TBCR Take 20 mEq by mouth daily.  30 tablet  1  . simvastatin (ZOCOR) 40 MG tablet Take 40 mg by mouth every evening.      . tiotropium (SPIRIVA HANDIHALER) 18 MCG inhalation capsule Place 1 capsule (18 mcg total) into inhaler and inhale daily.  30 capsule  12  . HUMIRA PEN 40 MG/0.8ML PNKT Inject into the skin every 21 ( twenty-one) days.      Marland Kitchen albuterol (PROVENTIL) (2.5 MG/3ML) 0.083% nebulizer solution Take 3 mLs (2.5 mg total) by nebulization every 2 (two) hours as needed for wheezing.  75 mL  12   No facility-administered medications prior to visit.   Past Medical History  Diagnosis Date  . Stroke   . SAH (subarachnoid hemorrhage)   . Hypertension   . Hyperlipidemia   . Lung collapse 09/26/2011  . Acute respiratory failure with hypoxia 09/17/2011  . 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 Ulcerative colitis 05/28/2013  .  AKI (acute kidney injury)     In the setting of ulcerative colitis flare, question relationship to mesalamine  . Long-term use of immunosuppressant medication-mercaptopurine and Humira 01/05/2014  . Anemia-multifactorial 01/05/2014    Blood loss, medications, chronic illness kidney injury  . Anal fissure - posterior 01/22/2014  . Respiratory failure, acute 01/07/2014   Past Surgical History  Procedure Laterality Date  . Aneurysm coiling  2013    Posterior communicating  . Colonoscopy w/ biopsies  05/28/2013   History   Social History  . Marital Status: Married    Spouse Name: N/A    Number of Children: 2  . Years of Education: N/A   Occupational History  . Director     [employer]  .     Social History Main Topics  . Smoking status: Former Smoker --  0.50 packs/day for 50 years    Types: Cigarettes    Quit date: 06/19/2011  . Smokeless tobacco: Never Used  . Alcohol Use: Yes     Comment: rare  . Drug Use: No  . Sexual Activity: No   Other Topics Concern  . None   Social History Narrative   Married, 1 son and 1 daughter   TEFL teacher   1.5 caffeine drinks daily   Family History  Problem Relation Age of Onset  . Heart disease Mother     Review of Systems As above, all other systems negative    Objective:   Physical Exam General:  no acute distress,  Eyes:  anicteric. Lungs: Clear to auscultation bilaterally. - reduced BS diffusely, no rales Heart:  S1S2, no rubs, murmurs, gallops. Abdomen:  soft, non-tender, no hepatosplenomegaly, hernia, or mass and BS+.  Rectal: Posterior anal sentinel pile and fissure Extremities:   2+ edema Skin   no rash. Neuro:  A&O x 3.  Psych:  appropriate mood and  Affect.   Data Reviewed: Hospital records, pulmonary notes, recent labs    Assessment & Plan:  Ulcerative chronic pancolitis Improved Resume Humira as I think CHF issues are not really his problem and unlikely to be from one dose of Humira. I was able to have a conversation with him in person about this. Keep 8/21 appt with me for followup Continue mercaptopurine Stay off the mesalamine  Anal fissure - posterior Diltiazem gel and sitz baths. Anal fissure handout provided. Reduce lomotil so he is not straining to stool  Protein-calorie malnutrition, severe Remains a problem, related to his overall disease issues. Discuss today but will call or recommend he take nutritional supplements like boost or Ensure at this point.  Long-term use of immunosuppressant medication-mercaptopurine and Humira No signs of toxicity. Repeating an echocardiogram just to make sure his EF is stable may need to monitor that over time. I think the risk benefit ratio of Humira favors benefits at this time   He  will remain at work at this point, and contact me in about a week's CL is doing, and we can provide a return to work out he says his employer is have requested that. I've had a discussion with him about trying to separate from work and to relax and recuperate more.  CC: Kathyrn Lass M.D., Christinia Gully M.D. Graylon Gunning M.D.

## 2014-01-22 NOTE — Assessment & Plan Note (Addendum)
Diltiazem gel and sitz baths. Anal fissure handout provided. Reduce lomotil so he is not straining to stool

## 2014-01-22 NOTE — Assessment & Plan Note (Signed)
No signs of toxicity. Repeating an echocardiogram just to make sure his EF is stable may need to monitor that over time. I think the risk benefit ratio of Humira favors benefits at this time

## 2014-01-22 NOTE — Assessment & Plan Note (Signed)
Remains a problem, related to his overall disease issues. Discuss today but will call or recommend he take nutritional supplements like boost or Ensure at this point.

## 2014-01-22 NOTE — Patient Instructions (Signed)
Pick up the diltiazem gel at [facility] and apply as we discussed.  Reduce Lomotil so stools not so hard.  Soak in the tub some.  Call me next Monday with a symptom update and we will see about return to work.  You should hear about echo report by early next week at [place] your voicemail so people call the office and you do not have to call them back while out of work.  I appreciate the opportunity to care for you. Gatha Mayer, MD, [facility]

## 2014-01-22 NOTE — Assessment & Plan Note (Addendum)
Improved Resume Humira as I think CHF issues are not really his problem and unlikely to be from one dose of Humira. I was able to have a conversation with him in person about this. Keep 8/21 appt with me for followup Continue mercaptopurine Stay off the mesalamine

## 2014-01-26 ENCOUNTER — Ambulatory Visit (HOSPITAL_COMMUNITY): Payer: Managed Care, Other (non HMO) | Attending: Cardiology | Admitting: Cardiology

## 2014-01-26 DIAGNOSIS — R609 Edema, unspecified: Secondary | ICD-10-CM

## 2014-01-26 DIAGNOSIS — R0989 Other specified symptoms and signs involving the circulatory and respiratory systems: Principal | ICD-10-CM | POA: Insufficient documentation

## 2014-01-26 DIAGNOSIS — R0600 Dyspnea, unspecified: Secondary | ICD-10-CM

## 2014-01-26 DIAGNOSIS — R0602 Shortness of breath: Secondary | ICD-10-CM

## 2014-01-26 DIAGNOSIS — R0609 Other forms of dyspnea: Secondary | ICD-10-CM | POA: Diagnosis not present

## 2014-01-26 NOTE — Progress Notes (Signed)
Echo performed. 

## 2014-01-27 NOTE — Progress Notes (Signed)
Quick Note:  Echo is fine ______

## 2014-02-02 ENCOUNTER — Ambulatory Visit (INDEPENDENT_AMBULATORY_CARE_PROVIDER_SITE_OTHER): Payer: Managed Care, Other (non HMO) | Admitting: Internal Medicine

## 2014-02-02 ENCOUNTER — Encounter: Payer: Self-pay | Admitting: Internal Medicine

## 2014-02-02 VITALS — BP 108/70 | HR 91 | Temp 98.0°F | Ht 71.0 in | Wt 147.0 lb

## 2014-02-02 DIAGNOSIS — R0600 Dyspnea, unspecified: Secondary | ICD-10-CM

## 2014-02-02 DIAGNOSIS — R0609 Other forms of dyspnea: Secondary | ICD-10-CM

## 2014-02-02 DIAGNOSIS — R0989 Other specified symptoms and signs involving the circulatory and respiratory systems: Secondary | ICD-10-CM

## 2014-02-02 DIAGNOSIS — R918 Other nonspecific abnormal finding of lung field: Secondary | ICD-10-CM

## 2014-02-02 MED ORDER — ACLIDINIUM BROMIDE 400 MCG/ACT IN AEPB: 1.0000 | INHALATION_SPRAY | Freq: Two times a day (BID) | RESPIRATORY_TRACT | Status: DC

## 2014-02-02 NOTE — Patient Instructions (Addendum)
Stop spiriva tudorza Take 1 puff first thing in am and then another 1 puff  about 12 hours later.   Try off the dulera to see if it makes any difference and if so ok to restart  [place] socks in [place] perfect choice for elastic stockings  Please schedule a follow up office visit in 6 weeks, call sooner if needed with pfts

## 2014-02-02 NOTE — Progress Notes (Signed)
Subjective:    Patient ID: Sean Pope, male    DOB: [birth year]    MRN: [id number]  Brief patient profile:  71 yowm quit smoking 2013 no problem with ex tol or need for inhalers though no aerobics then stopped prednisne and  started humera 12/31/13  per Dr Carlean Purl for UC and 4 days later abrupt onset sob x 1 week prior to going to [facility] where admitted with dx of pna.    Admit date: 01/07/2014  Discharge date: 01/10/2014   Discharge Diagnoses:   Hypoxia  Respiratory failure, acute  PNA (pneumonia)   History of present illness:  68 y.o. male with PMH significant for Ulcerative colitis recently started on humira , malnutrition, CKD stage III Cr range 1.9 to 2.4, history of stroke, h/o tobacco use who presents complaining of SOB on exertion, weakness since 1 week prior to admission, fever  Hospital Course:  1. Fever/cough, clinically suspected PNA, CXR no clear infiltrate but CT: new pneumonia and a small pleural effusion posteriorly in the right lower lobe  -afebrile; improved on IV atx, blood cultures NGTD; cont inhalers, hold humira ? related to fever; TB skin test neg 11/2013  -changed to PO atx to complete outpatient treatment course  2. Acute respiratory failure likely COPD/emphysema with chronic tobacco use +PNA  -resolved; [place] leg neg DVT; VQ low to mod prob; unlikely due to VTE; since +pneumonia+COPD:  -cont bronchodilators, needs OP PFTs; home oxygen  3. CKD III, cont PO diuretics;  4. Leg edema likely due to CKD; no s/s of CHF; echo (10/2013): LVEF 55%. Wall motion was normal; there were no regional wall motionabnormalities.  -cont PO diuretics  5. IBD, no significant diarrhea; start Asacol; hold humira; OP GI follow up, tu resume humira  6. Anemia likely AoCD+ IDA; cont PO iron; no s/s of acute bleeding; GI follow up  7. CT: There is a stable right retrocrural lymph node since March of 2015. This has increased in size since April of 2014  -d/w patient; recommended to  repeat CT in 4-6 week for further evaluation, after resolution of PNA     01/19/2014 post hosp eval/ 1st  ov/Sean Pope re: f/u ? CAP/  fever resolved / back to baseline doe  Chief Complaint  Patient presents with  . Pulmonary Consult    Referred by Dr. Carlean Purl for dyspnea.   Onset of diarrhea x one year, dx UC Jan 2015  Doe on 02 x steps, does ok flat moderate x 2lpm  Using lots of albuterol in hfa/ neb ? Helps some not sure Never purulent sputum/ min assoc dry cough, better since discharge rec Work on inhaler technique:  relax and gently blow all the way out then take a nice smooth deep breath back in, triggering the inhaler at same time you start breathing in.  Hold for up to 5 seconds if you can.  Rinse and gargle with water when done dulera 100 Take 2 puffs first thing in am and then another 2 puffs about 12 hours later.  spiriva one capsule each am Only use your albuterol (ventolin) as a rescue medication  02 2 lpm at bedtime and as needed during the day - pace yourself when walking         02/02/2014 f/u ov/Sean Pope re: ? Asthma vs copd  Chief Complaint  Patient presents with  . Follow-up    No complaints. Using O2 2L at night only--reports he does not have to use during the  daytime. Some SOB with heavier exertion.       No obvious day to day or daytime variabilty or assoc chronic cough or cp or chest tightness, subjective wheeze overt sinus or hb symptoms. No unusual exp hx or h/o childhood pna/ asthma or knowledge of premature birth.  Sleeping ok without nocturnal  or early am exacerbation  of respiratory  c/o's or need for noct saba. Also denies any obvious fluctuation of symptoms with weather or environmental changes or other aggravating or alleviating factors except as outlined above   Current Medications, Allergies, Complete Past Medical History, Past Surgical History, Family History, and Social History were reviewed in [employer] record.  ROS  The  following are not active complaints unless bolded sore throat, dysphagia, dental problems, itching, sneezing,  nasal congestion or excess/ purulent secretions, ear ache,   fever, chills, sweats, unintended wt loss, pleuritic or exertional cp, hemoptysis,  orthopnea pnd or leg swelling, presyncope, palpitations, heartburn, abdominal pain, anorexia, nausea, vomiting, diarrhea  or change in bowel or urinary habits, change in stools or urine, dysuria,hematuria,  rash, arthralgias, visual complaints, headache, numbness weakness or ataxia or problems with walking or coordination,  change in mood/affect or memory.                    Objective:   Physical Exam  amb wm nad   02/02/2014        147  Wt Readings from Last 3 Encounters:  01/19/14 142 lb (64.411 kg)  01/10/14 145 lb 9.6 oz (66.044 kg)  01/05/14 148 lb 9.6 oz (67.405 kg)      HEENT: nl dentition, turbinates, and orophanx. Nl external ear canals without cough reflex   NECK :  without JVD/Nodes/TM/ nl carotid upstrokes bilaterally   LUNGS: no acc muscle use, clear to A and P bilaterally without cough on insp or exp maneuvers   CV:  RRR  no s3 or murmur or increase in P2, 2 Plus pitting bilaterl LE  edema   ABD:  soft and nontender with nl excursion in the supine position. No bruits or organomegaly, bowel sounds nl  MS:  warm without deformities, calf tenderness, cyanosis or clubbing  SKIN: warm and dry without lesions    NEURO:  alert, approp, no deficits      cxr  01/14/14 COPD. There is no definite evidence of pneumonia.    Chemistry      Component Value Date/Time   NA 139 01/14/2014 0828   K 3.7 01/14/2014 0828   CL 102 01/14/2014 0828   CO2 29 01/14/2014 0828   BUN 25* 01/14/2014 0828   CREATININE 1.8* 01/14/2014 0828      Component Value Date/Time   CALCIUM 8.9 01/14/2014 0828   ALKPHOS 71 01/07/2014 1150   AST 38* 01/07/2014 1150   ALT 42 01/07/2014 1150   BILITOT 0.3 01/07/2014 1150       Lab Results    Component Value Date   HGB 10.0* 01/14/2014   HGB 8.4* 01/10/2014   HGB 7.7* 01/09/2014    Lab Results  Component Value Date   PROBNP 635.8* 01/07/2014       Assessment & Plan:

## 2014-02-03 ENCOUNTER — Telehealth: Payer: Self-pay | Admitting: Internal Medicine

## 2014-02-03 DIAGNOSIS — R0600 Dyspnea, unspecified: Secondary | ICD-10-CM

## 2014-02-03 NOTE — Assessment & Plan Note (Signed)
Resolved

## 2014-02-03 NOTE — Assessment & Plan Note (Addendum)
-   Echo mild RAE 10/29/13 with VQ low / intermediate  01/08/14  - 01/19/2014   Walked RA x one lap @ slow pace @ 185 ft stopped due to  Pulse 154 but not sob or desat  - 01/19/2014 p extensive coaching HFA effectiveness =    75% > try dulera 100> improved but not clear this is the reason and dry mouth on spiriva so try to consolidate rx to tudorza one bid and see what happens off dulera and if worse restart pending f/u pfts  The proper method of use, as well as anticipated side effects, of a metered-dose inhaler are discussed and demonstrated to the patient. Improved effectiveness after extensive coaching during this visit to a level of approximately  90%

## 2014-02-03 NOTE — Telephone Encounter (Signed)
Yes, d/c 02

## 2014-02-03 NOTE — Telephone Encounter (Signed)
Pt would like to know if he can stop using his O2 at night now; states he forgot to ask MW yesterday at his OV. Pt states MW had him stop O2 during the day in the past.   MW please advise. Thanks.

## 2014-02-03 NOTE — Telephone Encounter (Signed)
Called spoke with pt. Aware order placed. Nothing further needed

## 2014-02-05 ENCOUNTER — Telehealth: Payer: Self-pay | Admitting: Internal Medicine

## 2014-02-05 ENCOUNTER — Other Ambulatory Visit (INDEPENDENT_AMBULATORY_CARE_PROVIDER_SITE_OTHER): Payer: Managed Care, Other (non HMO)

## 2014-02-05 ENCOUNTER — Ambulatory Visit (INDEPENDENT_AMBULATORY_CARE_PROVIDER_SITE_OTHER): Payer: Managed Care, Other (non HMO) | Admitting: Internal Medicine

## 2014-02-05 ENCOUNTER — Encounter: Payer: Self-pay | Admitting: Internal Medicine

## 2014-02-05 VITALS — BP 102/68 | HR 96 | Ht 68.25 in | Wt 146.2 lb

## 2014-02-05 DIAGNOSIS — N183 Chronic kidney disease, stage 3 unspecified: Secondary | ICD-10-CM

## 2014-02-05 DIAGNOSIS — D500 Iron deficiency anemia secondary to blood loss (chronic): Secondary | ICD-10-CM

## 2014-02-05 DIAGNOSIS — K602 Anal fissure, unspecified: Secondary | ICD-10-CM

## 2014-02-05 DIAGNOSIS — E43 Unspecified severe protein-calorie malnutrition: Secondary | ICD-10-CM

## 2014-02-05 DIAGNOSIS — K51018 Ulcerative (chronic) pancolitis with other complication: Secondary | ICD-10-CM

## 2014-02-05 DIAGNOSIS — K5100 Ulcerative (chronic) pancolitis without complications: Secondary | ICD-10-CM

## 2014-02-05 LAB — CBC WITH DIFFERENTIAL/PLATELET
BASOS PCT: 0.8 % (ref 0.0–3.0)
Basophils Absolute: 0.1 10*3/uL (ref 0.0–0.1)
EOS PCT: 0.8 % (ref 0.0–5.0)
Eosinophils Absolute: 0.1 10*3/uL (ref 0.0–0.7)
HCT: 28.1 % — ABNORMAL LOW (ref 39.0–52.0)
Hemoglobin: 8.9 g/dL — ABNORMAL LOW (ref 13.0–17.0)
Lymphocytes Relative: 15.1 % (ref 12.0–46.0)
Lymphs Abs: 2.2 10*3/uL (ref 0.7–4.0)
MCHC: 31.8 g/dL (ref 30.0–36.0)
MCV: 92.1 fl (ref 78.0–100.0)
MONOS PCT: 9 % (ref 3.0–12.0)
Monocytes Absolute: 1.3 10*3/uL — ABNORMAL HIGH (ref 0.1–1.0)
NEUTROS PCT: 74.3 % (ref 43.0–77.0)
Neutro Abs: 10.7 10*3/uL — ABNORMAL HIGH (ref 1.4–7.7)
PLATELETS: 684 10*3/uL — AB (ref 150.0–400.0)
RBC: 3.06 Mil/uL — AB (ref 4.22–5.81)
RDW: 18 % — ABNORMAL HIGH (ref 11.5–15.5)
WBC: 14.4 10*3/uL — AB (ref 4.0–10.5)

## 2014-02-05 LAB — RENAL FUNCTION PANEL
Albumin: 2.6 g/dL — ABNORMAL LOW (ref 3.5–5.2)
BUN: 26 mg/dL — ABNORMAL HIGH (ref 6–23)
CALCIUM: 8.9 mg/dL (ref 8.4–10.5)
CHLORIDE: 100 meq/L (ref 96–112)
CO2: 30 meq/L (ref 19–32)
Creatinine, Ser: 1.8 mg/dL — ABNORMAL HIGH (ref 0.4–1.5)
GFR: 40.28 mL/min — ABNORMAL LOW (ref >60.00)
GLUCOSE: 97 mg/dL (ref 70–99)
POTASSIUM: 3.8 meq/L (ref 3.5–5.1)
Phosphorus: 2.9 mg/dL (ref 2.3–4.6)
SODIUM: 136 meq/L (ref 135–145)

## 2014-02-05 NOTE — Assessment & Plan Note (Signed)
cbc

## 2014-02-05 NOTE — Telephone Encounter (Signed)
Per Dr. Carlean Purl we will work him in.

## 2014-02-05 NOTE — Progress Notes (Signed)
Subjective:    Patient ID: Sean Pope, male    DOB: 30-Jan-1946, 68 y.o.   MRN: [id number]  HPI Sean Pope is here with his daughter. 5 stools/day sometimes strains. No bleeding. Formed to loose stools. Using less Lomotil Wants to go back to work "stir crazy" Edema less, energy better. Denies dyspnea with walking - limit of exertion.  No Known Allergies Outpatient Prescriptions Prior to Visit  Medication Sig Dispense Refill  . Aclidinium Bromide (TUDORZA PRESSAIR) 400 MCG/ACT AEPB Inhale 1 puff into the lungs 2 (two) times daily. One twice daily  1 each  11  . Adalimumab (HUMIRA PEN) 40 MG/0.8ML PNKT Inject 40 mg into the skin every 14 (fourteen) days.  2 each    . albuterol (PROVENTIL HFA;VENTOLIN HFA) 108 (90 BASE) MCG/ACT inhaler Inhale 2 puffs into the lungs every 6 (six) hours as needed for wheezing or shortness of breath.  1 Inhaler  2  . diltiazem 2 % GEL Apply 1 application topically 2 (two) times daily.  30 g  2  . diphenoxylate-atropine (LOMOTIL) 2.5-0.025 MG per tablet Take by mouth 4 (four) times daily as needed for diarrhea or loose stools.      . famotidine (PEPCID) 20 MG tablet Take 1 tablet by mouth daily.      . Ferrous Sulfate (IRON) 325 (65 FE) MG TABS Take 1 tablet by mouth 2 (two) times daily.       . furosemide (LASIX) 40 MG tablet Take 80 mg by mouth daily.      . mercaptopurine (PURINETHOL) 50 MG tablet Take 1.5 tablets (75 mg total) by mouth daily. Give on an empty stomach 1 hour before or 2 hours after meals. Caution: Chemotherapy.  45 tablet  2  . Multiple Vitamins-Minerals (MENS MULTIVITAMIN PLUS PO) Take 1 tablet by mouth.      . potassium chloride 20 MEQ TBCR Take 20 mEq by mouth daily.  30 tablet  1  . simvastatin (ZOCOR) 40 MG tablet Take 40 mg by mouth every evening.       No facility-administered medications prior to visit.   Past Medical History  Diagnosis Date  . Stroke   . SAH (subarachnoid hemorrhage)   . Hypertension   . Hyperlipidemia   .  Lung collapse 09/26/2011  . Acute respiratory failure with hypoxia 09/17/2011  . 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 Ulcerative colitis 05/28/2013  . AKI (acute kidney injury)     In the setting of ulcerative colitis flare, question relationship to mesalamine  . Long-term use of immunosuppressant medication-mercaptopurine and Humira 01/05/2014  . Anemia-multifactorial 01/05/2014    Blood loss, medications, chronic illness kidney injury  . Anal fissure - posterior 01/22/2014  . Respiratory failure, acute 01/07/2014  . Chronic kidney disease, stage III (moderate) 09/08/2013   Past Surgical History  Procedure Laterality Date  . Aneurysm coiling  2013    Posterior communicating  . Colonoscopy w/ biopsies  05/28/2013         Review of Systems As above    Objective:   Physical Exam General:  Thin and in no acute distress Eyes:  anicteric. Lungs: Clear to auscultation bilaterally with decreased BS throughout Heart:  S1S2, no rubs, murmurs, gallops. Abdomen:  soft, non-tender, no hepatosplenomegaly, hernia, or mass and BS+.  Rectal: + posterior sentinel pile and fissure Extremities:   1+ bilat LE edema Skin   + purpura arms Neuro:  A&O x 3.  Psych:  appropriate mood and  Affect.  Data Reviewed: Past notes, renal notes, labs       Assessment & Plan:  Ulcerative chronic pancolitis Improved - continue current meds Humira and 6 MP  RTC 1 month  Anal fissure - posterior Improved Stay on diltiazem gel bid not qod  Anemia-multifactorial cbc  Protein-calorie malnutrition, severe Nepro supplement?   Chronic kidney disease, stage III (moderate) Check labs, fluid restrict F/U Dr. Posey Pronto   Cc: Dr. Elmarie Shiley and Dr. Kathyrn Lass

## 2014-02-05 NOTE — Assessment & Plan Note (Signed)
Nepro supplement?

## 2014-02-05 NOTE — Assessment & Plan Note (Addendum)
Improved - continue current meds Humira and 6 MP  RTC 1 month

## 2014-02-05 NOTE — Assessment & Plan Note (Signed)
Improved Stay on diltiazem gel bid not qod

## 2014-02-05 NOTE — Assessment & Plan Note (Signed)
Check labs, fluid restrict F/U Dr. Posey Pronto

## 2014-02-05 NOTE — Patient Instructions (Addendum)
Your physician has requested that you go to the basement for the following lab work before leaving today: CBC/diff, Renal Function Panel  Use your diltiazem gel twice a day and refill it if you run out.  We are providing you with a letter to return to work.   Follow up with [place] in a month, appointment made for 03/10/14.    I appreciate the opportunity to care for you.

## 2014-02-08 NOTE — Progress Notes (Signed)
Quick Note:  Kidney function the same - stable  Hgb a little lower - stay on iro - may need a supplement called EPO - would be rx by Dr. Posey Pronto most likely - it helps bone marrow make RBC's ______

## 2014-02-17 ENCOUNTER — Ambulatory Visit: Payer: Medicare HMO | Admitting: Internal Medicine

## 2014-03-10 ENCOUNTER — Ambulatory Visit (INDEPENDENT_AMBULATORY_CARE_PROVIDER_SITE_OTHER): Payer: Managed Care, Other (non HMO) | Admitting: Internal Medicine

## 2014-03-10 ENCOUNTER — Encounter: Payer: Self-pay | Admitting: Internal Medicine

## 2014-03-10 VITALS — BP 130/72 | HR 88 | Ht 68.25 in | Wt 150.4 lb

## 2014-03-10 DIAGNOSIS — K51011 Ulcerative (chronic) pancolitis with rectal bleeding: Secondary | ICD-10-CM

## 2014-03-10 DIAGNOSIS — K602 Anal fissure, unspecified: Secondary | ICD-10-CM

## 2014-03-10 DIAGNOSIS — R609 Edema, unspecified: Secondary | ICD-10-CM

## 2014-03-10 DIAGNOSIS — E43 Unspecified severe protein-calorie malnutrition: Secondary | ICD-10-CM

## 2014-03-10 DIAGNOSIS — K5100 Ulcerative (chronic) pancolitis without complications: Secondary | ICD-10-CM

## 2014-03-10 DIAGNOSIS — D649 Anemia, unspecified: Secondary | ICD-10-CM

## 2014-03-10 DIAGNOSIS — M7989 Other specified soft tissue disorders: Secondary | ICD-10-CM

## 2014-03-10 NOTE — Assessment & Plan Note (Addendum)
Await labs w/ Posey Pronto when he sees him for nephrology visit ? Needs EPO support + iron, think he may be a good candidate

## 2014-03-10 NOTE — Patient Instructions (Signed)
Glad to see things are better.  1) Get a flu shot soon 2) Ask Dr. Posey Pronto about using erythropoietin to help blood counts (anemia)   Call in early November to get a December appointment - sooner if needed.  I appreciate the opportunity to care for you. Gatha Mayer, MD, [facility]

## 2014-03-10 NOTE — Assessment & Plan Note (Addendum)
Improving as UC does Stay on diltiazem gel for now

## 2014-03-10 NOTE — Progress Notes (Signed)
Subjective:    Patient ID: Sean Pope, male    DOB: 08-15-1945, 68 y.o.   MRN: [id number]  HPI Here for f/u ulcerative colitis, anemia, weight loss/malnutrition. On Humira. Back to work after visit in August. That is going well. He is stronger. Stools forming up. No bleeding 1 Lomotil qd usu, occasionally a second Rectal pain better  Wt Readings from Last 3 Encounters:  03/10/14 150 lb 6 oz (68.21 kg)  02/05/14 146 lb 4 oz (66.339 kg)  02/02/14 147 lb (66.679 kg)   No Known Allergies Outpatient Prescriptions Prior to Visit  Medication Sig Dispense Refill  . Aclidinium Bromide (TUDORZA PRESSAIR) 400 MCG/ACT AEPB Inhale 1 puff into the lungs 2 (two) times daily. One twice daily  1 each  11  . Adalimumab (HUMIRA PEN) 40 MG/0.8ML PNKT Inject 40 mg into the skin every 14 (fourteen) days.  2 each    . albuterol (PROVENTIL HFA;VENTOLIN HFA) 108 (90 BASE) MCG/ACT inhaler Inhale 2 puffs into the lungs every 6 (six) hours as needed for wheezing or shortness of breath.  1 Inhaler  2  . diltiazem 2 % GEL Apply 1 application topically 2 (two) times daily.  30 g  2  . diphenoxylate-atropine (LOMOTIL) 2.5-0.025 MG per tablet Take by mouth 4 (four) times daily as needed for diarrhea or loose stools.      . famotidine (PEPCID) 20 MG tablet Take 1 tablet by mouth daily.      . Ferrous Sulfate (IRON) 325 (65 FE) MG TABS Take 1 tablet by mouth 2 (two) times daily.       . furosemide (LASIX) 40 MG tablet Take 80 mg by mouth daily.      . mercaptopurine (PURINETHOL) 50 MG tablet Take 1.5 tablets (75 mg total) by mouth daily. Give on an empty stomach 1 hour before or 2 hours after meals. Caution: Chemotherapy.  45 tablet  2  . Multiple Vitamins-Minerals (MENS MULTIVITAMIN PLUS PO) Take 1 tablet by mouth.      . potassium chloride 20 MEQ TBCR Take 20 mEq by mouth daily.  30 tablet  1  . simvastatin (ZOCOR) 40 MG tablet Take 40 mg by mouth every evening.       No facility-administered medications prior  to visit.   Past Medical History  Diagnosis Date  . Stroke   . SAH (subarachnoid hemorrhage)   . Hypertension   . Hyperlipidemia   . Lung collapse 09/26/2011  . Acute respiratory failure with hypoxia 09/17/2011  . 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 Ulcerative colitis 05/28/2013  . AKI (acute kidney injury)     In the setting of ulcerative colitis flare, question relationship to mesalamine  . Long-term use of immunosuppressant medication-mercaptopurine and Humira 01/05/2014  . Anemia-multifactorial 01/05/2014    Blood loss, medications, chronic illness kidney injury  . Anal fissure - posterior 01/22/2014  . Respiratory failure, acute 01/07/2014  . Chronic kidney disease, stage III (moderate) 09/08/2013   Past Surgical History  Procedure Laterality Date  . Aneurysm coiling  2013    Posterior communicating  . Colonoscopy w/ biopsies  05/28/2013       Review of Systems Legs week or fatigued still    Objective:   Physical Exam General:  NAD Lungs:  clear Heart:  S1S2 no rubs, murmurs or gallops Abdomen:  soft and nontender, BS+ Ext:   no edema    Data Reviewed:   Prior labs, nephrology notes, my notes  Assessment & Plan:  Ulcerative chronic pancolitis Improving significantly, maybe turning the corner and seeing benefits of Humira   Anal fissure - posterior Improving as UC does Stay on diltiazem gel for now  Anemia-multifactorial Await labs w/ Posey Pronto when he sees him for nephrology visit ? Needs EPO support + iron, think he may be a good candidate  Leg swelling Better as protein stores improve  Protein-calorie malnutrition, severe Improving as colitis improves  Edema Better as protein stores improve   RTC 3 months sooner prn KF:[id number], Serina Cowper, MD Elmarie Shiley, MD

## 2014-03-10 NOTE — Assessment & Plan Note (Addendum)
Improving significantly, maybe turning the corner and seeing benefits of Humira

## 2014-03-12 ENCOUNTER — Ambulatory Visit: Payer: Medicare Other | Admitting: Internal Medicine

## 2014-03-12 ENCOUNTER — Other Ambulatory Visit: Payer: Self-pay | Admitting: *Deleted

## 2014-03-12 MED ORDER — FUROSEMIDE 40 MG PO TABS: 80.0000 mg | ORAL_TABLET | Freq: Every day | ORAL | Status: DC

## 2014-03-12 NOTE — Assessment & Plan Note (Signed)
Improving as colitis improves

## 2014-03-12 NOTE — Assessment & Plan Note (Signed)
Better as protein stores improve

## 2014-03-15 ENCOUNTER — Other Ambulatory Visit: Payer: Self-pay

## 2014-03-15 MED ORDER — POTASSIUM CHLORIDE ER 20 MEQ PO TBCR: 20.0000 meq | EXTENDED_RELEASE_TABLET | Freq: Every day | ORAL | Status: DC

## 2014-03-15 NOTE — Telephone Encounter (Signed)
Per Dr. Carlean Purl ok to refill 3 times.

## 2014-03-16 ENCOUNTER — Ambulatory Visit: Payer: Medicare Other | Admitting: Internal Medicine

## 2014-04-05 ENCOUNTER — Ambulatory Visit: Payer: Managed Care, Other (non HMO) | Admitting: Internal Medicine

## 2014-04-23 ENCOUNTER — Other Ambulatory Visit (HOSPITAL_COMMUNITY): Payer: Self-pay | Admitting: *Deleted

## 2014-04-23 ENCOUNTER — Encounter: Payer: Self-pay | Admitting: Internal Medicine

## 2014-04-23 ENCOUNTER — Ambulatory Visit (INDEPENDENT_AMBULATORY_CARE_PROVIDER_SITE_OTHER): Payer: Managed Care, Other (non HMO) | Admitting: Internal Medicine

## 2014-04-23 VITALS — BP 112/78 | HR 78 | Ht 68.0 in | Wt 147.0 lb

## 2014-04-23 DIAGNOSIS — R918 Other nonspecific abnormal finding of lung field: Secondary | ICD-10-CM

## 2014-04-23 DIAGNOSIS — J432 Centrilobular emphysema: Secondary | ICD-10-CM

## 2014-04-23 DIAGNOSIS — R0600 Dyspnea, unspecified: Secondary | ICD-10-CM

## 2014-04-23 DIAGNOSIS — Z23 Encounter for immunization: Secondary | ICD-10-CM

## 2014-04-23 LAB — PULMONARY FUNCTION TEST
DL/VA % pred: 47 %
DL/VA: 2.12 ml/min/mmHg/L
DLCO UNC % PRED: 38 %
DLCO UNC: 11.76 ml/min/mmHg
FEF 25-75 POST: 0.61 L/s
FEF 25-75 PRE: 0.48 L/s
FEF2575-%Change-Post: 26 %
FEF2575-%Pred-Post: 25 %
FEF2575-%Pred-Pre: 20 %
FEV1-%Change-Post: 8 %
FEV1-%PRED-POST: 41 %
FEV1-%Pred-Pre: 37 %
FEV1-Post: 1.29 L
FEV1-Pre: 1.18 L
FEV1FVC-%Change-Post: 0 %
FEV1FVC-%Pred-Pre: 55 %
FEV6-%Change-Post: 8 %
FEV6-%PRED-PRE: 67 %
FEV6-%Pred-Post: 72 %
FEV6-POST: 2.9 L
FEV6-Pre: 2.68 L
FEV6FVC-%CHANGE-POST: 0 %
FEV6FVC-%PRED-POST: 99 %
FEV6FVC-%Pred-Pre: 99 %
FVC-%Change-Post: 8 %
FVC-%PRED-POST: 73 %
FVC-%Pred-Pre: 67 %
FVC-PRE: 2.86 L
FVC-Post: 3.09 L
PRE FEV6/FVC RATIO: 94 %
Post FEV1/FVC ratio: 42 %
Post FEV6/FVC ratio: 94 %
Pre FEV1/FVC ratio: 41 %
RV % PRED: 170 %
RV: 4 L
TLC % pred: 113 %
TLC: 7.66 L

## 2014-04-23 NOTE — Progress Notes (Signed)
PFT done today. 

## 2014-04-23 NOTE — Patient Instructions (Addendum)
prevnar today  Try off tudorza to see what difference if any it makes in your activity tolerance and if breathing worse restart it  Only use your albuterol as a rescue medication to be used if you can't catch your breath by resting or doing a relaxed purse lip breathing pattern.  - The less you use it, the better it will work when you need it. - Ok to use up to 2 puffs  every 4 hours if you must but call for immediate appointment if use goes up over your usual need - Don't leave home without it !!  (think of it like the spare tire for your car)    If you are satisfied with your treatment plan,  let your doctor know and he/she can either refill your medications or you can return here when your prescription runs out.     If in any way you are not 100% satisfied,  please tell us.  If 100% better, tell your friends!  Pulmonary follow up is as needed

## 2014-04-23 NOTE — Progress Notes (Addendum)
Subjective:    Patient ID: Sean Pope, male    DOB: [birth year]    MRN: [id number]  Brief patient profile:  53 yowm quit smoking 2013 no problem with ex tol  Could still do 8 min mile or need for inhalers   then stopped prednisne and  started humera 12/31/13  per Dr Carlean Purl for UC and 4 days later abrupt onset sob x 1 week prior to going to [facility] where admitted with dx of pna and proved to have GOLD III copd 04/23/14     Admit date: 01/07/2014  Discharge date: 01/10/2014   Discharge Diagnoses:   Hypoxia  Respiratory failure, acute  PNA (pneumonia)   History of present illness:  68 y.o. male with PMH significant for Ulcerative colitis recently started on humira , malnutrition, CKD stage III Cr range 1.9 to 2.4, history of stroke, h/o tobacco use who presents complaining of SOB on exertion, weakness since 1 week prior to admission, fever  Hospital Course:  1. Fever/cough, clinically suspected PNA, CXR no clear infiltrate but CT: new pneumonia and a small pleural effusion posteriorly in the right lower lobe  -afebrile; improved on IV atx, blood cultures NGTD; cont inhalers, hold humira ? related to fever; TB skin test neg 11/2013  -changed to PO atx to complete outpatient treatment course  2. Acute respiratory failure likely COPD/emphysema with chronic tobacco use +PNA  -resolved; [place] leg neg DVT; VQ low to mod prob; unlikely due to VTE; since +pneumonia+COPD:  -cont bronchodilators, needs OP PFTs; home oxygen  3. CKD III, cont PO diuretics;  4. Leg edema likely due to CKD; no s/s of CHF; echo (10/2013): LVEF 55%. Wall motion was normal; there were no regional wall motionabnormalities.  -cont PO diuretics  5. IBD, no significant diarrhea; start Asacol; hold humira; OP GI follow up, tu resume humira  6. Anemia likely AoCD+ IDA; cont PO iron; no s/s of acute bleeding; GI follow up  7. CT: There is a stable right retrocrural lymph node since March of 2015. This has increased in size  since April of 2014  -d/w patient; recommended to repeat CT in 4-6 week for further evaluation, after resolution of PNA     01/19/2014 post hosp eval/ 1st  ov/Sean Pope re: f/u ? CAP/  fever resolved / back to baseline doe  Chief Complaint  Patient presents with  . Pulmonary Consult    Referred by Dr. Carlean Purl for dyspnea.   Onset of diarrhea x one year, dx UC Jan 2015  Doe on 02 x steps, does ok flat moderate x 2lpm  Using lots of albuterol in hfa/ neb ? Helps some not sure Never purulent sputum/ min assoc dry cough, better since discharge rec Work on inhaler technique:  relax and gently blow all the way out then take a nice smooth deep breath back in, triggering the inhaler at same time you start breathing in.  Hold for up to 5 seconds if you can.  Rinse and gargle with water when done dulera 100 Take 2 puffs first thing in am and then another 2 puffs about 12 hours later.  spiriva one capsule each am Only use your albuterol (ventolin) as a rescue medication  02 2 lpm at bedtime and as needed during the day - pace yourself when walking         02/02/2014 f/u ov/Mitch Arquette re: ? Asthma vs copd  Chief Complaint  Patient presents with  . Follow-up    No  complaints. Using O2 2L at night only--reports he does not have to use during the daytime. Some SOB with heavier exertion.   rec Stop spiriva tudorza Take 1 puff first thing in am and then another 1 puff  about 12 hours later.  Try off the dulera to see if it makes any difference and if so ok to restart    04/23/2014 f/u ov/Niva Murren re: COPD GOLD III  Chief Complaint  Patient presents with  . Follow-up    PFT done today. Pt staes that his breathing is doing well and he denies any co's today.   Not limited by breathing from desired activities   - no need at all for saba at this point   No obvious day to day or daytime variabilty or assoc chronic cough or cp or chest tightness, subjective wheeze overt sinus or hb symptoms. No unusual exp hx or  h/o childhood pna/ asthma or knowledge of premature birth.  Sleeping ok without nocturnal  or early am exacerbation  of respiratory  c/o's or need for noct saba. Also denies any obvious fluctuation of symptoms with weather or environmental changes or other aggravating or alleviating factors except as outlined above   Current Medications, Allergies, Complete Past Medical History, Past Surgical History, Family History, and Social History were reviewed in [employer] record.  ROS  The following are not active complaints unless bolded sore throat, dysphagia, dental problems, itching, sneezing,  nasal congestion or excess/ purulent secretions, ear ache,   fever, chills, sweats, unintended wt loss, pleuritic or exertional cp, hemoptysis,  orthopnea pnd or leg swelling, presyncope, palpitations, heartburn, abdominal pain, anorexia, nausea, vomiting, diarrhea  or change in bowel or urinary habits, change in stools or urine, dysuria,hematuria,  rash, arthralgias, visual complaints, headache, numbness weakness or ataxia or problems with walking or coordination,  change in mood/affect or memory.                    Objective:   Physical Exam  amb wm nad   02/02/2014        147  > 04/23/14 148  Wt Readings from Last 3 Encounters:  01/19/14 142 lb (64.411 kg)  01/10/14 145 lb 9.6 oz (66.044 kg)  01/05/14 148 lb 9.6 oz (67.405 kg)      HEENT: nl dentition, turbinates, and orophanx. Nl external ear canals without cough reflex   NECK :  without JVD/Nodes/TM/ nl carotid upstrokes bilaterally   LUNGS: no acc muscle use, clear to A and P bilaterally without cough on insp or exp maneuvers   CV:  RRR  no s3 or murmur or increase in P2, 1 Plus pitting bilaterl LE  edema   ABD:  soft and nontender with nl excursion in the supine position. No bruits or organomegaly, bowel sounds nl  MS:  warm without deformities, calf tenderness, cyanosis or clubbing  SKIN: warm and dry  without lesions    NEURO:  alert, approp, no deficits      cxr  01/14/14 COPD. There is no definite evidence of pneumonia.    Chemistry      Component Value Date/Time   NA 139 01/14/2014 0828   K 3.7 01/14/2014 0828   CL 102 01/14/2014 0828   CO2 29 01/14/2014 0828   BUN 25* 01/14/2014 0828   CREATININE 1.8* 01/14/2014 0828      Component Value Date/Time   CALCIUM 8.9 01/14/2014 0828   ALKPHOS 71 01/07/2014 1150   AST  38* 01/07/2014 1150   ALT 42 01/07/2014 1150   BILITOT 0.3 01/07/2014 1150       Lab Results  Component Value Date   HGB 10.0* 01/14/2014   HGB 8.4* 01/10/2014   HGB 7.7* 01/09/2014    Lab Results  Component Value Date   PROBNP 635.8* 01/07/2014       Assessment & Plan:

## 2014-04-24 NOTE — Assessment & Plan Note (Signed)
-   Echo mild RAE 10/29/13 with VQ low / intermediate  01/08/14  - 01/19/2014   Walked RA x one lap @ slow pace @ 185 ft stopped due to  Pulse 154 but not sob or desat  - 01/19/2014 p extensive coaching HFA effectiveness =    75% > try dulera 100> improved 02/02/14 on spiriva also with dry mouth - trial of tudorza one bid 02/03/2014 >>> - stop all 02 02/03/2014  - PFTs 04/23/2014  FEV1 1.29 (41%) ratio 42 not better p saba  and DLCO 38 corrects to 47 s tudorza that day - Pt requesting trial off maint rx 04/23/14 > try off tudorza  He is not convinced at this point he is better on tudorza so ok to try off using his ex tol and need for saba as indicators he should resume maint rx with turdorza

## 2014-04-26 ENCOUNTER — Ambulatory Visit (HOSPITAL_COMMUNITY)
Admission: RE | Admit: 2014-04-26 | Discharge: 2014-04-26 | Disposition: A | Discharge status: Home / Self Care | Payer: Managed Care, Other (non HMO) | Source: Ambulatory Visit | Attending: Nephrology | Admitting: Nephrology

## 2014-04-26 ENCOUNTER — Telehealth: Payer: Self-pay

## 2014-04-26 DIAGNOSIS — N183 Chronic kidney disease, stage 3 (moderate): Secondary | ICD-10-CM | POA: Insufficient documentation

## 2014-04-26 DIAGNOSIS — D631 Anemia in chronic kidney disease: Secondary | ICD-10-CM | POA: Insufficient documentation

## 2014-04-26 LAB — POCT HEMOGLOBIN-HEMACUE: Hemoglobin: 8.1 g/dL — ABNORMAL LOW (ref 13.0–17.0)

## 2014-04-26 MED ORDER — SODIUM CHLORIDE 0.9 % IV SOLN
1020.0000 mg | Freq: Once | INTRAVENOUS | Status: AC
  Administered 2014-04-26: 1020 mg via INTRAVENOUS
  Filled 2014-04-26: qty 34

## 2014-04-26 MED ORDER — EPOETIN ALFA 10000 UNIT/ML IJ SOLN
INTRAMUSCULAR | Status: AC
  Administered 2014-04-26: 10000 [IU] via SUBCUTANEOUS
  Filled 2014-04-26: qty 1

## 2014-04-26 MED ORDER — EPOETIN ALFA 10000 UNIT/ML IJ SOLN: 10000.0000 [IU] | INTRAMUSCULAR | Status: DC

## 2014-04-26 NOTE — Discharge Instructions (Signed)
Epoetin Alfa injection What is this medicine? EPOETIN ALFA (e POE e tin AL fa) helps your body make more red blood cells. This medicine is used to treat anemia caused by chronic kidney failure, cancer chemotherapy, or HIV-therapy. It may also be used before surgery if you have anemia. This medicine may be used for other purposes; ask your health care provider or pharmacist if you have questions. COMMON BRAND NAME(S): Epogen, Procrit What should I tell my health care provider before I take this medicine? They need to know if you have any of these conditions: -blood clotting disorders -cancer patient not on chemotherapy -cystic fibrosis -heart disease, such as angina or heart failure -hemoglobin level of 12 g/dL or greater -high blood pressure -low levels of folate, iron, or vitamin B12 -seizures -an unusual or allergic reaction to erythropoietin, albumin, benzyl alcohol, hamster proteins, other medicines, foods, dyes, or preservatives -pregnant or trying to get pregnant -breast-feeding How should I use this medicine? This medicine is for injection into a vein or under the skin. It is usually given by a health care professional in a hospital or clinic setting. If you get this medicine at home, you will be taught how to prepare and give this medicine. Use exactly as directed. Take your medicine at regular intervals. Do not take your medicine more often than directed. It is important that you put your used needles and syringes in a special sharps container. Do not put them in a trash can. If you do not have a sharps container, call your pharmacist or healthcare provider to get one. Talk to your pediatrician regarding the use of this medicine in children. While this drug may be prescribed for selected conditions, precautions do apply. Overdosage: If you think you have taken too much of this medicine contact a poison control center or emergency room at once. NOTE: This medicine is only for you. Do  not share this medicine with others. What if I miss a dose? If you miss a dose, take it as soon as you can. If it is almost time for your next dose, take only that dose. Do not take double or extra doses. What may interact with this medicine? Do not take this medicine with any of the following medications: -darbepoetin alfa This list may not describe all possible interactions. Give your health care provider a list of all the medicines, herbs, non-prescription drugs, or dietary supplements you use. Also tell them if you smoke, drink alcohol, or use illegal drugs. Some items may interact with your medicine. What should I watch for while using this medicine? Visit your prescriber or health care professional for regular checks on your progress and for the needed blood tests and blood pressure measurements. It is especially important for the doctor to make sure your hemoglobin level is in the desired range, to limit the risk of potential side effects and to give you the best benefit. Keep all appointments for any recommended tests. Check your blood pressure as directed. Ask your doctor what your blood pressure should be and when you should contact him or her. As your body makes more red blood cells, you may need to take iron, folic acid, or vitamin B supplements. Ask your doctor or health care provider which products are right for you. If you have kidney disease continue dietary restrictions, even though this medication can make you feel better. Talk with your doctor or health care professional about the foods you eat and the vitamins that you take. What  side effects may I notice from receiving this medicine? Side effects that you should report to your doctor or health care professional as soon as possible: -allergic reactions like skin rash, itching or hives, swelling of the face, lips, or tongue -breathing problems -changes in vision -chest pain -confusion, trouble speaking or understanding -feeling  faint or lightheaded, falls -high blood pressure -muscle aches or pains -pain, swelling, warmth in the leg -rapid weight gain -severe headaches -sudden numbness or weakness of the face, arm or leg -trouble walking, dizziness, loss of balance or coordination -seizures (convulsions) -swelling of the ankles, feet, hands -unusually weak or tired Side effects that usually do not require medical attention (report to your doctor or health care professional if they continue or are bothersome): -diarrhea -fever, chills (flu-like symptoms) -headaches -nausea, vomiting -redness, stinging, or swelling at site where injected This list may not describe all possible side effects. Call your doctor for medical advice about side effects. You may report side effects to FDA at 1-800-FDA-1088. Where should I keep my medicine? Keep out of the reach of children. Store in a refrigerator between 2 and 8 degrees C (36 and 46 degrees F). Do not freeze or shake. Throw away any unused portion if using a single-dose vial. Multi-dose vials can be kept in the refrigerator for up to 21 days after the initial dose. Throw away unused medicine. NOTE: This sheet is a summary. It may not cover all possible information. If you have questions about this medicine, talk to your doctor, pharmacist, or health care provider.  2015, Elsevier/Gold Standard. (2008-05-18 10:25:44) Ferumoxytol injection What is this medicine? FERUMOXYTOL is an iron complex. Iron is used to make healthy red blood cells, which carry oxygen and nutrients throughout the body. This medicine is used to treat iron deficiency anemia in people with chronic kidney disease. This medicine may be used for other purposes; ask your health care provider or pharmacist if you have questions. COMMON BRAND NAME(S): Feraheme What should I tell my health care provider before I take this medicine? They need to know if you have any of these conditions: -anemia not caused by  low iron levels -high levels of iron in the blood -magnetic resonance imaging (MRI) test scheduled -an unusual or allergic reaction to iron, other medicines, foods, dyes, or preservatives -pregnant or trying to get pregnant -breast-feeding How should I use this medicine? This medicine is for injection into a vein. It is given by a health care professional in a hospital or clinic setting. Talk to your pediatrician regarding the use of this medicine in children. Special care may be needed. Overdosage: If you think you've taken too much of this medicine contact a poison control center or emergency room at once. Overdosage: If you think you have taken too much of this medicine contact a poison control center or emergency room at once. NOTE: This medicine is only for you. Do not share this medicine with others. What if I miss a dose? It is important not to miss your dose. Call your doctor or health care professional if you are unable to keep an appointment. What may interact with this medicine? This medicine may interact with the following medications: -other iron products This list may not describe all possible interactions. Give your health care provider a list of all the medicines, herbs, non-prescription drugs, or dietary supplements you use. Also tell them if you smoke, drink alcohol, or use illegal drugs. Some items may interact with your medicine. What should I  watch for while using this medicine? Visit your doctor or healthcare professional regularly. Tell your doctor or healthcare professional if your symptoms do not start to get better or if they get worse. You may need blood work done while you are taking this medicine. You may need to follow a special diet. Talk to your doctor. Foods that contain iron include: whole grains/cereals, dried fruits, beans, or peas, leafy green vegetables, and organ meats (liver, kidney). What side effects may I notice from receiving this medicine? Side  effects that you should report to your doctor or health care professional as soon as possible: -allergic reactions like skin rash, itching or hives, swelling of the face, lips, or tongue -breathing problems -changes in blood pressure -feeling faint or lightheaded, falls -fever or chills -flushing, sweating, or hot feelings -swelling of the ankles or feet Side effects that usually do not require medical attention (Report these to your doctor or health care professional if they continue or are bothersome.): -diarrhea -headache -nausea, vomiting -stomach pain This list may not describe all possible side effects. Call your doctor for medical advice about side effects. You may report side effects to FDA at 1-800-FDA-1088. Where should I keep my medicine? This drug is given in a hospital or clinic and will not be stored at home. NOTE: This sheet is a summary. It may not cover all possible information. If you have questions about this medicine, talk to your doctor, pharmacist, or health care provider.  2015, Elsevier/Gold Standard. (2012-01-18 15:23:36)

## 2014-04-26 NOTE — Telephone Encounter (Signed)
I am doing fine.  He is getting his first dose of procrit.  Very minimal bleeding.   REV scheduled for 06/02/14

## 2014-04-26 NOTE — Telephone Encounter (Signed)
Left message for patient to call back  

## 2014-04-26 NOTE — Telephone Encounter (Signed)
-----   Message from Gatha Mayer, MD sent at 04/25/2014 10:28 AM EST ----- Regarding: needs REV He needs an REV in [place] if he is not doing well - i saw renal note that he was bleeding so please get a sx update thanks

## 2014-05-03 ENCOUNTER — Encounter (HOSPITAL_COMMUNITY)
Admission: RE | Admit: 2014-05-03 | Discharge: 2014-05-03 | Disposition: A | Discharge status: Home / Self Care | Payer: Managed Care, Other (non HMO) | Source: Ambulatory Visit | Attending: Nephrology | Admitting: Nephrology

## 2014-05-03 DIAGNOSIS — D631 Anemia in chronic kidney disease: Secondary | ICD-10-CM | POA: Diagnosis present

## 2014-05-03 DIAGNOSIS — N183 Chronic kidney disease, stage 3 (moderate): Secondary | ICD-10-CM | POA: Insufficient documentation

## 2014-05-03 LAB — BASIC METABOLIC PANEL
Anion gap: 12 (ref 5–15)
BUN: 28 mg/dL — AB (ref 6–23)
CO2: 24 mEq/L (ref 19–32)
Calcium: 9.5 mg/dL (ref 8.4–10.5)
Chloride: 102 mEq/L (ref 96–112)
Creatinine, Ser: 2.02 mg/dL — ABNORMAL HIGH (ref 0.50–1.35)
GFR, EST AFRICAN AMERICAN: 37 mL/min — AB (ref >90)
GFR, EST NON AFRICAN AMERICAN: 32 mL/min — AB (ref >90)
Glucose, Bld: 96 mg/dL (ref 70–99)
Potassium: 4.6 mEq/L (ref 3.7–5.3)
SODIUM: 138 meq/L (ref 137–147)

## 2014-05-03 LAB — IRON AND TIBC
Iron: 76 ug/dL (ref 42–135)
SATURATION RATIOS: 37 % (ref 20–55)
TIBC: 207 ug/dL — AB (ref 215–435)
UIBC: 131 ug/dL (ref 125–400)

## 2014-05-03 LAB — FERRITIN: Ferritin: 1111 ng/mL — ABNORMAL HIGH (ref 22–322)

## 2014-05-03 LAB — POCT HEMOGLOBIN-HEMACUE: HEMOGLOBIN: 9.1 g/dL — AB (ref 13.0–17.0)

## 2014-05-03 MED ORDER — EPOETIN ALFA 10000 UNIT/ML IJ SOLN
INTRAMUSCULAR | Status: AC
  Filled 2014-05-03: qty 1

## 2014-05-03 MED ORDER — EPOETIN ALFA 10000 UNIT/ML IJ SOLN
10000.0000 [IU] | INTRAMUSCULAR | Status: DC
  Administered 2014-05-03: 10000 [IU] via SUBCUTANEOUS

## 2014-05-05 ENCOUNTER — Telehealth: Payer: Self-pay | Admitting: Internal Medicine

## 2014-05-05 MED ORDER — DIPHENOXYLATE-ATROPINE 2.5-0.025 MG PO TABS: 1.0000 | ORAL_TABLET | Freq: Every day | ORAL | Status: DC

## 2014-05-05 NOTE — Telephone Encounter (Signed)
Left message on his voice mail that Lomotil refilled.  Ok per Barb Merino, RN, [place].  Lomotil rx was faxed to [facility].

## 2014-05-11 ENCOUNTER — Encounter (HOSPITAL_COMMUNITY)
Admission: RE | Admit: 2014-05-11 | Discharge: 2014-05-11 | Disposition: A | Discharge status: Home / Self Care | Payer: Managed Care, Other (non HMO) | Source: Ambulatory Visit | Attending: Nephrology | Admitting: Nephrology

## 2014-05-11 DIAGNOSIS — D631 Anemia in chronic kidney disease: Secondary | ICD-10-CM | POA: Diagnosis not present

## 2014-05-11 LAB — POCT HEMOGLOBIN-HEMACUE: Hemoglobin: 10 g/dL — ABNORMAL LOW (ref 13.0–17.0)

## 2014-05-11 MED ORDER — EPOETIN ALFA 10000 UNIT/ML IJ SOLN
INTRAMUSCULAR | Status: AC
  Filled 2014-05-11: qty 1

## 2014-05-11 MED ORDER — EPOETIN ALFA 10000 UNIT/ML IJ SOLN
10000.0000 [IU] | INTRAMUSCULAR | Status: DC
  Administered 2014-05-11: 10000 [IU] via SUBCUTANEOUS

## 2014-05-18 ENCOUNTER — Encounter (HOSPITAL_COMMUNITY)
Admission: RE | Admit: 2014-05-18 | Discharge: 2014-05-18 | Disposition: A | Discharge status: Home / Self Care | Payer: Managed Care, Other (non HMO) | Source: Ambulatory Visit | Attending: Nephrology | Admitting: Nephrology

## 2014-05-18 DIAGNOSIS — N183 Chronic kidney disease, stage 3 (moderate): Secondary | ICD-10-CM | POA: Diagnosis not present

## 2014-05-18 DIAGNOSIS — D631 Anemia in chronic kidney disease: Secondary | ICD-10-CM | POA: Insufficient documentation

## 2014-05-18 LAB — POCT HEMOGLOBIN-HEMACUE: Hemoglobin: 10 g/dL — ABNORMAL LOW (ref 13.0–17.0)

## 2014-05-18 MED ORDER — EPOETIN ALFA 10000 UNIT/ML IJ SOLN
10000.0000 [IU] | INTRAMUSCULAR | Status: DC
  Administered 2014-05-18: 10000 [IU] via SUBCUTANEOUS

## 2014-05-18 MED ORDER — EPOETIN ALFA 10000 UNIT/ML IJ SOLN
INTRAMUSCULAR | Status: AC
  Administered 2014-05-18: 10000 [IU] via SUBCUTANEOUS
  Filled 2014-05-18: qty 1

## 2014-05-19 ENCOUNTER — Telehealth: Payer: Self-pay | Admitting: Internal Medicine

## 2014-05-19 MED ORDER — DIPHENOXYLATE-ATROPINE 2.5-0.025 MG PO TABS: 1.0000 | ORAL_TABLET | Freq: Every day | ORAL | Status: DC

## 2014-05-19 NOTE — Telephone Encounter (Signed)
Left detailed voice mail for patient that Lomotil rx sent in to [facility] as requested.  Faxed to them and confirmation that it went thru obtained.

## 2014-05-20 ENCOUNTER — Telehealth: Payer: Self-pay | Admitting: Internal Medicine

## 2014-05-20 DIAGNOSIS — R197 Diarrhea, unspecified: Secondary | ICD-10-CM

## 2014-05-20 MED ORDER — PREDNISONE 10 MG PO TABS: 40.0000 mg | ORAL_TABLET | Freq: Every day | ORAL | Status: DC

## 2014-05-20 MED ORDER — DIPHENOXYLATE-ATROPINE 2.5-0.025 MG PO TABS: ORAL_TABLET | ORAL | Status: DC

## 2014-05-20 NOTE — Telephone Encounter (Signed)
Patient notified rx sent

## 2014-05-20 NOTE — Telephone Encounter (Signed)
1) C diff PCR, BMET, CBC 2) Lomotil 1-2 every 4 hrs prn # 120 1 RF 3) prednisone 10 mg tabs take 4 each day # 100 tabs - this will treat a UC flare if that is what it is, if its C diff will stop it but I want to cover bases for now

## 2014-05-20 NOTE — Telephone Encounter (Signed)
Patient reports 10-12 days of diarrhea 3-4 episodes a day worse in the pm.  Patient states that he is not able to eat during the day due to the diarrhea. He is trying to work and not able to get out of the bathroom.   He has been taking lomotil two times a day and is now out.  A refill was sent to the pharmacy yesterday, but they won't fill it because the directions say one a day.  Dr. Carlean Purl please advise

## 2014-05-21 ENCOUNTER — Other Ambulatory Visit (INDEPENDENT_AMBULATORY_CARE_PROVIDER_SITE_OTHER): Payer: Managed Care, Other (non HMO)

## 2014-05-21 DIAGNOSIS — R197 Diarrhea, unspecified: Secondary | ICD-10-CM

## 2014-05-22 LAB — CBC WITH DIFFERENTIAL/PLATELET
Basophils Absolute: 0 10*3/uL (ref 0.0–0.1)
Basophils Relative: 0 % (ref 0.0–3.0)
EOS ABS: 0 10*3/uL (ref 0.0–0.7)
Eosinophils Relative: 0.1 % (ref 0.0–5.0)
HCT: 34.9 % — ABNORMAL LOW (ref 39.0–52.0)
HEMOGLOBIN: 10.9 g/dL — AB (ref 13.0–17.0)
LYMPHS PCT: 7.5 % — AB (ref 12.0–46.0)
Lymphs Abs: 0.8 10*3/uL (ref 0.7–4.0)
MCHC: 31.3 g/dL (ref 30.0–36.0)
MCV: 103 fl — ABNORMAL HIGH (ref 78.0–100.0)
Monocytes Absolute: 0 10*3/uL — ABNORMAL LOW (ref 0.1–1.0)
Monocytes Relative: 0.3 % — ABNORMAL LOW (ref 3.0–12.0)
NEUTROS ABS: 7.9 10*3/uL — AB (ref 1.4–7.7)
Neutrophils Relative %: 90.5 % — ABNORMAL HIGH (ref 43.0–77.0)
Platelets: 488 10*3/uL — ABNORMAL HIGH (ref 150.0–400.0)
RBC: 3.39 Mil/uL — AB (ref 4.22–5.81)
RDW: 22.7 % — ABNORMAL HIGH (ref 11.5–15.5)
WBC: 8.7 10*3/uL (ref 4.0–10.5)

## 2014-05-23 LAB — BASIC METABOLIC PANEL
BUN: 28 mg/dL — AB (ref 6–23)
CHLORIDE: 102 meq/L (ref 96–112)
CO2: 25 meq/L (ref 19–32)
CREATININE: 2.1 mg/dL — AB (ref 0.4–1.5)
Calcium: 9.1 mg/dL (ref 8.4–10.5)
GFR: 34.22 mL/min — ABNORMAL LOW (ref >60.00)
Glucose, Bld: 119 mg/dL — ABNORMAL HIGH (ref 70–99)
Potassium: 4.4 mEq/L (ref 3.5–5.1)
Sodium: 136 mEq/L (ref 135–145)

## 2014-05-24 ENCOUNTER — Encounter (HOSPITAL_COMMUNITY): Payer: Managed Care, Other (non HMO)

## 2014-05-24 NOTE — Progress Notes (Signed)
Quick Note:  Nothing alarming on labs Please get a symptom update ______

## 2014-05-25 ENCOUNTER — Encounter (HOSPITAL_COMMUNITY)
Admission: RE | Admit: 2014-05-25 | Discharge: 2014-05-25 | Disposition: A | Discharge status: Home / Self Care | Payer: Managed Care, Other (non HMO) | Source: Ambulatory Visit | Attending: Nephrology | Admitting: Nephrology

## 2014-05-25 ENCOUNTER — Other Ambulatory Visit: Payer: Medicare Other

## 2014-05-25 DIAGNOSIS — N183 Chronic kidney disease, stage 3 (moderate): Secondary | ICD-10-CM | POA: Diagnosis not present

## 2014-05-25 DIAGNOSIS — D631 Anemia in chronic kidney disease: Secondary | ICD-10-CM | POA: Diagnosis not present

## 2014-05-25 DIAGNOSIS — R197 Diarrhea, unspecified: Secondary | ICD-10-CM

## 2014-05-25 LAB — POCT HEMOGLOBIN-HEMACUE: HEMOGLOBIN: 11.2 g/dL — AB (ref 13.0–17.0)

## 2014-05-25 MED ORDER — EPOETIN ALFA 10000 UNIT/ML IJ SOLN
INTRAMUSCULAR | Status: AC
  Administered 2014-05-25: 10000 [IU] via SUBCUTANEOUS
  Filled 2014-05-25: qty 1

## 2014-05-25 MED ORDER — EPOETIN ALFA 10000 UNIT/ML IJ SOLN
10000.0000 [IU] | INTRAMUSCULAR | Status: DC
  Administered 2014-05-25: 10000 [IU] via SUBCUTANEOUS

## 2014-05-26 LAB — CLOSTRIDIUM DIFFICILE BY PCR: Toxigenic C. Difficile by PCR: DETECTED — CR

## 2014-05-27 MED ORDER — VANCOMYCIN 50 MG/ML ORAL SOLUTION: 125.0000 mg | Freq: Four times a day (QID) | ORAL | Status: DC

## 2014-05-27 NOTE — Progress Notes (Signed)
Quick Note:  Has c diff  Stop prednisone  Start vanco 125 mg qid x 14 days  May need [facility]??? ______

## 2014-06-01 ENCOUNTER — Encounter (HOSPITAL_COMMUNITY)
Admission: RE | Admit: 2014-06-01 | Discharge: 2014-06-01 | Disposition: A | Discharge status: Home / Self Care | Payer: Managed Care, Other (non HMO) | Source: Ambulatory Visit | Attending: Nephrology | Admitting: Nephrology

## 2014-06-01 DIAGNOSIS — N183 Chronic kidney disease, stage 3 (moderate): Secondary | ICD-10-CM | POA: Insufficient documentation

## 2014-06-01 DIAGNOSIS — D631 Anemia in chronic kidney disease: Secondary | ICD-10-CM | POA: Diagnosis present

## 2014-06-01 LAB — BASIC METABOLIC PANEL
Anion gap: 12 (ref 5–15)
BUN: 31 mg/dL — AB (ref 6–23)
CHLORIDE: 102 meq/L (ref 96–112)
CO2: 24 mEq/L (ref 19–32)
Calcium: 8.9 mg/dL (ref 8.4–10.5)
Creatinine, Ser: 1.66 mg/dL — ABNORMAL HIGH (ref 0.50–1.35)
GFR calc Af Amer: 47 mL/min — ABNORMAL LOW (ref >90)
GFR, EST NON AFRICAN AMERICAN: 41 mL/min — AB (ref >90)
GLUCOSE: 102 mg/dL — AB (ref 70–99)
Potassium: 4.3 mEq/L (ref 3.7–5.3)
Sodium: 138 mEq/L (ref 137–147)

## 2014-06-01 LAB — FERRITIN: Ferritin: 377 ng/mL — ABNORMAL HIGH (ref 22–322)

## 2014-06-01 LAB — POCT HEMOGLOBIN-HEMACUE: Hemoglobin: 10.8 g/dL — ABNORMAL LOW (ref 13.0–17.0)

## 2014-06-01 LAB — IRON AND TIBC
Iron: 24 ug/dL — ABNORMAL LOW (ref 42–135)
Saturation Ratios: 15 % — ABNORMAL LOW (ref 20–55)
TIBC: 163 ug/dL — ABNORMAL LOW (ref 215–435)
UIBC: 139 ug/dL (ref 125–400)

## 2014-06-01 MED ORDER — EPOETIN ALFA 10000 UNIT/ML IJ SOLN
INTRAMUSCULAR | Status: AC
  Filled 2014-06-01: qty 1

## 2014-06-01 MED ORDER — EPOETIN ALFA 10000 UNIT/ML IJ SOLN
10000.0000 [IU] | INTRAMUSCULAR | Status: DC
  Administered 2014-06-01: 10000 [IU] via SUBCUTANEOUS

## 2014-06-02 ENCOUNTER — Other Ambulatory Visit (INDEPENDENT_AMBULATORY_CARE_PROVIDER_SITE_OTHER): Payer: Managed Care, Other (non HMO)

## 2014-06-02 ENCOUNTER — Encounter: Payer: Self-pay | Admitting: Internal Medicine

## 2014-06-02 ENCOUNTER — Ambulatory Visit (INDEPENDENT_AMBULATORY_CARE_PROVIDER_SITE_OTHER): Payer: Managed Care, Other (non HMO) | Admitting: Internal Medicine

## 2014-06-02 VITALS — BP 138/72 | HR 80 | Ht 70.0 in | Wt 147.6 lb

## 2014-06-02 DIAGNOSIS — K51018 Ulcerative (chronic) pancolitis with other complication: Secondary | ICD-10-CM

## 2014-06-02 DIAGNOSIS — Z7960 Long term (current) use of unspecified immunomodulators and immunosuppressants: Secondary | ICD-10-CM

## 2014-06-02 DIAGNOSIS — N189 Chronic kidney disease, unspecified: Secondary | ICD-10-CM

## 2014-06-02 DIAGNOSIS — D631 Anemia in chronic kidney disease: Secondary | ICD-10-CM

## 2014-06-02 DIAGNOSIS — Z79899 Other long term (current) drug therapy: Secondary | ICD-10-CM

## 2014-06-02 DIAGNOSIS — A0472 Enterocolitis due to Clostridium difficile, not specified as recurrent: Secondary | ICD-10-CM | POA: Insufficient documentation

## 2014-06-02 DIAGNOSIS — Z92241 Personal history of systemic steroid therapy: Secondary | ICD-10-CM

## 2014-06-02 DIAGNOSIS — A047 Enterocolitis due to Clostridium difficile: Secondary | ICD-10-CM

## 2014-06-02 DIAGNOSIS — K602 Anal fissure, unspecified: Secondary | ICD-10-CM

## 2014-06-02 HISTORY — DX: Enterocolitis due to Clostridium difficile, not specified as recurrent: A04.72

## 2014-06-02 LAB — VITAMIN D 25 HYDROXY (VIT D DEFICIENCY, FRACTURES): VITD: 22.42 ng/mL — ABNORMAL LOW (ref 30.00–100.00)

## 2014-06-02 LAB — HEPATIC FUNCTION PANEL
ALT: 21 U/L (ref 0–53)
AST: 21 U/L (ref 0–37)
Albumin: 3.4 g/dL — ABNORMAL LOW (ref 3.5–5.2)
Alkaline Phosphatase: 66 U/L (ref 39–117)
BILIRUBIN DIRECT: 0.1 mg/dL (ref 0.0–0.3)
Total Bilirubin: 0.7 mg/dL (ref 0.2–1.2)
Total Protein: 6.6 g/dL (ref 6.0–8.3)

## 2014-06-02 MED ORDER — SACCHAROMYCES BOULARDII 250 MG PO CAPS: 250.0000 mg | ORAL_CAPSULE | Freq: Two times a day (BID) | ORAL | Status: AC

## 2014-06-02 NOTE — Assessment & Plan Note (Addendum)
Is responding to his first course of treatment of vancomycin. Seems like he has resolved with one day of vancomycin left to take. We discussed how this can recur, we'll start Florastor250 mg twice a day for 2 months.

## 2014-06-02 NOTE — Progress Notes (Signed)
Subjective:    Patient ID: Sean Pope, male    DOB: [birth year], 68 y.o.   MRN: [id number]  HPI  Returns for routine follow-up today. This was scheduled months ago. In the interim he improved, and was getting more towards normal but in the last couple of weeks started have diarrhea again which turned out to be C. difficile positive colitis. I initially started empiric prednisone thinking was flaring, when C. difficile PCR came back positive last week I started vancomycin 125 mg 4 times a day and he noted a rapid improvement. He is off the prednisone without difficulty. He feels like his bowels are almost back to normal. He is still using about one Lomotil a day when he works. He has started getting erythropoietin therapy through Dr. Posey Pronto. He does not have other complaints today though he notes he has not gained much weight back at least not too was normal situation, he has to wear suspenders as his pants or loose and will fall off unless he uses a suspenders and a belt this doesn't work.  No Known Allergies Outpatient Prescriptions Prior to Visit  Medication Sig Dispense Refill  . Adalimumab (HUMIRA PEN) 40 MG/0.8ML PNKT Inject 40 mg into the skin every 14 (fourteen) days. 2 each   . diphenoxylate-atropine (LOMOTIL) 2.5-0.025 MG per tablet Take 1-2 po q 4 hours prn diarrhea 120 tablet 1  . Epoetin Alfa (PROCRIT IJ) As directed starting on 04/27/14    . Ferrous Sulfate (IRON) 325 (65 FE) MG TABS Take 1 tablet by mouth 2 (two) times daily.     . furosemide (LASIX) 40 MG tablet Take 2 tablets (80 mg total) by mouth daily. (Patient taking differently: Take 40 mg by mouth daily. ) 30 tablet 0  . mercaptopurine (PURINETHOL) 50 MG tablet Take 1.5 tablets (75 mg total) by mouth daily. Give on an empty stomach 1 hour before or 2 hours after meals. Caution: Chemotherapy. 45 tablet 2  . Multiple Vitamins-Minerals (MENS MULTIVITAMIN PLUS PO) Take 1 tablet by mouth.    . Potassium Chloride ER 20 MEQ TBCR  Take 20 mEq by mouth daily. 30 tablet 2  . simvastatin (ZOCOR) 40 MG tablet Take 40 mg by mouth every evening.    . vancomycin (VANCOCIN) 50 mg/mL oral solution Take 2.5 mLs (125 mg total) by mouth 4 (four) times daily. 140 mL 0  . Aclidinium Bromide (TUDORZA PRESSAIR) 400 MCG/ACT AEPB Inhale 1 puff into the lungs 2 (two) times daily. One twice daily 1 each 11  . albuterol (PROVENTIL HFA;VENTOLIN HFA) 108 (90 BASE) MCG/ACT inhaler Inhale 2 puffs into the lungs every 6 (six) hours as needed for wheezing or shortness of breath. 1 Inhaler 2  . famotidine (PEPCID) 20 MG tablet Take 1 tablet by mouth daily.    . predniSONE (DELTASONE) 10 MG tablet Take 4 tablets (40 mg total) by mouth daily with breakfast. 100 tablet 0   No facility-administered medications prior to visit.   Past Medical History  Diagnosis Date  . Stroke   . SAH (subarachnoid hemorrhage)   . Hypertension   . Hyperlipidemia   . Lung collapse 09/26/2011  . Acute respiratory failure with hypoxia 09/17/2011  . 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 Ulcerative colitis 05/28/2013  . AKI (acute kidney injury)     In the setting of ulcerative colitis flare, question relationship to mesalamine  . Long-term use of immunosuppressant medication-mercaptopurine and Humira 01/05/2014  . Anemia-multifactorial 01/05/2014    Blood loss,  medications, chronic illness kidney injury  . Anal fissure - posterior 01/22/2014  . Respiratory failure, acute 01/07/2014  . Chronic kidney disease, stage III (moderate) 09/08/2013  . Protein-calorie malnutrition, severe 10/26/2013   Past Surgical History  Procedure Laterality Date  . Aneurysm coiling  2013    Posterior communicating  . Colonoscopy w/ biopsies  05/28/2013   Review of Systems As above    Objective:   Physical Exam General:  NAD Eyes:   anicteric Lungs:  clear Heart:  S1S2 no rubs, murmurs or gallops Abdomen:  soft and nontender, BS+ Ext:   no edema    Data Reviewed:    Lab Results  Component Value Date   WBC 8.7 05/21/2014   HGB 10.8* 06/01/2014   HCT 34.9* 05/21/2014   MCV 103.0* 05/21/2014   PLT 488.0* 05/21/2014   Lab Results  Component Value Date   CREATININE 1.66* 06/01/2014   BUN 31* 06/01/2014   NA 138 06/01/2014   K 4.3 06/01/2014   CL 102 06/01/2014   CO2 24 06/01/2014       Assessment & Plan:  Ulcerative chronic pancolitis Seems to be achieving remission on 6-MP and Humira Colonoscopy in 2016 to reassess, sooner if needed. We talked about clinical versus endoscopic remission today.  Anemia-multifactorial He is being treated with erythropoietin now. Hemoglobin was 10.8 the other day. His ferritin is normal.  Long-term use of immunosuppressant medication-mercaptopurine and Humira White count is okay, he needs a LFT checked today. He has had flue shot. His immunizations are up-to-date.  Anal fissure - posterior He did not complain about this today  C. difficile colitis Is responding to his first course of treatment of vancomycin. Seems like he has resolved with one day of vancomycin left to take. We discussed how this can recur, we'll start Florastor250 mg twice a day for 2 months.  History of systemic steroid therapy He needs a bone density test and a vitamin D level today.   Meds ordered this encounter  Medications  . saccharomyces boulardii (FLORASTOR) 250 MG capsule    Sig: Take 1 capsule (250 mg total) by mouth 2 (two) times daily.   CC: MILLER, Serina Cowper, MD and Elmarie Shiley M.D.

## 2014-06-02 NOTE — Patient Instructions (Addendum)
Your physician has requested that you go to the basement for the following lab work before leaving today: LFT's, Vit. D  Please take Florastor twice a day for 2 months to put the good bacteria back into your colon.  Coupon provided.   Follow up with [place] in 4 months, I will call him closer to the time and set up the appointment.  You have been set up for a dexa scan in our basement x-ray dept for 06/23/2014 at 10:00am.    I appreciate the opportunity to care for you.

## 2014-06-02 NOTE — Assessment & Plan Note (Signed)
He did not complain about this today

## 2014-06-02 NOTE — Assessment & Plan Note (Signed)
He is being treated with erythropoietin now. Hemoglobin was 10.8 the other day. His ferritin is normal.

## 2014-06-02 NOTE — Assessment & Plan Note (Addendum)
Seems to be achieving remission on 6-MP and Humira Colonoscopy in 2016 to reassess, sooner if needed. We talked about clinical versus endoscopic remission today.

## 2014-06-02 NOTE — Assessment & Plan Note (Signed)
White count is okay, he needs a LFT checked today. He has had flue shot. His immunizations are up-to-date.

## 2014-06-02 NOTE — Assessment & Plan Note (Signed)
He needs a bone density test and a vitamin D level today.

## 2014-06-03 ENCOUNTER — Other Ambulatory Visit: Payer: Self-pay

## 2014-06-03 ENCOUNTER — Encounter: Payer: Self-pay | Admitting: Internal Medicine

## 2014-06-03 DIAGNOSIS — E559 Vitamin D deficiency, unspecified: Secondary | ICD-10-CM

## 2014-06-03 DIAGNOSIS — Z7960 Long term (current) use of unspecified immunomodulators and immunosuppressants: Secondary | ICD-10-CM

## 2014-06-03 DIAGNOSIS — Z79899 Other long term (current) drug therapy: Secondary | ICD-10-CM

## 2014-06-03 HISTORY — DX: Vitamin D deficiency, unspecified: E55.9

## 2014-06-03 MED ORDER — ERGOCALCIFEROL 1.25 MG (50000 UT) PO CAPS: 50000.0000 [IU] | ORAL_CAPSULE | ORAL | Status: DC

## 2014-06-03 NOTE — Progress Notes (Signed)
Quick Note:  1) Vit D is low - 50K U weekly x 8 # 8 no refills 2) LFT's ok 3) CBC and LFT 3 months ______

## 2014-06-04 ENCOUNTER — Telehealth: Payer: Self-pay | Admitting: Internal Medicine

## 2014-06-04 NOTE — Telephone Encounter (Signed)
I spoke with Sean Pope at Dr. Serita Grit office and they are ordering [facility] for him. He is advised that he does need to go ahead and start on the oral Vitamin D.  I left him a detailed message and asked that he call back for any questions or concerns

## 2014-06-07 ENCOUNTER — Encounter (HOSPITAL_COMMUNITY): Payer: Managed Care, Other (non HMO)

## 2014-06-07 ENCOUNTER — Encounter (HOSPITAL_COMMUNITY)
Admission: RE | Admit: 2014-06-07 | Discharge: 2014-06-07 | Disposition: A | Discharge status: Home / Self Care | Payer: Managed Care, Other (non HMO) | Source: Ambulatory Visit | Attending: Nephrology | Admitting: Nephrology

## 2014-06-07 DIAGNOSIS — D631 Anemia in chronic kidney disease: Secondary | ICD-10-CM | POA: Diagnosis not present

## 2014-06-07 MED ORDER — EPOETIN ALFA 10000 UNIT/ML IJ SOLN
10000.0000 [IU] | INTRAMUSCULAR | Status: DC
  Administered 2014-06-07: 10000 [IU] via SUBCUTANEOUS

## 2014-06-07 MED ORDER — EPOETIN ALFA 10000 UNIT/ML IJ SOLN
INTRAMUSCULAR | Status: AC
  Filled 2014-06-07: qty 1

## 2014-06-08 ENCOUNTER — Encounter (HOSPITAL_COMMUNITY): Payer: Managed Care, Other (non HMO)

## 2014-06-08 LAB — POCT HEMOGLOBIN-HEMACUE: Hemoglobin: 10.6 g/dL — ABNORMAL LOW (ref 13.0–17.0)

## 2014-06-13 ENCOUNTER — Other Ambulatory Visit: Payer: Self-pay | Admitting: Internal Medicine

## 2014-06-14 ENCOUNTER — Other Ambulatory Visit (HOSPITAL_COMMUNITY): Payer: Self-pay | Admitting: *Deleted

## 2014-06-14 NOTE — Telephone Encounter (Signed)
Ok to refill Sir?

## 2014-06-14 NOTE — Telephone Encounter (Signed)
OK to refill x 3

## 2014-06-15 ENCOUNTER — Encounter (HOSPITAL_COMMUNITY)
Admission: RE | Admit: 2014-06-15 | Discharge: 2014-06-15 | Disposition: A | Discharge status: Home / Self Care | Payer: Managed Care, Other (non HMO) | Source: Ambulatory Visit | Attending: Nephrology | Admitting: Nephrology

## 2014-06-15 ENCOUNTER — Encounter (HOSPITAL_COMMUNITY): Payer: Managed Care, Other (non HMO)

## 2014-06-15 DIAGNOSIS — D631 Anemia in chronic kidney disease: Secondary | ICD-10-CM | POA: Diagnosis not present

## 2014-06-15 LAB — POCT HEMOGLOBIN-HEMACUE: Hemoglobin: 11.6 g/dL — ABNORMAL LOW (ref 13.0–17.0)

## 2014-06-15 MED ORDER — FERUMOXYTOL INJECTION 510 MG/17 ML
510.0000 mg | INTRAVENOUS | Status: DC
  Administered 2014-06-15: 510 mg via INTRAVENOUS
  Filled 2014-06-15: qty 17

## 2014-06-15 MED ORDER — EPOETIN ALFA 10000 UNIT/ML IJ SOLN
10000.0000 [IU] | INTRAMUSCULAR | Status: DC
  Administered 2014-06-15: 10000 [IU] via SUBCUTANEOUS

## 2014-06-15 MED ORDER — EPOETIN ALFA 10000 UNIT/ML IJ SOLN
INTRAMUSCULAR | Status: AC
  Administered 2014-06-15: 10000 [IU] via SUBCUTANEOUS
  Filled 2014-06-15: qty 1

## 2014-06-22 ENCOUNTER — Encounter (HOSPITAL_COMMUNITY)
Admission: RE | Admit: 2014-06-22 | Discharge: 2014-06-22 | Disposition: A | Discharge status: Home / Self Care | Payer: Managed Care, Other (non HMO) | Source: Ambulatory Visit | Attending: Nephrology | Admitting: Nephrology

## 2014-06-22 DIAGNOSIS — N183 Chronic kidney disease, stage 3 (moderate): Secondary | ICD-10-CM | POA: Insufficient documentation

## 2014-06-22 DIAGNOSIS — D631 Anemia in chronic kidney disease: Secondary | ICD-10-CM | POA: Diagnosis present

## 2014-06-22 LAB — POCT HEMOGLOBIN-HEMACUE: Hemoglobin: 12.2 g/dL — ABNORMAL LOW (ref 13.0–17.0)

## 2014-06-22 MED ORDER — EPOETIN ALFA 10000 UNIT/ML IJ SOLN: 10000.0000 [IU] | INTRAMUSCULAR | Status: DC

## 2014-06-22 MED ORDER — SODIUM CHLORIDE 0.9 % IV SOLN
510.0000 mg | INTRAVENOUS | Status: DC
  Administered 2014-06-22: 510 mg via INTRAVENOUS
  Filled 2014-06-22: qty 17

## 2014-06-23 ENCOUNTER — Ambulatory Visit (INDEPENDENT_AMBULATORY_CARE_PROVIDER_SITE_OTHER)
Admission: RE | Admit: 2014-06-23 | Discharge: 2014-06-23 | Disposition: A | Discharge status: Home / Self Care | Payer: Managed Care, Other (non HMO) | Source: Ambulatory Visit | Attending: Internal Medicine | Admitting: Internal Medicine

## 2014-06-23 DIAGNOSIS — Z92241 Personal history of systemic steroid therapy: Secondary | ICD-10-CM

## 2014-06-29 ENCOUNTER — Encounter: Payer: Self-pay | Admitting: Internal Medicine

## 2014-06-29 DIAGNOSIS — M810 Age-related osteoporosis without current pathological fracture: Secondary | ICD-10-CM

## 2014-06-29 HISTORY — DX: Age-related osteoporosis without current pathological fracture: M81.0

## 2014-06-29 NOTE — Progress Notes (Signed)
Quick Note:  He has osteoporosis and needs Tx Usually done through PCP I need him to get an appt with Dr. Kathyrn Lass and we need to cc DEXA results to her (need to print out the "images" which are scanned under imaging ______

## 2014-07-06 ENCOUNTER — Other Ambulatory Visit: Payer: Self-pay | Admitting: Internal Medicine

## 2014-07-07 ENCOUNTER — Encounter (HOSPITAL_COMMUNITY)
Admission: RE | Admit: 2014-07-07 | Discharge: 2014-07-07 | Disposition: A | Discharge status: Home / Self Care | Payer: Managed Care, Other (non HMO) | Source: Ambulatory Visit | Attending: Nephrology | Admitting: Nephrology

## 2014-07-07 DIAGNOSIS — D631 Anemia in chronic kidney disease: Secondary | ICD-10-CM | POA: Diagnosis not present

## 2014-07-07 LAB — IRON AND TIBC
Iron: 79 ug/dL (ref 42–165)
Saturation Ratios: 52 % (ref 20–55)
TIBC: 153 ug/dL — ABNORMAL LOW (ref 215–435)
UIBC: 74 ug/dL — ABNORMAL LOW (ref 125–400)

## 2014-07-07 LAB — BASIC METABOLIC PANEL
Anion gap: 10 (ref 5–15)
BUN: 35 mg/dL — AB (ref 6–23)
CHLORIDE: 103 meq/L (ref 96–112)
CO2: 27 mmol/L (ref 19–32)
Calcium: 9.1 mg/dL (ref 8.4–10.5)
Creatinine, Ser: 1.89 mg/dL — ABNORMAL HIGH (ref 0.50–1.35)
GFR calc Af Amer: 40 mL/min — ABNORMAL LOW (ref >90)
GFR, EST NON AFRICAN AMERICAN: 35 mL/min — AB (ref >90)
GLUCOSE: 106 mg/dL — AB (ref 70–99)
POTASSIUM: 4.9 mmol/L (ref 3.5–5.1)
Sodium: 140 mmol/L (ref 135–145)

## 2014-07-07 LAB — FERRITIN: Ferritin: 1180 ng/mL — ABNORMAL HIGH (ref 22–322)

## 2014-07-07 MED ORDER — EPOETIN ALFA 10000 UNIT/ML IJ SOLN
INTRAMUSCULAR | Status: AC
  Filled 2014-07-07: qty 1

## 2014-07-07 MED ORDER — EPOETIN ALFA 10000 UNIT/ML IJ SOLN
10000.0000 [IU] | INTRAMUSCULAR | Status: DC
  Administered 2014-07-07: 10000 [IU] via SUBCUTANEOUS

## 2014-07-08 LAB — POCT HEMOGLOBIN-HEMACUE: Hemoglobin: 11.5 g/dL — ABNORMAL LOW (ref 13.0–17.0)

## 2014-07-12 ENCOUNTER — Encounter (HOSPITAL_COMMUNITY)
Admission: RE | Admit: 2014-07-12 | Discharge: 2014-07-12 | Disposition: A | Discharge status: Home / Self Care | Payer: Managed Care, Other (non HMO) | Source: Ambulatory Visit | Attending: Nephrology | Admitting: Nephrology

## 2014-07-12 DIAGNOSIS — D631 Anemia in chronic kidney disease: Secondary | ICD-10-CM | POA: Diagnosis not present

## 2014-07-12 LAB — POCT HEMOGLOBIN-HEMACUE: Hemoglobin: 12.1 g/dL — ABNORMAL LOW (ref 13.0–17.0)

## 2014-07-12 MED ORDER — EPOETIN ALFA 10000 UNIT/ML IJ SOLN: 10000.0000 [IU] | INTRAMUSCULAR | Status: DC

## 2014-07-21 ENCOUNTER — Other Ambulatory Visit: Payer: Self-pay | Admitting: Internal Medicine

## 2014-07-26 ENCOUNTER — Other Ambulatory Visit (HOSPITAL_COMMUNITY): Payer: Self-pay | Admitting: *Deleted

## 2014-07-27 ENCOUNTER — Encounter (HOSPITAL_COMMUNITY)
Admission: RE | Admit: 2014-07-27 | Discharge: 2014-07-27 | Disposition: A | Discharge status: Home / Self Care | Payer: Managed Care, Other (non HMO) | Source: Ambulatory Visit | Attending: Nephrology | Admitting: Nephrology

## 2014-07-27 DIAGNOSIS — N183 Chronic kidney disease, stage 3 (moderate): Secondary | ICD-10-CM | POA: Insufficient documentation

## 2014-07-27 DIAGNOSIS — D631 Anemia in chronic kidney disease: Secondary | ICD-10-CM | POA: Diagnosis present

## 2014-07-27 MED ORDER — EPOETIN ALFA 10000 UNIT/ML IJ SOLN
10000.0000 [IU] | INTRAMUSCULAR | Status: DC
  Administered 2014-07-27: 10000 [IU] via SUBCUTANEOUS

## 2014-07-27 MED ORDER — EPOETIN ALFA 10000 UNIT/ML IJ SOLN
INTRAMUSCULAR | Status: AC
  Filled 2014-07-27: qty 1

## 2014-07-28 ENCOUNTER — Telehealth: Payer: Self-pay

## 2014-07-28 NOTE — Telephone Encounter (Signed)
Spoke with Mr Venuto wife, she said he was out of town today.  I left our number for him to call back and set up his April f/u appointment for [facility].

## 2014-07-28 NOTE — Telephone Encounter (Signed)
-----   Message from Kaegan Hettich E [place], [place] sent at 06/02/2014 10:23 AM EST ----- Call and set him up an appt. For April 2016 for f/u UC.

## 2014-08-03 ENCOUNTER — Encounter (HOSPITAL_COMMUNITY)
Admission: RE | Admit: 2014-08-03 | Discharge: 2014-08-03 | Disposition: A | Discharge status: Home / Self Care | Payer: Managed Care, Other (non HMO) | Source: Ambulatory Visit | Attending: Nephrology | Admitting: Nephrology

## 2014-08-03 DIAGNOSIS — D631 Anemia in chronic kidney disease: Secondary | ICD-10-CM | POA: Diagnosis not present

## 2014-08-03 LAB — BASIC METABOLIC PANEL
ANION GAP: 5 (ref 5–15)
BUN: 32 mg/dL — ABNORMAL HIGH (ref 6–23)
CALCIUM: 8.9 mg/dL (ref 8.4–10.5)
CHLORIDE: 106 mmol/L (ref 96–112)
CO2: 28 mmol/L (ref 19–32)
Creatinine, Ser: 1.9 mg/dL — ABNORMAL HIGH (ref 0.50–1.35)
GFR calc Af Amer: 40 mL/min — ABNORMAL LOW (ref >90)
GFR calc non Af Amer: 34 mL/min — ABNORMAL LOW (ref >90)
Glucose, Bld: 93 mg/dL (ref 70–99)
POTASSIUM: 4.5 mmol/L (ref 3.5–5.1)
SODIUM: 139 mmol/L (ref 135–145)

## 2014-08-03 LAB — IRON AND TIBC
Iron: 38 ug/dL — ABNORMAL LOW (ref 42–165)
Saturation Ratios: 24 % (ref 20–55)
TIBC: 160 ug/dL — ABNORMAL LOW (ref 215–435)
UIBC: 122 ug/dL — AB (ref 125–400)

## 2014-08-03 LAB — FERRITIN: Ferritin: 735 ng/mL — ABNORMAL HIGH (ref 22–322)

## 2014-08-03 LAB — POCT HEMOGLOBIN-HEMACUE: HEMOGLOBIN: 11.5 g/dL — AB (ref 13.0–17.0)

## 2014-08-03 MED ORDER — EPOETIN ALFA 10000 UNIT/ML IJ SOLN
10000.0000 [IU] | INTRAMUSCULAR | Status: DC
  Administered 2014-08-03: 10000 [IU] via SUBCUTANEOUS

## 2014-08-03 MED ORDER — EPOETIN ALFA 10000 UNIT/ML IJ SOLN
INTRAMUSCULAR | Status: AC
  Filled 2014-08-03: qty 1

## 2014-08-10 ENCOUNTER — Encounter (HOSPITAL_COMMUNITY)
Admission: RE | Admit: 2014-08-10 | Discharge: 2014-08-10 | Disposition: A | Discharge status: Home / Self Care | Payer: Managed Care, Other (non HMO) | Source: Ambulatory Visit | Attending: Nephrology | Admitting: Nephrology

## 2014-08-10 DIAGNOSIS — D631 Anemia in chronic kidney disease: Secondary | ICD-10-CM | POA: Diagnosis not present

## 2014-08-10 LAB — POCT HEMOGLOBIN-HEMACUE: Hemoglobin: 11.5 g/dL — ABNORMAL LOW (ref 13.0–17.0)

## 2014-08-10 MED ORDER — EPOETIN ALFA 10000 UNIT/ML IJ SOLN
10000.0000 [IU] | INTRAMUSCULAR | Status: DC
  Administered 2014-08-10: 10000 [IU] via SUBCUTANEOUS

## 2014-08-10 MED ORDER — EPOETIN ALFA 10000 UNIT/ML IJ SOLN
INTRAMUSCULAR | Status: AC
  Filled 2014-08-10: qty 1

## 2014-08-17 ENCOUNTER — Encounter (HOSPITAL_COMMUNITY)
Admission: RE | Admit: 2014-08-17 | Discharge: 2014-08-17 | Disposition: A | Discharge status: Home / Self Care | Payer: Managed Care, Other (non HMO) | Source: Ambulatory Visit | Attending: Nephrology | Admitting: Nephrology

## 2014-08-17 DIAGNOSIS — N183 Chronic kidney disease, stage 3 (moderate): Secondary | ICD-10-CM | POA: Diagnosis not present

## 2014-08-17 DIAGNOSIS — D631 Anemia in chronic kidney disease: Secondary | ICD-10-CM | POA: Diagnosis not present

## 2014-08-17 LAB — POCT HEMOGLOBIN-HEMACUE: Hemoglobin: 12.1 g/dL — ABNORMAL LOW (ref 13.0–17.0)

## 2014-08-17 MED ORDER — EPOETIN ALFA 10000 UNIT/ML IJ SOLN: 10000.0000 [IU] | INTRAMUSCULAR | Status: DC

## 2014-08-31 ENCOUNTER — Encounter (HOSPITAL_COMMUNITY)
Admission: RE | Admit: 2014-08-31 | Discharge: 2014-08-31 | Disposition: A | Discharge status: Home / Self Care | Payer: Managed Care, Other (non HMO) | Source: Ambulatory Visit | Attending: Nephrology | Admitting: Nephrology

## 2014-08-31 DIAGNOSIS — D631 Anemia in chronic kidney disease: Secondary | ICD-10-CM | POA: Diagnosis not present

## 2014-08-31 LAB — IRON AND TIBC
Iron: 72 ug/dL (ref 42–165)
Saturation Ratios: 45 % (ref 20–55)
TIBC: 160 ug/dL — ABNORMAL LOW (ref 215–435)
UIBC: 88 ug/dL — ABNORMAL LOW (ref 125–400)

## 2014-08-31 LAB — BASIC METABOLIC PANEL
ANION GAP: 8 (ref 5–15)
BUN: 24 mg/dL — AB (ref 6–23)
CALCIUM: 9.1 mg/dL (ref 8.4–10.5)
CO2: 27 mmol/L (ref 19–32)
Chloride: 104 mmol/L (ref 96–112)
Creatinine, Ser: 1.61 mg/dL — ABNORMAL HIGH (ref 0.50–1.35)
GFR calc non Af Amer: 42 mL/min — ABNORMAL LOW (ref >90)
GFR, EST AFRICAN AMERICAN: 49 mL/min — AB (ref >90)
Glucose, Bld: 106 mg/dL — ABNORMAL HIGH (ref 70–99)
Potassium: 4.2 mmol/L (ref 3.5–5.1)
Sodium: 139 mmol/L (ref 135–145)

## 2014-08-31 LAB — FERRITIN: Ferritin: 699 ng/mL — ABNORMAL HIGH (ref 22–322)

## 2014-08-31 LAB — POCT HEMOGLOBIN-HEMACUE: HEMOGLOBIN: 11.2 g/dL — AB (ref 13.0–17.0)

## 2014-08-31 MED ORDER — EPOETIN ALFA 10000 UNIT/ML IJ SOLN
10000.0000 [IU] | INTRAMUSCULAR | Status: DC
  Administered 2014-08-31: 10000 [IU] via SUBCUTANEOUS

## 2014-08-31 MED ORDER — EPOETIN ALFA 10000 UNIT/ML IJ SOLN
INTRAMUSCULAR | Status: AC
  Filled 2014-08-31: qty 1

## 2014-09-06 ENCOUNTER — Encounter (HOSPITAL_COMMUNITY)
Admission: RE | Admit: 2014-09-06 | Discharge: 2014-09-06 | Disposition: A | Discharge status: Home / Self Care | Payer: Managed Care, Other (non HMO) | Source: Ambulatory Visit | Attending: Nephrology | Admitting: Nephrology

## 2014-09-06 DIAGNOSIS — D631 Anemia in chronic kidney disease: Secondary | ICD-10-CM | POA: Diagnosis not present

## 2014-09-06 LAB — POCT HEMOGLOBIN-HEMACUE: Hemoglobin: 11.7 g/dL — ABNORMAL LOW (ref 13.0–17.0)

## 2014-09-06 MED ORDER — EPOETIN ALFA 10000 UNIT/ML IJ SOLN
10000.0000 [IU] | INTRAMUSCULAR | Status: DC
  Administered 2014-09-06: 10000 [IU] via SUBCUTANEOUS

## 2014-09-06 MED ORDER — EPOETIN ALFA 10000 UNIT/ML IJ SOLN
INTRAMUSCULAR | Status: AC
  Filled 2014-09-06: qty 1

## 2014-09-13 ENCOUNTER — Encounter (HOSPITAL_COMMUNITY)
Admission: RE | Admit: 2014-09-13 | Discharge: 2014-09-13 | Disposition: A | Discharge status: Home / Self Care | Payer: Managed Care, Other (non HMO) | Source: Ambulatory Visit | Attending: Nephrology | Admitting: Nephrology

## 2014-09-13 DIAGNOSIS — D631 Anemia in chronic kidney disease: Secondary | ICD-10-CM | POA: Diagnosis not present

## 2014-09-13 LAB — POCT HEMOGLOBIN-HEMACUE: HEMOGLOBIN: 11.8 g/dL — AB (ref 13.0–17.0)

## 2014-09-13 MED ORDER — EPOETIN ALFA 10000 UNIT/ML IJ SOLN
10000.0000 [IU] | INTRAMUSCULAR | Status: DC
  Administered 2014-09-13: 10000 [IU] via SUBCUTANEOUS

## 2014-09-13 MED ORDER — EPOETIN ALFA 10000 UNIT/ML IJ SOLN
INTRAMUSCULAR | Status: AC
  Filled 2014-09-13: qty 1

## 2014-09-14 ENCOUNTER — Other Ambulatory Visit: Payer: Self-pay

## 2014-09-14 ENCOUNTER — Telehealth: Payer: Self-pay | Admitting: Internal Medicine

## 2014-09-14 ENCOUNTER — Other Ambulatory Visit: Payer: Self-pay | Admitting: Internal Medicine

## 2014-09-14 ENCOUNTER — Other Ambulatory Visit (INDEPENDENT_AMBULATORY_CARE_PROVIDER_SITE_OTHER): Payer: Managed Care, Other (non HMO)

## 2014-09-14 DIAGNOSIS — Z79899 Other long term (current) drug therapy: Secondary | ICD-10-CM

## 2014-09-14 DIAGNOSIS — Z7960 Long term (current) use of unspecified immunomodulators and immunosuppressants: Secondary | ICD-10-CM

## 2014-09-14 DIAGNOSIS — K5190 Ulcerative colitis, unspecified, without complications: Secondary | ICD-10-CM

## 2014-09-14 LAB — HEPATIC FUNCTION PANEL
ALK PHOS: 73 U/L (ref 39–117)
ALT: 15 U/L (ref 0–53)
AST: 18 U/L (ref 0–37)
Albumin: 3.1 g/dL — ABNORMAL LOW (ref 3.5–5.2)
BILIRUBIN DIRECT: 0.1 mg/dL (ref 0.0–0.3)
BILIRUBIN TOTAL: 0.5 mg/dL (ref 0.2–1.2)
Total Protein: 6.4 g/dL (ref 6.0–8.3)

## 2014-09-14 LAB — CBC WITH DIFFERENTIAL/PLATELET
Basophils Absolute: 0.1 10*3/uL (ref 0.0–0.1)
Basophils Relative: 0.5 % (ref 0.0–3.0)
Eosinophils Absolute: 0.1 10*3/uL (ref 0.0–0.7)
Eosinophils Relative: 0.9 % (ref 0.0–5.0)
HCT: 36.2 % — ABNORMAL LOW (ref 39.0–52.0)
HEMOGLOBIN: 11.9 g/dL — AB (ref 13.0–17.0)
LYMPHS PCT: 27.4 % (ref 12.0–46.0)
Lymphs Abs: 2.6 10*3/uL (ref 0.7–4.0)
MCHC: 32.8 g/dL (ref 30.0–36.0)
MCV: 100.8 fl — ABNORMAL HIGH (ref 78.0–100.0)
Monocytes Absolute: 1.1 10*3/uL — ABNORMAL HIGH (ref 0.1–1.0)
Monocytes Relative: 11.7 % (ref 3.0–12.0)
Neutro Abs: 5.7 10*3/uL (ref 1.4–7.7)
Neutrophils Relative %: 59.5 % (ref 43.0–77.0)
Platelets: 457 10*3/uL — ABNORMAL HIGH (ref 150.0–400.0)
RBC: 3.59 Mil/uL — AB (ref 4.22–5.81)
RDW: 18.6 % — ABNORMAL HIGH (ref 11.5–15.5)
WBC: 9.6 10*3/uL (ref 4.0–10.5)

## 2014-09-14 NOTE — Progress Notes (Signed)
Quick Note:  Labs are ok Please let him know Needs to repeat same in 3 months I will see him 4/27 appt - hope he is well (let me know if not) ______

## 2014-09-14 NOTE — Telephone Encounter (Signed)
See result note.  

## 2014-09-14 NOTE — Telephone Encounter (Signed)
Ok to refill Sir?

## 2014-09-15 NOTE — Telephone Encounter (Signed)
Refill x 3 

## 2014-09-19 ENCOUNTER — Other Ambulatory Visit: Payer: Self-pay | Admitting: Internal Medicine

## 2014-09-21 ENCOUNTER — Encounter (HOSPITAL_COMMUNITY)
Admission: RE | Admit: 2014-09-21 | Discharge: 2014-09-21 | Disposition: A | Discharge status: Home / Self Care | Payer: Managed Care, Other (non HMO) | Source: Ambulatory Visit | Attending: Nephrology | Admitting: Nephrology

## 2014-09-21 DIAGNOSIS — D631 Anemia in chronic kidney disease: Secondary | ICD-10-CM | POA: Diagnosis present

## 2014-09-21 DIAGNOSIS — N183 Chronic kidney disease, stage 3 (moderate): Secondary | ICD-10-CM | POA: Diagnosis not present

## 2014-09-21 LAB — POCT HEMOGLOBIN-HEMACUE: Hemoglobin: 11.8 g/dL — ABNORMAL LOW (ref 13.0–17.0)

## 2014-09-21 MED ORDER — EPOETIN ALFA 10000 UNIT/ML IJ SOLN
INTRAMUSCULAR | Status: AC
  Administered 2014-09-21: 10000 [IU] via SUBCUTANEOUS
  Filled 2014-09-21: qty 1

## 2014-09-21 MED ORDER — EPOETIN ALFA 10000 UNIT/ML IJ SOLN
10000.0000 [IU] | INTRAMUSCULAR | Status: DC
  Administered 2014-09-21: 10000 [IU] via SUBCUTANEOUS

## 2014-09-28 ENCOUNTER — Encounter (HOSPITAL_COMMUNITY)
Admission: RE | Admit: 2014-09-28 | Discharge: 2014-09-28 | Disposition: A | Discharge status: Home / Self Care | Payer: Managed Care, Other (non HMO) | Source: Ambulatory Visit | Attending: Nephrology | Admitting: Nephrology

## 2014-09-28 DIAGNOSIS — D631 Anemia in chronic kidney disease: Secondary | ICD-10-CM | POA: Diagnosis not present

## 2014-09-28 LAB — BASIC METABOLIC PANEL
ANION GAP: 6 (ref 5–15)
BUN: 24 mg/dL — ABNORMAL HIGH (ref 6–23)
CALCIUM: 9.3 mg/dL (ref 8.4–10.5)
CO2: 31 mmol/L (ref 19–32)
Chloride: 101 mmol/L (ref 96–112)
Creatinine, Ser: 1.77 mg/dL — ABNORMAL HIGH (ref 0.50–1.35)
GFR calc non Af Amer: 37 mL/min — ABNORMAL LOW (ref >90)
GFR, EST AFRICAN AMERICAN: 43 mL/min — AB (ref >90)
Glucose, Bld: 114 mg/dL — ABNORMAL HIGH (ref 70–99)
POTASSIUM: 4.8 mmol/L (ref 3.5–5.1)
SODIUM: 138 mmol/L (ref 135–145)

## 2014-09-28 LAB — IRON AND TIBC
Iron: 63 ug/dL (ref 42–165)
SATURATION RATIOS: 41 % (ref 20–55)
TIBC: 152 ug/dL — ABNORMAL LOW (ref 215–435)
UIBC: 89 ug/dL — ABNORMAL LOW (ref 125–400)

## 2014-09-28 LAB — POCT HEMOGLOBIN-HEMACUE: Hemoglobin: 11.9 g/dL — ABNORMAL LOW (ref 13.0–17.0)

## 2014-09-28 LAB — FERRITIN: Ferritin: 580 ng/mL — ABNORMAL HIGH (ref 22–322)

## 2014-09-28 MED ORDER — EPOETIN ALFA 10000 UNIT/ML IJ SOLN
INTRAMUSCULAR | Status: AC
  Administered 2014-09-28: 10000 [IU] via SUBCUTANEOUS
  Filled 2014-09-28: qty 1

## 2014-09-28 MED ORDER — EPOETIN ALFA 10000 UNIT/ML IJ SOLN
10000.0000 [IU] | INTRAMUSCULAR | Status: DC
  Administered 2014-09-28: 10000 [IU] via SUBCUTANEOUS

## 2014-10-04 ENCOUNTER — Encounter (HOSPITAL_COMMUNITY)
Admission: RE | Admit: 2014-10-04 | Discharge: 2014-10-04 | Disposition: A | Discharge status: Home / Self Care | Payer: Managed Care, Other (non HMO) | Source: Ambulatory Visit | Attending: Nephrology | Admitting: Nephrology

## 2014-10-04 DIAGNOSIS — D631 Anemia in chronic kidney disease: Secondary | ICD-10-CM | POA: Diagnosis not present

## 2014-10-04 LAB — POCT HEMOGLOBIN-HEMACUE: Hemoglobin: 11.1 g/dL — ABNORMAL LOW (ref 13.0–17.0)

## 2014-10-04 MED ORDER — EPOETIN ALFA 10000 UNIT/ML IJ SOLN
10000.0000 [IU] | INTRAMUSCULAR | Status: DC
  Administered 2014-10-04: 10000 [IU] via SUBCUTANEOUS

## 2014-10-04 MED ORDER — EPOETIN ALFA 10000 UNIT/ML IJ SOLN
INTRAMUSCULAR | Status: AC
  Filled 2014-10-04: qty 1

## 2014-10-06 ENCOUNTER — Other Ambulatory Visit: Payer: Self-pay | Admitting: Internal Medicine

## 2014-10-11 ENCOUNTER — Encounter (HOSPITAL_COMMUNITY): Payer: Managed Care, Other (non HMO)

## 2014-10-13 ENCOUNTER — Ambulatory Visit: Payer: Managed Care, Other (non HMO) | Admitting: Internal Medicine

## 2014-10-20 ENCOUNTER — Encounter (HOSPITAL_COMMUNITY)
Admission: RE | Admit: 2014-10-20 | Discharge: 2014-10-20 | Disposition: A | Discharge status: Home / Self Care | Payer: Managed Care, Other (non HMO) | Source: Ambulatory Visit | Attending: Nephrology | Admitting: Nephrology

## 2014-10-20 DIAGNOSIS — D631 Anemia in chronic kidney disease: Secondary | ICD-10-CM | POA: Diagnosis not present

## 2014-10-20 DIAGNOSIS — N183 Chronic kidney disease, stage 3 (moderate): Secondary | ICD-10-CM | POA: Insufficient documentation

## 2014-10-20 LAB — POCT HEMOGLOBIN-HEMACUE: HEMOGLOBIN: 11.4 g/dL — AB (ref 13.0–17.0)

## 2014-10-20 MED ORDER — EPOETIN ALFA 10000 UNIT/ML IJ SOLN
10000.0000 [IU] | INTRAMUSCULAR | Status: DC
  Administered 2014-10-20: 10000 [IU] via SUBCUTANEOUS

## 2014-10-20 MED ORDER — EPOETIN ALFA 10000 UNIT/ML IJ SOLN
INTRAMUSCULAR | Status: AC
  Filled 2014-10-20: qty 1

## 2014-11-03 ENCOUNTER — Encounter (HOSPITAL_COMMUNITY): Payer: Managed Care, Other (non HMO)

## 2014-11-04 ENCOUNTER — Other Ambulatory Visit: Payer: Self-pay | Admitting: Internal Medicine

## 2014-11-29 ENCOUNTER — Telehealth: Payer: Self-pay | Admitting: Internal Medicine

## 2014-11-29 NOTE — Telephone Encounter (Signed)
Spoke to [facility] pharmacy and they ran his 6MP thru today at 1:40PM with no problem.  Called and left this information on Sean Pope's voice mail.

## 2014-12-13 ENCOUNTER — Other Ambulatory Visit: Payer: Self-pay | Admitting: Internal Medicine

## 2014-12-13 ENCOUNTER — Telehealth: Payer: Self-pay | Admitting: Internal Medicine

## 2014-12-13 NOTE — Telephone Encounter (Signed)
Patient's questions answered.  He will have his nephrologist fax over the labs that he had drawn last week.

## 2014-12-13 NOTE — Telephone Encounter (Signed)
Left message for patient to call back  

## 2014-12-16 ENCOUNTER — Encounter: Payer: Self-pay | Admitting: Internal Medicine

## 2014-12-16 ENCOUNTER — Ambulatory Visit (INDEPENDENT_AMBULATORY_CARE_PROVIDER_SITE_OTHER): Payer: Managed Care, Other (non HMO) | Admitting: Internal Medicine

## 2014-12-16 VITALS — BP 126/72 | HR 84 | Ht 70.0 in | Wt 149.0 lb

## 2014-12-16 DIAGNOSIS — Z23 Encounter for immunization: Secondary | ICD-10-CM

## 2014-12-16 DIAGNOSIS — M810 Age-related osteoporosis without current pathological fracture: Secondary | ICD-10-CM

## 2014-12-16 DIAGNOSIS — N189 Chronic kidney disease, unspecified: Secondary | ICD-10-CM

## 2014-12-16 DIAGNOSIS — K5100 Ulcerative (chronic) pancolitis without complications: Secondary | ICD-10-CM

## 2014-12-16 DIAGNOSIS — Z79899 Other long term (current) drug therapy: Secondary | ICD-10-CM | POA: Diagnosis not present

## 2014-12-16 DIAGNOSIS — D631 Anemia in chronic kidney disease: Secondary | ICD-10-CM

## 2014-12-16 DIAGNOSIS — E559 Vitamin D deficiency, unspecified: Secondary | ICD-10-CM

## 2014-12-16 DIAGNOSIS — Z7960 Long term (current) use of unspecified immunomodulators and immunosuppressants: Secondary | ICD-10-CM

## 2014-12-16 NOTE — Assessment & Plan Note (Signed)
Improved - EPO on hold

## 2014-12-16 NOTE — Progress Notes (Signed)
   Subjective:    Patient ID: Sean Pope, male    DOB: 05/04/1946, 69 y.o.   MRN: [id number] Cc: follow-up ulcerative colitis on Humira and 6MP HPI  Lomotil 1 qd when working otherwise has stools often after eating but not loose and urgent 1 stool a night 0130. Formed  Weight stable  No pain  Has energy to cut grass again.  Quit EPO injections - Cost - reapplied and Posey Pronto but still too costly  Working 60 hrs a week  Medications, allergies, past medical history, past surgical history, family history and social history are reviewed and updated in the EMR.  Review of Systems As above    Objective:   Physical Exam @BP  126/72 mmHg  Pulse 84  Ht 5' 10"  (1.778 m)  Wt 149 lb (67.586 kg)  BMI 21.38 kg/m2@  General:  NAD Eyes:   anicteric Lungs:  clear Heart:: S1S2 no rubs, murmurs or gallops Abdomen:  soft and nontender, BS+ Ext:   no edema, cyanosis or clubbing    Data Reviewed:  Wt Readings from Last 3 Encounters:  12/16/14 149 lb (67.586 kg)  06/22/14 140 lb (63.504 kg)  06/02/14 147 lb 9.6 oz (66.951 kg)       Assessment & Plan:  Ulcerative chronic pancolitis Doing very well overall - sounds like has some IBS also Consider repeating colonoscopy later in 2016 or 2017 to document healing Continue Humira, 6 MP and lomotil  Long-term use of immunosuppressant medication-mercaptopurine and Humira Ok Recheck labs this week Hepatitis vaccinations completed  Anemia-multifactorial Improved - EPO on hold  Vitamin D deficiency Last level 35.7 ok 6/23 Dr. Posey Pronto  Osteoporosis  on Tx - weekly alendronate Will remind him to seek help on this from PCP     Current outpatient prescriptions:  .  diphenoxylate-atropine (LOMOTIL) 2.5-0.025 MG per tablet, TAKE 1 TO 2 TABLETS BY MOUTH EVERY 4 HOURS AS NEEDED FOR DIARRHEA, Disp: 120 tablet, Rfl: 1 .  Epoetin Alfa (PROCRIT IJ), As directed starting on 04/27/14  HOLDING, Disp: , Rfl:  .  ergocalciferol (VITAMIN D2) 50000  UNITS capsule, Take 1 capsule (50,000 Units total) by mouth once a week., Disp: 8 capsule, Rfl: 0 .  Ferrous Sulfate (IRON) 325 (65 FE) MG TABS, Take 1 tablet by mouth 2 (two) times daily. , Disp: , Rfl:  .  furosemide (LASIX) 40 MG tablet, Take 2 tablets (80 mg total) by mouth daily. (Patient taking differently: Take 40 mg by mouth daily. ), Disp: 30 tablet, Rfl: 0 .  HUMIRA PEN 40 MG/0.8ML PNKT, INJECT 40 MG SUBCUTANEOUSLY EVERY 14 DAYS FOR MAINTENANCE, Disp: 1 each, Rfl: 5 .  KLOR-CON M20 20 MEQ tablet, TAKE 1 TABLET BY MOUTH EVERY DAY (Patient taking differently: TAKE 1 TABLET BY MOUTH EVERY OTHER DAY), Disp: 30 tablet, Rfl: 2 .  mercaptopurine (PURINETHOL) 50 MG tablet, TAKE 1 & 1/2 TABLETS BY MOUTH EVERY DAY ON AN EMPTY STOMACH 1 HR BEFORE OR 2 HRS AFTER MEALS, Disp: 45 tablet, Rfl: 2 .  Multiple Vitamins-Minerals (MENS MULTIVITAMIN PLUS PO), Take 1 tablet by mouth., Disp: , Rfl:  .  simvastatin (ZOCOR) 40 MG tablet, Take 40 mg by mouth every evening., Disp: , Rfl:  .  alendronate (FOSAMAX) 70 MG tablet, Take 70 mg by mouth once a week., Disp: , Rfl: 3  cc: MILLER, Serina Cowper, MD Elmarie Shiley, MD

## 2014-12-16 NOTE — Patient Instructions (Addendum)
  Today you have been given your year booster Twin rix vaccine.   Please get your labs drawn and we will let you know the results.   Please follow up with [place] in 6 months, we will contact you and set this up.    I appreciate the opportunity to care for you. Silvano Rusk, MD, [facility]

## 2014-12-16 NOTE — Assessment & Plan Note (Signed)
on Tx - weekly alendronate Will remind him to seek help on this from PCP

## 2014-12-16 NOTE — Assessment & Plan Note (Addendum)
Ok Recheck labs this week Hepatitis vaccinations completed

## 2014-12-16 NOTE — Assessment & Plan Note (Signed)
Last level 35.7 ok 6/23 Dr. Posey Pronto

## 2014-12-16 NOTE — Assessment & Plan Note (Addendum)
Doing very well overall - sounds like has some IBS also Consider repeating colonoscopy later in 2016 or 2017 to document healing Continue Humira, 6 MP and lomotil

## 2014-12-17 ENCOUNTER — Other Ambulatory Visit: Payer: Self-pay

## 2014-12-17 ENCOUNTER — Other Ambulatory Visit (INDEPENDENT_AMBULATORY_CARE_PROVIDER_SITE_OTHER): Payer: Managed Care, Other (non HMO)

## 2014-12-17 DIAGNOSIS — K5190 Ulcerative colitis, unspecified, without complications: Secondary | ICD-10-CM

## 2014-12-17 DIAGNOSIS — D631 Anemia in chronic kidney disease: Secondary | ICD-10-CM

## 2014-12-17 DIAGNOSIS — N189 Chronic kidney disease, unspecified: Principal | ICD-10-CM

## 2014-12-17 LAB — CBC WITH DIFFERENTIAL/PLATELET
BASOS PCT: 0.5 % (ref 0.0–3.0)
Basophils Absolute: 0.1 10*3/uL (ref 0.0–0.1)
EOS ABS: 0.1 10*3/uL (ref 0.0–0.7)
Eosinophils Relative: 1.1 % (ref 0.0–5.0)
HCT: 30.5 % — ABNORMAL LOW (ref 39.0–52.0)
Hemoglobin: 10 g/dL — ABNORMAL LOW (ref 13.0–17.0)
LYMPHS ABS: 2.8 10*3/uL (ref 0.7–4.0)
LYMPHS PCT: 25.2 % (ref 12.0–46.0)
MCHC: 32.9 g/dL (ref 30.0–36.0)
MCV: 102.3 fl — ABNORMAL HIGH (ref 78.0–100.0)
MONO ABS: 1.2 10*3/uL — AB (ref 0.1–1.0)
Monocytes Relative: 10.8 % (ref 3.0–12.0)
NEUTROS ABS: 7 10*3/uL (ref 1.4–7.7)
NEUTROS PCT: 62.4 % (ref 43.0–77.0)
Platelets: 415 10*3/uL — ABNORMAL HIGH (ref 150.0–400.0)
RBC: 2.98 Mil/uL — ABNORMAL LOW (ref 4.22–5.81)
RDW: 18.8 % — AB (ref 11.5–15.5)
WBC: 11.1 10*3/uL — AB (ref 4.0–10.5)

## 2014-12-17 LAB — HEPATIC FUNCTION PANEL
ALT: 18 U/L (ref 0–53)
AST: 18 U/L (ref 0–37)
Albumin: 3.1 g/dL — ABNORMAL LOW (ref 3.5–5.2)
Alkaline Phosphatase: 75 U/L (ref 39–117)
Bilirubin, Direct: 0.1 mg/dL (ref 0.0–0.3)
Total Bilirubin: 0.6 mg/dL (ref 0.2–1.2)
Total Protein: 6.4 g/dL (ref 6.0–8.3)

## 2014-12-17 LAB — VITAMIN B12: VITAMIN B 12: 224 pg/mL (ref 211–911)

## 2014-12-17 NOTE — Progress Notes (Signed)
Quick Note:  Add B12 level please can use anemia of chronic kidney disease as dx ______

## 2014-12-21 ENCOUNTER — Encounter: Payer: Self-pay | Admitting: Internal Medicine

## 2014-12-21 DIAGNOSIS — E538 Deficiency of other specified B group vitamins: Secondary | ICD-10-CM

## 2014-12-21 HISTORY — DX: Deficiency of other specified B group vitamins: E53.8

## 2014-12-21 NOTE — Progress Notes (Signed)
Quick Note:  His labs show persistent anemia B12 level is borderline low so he should have 1000ug cyanocobalmin injected weekly x 4 and then every 3 months See if he can do this through PCP Sabra Heck) or renal Posey Pronto) unless we can do that but I thought we could not keep B12 around lately   Repeat CBC and LFT and B12 in 3 months  Fax copies to Dr. Elmarie Shiley and Kathyrn Lass ______

## 2014-12-27 ENCOUNTER — Other Ambulatory Visit: Payer: Self-pay

## 2014-12-27 DIAGNOSIS — K5100 Ulcerative (chronic) pancolitis without complications: Secondary | ICD-10-CM

## 2014-12-27 MED ORDER — CYANOCOBALAMIN 1000 MCG/ML IJ SOLN: INTRAMUSCULAR | Status: DC

## 2015-01-05 ENCOUNTER — Telehealth: Payer: Self-pay | Admitting: Internal Medicine

## 2015-01-05 NOTE — Telephone Encounter (Signed)
I spoke with Sharee Pimple at Dr. Ammie Ferrier and faxed notes/labs to her

## 2015-01-28 ENCOUNTER — Other Ambulatory Visit: Payer: Self-pay | Admitting: Internal Medicine

## 2015-02-02 ENCOUNTER — Other Ambulatory Visit: Payer: Self-pay | Admitting: Internal Medicine

## 2015-02-28 ENCOUNTER — Other Ambulatory Visit: Payer: Self-pay | Admitting: Internal Medicine

## 2015-02-28 NOTE — Telephone Encounter (Signed)
6 

## 2015-02-28 NOTE — Telephone Encounter (Signed)
How many refills Sir?

## 2015-03-09 ENCOUNTER — Other Ambulatory Visit: Payer: Self-pay

## 2015-03-09 DIAGNOSIS — K51919 Ulcerative colitis, unspecified with unspecified complications: Secondary | ICD-10-CM

## 2015-04-29 ENCOUNTER — Other Ambulatory Visit: Payer: Self-pay | Admitting: Internal Medicine

## 2015-06-08 ENCOUNTER — Ambulatory Visit (INDEPENDENT_AMBULATORY_CARE_PROVIDER_SITE_OTHER): Payer: Managed Care, Other (non HMO) | Admitting: Internal Medicine

## 2015-06-08 ENCOUNTER — Other Ambulatory Visit (INDEPENDENT_AMBULATORY_CARE_PROVIDER_SITE_OTHER): Payer: Managed Care, Other (non HMO)

## 2015-06-08 ENCOUNTER — Encounter: Payer: Self-pay | Admitting: Internal Medicine

## 2015-06-08 VITALS — BP 120/70 | HR 72 | Ht 70.0 in | Wt 149.4 lb

## 2015-06-08 DIAGNOSIS — Z7960 Long term (current) use of unspecified immunomodulators and immunosuppressants: Secondary | ICD-10-CM

## 2015-06-08 DIAGNOSIS — Z79899 Other long term (current) drug therapy: Secondary | ICD-10-CM

## 2015-06-08 DIAGNOSIS — K5100 Ulcerative (chronic) pancolitis without complications: Secondary | ICD-10-CM | POA: Diagnosis not present

## 2015-06-08 DIAGNOSIS — E559 Vitamin D deficiency, unspecified: Secondary | ICD-10-CM

## 2015-06-08 DIAGNOSIS — M810 Age-related osteoporosis without current pathological fracture: Secondary | ICD-10-CM

## 2015-06-08 DIAGNOSIS — K51919 Ulcerative colitis, unspecified with unspecified complications: Secondary | ICD-10-CM

## 2015-06-08 LAB — CBC WITH DIFFERENTIAL/PLATELET
Basophils Absolute: 0 10*3/uL (ref 0.0–0.1)
Basophils Relative: 0.5 % (ref 0.0–3.0)
EOS PCT: 1.1 % (ref 0.0–5.0)
Eosinophils Absolute: 0.1 10*3/uL (ref 0.0–0.7)
HCT: 31.2 % — ABNORMAL LOW (ref 39.0–52.0)
Hemoglobin: 10.3 g/dL — ABNORMAL LOW (ref 13.0–17.0)
LYMPHS ABS: 2.5 10*3/uL (ref 0.7–4.0)
Lymphocytes Relative: 29.1 % (ref 12.0–46.0)
MCHC: 33 g/dL (ref 30.0–36.0)
MCV: 105 fl — ABNORMAL HIGH (ref 78.0–100.0)
MONO ABS: 0.8 10*3/uL (ref 0.1–1.0)
Monocytes Relative: 9.9 % (ref 3.0–12.0)
NEUTROS PCT: 59.4 % (ref 43.0–77.0)
Neutro Abs: 5.1 10*3/uL (ref 1.4–7.7)
Platelets: 507 10*3/uL — ABNORMAL HIGH (ref 150.0–400.0)
RBC: 2.97 Mil/uL — AB (ref 4.22–5.81)
RDW: 19.1 % — AB (ref 11.5–15.5)
WBC: 8.6 10*3/uL (ref 4.0–10.5)

## 2015-06-08 LAB — HEPATIC FUNCTION PANEL
ALBUMIN: 3.1 g/dL — AB (ref 3.5–5.2)
ALT: 16 U/L (ref 0–53)
AST: 17 U/L (ref 0–37)
Alkaline Phosphatase: 76 U/L (ref 39–117)
BILIRUBIN TOTAL: 0.6 mg/dL (ref 0.2–1.2)
Bilirubin, Direct: 0.1 mg/dL (ref 0.0–0.3)
TOTAL PROTEIN: 6.5 g/dL (ref 6.0–8.3)

## 2015-06-08 LAB — VITAMIN D 25 HYDROXY (VIT D DEFICIENCY, FRACTURES): VITD: 30.31 ng/mL (ref 30.00–100.00)

## 2015-06-08 LAB — C-REACTIVE PROTEIN: CRP: 3.1 mg/dL (ref 0.5–20.0)

## 2015-06-08 NOTE — Patient Instructions (Signed)
Your physician has requested that you go to the basement for the lab work before leaving today.   It has been recommended to you by your physician that you have a(n) Colonoscopy completed. Per your request, we did not schedule the procedure(s) today. We will contact you when we get more dates opened up.     I appreciate the opportunity to care for you. Silvano Rusk, MD, [facility]

## 2015-06-08 NOTE — Assessment & Plan Note (Signed)
Labs today Had influenza vaccine

## 2015-06-08 NOTE — Assessment & Plan Note (Signed)
F/u through PCP/Renal -

## 2015-06-08 NOTE — Assessment & Plan Note (Signed)
recheck

## 2015-06-08 NOTE — Progress Notes (Signed)
Subjective:    Patient ID: Sean Pope, male    DOB: November 26, 1945, 69 y.o.   MRN: [id number] Cc; f/u ulcerative colitis HPI Overall might doing pretty well though he takes one or 2 Lomotil a day. Without that he says you have problems. 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 He worries that he will do when it's the time to stop. He is pleased that he remained out of the hospital in 2016. Medications, allergies, past medical history, past surgical history, family history and social history are reviewed and updated in the EMR. Wt Readings from Last 3 Encounters:  06/08/15 149 lb 6.4 oz (67.767 kg)  12/16/14 149 lb (67.586 kg)  06/22/14 140 lb (63.504 kg)     Review of Systems As above    Objective:   Physical Exam @BP  120/70 mmHg  Pulse 72  Ht 5' 10"  (1.778 m)  Wt 149 lb 6.4 oz (67.767 kg)  BMI 21.44 kg/m2@  General:  NAD Eyes:   anicteric Lungs:  clear Heart:: S1S2 no rubs, murmurs or gallops Abdomen:  soft and nontender, BS+ Ext:   no edema, cyanosis or clubbing     Assessment & Plan:   Ulcerative chronic pancolitis Mild diarrhea - controlled w/ Lomotil 1-2 a day Colonoscopy to reassess Continue Humira and mercaptopurine The risks and benefits as well as alternatives of endoscopic procedure(s) have been discussed and reviewed. All questions answered. The patient agrees to proceed.   Long-term use of immunosuppressant medication-mercaptopurine and Humira Labs today Had influenza vaccine  Vitamin D deficiency recheck  Osteoporosis F/u through PCP/Renal -     Lab Results  Component Value Date   WBC 8.6 06/08/2015   HGB 10.3* 06/08/2015   HCT 31.2* 06/08/2015   MCV 105.0* 06/08/2015   PLT 507.0* 06/08/2015   Lab Results  Component Value Date   ALT 16 06/08/2015   AST 17 06/08/2015   ALKPHOS 76 06/08/2015   BILITOT 0.6 06/08/2015   C-reactive protein normal at 3.1 Vitamin D level okay at 30.31, probably normal for winter. QuantiFERON  Gold TB test is pending    Current outpatient prescriptions:  .  cyanocobalamin (,VITAMIN B-12,) 1000 MCG/ML injection, Inject 1000 mcg IM weekly x 4 then q 3 months, Disp: 1 mL, Rfl: prn .  diphenoxylate-atropine (LOMOTIL) 2.5-0.025 MG per tablet, TAKE 1 TO 2 TABLETS BY MOUTH EVERY 4 HOURS AS NEEDED FOR DIARRHEA, Disp: 120 tablet, Rfl: 5 .  Ferrous Sulfate (IRON) 325 (65 FE) MG TABS, Take 1 tablet by mouth 2 (two) times daily. , Disp: , Rfl:  .  furosemide (LASIX) 40 MG tablet, Take 2 tablets (80 mg total) by mouth daily. (Patient taking differently: Take 40 mg by mouth daily. ), Disp: 30 tablet, Rfl: 0 .  HUMIRA PEN 40 MG/0.8ML PNKT, INJECT 40 MG UNDER THE SKIN EVERY 14 DAYS FOR MAINTENANCE, Disp: 2 each, Rfl: 4 .  KLOR-CON M20 20 MEQ tablet, TAKE 1 TABLET BY MOUTH EVERY DAY (Patient taking differently: TAKE 1 TABLET BY MOUTH EVERY OTHER DAY), Disp: 30 tablet, Rfl: 2 .  mercaptopurine (PURINETHOL) 50 MG tablet, TAKE 1 AND 1/2 TABLET BY MOUTH EVERY DAY ON AN EMPTY STOMACH 1 HOURS BEFORE OR 2 HOURS AFTER MEALS, Disp: 45 tablet, Rfl: 2 .  Multiple Vitamins-Minerals (MENS MULTIVITAMIN PLUS PO), Take 1 tablet by mouth., Disp: , Rfl:  .  simvastatin (ZOCOR) 40 MG tablet, Take 40 mg by mouth every evening., Disp: , Rfl:  .  fluticasone (CUTIVATE) 0.05 % cream, APPLY TO AFFECTED AREA TWICE A DAY AS NEEDED, Disp: , Rfl: 2  I appreciate the opportunity to care for this patient. CC: MILLER, Serina Cowper, MD Dr. Elmarie Shiley of nephrology .

## 2015-06-08 NOTE — Assessment & Plan Note (Addendum)
Mild diarrhea - controlled w/ Lomotil 1-2 a day Colonoscopy to reassess Continue Humira and mercaptopurine The risks and benefits as well as alternatives of endoscopic procedure(s) have been discussed and reviewed. All questions answered. The patient agrees to proceed.

## 2015-06-09 LAB — QUANTIFERON TB GOLD ASSAY (BLOOD)
INTERFERON GAMMA RELEASE ASSAY: POSITIVE — AB
Mitogen value: 9.79 IU/mL
QUANTIFERON TB AG MINUS NIL: 1.78 [IU]/mL
Quantiferon Nil Value: 0.08 IU/mL
TB Ag value: 1.86 IU/mL

## 2015-06-09 NOTE — Progress Notes (Signed)
Quick Note:  TB test pending but other labs ok Some stable to improved mild abnormalities (Hgb, albumin) Will let him know when TB test back ______

## 2015-06-10 ENCOUNTER — Other Ambulatory Visit: Payer: Self-pay

## 2015-06-10 DIAGNOSIS — R7611 Nonspecific reaction to tuberculin skin test without active tuberculosis: Secondary | ICD-10-CM

## 2015-06-10 NOTE — Progress Notes (Signed)
Quick Note:  TB blood test is + Could be false +??  Need to sort out more 1) CXR PA/Lat dx + TB test 2) need him to do skin PPD test next week also 3) When is he due for Humira next?   ______

## 2015-06-21 ENCOUNTER — Ambulatory Visit (INDEPENDENT_AMBULATORY_CARE_PROVIDER_SITE_OTHER)
Admission: RE | Admit: 2015-06-21 | Discharge: 2015-06-21 | Disposition: A | Discharge status: Home / Self Care | Payer: Managed Care, Other (non HMO) | Source: Ambulatory Visit | Attending: Internal Medicine | Admitting: Internal Medicine

## 2015-06-21 ENCOUNTER — Ambulatory Visit (INDEPENDENT_AMBULATORY_CARE_PROVIDER_SITE_OTHER): Payer: Managed Care, Other (non HMO) | Admitting: Internal Medicine

## 2015-06-21 DIAGNOSIS — R7611 Nonspecific reaction to tuberculin skin test without active tuberculosis: Secondary | ICD-10-CM

## 2015-06-21 DIAGNOSIS — K51918 Ulcerative colitis, unspecified with other complication: Secondary | ICD-10-CM

## 2015-06-24 LAB — TB SKIN TEST
Induration: 2 mm
TB Skin Test: NEGATIVE

## 2015-06-24 NOTE — Progress Notes (Signed)
Quick Note:  I left him a message to hold Humira and we will have him see [facility] re+ + quantiferon ______

## 2015-06-27 ENCOUNTER — Telehealth: Payer: Self-pay

## 2015-06-27 DIAGNOSIS — R7612 Nonspecific reaction to cell mediated immunity measurement of gamma interferon antigen response without active tuberculosis: Secondary | ICD-10-CM

## 2015-06-27 NOTE — Telephone Encounter (Signed)
Per Dr. Carlean Purl patient is to be referred to [facility] for positive quantiferon gold.  Referral order placed.  Their office is closed due to the weather.  I left a message for the patient that he will be contacted for an appt by Dr. Storm Frisk office.

## 2015-06-29 NOTE — Telephone Encounter (Signed)
I spoke with Dianne with [facility] and they will have Dr. Baxter Flattery review and contact the patient

## 2015-07-04 ENCOUNTER — Telehealth: Payer: Self-pay

## 2015-07-04 NOTE — Telephone Encounter (Signed)
Spoke with Sean Pope and informed him that we are cancelling his colon and pre-visit for now per Dr Carlean Purl.  I spoke with Joycelyn Schmid in Dr Storm Frisk office and they are working on getting him an appointment and they know to call his cell number.  I will have to contact him later about an April office visit once the schedule is open.

## 2015-07-04 NOTE — Telephone Encounter (Signed)
-----   Message from Gatha Mayer, MD sent at 07/04/2015 11:40 AM EST ----- Since he had a + TB quantiferon i want to hold off on the colonoscopy at present - would like to wait to see when we can get him back on Humira before I do the colonoscopy  And now with chronic kidney damage prep o Pik is not acceptable but not an issue immediately since canceling colonoscopy   Please double check with [facility] about scheduling his visit w/ Dr. Baxter Flattery - sheri put referral in but I do not see an appt yet  Let's plan for him to see me in april   ----- Message -----    From: Sean Pope, [place]: 07/04/2015  11:16 AM      To: Gatha Mayer, MD  I'm setting him up for his colonoscopy and he is requesting the same prep as in 2014 which was prep o pik , is this medically ok for him Sir?  Thank you.

## 2015-07-05 NOTE — Telephone Encounter (Signed)
Patient has been scheduled for Dr. Baxter Flattery 07/12/15.  Appt was set up with the patient and he is aware

## 2015-07-12 ENCOUNTER — Encounter: Payer: Self-pay | Admitting: Internal Medicine

## 2015-07-12 ENCOUNTER — Ambulatory Visit (INDEPENDENT_AMBULATORY_CARE_PROVIDER_SITE_OTHER): Payer: Managed Care, Other (non HMO) | Admitting: Internal Medicine

## 2015-07-12 VITALS — BP 145/87 | HR 86 | Temp 98.9°F | Ht 70.5 in | Wt 151.0 lb

## 2015-07-12 DIAGNOSIS — R7611 Nonspecific reaction to tuberculin skin test without active tuberculosis: Secondary | ICD-10-CM | POA: Diagnosis not present

## 2015-07-12 DIAGNOSIS — Z227 Latent tuberculosis: Secondary | ICD-10-CM

## 2015-07-12 LAB — COMPLETE METABOLIC PANEL WITH GFR
ALBUMIN: 3 g/dL — AB (ref 3.6–5.1)
ALK PHOS: 68 U/L (ref 40–115)
ALT: 16 U/L (ref 9–46)
AST: 18 U/L (ref 10–35)
BUN: 23 mg/dL (ref 7–25)
CALCIUM: 8.5 mg/dL — AB (ref 8.6–10.3)
CO2: 31 mmol/L (ref 20–31)
CREATININE: 1.68 mg/dL — AB (ref 0.70–1.25)
Chloride: 102 mmol/L (ref 98–110)
GFR, Est African American: 47 mL/min — ABNORMAL LOW (ref >=60)
GFR, Est Non African American: 41 mL/min — ABNORMAL LOW (ref >=60)
GLUCOSE: 83 mg/dL (ref 65–99)
POTASSIUM: 4.5 mmol/L (ref 3.5–5.3)
SODIUM: 139 mmol/L (ref 135–146)
TOTAL PROTEIN: 5.8 g/dL — AB (ref 6.1–8.1)
Total Bilirubin: 0.6 mg/dL (ref 0.2–1.2)

## 2015-07-12 MED ORDER — RIFAPENTINE 150 MG PO TABS: 900.0000 mg | ORAL_TABLET | ORAL | Status: DC

## 2015-07-12 MED ORDER — ISONIAZID 300 MG PO TABS: 900.0000 mg | ORAL_TABLET | ORAL | Status: DC

## 2015-07-12 NOTE — Progress Notes (Signed)
Rfv: initial visit for + quantiferon Subjective:    Patient ID: Sean Pope, male    DOB: 05-03-1946, 70 y.o.   MRN: [id number]  HPI 70yo M with hx of ulcerative colitis, currently on mercaptopurine and humira, also has CKD, COPD, GERD. Initially diagnosed with UC in 2015, and patient has been on humira Q2 wk infusion for almost 2 years per his report. He previously was screened for LTBI in 2015, with skin test which was negative. Most recently he had repeat LBTI screening, now with quantiferon gold TB IGRA testing which is positive. He had concurrent PPD which was reported as 90m. He denies any symptoms c/w active TB. He did have CXR which shows signs of emphysema but no ghon's complex nor infiltrate suggestive of active TB.  He has been off of his humira for roughly 3 weeks. He does not have worsening symptoms presently. He controls his symptoms with mercaptopurine and some diet modification   [place] any excursions to the town. No illness from that trip. He works as an eCivil engineer, contracting but has not embalmed a body in over a year. [place] He is unaware if he has been in contact with anyone with active tb. No incarceration. No volunteering for the homeless or prisoners.  No Known Allergies Current Outpatient Prescriptions on File Prior to Visit  Medication Sig Dispense Refill  . cyanocobalamin (,VITAMIN B-12,) 1000 MCG/ML injection Inject 1000 mcg IM weekly x 4 then q 3 months 1 mL prn  . diphenoxylate-atropine (LOMOTIL) 2.5-0.025 MG per tablet TAKE 1 TO 2 TABLETS BY MOUTH EVERY 4 HOURS AS NEEDED FOR DIARRHEA 120 tablet 5  . Ferrous Sulfate (IRON) 325 (65 FE) MG TABS Take 1 tablet by mouth 2 (two) times daily.     . fluticasone (CUTIVATE) 0.05 % cream APPLY TO AFFECTED AREA TWICE A DAY AS NEEDED  2  . furosemide (LASIX) 40 MG tablet Take 2 tablets (80 mg total) by mouth daily. (Patient taking differently:  Take 40 mg by mouth daily. ) 30 tablet 0  . mercaptopurine (PURINETHOL) 50 MG tablet TAKE 1 AND 1/2 TABLET BY MOUTH EVERY DAY ON AN EMPTY STOMACH 1 HOURS BEFORE OR 2 HOURS AFTER MEALS 45 tablet 2  . Multiple Vitamins-Minerals (MENS MULTIVITAMIN PLUS PO) Take 1 tablet by mouth.    . simvastatin (ZOCOR) 40 MG tablet Take 40 mg by mouth every evening.    .Marland KitchenHUMIRA PEN 40 MG/0.8ML PNKT INJECT 40 MG UNDER THE SKIN EVERY 14 DAYS FOR MAINTENANCE (Patient not taking: Reported on 06/27/2015) 2 each 4   No current facility-administered medications on file prior to visit.   Active Ambulatory Problems    Diagnosis Date Noted  . Ulcerative chronic pancolitis ([place] 05/28/2013  . Chronic kidney disease, stage III (moderate) 09/08/2013  . GERD (gastroesophageal reflux disease) 10/26/2013  . Long-term use of immunosuppressant medication-mercaptopurine and Humira 01/05/2014  . Anemia-multifactorial 01/05/2014  . Pulmonary infiltrates 01/19/2014  . COPD with emphysema  GOLD III 01/20/2014  . Anal fissure - posterior 01/22/2014  . History of systemic steroid therapy 06/02/2014  . Vitamin D deficiency 06/03/2014  . Osteoporosis 06/29/2014  . B12 deficiency - borderline 12/21/2014   Resolved Ambulatory Problems    Diagnosis Date Noted  . Subarachnoid hemorrhage ([place] 09/17/2011  . Acute respiratory failure with hypoxia ([place] 09/17/2011  . 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 Altered mental status 09/18/2011  .  Lung collapse 09/26/2011  . Diarrhea 04/09/2013  . Abnormal CT scan, colon 04/09/2013  . Heartburn 04/09/2013  . Anemia 04/09/2013  . Orthostatic hypotension 09/08/2013  . Diarrhea 10/26/2013  . Protein-calorie malnutrition, severe ([place]) 10/26/2013  . Hypercalcemia 10/26/2013  . Edema 01/05/2014  . Hypoxia 01/07/2014  . Respiratory failure, acute ([place]) 01/07/2014  . PNA (pneumonia) 01/07/2014  . Leg swelling 01/19/2014  . C. difficile colitis 06/02/2014   Past Medical  History  Diagnosis Date  . Stroke ([place])   . SAH (subarachnoid hemorrhage) ([place])   . Hypertension   . Hyperlipidemia   . Ulcerative colitis ([place]) 05/28/2013  . AKI (acute kidney injury) [facility])    Social History  Substance Use Topics  . Smoking status: Former Smoker -- 0.50 packs/day for 50 years    Types: Cigarettes    Quit date: 06/19/2011  . Smokeless tobacco: Never Used  . Alcohol Use: 0.0 oz/week    0 Standard drinks or equivalent per week     Comment: rare  family history includes Heart disease in his mother.   Review of Systems   Constitutional: Negative for fever, chills, diaphoresis, activity change, appetite change, fatigue and unexpected weight change.  HENT: Negative for congestion, sore throat, rhinorrhea, sneezing, trouble swallowing and sinus pressure.  Eyes: Negative for photophobia and visual disturbance.  Respiratory: Negative for cough, chest tightness, shortness of breath, wheezing and stridor.  Cardiovascular: Negative for chest pain, palpitations and leg swelling.  Gastrointestinal: Negative for nausea, vomiting, abdominal pain, diarrhea, constipation, blood in stool, abdominal distention and anal bleeding.  Genitourinary: Negative for dysuria, hematuria, flank pain and difficulty urinating.  Musculoskeletal: Negative for myalgias, back pain, joint swelling, arthralgias and gait problem.  Skin: Negative for color change, pallor, rash and wound.  Neurological: Negative for dizziness, tremors, weakness and light-headedness.  Hematological: Negative for adenopathy. Does not bruise/bleed easily.  Psychiatric/Behavioral: Negative for behavioral problems, confusion, sleep disturbance, dysphoric mood, decreased concentration and agitation.       Objective:   Physical Exam  BP 145/87 mmHg  Pulse 86  Temp(Src) 98.9 F (37.2 C) (Oral)  Ht 5' 10.5" (1.791 m)  Wt 151 lb (68.493 kg)  BMI 21.35 kg/m2 Physical Exam  Constitutional: He is oriented to person, place,  and time. He appears well-developed and well-nourished. No distress.  HENT:  Mouth/Throat: Oropharynx is clear and moist. No oropharyngeal exudate.  Cardiovascular: Normal rate, regular rhythm and normal heart sounds. Exam reveals no gallop and no friction rub.  No murmur heard.  Pulmonary/Chest: Effort normal and breath sounds normal. No respiratory distress. He has no wheezes.  Abdominal: Soft. Bowel sounds are normal. He exhibits no distension. There is no tenderness.  Lymphadenopathy:  He has no cervical adenopathy.  Neurological: He is alert and oriented to person, place, and time.  Skin: Skin is warm and dry. No rash noted. No erythema.  Psychiatric: He has a normal mood and affect. His behavior is normal.      Assessment & Plan:  LTBI = we discussed various options for treating LTBI. We have chosen -> rifapentin 996m weekly plus inh 900 mg weekly. Will also do pyrodoxine 544mdaily  Drug interaction = zocor level will decrease while on rifapentin, since it will induce it temporarily by 3 months  Will check cmp today and in 4 wk  Cc: gessner

## 2015-07-24 ENCOUNTER — Other Ambulatory Visit: Payer: Self-pay | Admitting: Internal Medicine

## 2015-07-25 ENCOUNTER — Encounter: Payer: Managed Care, Other (non HMO) | Admitting: Internal Medicine

## 2015-08-09 ENCOUNTER — Other Ambulatory Visit: Payer: Medicare Other

## 2015-08-09 DIAGNOSIS — Z227 Latent tuberculosis: Secondary | ICD-10-CM

## 2015-08-09 LAB — COMPLETE METABOLIC PANEL WITH GFR
ALT: 11 U/L (ref 9–46)
AST: 21 U/L (ref 10–35)
Albumin: 2.8 g/dL — ABNORMAL LOW (ref 3.6–5.1)
Alkaline Phosphatase: 66 U/L (ref 40–115)
BUN: 31 mg/dL — ABNORMAL HIGH (ref 7–25)
CALCIUM: 8.5 mg/dL — AB (ref 8.6–10.3)
CHLORIDE: 103 mmol/L (ref 98–110)
CO2: 26 mmol/L (ref 20–31)
Creat: 1.78 mg/dL — ABNORMAL HIGH (ref 0.70–1.18)
GFR, EST AFRICAN AMERICAN: 44 mL/min — AB (ref >=60)
GFR, Est Non African American: 38 mL/min — ABNORMAL LOW (ref >=60)
Glucose, Bld: 109 mg/dL — ABNORMAL HIGH (ref 65–99)
POTASSIUM: 4.3 mmol/L (ref 3.5–5.3)
Sodium: 139 mmol/L (ref 135–146)
Total Bilirubin: 0.7 mg/dL (ref 0.2–1.2)
Total Protein: 5.8 g/dL — ABNORMAL LOW (ref 6.1–8.1)

## 2015-09-13 ENCOUNTER — Other Ambulatory Visit: Payer: Self-pay | Admitting: Internal Medicine

## 2015-09-21 ENCOUNTER — Encounter: Payer: Self-pay | Admitting: Internal Medicine

## 2015-09-21 ENCOUNTER — Other Ambulatory Visit (INDEPENDENT_AMBULATORY_CARE_PROVIDER_SITE_OTHER): Payer: Managed Care, Other (non HMO)

## 2015-09-21 ENCOUNTER — Ambulatory Visit (INDEPENDENT_AMBULATORY_CARE_PROVIDER_SITE_OTHER): Payer: Managed Care, Other (non HMO) | Admitting: Internal Medicine

## 2015-09-21 ENCOUNTER — Other Ambulatory Visit: Payer: Self-pay | Admitting: Internal Medicine

## 2015-09-21 VITALS — BP 120/72 | HR 82 | Ht 70.0 in | Wt 148.4 lb

## 2015-09-21 DIAGNOSIS — R7611 Nonspecific reaction to tuberculin skin test without active tuberculosis: Secondary | ICD-10-CM

## 2015-09-21 DIAGNOSIS — Z79899 Other long term (current) drug therapy: Secondary | ICD-10-CM | POA: Diagnosis not present

## 2015-09-21 DIAGNOSIS — K5100 Ulcerative (chronic) pancolitis without complications: Secondary | ICD-10-CM

## 2015-09-21 DIAGNOSIS — Z7960 Long term (current) use of unspecified immunomodulators and immunosuppressants: Secondary | ICD-10-CM

## 2015-09-21 DIAGNOSIS — Z227 Latent tuberculosis: Secondary | ICD-10-CM

## 2015-09-21 LAB — COMPREHENSIVE METABOLIC PANEL
ALBUMIN: 3 g/dL — AB (ref 3.5–5.2)
ALK PHOS: 60 U/L (ref 39–117)
ALT: 10 U/L (ref 0–53)
AST: 14 U/L (ref 0–37)
BUN: 30 mg/dL — AB (ref 6–23)
CO2: 28 mEq/L (ref 19–32)
CREATININE: 1.71 mg/dL — AB (ref 0.40–1.50)
Calcium: 9.1 mg/dL (ref 8.4–10.5)
Chloride: 105 mEq/L (ref 96–112)
GFR: 42.26 mL/min — ABNORMAL LOW (ref >60.00)
Glucose, Bld: 104 mg/dL — ABNORMAL HIGH (ref 70–99)
POTASSIUM: 4.4 meq/L (ref 3.5–5.1)
SODIUM: 139 meq/L (ref 135–145)
TOTAL PROTEIN: 6.3 g/dL (ref 6.0–8.3)
Total Bilirubin: 0.5 mg/dL (ref 0.2–1.2)

## 2015-09-21 LAB — CBC WITH DIFFERENTIAL/PLATELET
Basophils Absolute: 0 10*3/uL (ref 0.0–0.1)
Basophils Relative: 0.5 % (ref 0.0–3.0)
EOS PCT: 1.4 % (ref 0.0–5.0)
Eosinophils Absolute: 0.1 10*3/uL (ref 0.0–0.7)
HEMOGLOBIN: 9.1 g/dL — AB (ref 13.0–17.0)
LYMPHS PCT: 28.9 % (ref 12.0–46.0)
Lymphs Abs: 2 10*3/uL (ref 0.7–4.0)
MCHC: 33.8 g/dL (ref 30.0–36.0)
MCV: 107.2 fl — ABNORMAL HIGH (ref 78.0–100.0)
MONO ABS: 0.8 10*3/uL (ref 0.1–1.0)
Monocytes Relative: 11 % (ref 3.0–12.0)
Neutro Abs: 4 10*3/uL (ref 1.4–7.7)
Neutrophils Relative %: 58.2 % (ref 43.0–77.0)
Platelets: 482 10*3/uL — ABNORMAL HIGH (ref 150.0–400.0)
RDW: 20.4 % — ABNORMAL HIGH (ref 11.5–15.5)
WBC: 6.8 10*3/uL (ref 4.0–10.5)

## 2015-09-21 NOTE — Assessment & Plan Note (Addendum)
Ok on 6 MP alone off Humira - does not want to resume Humira if possible CBC, CMET today rescope later this yr after TB Tx done

## 2015-09-21 NOTE — Progress Notes (Signed)
   Subjective:    Patient ID: Sean Pope, male    DOB: 1946/04/14, 70 y.o.   MRN: [id number] Cc: f/u ulcerative colitis HPI  Sean Pope is here for follow-up doing well. He takes 1 Lomotil a day to keep his diarrhea and check. He notices there is gas buildup in the afternoon and sometimes he is not sure whether he is having gas or whether he has to stool and this is a bit of an inconvenience but he makes it to the bathroom. He often avoids eating lunch so he doesn't have problems in the afternoon when his handling a funeral etc. His weight has never returned to where was pre-diagnosis but it is stable. He seems to be tolerating his TB therapy. He is off Humira, remains on 6 MP and has no colon symptoms at this time other than that mentioned above. He really is the same as he was when he was on Humira. He would like not to resume Humira if possible.   Review of Systems As above    Objective:   Physical Exam @BP  120/72 mmHg  Pulse 82  Ht 5' 10"  (1.778 m)  Wt 148 lb 6.4 oz (67.314 kg)  BMI 21.29 kg/m2@  General:  NAD Eyes:   anicteric Lungs:  clear Heart::  S1S2 no rubs, murmurs or gallops Abdomen:  soft and nontender, BS+ Ext:   no edema, cyanosis or clubbing Skin:   a mild papular rash on his trunk he says comes and goes but does not bother him and is monitored by his dermatologist it predates his TB therapy   Data Reviewed:  As above Creatinine 1.68 and 1.78 in January and February. Liver chemistries okay, other metabolic studies normal.    Assessment & Plan:   1. Ulcerative chronic pancolitis, without complications ([place])   2. Long-term use of immunosuppressant medication   3. TB lung, latent    He seems to be doing well at this time. We do not have to resume Humira. He should have a colonoscopy later this year after he completes TB therapy for evaluation of mucosal healing. I will see him back in about 3 or 4 months. Labs today were checked, his creatinine is stable at 1.71, his  LFTs are normal, his hemoglobin is 9.1 with an MCV of 107 which reflects his 6 MP use I believe. White count is normal.  That hemoglobin level is slightly down. 10.3 in December. He is on ferrous sulfate. Believe he has gotten erythropoietin through renal in the past. He sees Sean Pope soon. I'll copy Sean Pope.  Note I see that he is on fenoprofen daily accident and sedated could flare his UC, also make his kidney function worse. We will call him about this to clarify. He could need a PPI if he stays on this as well.  CC: MILLER, Serina Cowper, MD Sean Pope.D.

## 2015-09-21 NOTE — Patient Instructions (Addendum)
Your physician has requested that you go to the basement for the following lab work before leaving today.  Please follow-up in 3 months, I'll call you to set up an appointment.  Thank you for choosing me and [facility].  Gatha Mayer, M.D., [facility]

## 2015-09-21 NOTE — Progress Notes (Signed)
Quick Note:  Labs overall stable regarding kidney function. His hemoglobin is a little bit lower, Dr. Posey Pronto can follow-up on this also. Please ask him about the fenoprofen use. I noticed that on his med list after he left. That can cause to to 3 problems in him, make his kidney function worse, cause ulcers and flare his colitis. Ask if he is taking that every day or when necessary are not all at this point and I will get back to him. It would be best if he can avoid that medication. ______

## 2015-09-23 NOTE — Telephone Encounter (Signed)
Dr. Baxter Flattery do you want the pt to continue on this medication, Rifapentine 150 MG TABS?  His return appointment with Dr. Baxter Flattery is 10/13/15.

## 2015-09-27 ENCOUNTER — Telehealth: Payer: Self-pay | Admitting: Internal Medicine

## 2015-09-27 NOTE — Telephone Encounter (Signed)
Patient left a vm on the medication assistance line yesterday (4/10) checking the status of a refill request for priftin, needs to know if the doctor wants him to continue it?

## 2015-09-28 NOTE — Telephone Encounter (Signed)
Looks like he is done.  OK to wait until Dr Baxter Flattery returns to confirm.

## 2015-09-28 NOTE — Telephone Encounter (Signed)
Left message with patient's wife that refills will be discussed at appointment 4/27. Landis Gandy, RN

## 2015-09-30 ENCOUNTER — Other Ambulatory Visit: Payer: Self-pay | Admitting: Internal Medicine

## 2015-10-03 NOTE — Telephone Encounter (Signed)
He is only supposed to have 12 weekly doses of meds ( i believe i sent out the correct number) so no refills.

## 2015-10-13 ENCOUNTER — Encounter: Payer: Self-pay | Admitting: Internal Medicine

## 2015-10-13 ENCOUNTER — Ambulatory Visit (INDEPENDENT_AMBULATORY_CARE_PROVIDER_SITE_OTHER): Payer: Managed Care, Other (non HMO) | Admitting: Internal Medicine

## 2015-10-13 VITALS — BP 124/78 | HR 83 | Temp 97.8°F | Ht 70.0 in | Wt 146.5 lb

## 2015-10-13 DIAGNOSIS — R7611 Nonspecific reaction to tuberculin skin test without active tuberculosis: Secondary | ICD-10-CM | POA: Diagnosis not present

## 2015-10-13 DIAGNOSIS — Z227 Latent tuberculosis: Secondary | ICD-10-CM

## 2015-10-13 LAB — COMPREHENSIVE METABOLIC PANEL
ALT: 13 U/L (ref 9–46)
AST: 16 U/L (ref 10–35)
Albumin: 2.9 g/dL — ABNORMAL LOW (ref 3.6–5.1)
Alkaline Phosphatase: 59 U/L (ref 40–115)
BUN: 24 mg/dL (ref 7–25)
CHLORIDE: 104 mmol/L (ref 98–110)
CO2: 24 mmol/L (ref 20–31)
Calcium: 8.6 mg/dL (ref 8.6–10.3)
Creat: 1.61 mg/dL — ABNORMAL HIGH (ref 0.70–1.18)
GLUCOSE: 100 mg/dL — AB (ref 65–99)
POTASSIUM: 4.6 mmol/L (ref 3.5–5.3)
Sodium: 138 mmol/L (ref 135–146)
Total Bilirubin: 0.5 mg/dL (ref 0.2–1.2)
Total Protein: 5.5 g/dL — ABNORMAL LOW (ref 6.1–8.1)

## 2015-10-13 NOTE — Progress Notes (Signed)
RFV: follow up for ltbi treatment Subjective:    Patient ID: Sean Pope, male    DOB: [birth year], 70 y.o.   MRN: [id number]  HPI  70yo M with history of Ulcerative Colitis, CKD, who was being evaluated for tx with humira. HIs work up revealed + QTF thus referred for tx of LTBI with 12 weekly. Last Friday was his last dose. Urine was orange. Felt unwell from Friday night to Saturday daytime, predominantly with nausea but no vomiting. Then symptoms spontaneously resolve. Now back to his baseline health. He denies any worsening of GI symptoms. Presently feels good since he has completed his tx course.  No Known Allergies Current Outpatient Prescriptions on File Prior to Visit  Medication Sig Dispense Refill  . Cholecalciferol (VITAMIN D3) 1000 units CAPS Take 1 capsule by mouth daily.    . diphenoxylate-atropine (LOMOTIL) 2.5-0.025 MG tablet TAKE 1 TO 2 TABLETS BY MOUTH EVERY 4 HOURS AS NEEDED FOR DIARRHEA 120 tablet 0  . Ferrous Sulfate (IRON) 325 (65 FE) MG TABS Take 1 tablet by mouth 2 (two) times daily.     . fluticasone (CUTIVATE) 0.05 % cream APPLY TO AFFECTED AREA TWICE A DAY AS NEEDED  2  . furosemide (LASIX) 40 MG tablet Take 2 tablets (80 mg total) by mouth daily. (Patient taking differently: Take 40 mg by mouth daily. ) 30 tablet 0  . mercaptopurine (PURINETHOL) 50 MG tablet TAKE 1 AND 1/2 TABLET BY MOUTH EVERY DAY ON AN EMPTY STOMACH 1 HOURS BEFORE OR 2 HOURS AFTER MEALS 45 tablet 2  . Multiple Vitamins-Minerals (MENS MULTIVITAMIN PLUS PO) Take 1 tablet by mouth.    . simvastatin (ZOCOR) 40 MG tablet Take 40 mg by mouth every evening.    . fenoprofen (NALFON) 600 MG TABS tablet Take 600 mg by mouth daily. Reported on 10/13/2015     No current facility-administered medications on file prior to visit.   Active Ambulatory Problems    Diagnosis Date Noted  . Ulcerative chronic pancolitis ([place]) 05/28/2013  . Chronic kidney disease, stage III (moderate) 09/08/2013  . GERD  (gastroesophageal reflux disease) 10/26/2013  . Long-term use of immunosuppressant medication-mercaptopurine and Humira 01/05/2014  . Anemia-multifactorial 01/05/2014  . Pulmonary infiltrates 01/19/2014  . COPD with emphysema  GOLD III 01/20/2014  . Anal fissure - posterior 01/22/2014  . History of systemic steroid therapy 06/02/2014  . Vitamin D deficiency 06/03/2014  . Osteoporosis 06/29/2014  . B12 deficiency - borderline 12/21/2014   Resolved Ambulatory Problems    Diagnosis Date Noted  . Subarachnoid hemorrhage ([place]) 09/17/2011  . Acute respiratory failure with hypoxia ([place]) 09/17/2011  . 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 Altered mental status 09/18/2011  . Lung collapse 09/26/2011  . Diarrhea 04/09/2013  . Abnormal CT scan, colon 04/09/2013  . Heartburn 04/09/2013  . Anemia 04/09/2013  . Orthostatic hypotension 09/08/2013  . Diarrhea 10/26/2013  . Protein-calorie malnutrition, severe ([place]) 10/26/2013  . Hypercalcemia 10/26/2013  . Edema 01/05/2014  . Hypoxia 01/07/2014  . Respiratory failure, acute ([place]) 01/07/2014  . PNA (pneumonia) 01/07/2014  . Leg swelling 01/19/2014  . C. difficile colitis 06/02/2014   Past Medical History  Diagnosis Date  . Stroke ([place])   . SAH (subarachnoid hemorrhage) ([place])   . Hypertension   . Hyperlipidemia   . Ulcerative colitis ([place]) 05/28/2013  . AKI (acute kidney injury) ([place])      Review of Systems Review of Systems  Constitutional: Negative for fever, chills, diaphoresis, activity change, appetite change,  fatigue and unexpected weight change.  HENT: Negative for congestion, sore throat, rhinorrhea, sneezing, trouble swallowing and sinus pressure.  Eyes: Negative for photophobia and visual disturbance.  Respiratory: Negative for cough, chest tightness, shortness of breath, wheezing and stridor.  Cardiovascular: Negative for chest pain, palpitations and leg swelling.  Gastrointestinal: Negative for nausea,  vomiting, abdominal pain, diarrhea, constipation, blood in stool, abdominal distention and anal bleeding.  Genitourinary: Negative for dysuria, hematuria, flank pain and difficulty urinating.  Musculoskeletal: Negative for myalgias, back pain, joint swelling, arthralgias and gait problem.  Skin: Negative for color change, pallor, rash and wound.  Neurological: Negative for dizziness, tremors, weakness and light-headedness.  Hematological: Negative for adenopathy. Does not bruise/bleed easily.  Psychiatric/Behavioral: Negative for behavioral problems, confusion, sleep disturbance, dysphoric mood, decreased concentration and agitation.       Objective:   Physical Exam BP 124/78 mmHg  Pulse 83  Temp(Src) 97.8 F (36.6 C) (Oral)  Ht 5' 10"  (1.778 m)  Wt 146 lb 8 oz (66.452 kg)  BMI 21.02 kg/m2 Physical Exam  Constitutional: He is oriented to person, place, and time. He appears well-developed and well-nourished. No distress.  HENT:  Mouth/Throat: Oropharynx is clear and moist. No oropharyngeal exudate.  Cardiovascular: Normal rate, regular rhythm and normal heart sounds. Exam reveals no gallop and no friction rub.  No murmur heard.  Pulmonary/Chest: Effort normal and breath sounds normal. No respiratory distress. He has no wheezes.  He has no cervical adenopathy.  Neurological: He is alert and oriented to person, place, and time.  Skin: Skin is warm and dry. No rash noted. No erythema.  Psychiatric: He has a normal mood and affect. His behavior is normal.       Assessment & Plan:  Latent TB infection = patient has successfully completed 12 weekly dose regimen. We will check  CMP to ensure now transaminitis. Now cleared to do biologics/ humira if needed for UC management  Cc: gessner

## 2015-10-19 ENCOUNTER — Other Ambulatory Visit (HOSPITAL_COMMUNITY): Payer: Self-pay | Admitting: *Deleted

## 2015-10-20 ENCOUNTER — Encounter (HOSPITAL_COMMUNITY)
Admission: RE | Admit: 2015-10-20 | Discharge: 2015-10-20 | Disposition: A | Discharge status: Home / Self Care | Payer: Medicare Other | Source: Ambulatory Visit | Attending: Nephrology | Admitting: Nephrology

## 2015-10-20 DIAGNOSIS — D638 Anemia in other chronic diseases classified elsewhere: Secondary | ICD-10-CM | POA: Diagnosis present

## 2015-10-20 DIAGNOSIS — N183 Chronic kidney disease, stage 3 (moderate): Secondary | ICD-10-CM | POA: Insufficient documentation

## 2015-10-20 LAB — POCT HEMOGLOBIN-HEMACUE: Hemoglobin: 8.8 g/dL — ABNORMAL LOW (ref 13.0–17.0)

## 2015-10-20 MED ORDER — EPOETIN ALFA 10000 UNIT/ML IJ SOLN
10000.0000 [IU] | INTRAMUSCULAR | Status: DC
  Administered 2015-10-20: 10000 [IU] via SUBCUTANEOUS

## 2015-10-20 MED ORDER — EPOETIN ALFA 10000 UNIT/ML IJ SOLN
INTRAMUSCULAR | Status: AC
  Filled 2015-10-20: qty 1

## 2015-10-22 ENCOUNTER — Other Ambulatory Visit: Payer: Self-pay | Admitting: Internal Medicine

## 2015-11-02 ENCOUNTER — Encounter (HOSPITAL_COMMUNITY)
Admission: RE | Admit: 2015-11-02 | Discharge: 2015-11-02 | Disposition: A | Discharge status: Home / Self Care | Payer: Medicare Other | Source: Ambulatory Visit | Attending: Nephrology | Admitting: Nephrology

## 2015-11-02 DIAGNOSIS — N183 Chronic kidney disease, stage 3 (moderate): Secondary | ICD-10-CM | POA: Diagnosis not present

## 2015-11-02 LAB — POCT HEMOGLOBIN-HEMACUE: Hemoglobin: 9.1 g/dL — ABNORMAL LOW (ref 13.0–17.0)

## 2015-11-02 MED ORDER — EPOETIN ALFA 10000 UNIT/ML IJ SOLN
INTRAMUSCULAR | Status: AC
  Administered 2015-11-02: 10000 [IU] via SUBCUTANEOUS
  Filled 2015-11-02: qty 1

## 2015-11-02 MED ORDER — EPOETIN ALFA 10000 UNIT/ML IJ SOLN
10000.0000 [IU] | INTRAMUSCULAR | Status: DC
  Administered 2015-11-02: 10000 [IU] via SUBCUTANEOUS

## 2015-11-16 ENCOUNTER — Encounter (HOSPITAL_COMMUNITY)
Admission: RE | Admit: 2015-11-16 | Discharge: 2015-11-16 | Disposition: A | Discharge status: Home / Self Care | Payer: Medicare Other | Source: Ambulatory Visit | Attending: Nephrology | Admitting: Nephrology

## 2015-11-16 DIAGNOSIS — N183 Chronic kidney disease, stage 3 (moderate): Secondary | ICD-10-CM | POA: Diagnosis not present

## 2015-11-16 LAB — POCT HEMOGLOBIN-HEMACUE: HEMOGLOBIN: 9.1 g/dL — AB (ref 13.0–17.0)

## 2015-11-16 MED ORDER — EPOETIN ALFA 10000 UNIT/ML IJ SOLN
10000.0000 [IU] | INTRAMUSCULAR | Status: DC
  Administered 2015-11-16: 10000 [IU] via SUBCUTANEOUS

## 2015-11-16 MED ORDER — EPOETIN ALFA 10000 UNIT/ML IJ SOLN
INTRAMUSCULAR | Status: AC
  Filled 2015-11-16: qty 1

## 2015-11-18 ENCOUNTER — Other Ambulatory Visit: Payer: Self-pay | Admitting: Internal Medicine

## 2015-11-30 ENCOUNTER — Encounter (HOSPITAL_COMMUNITY): Payer: Managed Care, Other (non HMO)

## 2015-12-01 ENCOUNTER — Encounter (HOSPITAL_COMMUNITY)
Admission: RE | Admit: 2015-12-01 | Discharge: 2015-12-01 | Disposition: A | Discharge status: Home / Self Care | Payer: Managed Care, Other (non HMO) | Source: Ambulatory Visit | Attending: Nephrology | Admitting: Nephrology

## 2015-12-01 DIAGNOSIS — N183 Chronic kidney disease, stage 3 (moderate): Secondary | ICD-10-CM | POA: Diagnosis present

## 2015-12-01 DIAGNOSIS — D638 Anemia in other chronic diseases classified elsewhere: Secondary | ICD-10-CM | POA: Insufficient documentation

## 2015-12-01 LAB — IRON AND TIBC
Iron: 46 ug/dL (ref 45–182)
Saturation Ratios: 32 % (ref 17.9–39.5)
TIBC: 143 ug/dL — ABNORMAL LOW (ref 250–450)
UIBC: 97 ug/dL

## 2015-12-01 LAB — FERRITIN: FERRITIN: 348 ng/mL — AB (ref 24–336)

## 2015-12-01 LAB — POCT HEMOGLOBIN-HEMACUE: HEMOGLOBIN: 9.2 g/dL — AB (ref 13.0–17.0)

## 2015-12-01 MED ORDER — EPOETIN ALFA 10000 UNIT/ML IJ SOLN
10000.0000 [IU] | INTRAMUSCULAR | Status: DC
  Administered 2015-12-01: 10000 [IU] via SUBCUTANEOUS

## 2015-12-01 MED ORDER — EPOETIN ALFA 10000 UNIT/ML IJ SOLN
INTRAMUSCULAR | Status: AC
  Filled 2015-12-01: qty 1

## 2015-12-07 ENCOUNTER — Inpatient Hospital Stay (HOSPITAL_COMMUNITY): Admission: RE | Admit: 2015-12-07 | Payer: Managed Care, Other (non HMO) | Source: Ambulatory Visit

## 2015-12-14 ENCOUNTER — Encounter (HOSPITAL_COMMUNITY)
Admission: RE | Admit: 2015-12-14 | Discharge: 2015-12-14 | Disposition: A | Discharge status: Home / Self Care | Payer: Managed Care, Other (non HMO) | Source: Ambulatory Visit | Attending: Nephrology | Admitting: Nephrology

## 2015-12-14 DIAGNOSIS — N183 Chronic kidney disease, stage 3 (moderate): Secondary | ICD-10-CM | POA: Diagnosis not present

## 2015-12-14 LAB — POCT HEMOGLOBIN-HEMACUE: Hemoglobin: 9.1 g/dL — ABNORMAL LOW (ref 13.0–17.0)

## 2015-12-14 MED ORDER — EPOETIN ALFA 10000 UNIT/ML IJ SOLN
10000.0000 [IU] | INTRAMUSCULAR | Status: DC
  Administered 2015-12-14: 10000 [IU] via SUBCUTANEOUS

## 2015-12-14 MED ORDER — EPOETIN ALFA 10000 UNIT/ML IJ SOLN
INTRAMUSCULAR | Status: AC
  Filled 2015-12-14: qty 1

## 2015-12-26 IMAGING — CR DG CHEST 2V
2 series · 2 of 2 positions shown · non-contrast
Comparison: Prior radiograph from 10/26/2013

CLINICAL DATA: SHORTNESS OF BREATH

EXAM:
CHEST  2 VIEW

[w chest pa]
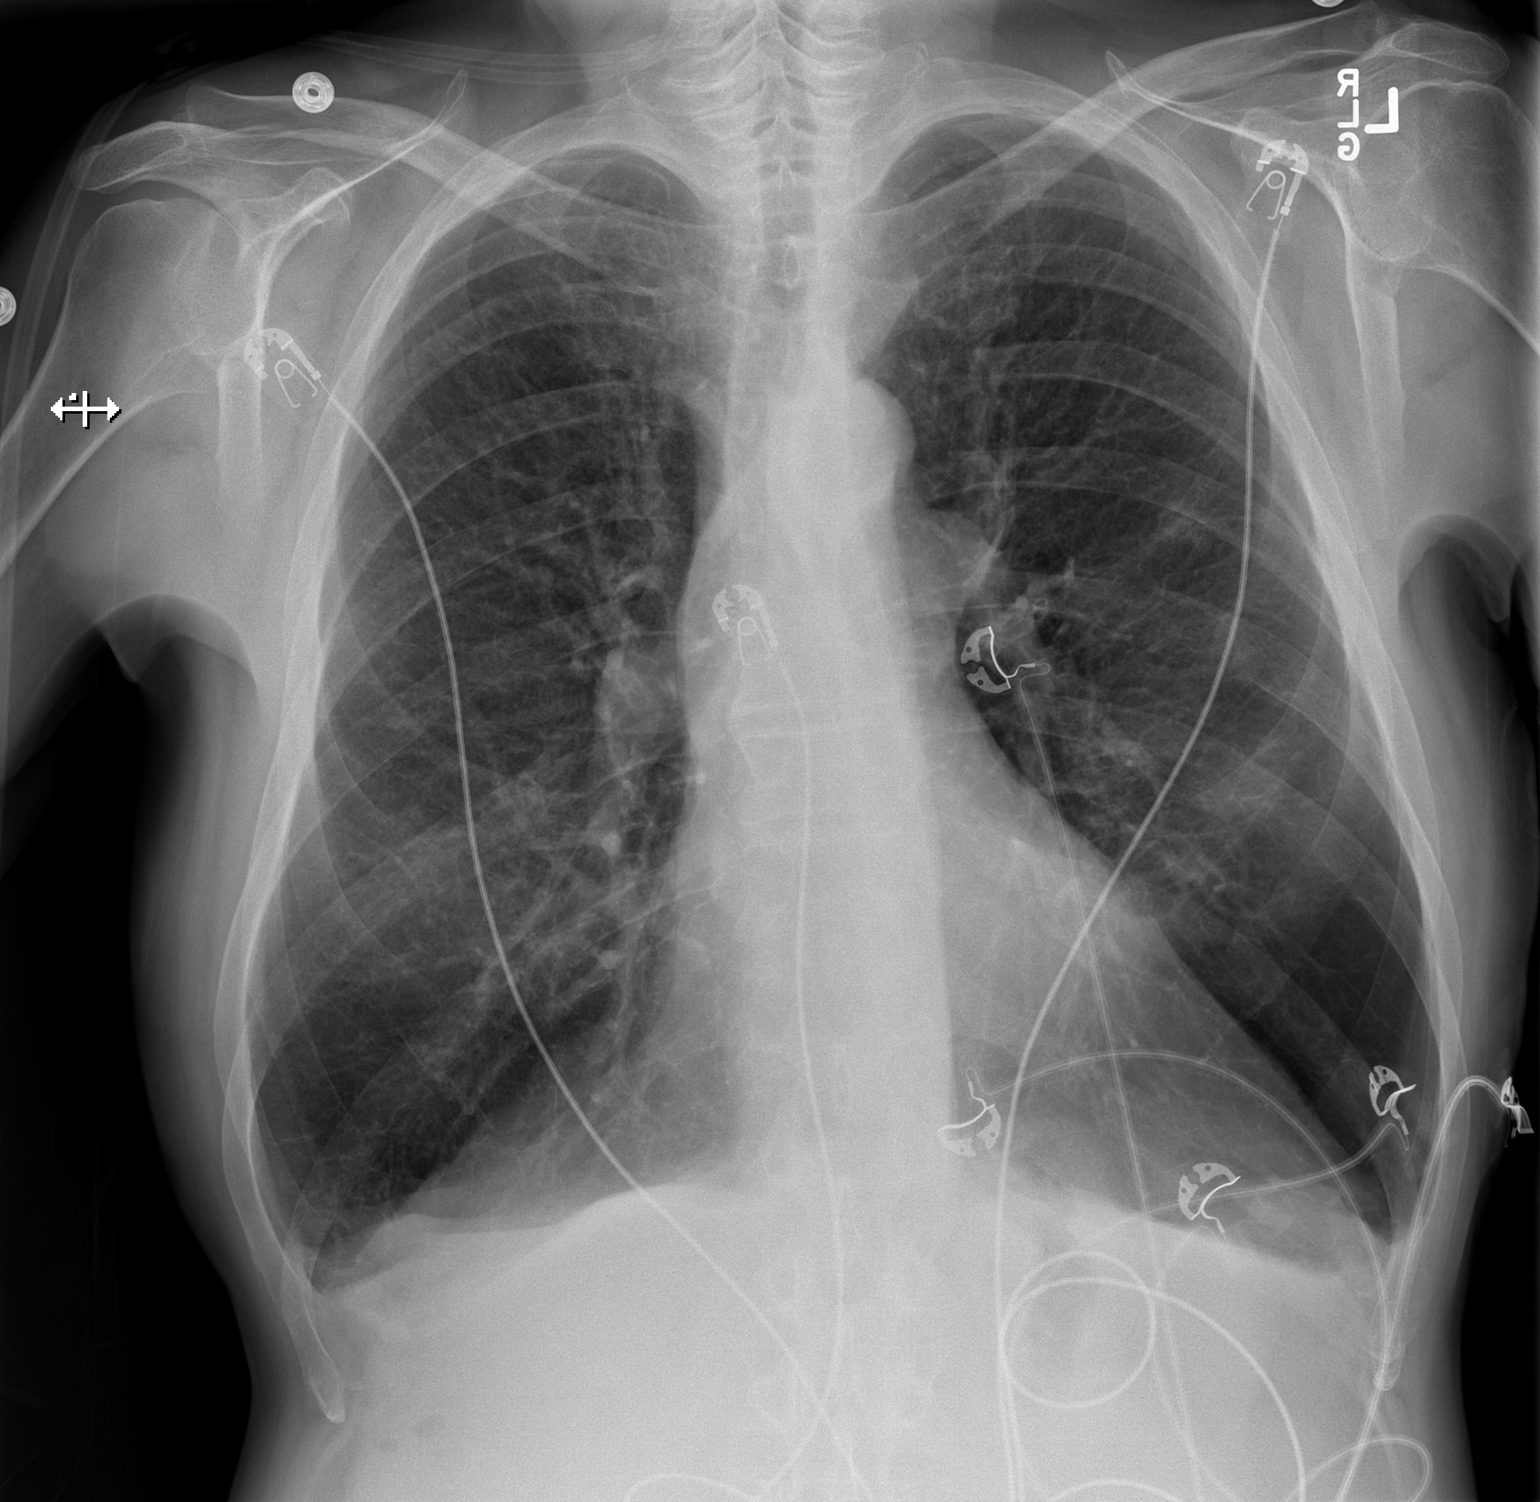

[w chest lat]
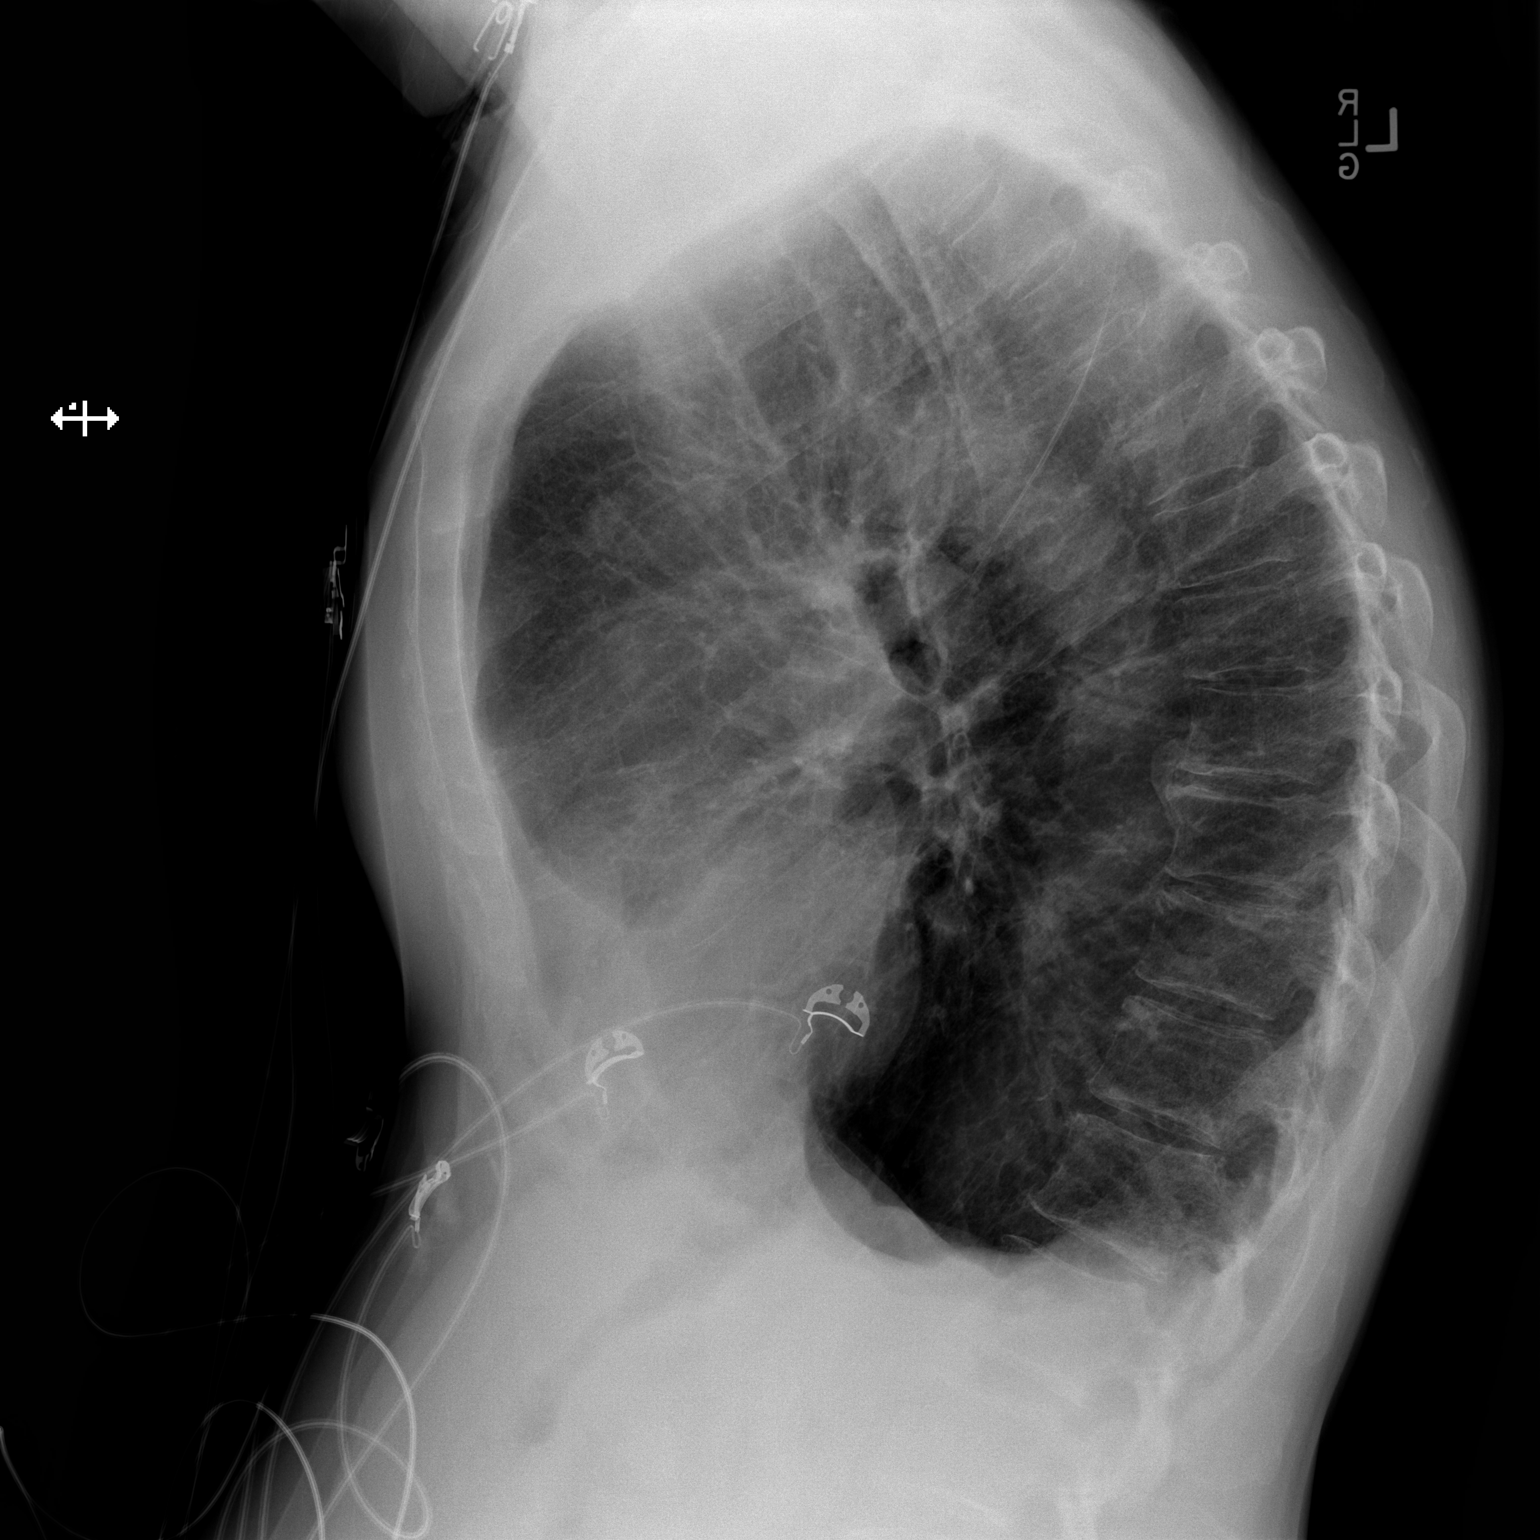

[2 of 2 positions shown; findings below may reference images not displayed]

FINDINGS: The cardiac and mediastinal silhouettes are stable in size and
contour, and remain within normal limits.

The lungs are normal hyperinflated with attenuation of the pulmonary
markings, greatest within the lung bases. Findings are consistent
with emphysema. No airspace consolidation, pleural effusion, or
pulmonary edema is identified. There is no pneumothorax.

There is mild accentuation of the normal thoracic kyphosis with
multilevel degenerative changes seen within the visualized spine. No
acute osseus abnormality.
IMPRESSION: 1. No active cardiopulmonary disease.
2. Emphysema.

## 2015-12-27 ENCOUNTER — Ambulatory Visit: Payer: Managed Care, Other (non HMO) | Admitting: Internal Medicine

## 2015-12-28 ENCOUNTER — Encounter (HOSPITAL_COMMUNITY)
Admission: RE | Admit: 2015-12-28 | Discharge: 2015-12-28 | Disposition: A | Discharge status: Home / Self Care | Payer: Managed Care, Other (non HMO) | Source: Ambulatory Visit | Attending: Nephrology | Admitting: Nephrology

## 2015-12-28 DIAGNOSIS — N183 Chronic kidney disease, stage 3 (moderate): Secondary | ICD-10-CM | POA: Diagnosis present

## 2015-12-28 DIAGNOSIS — D638 Anemia in other chronic diseases classified elsewhere: Secondary | ICD-10-CM | POA: Insufficient documentation

## 2015-12-28 LAB — IRON AND TIBC
IRON: 37 ug/dL — AB (ref 45–182)
SATURATION RATIOS: 23 % (ref 17.9–39.5)
TIBC: 160 ug/dL — AB (ref 250–450)
UIBC: 123 ug/dL

## 2015-12-28 LAB — FERRITIN: Ferritin: 273 ng/mL (ref 24–336)

## 2015-12-28 LAB — POCT HEMOGLOBIN-HEMACUE: Hemoglobin: 9.3 g/dL — ABNORMAL LOW (ref 13.0–17.0)

## 2015-12-28 MED ORDER — EPOETIN ALFA 10000 UNIT/ML IJ SOLN
INTRAMUSCULAR | Status: AC
  Filled 2015-12-28: qty 1

## 2015-12-28 MED ORDER — EPOETIN ALFA 10000 UNIT/ML IJ SOLN
10000.0000 [IU] | INTRAMUSCULAR | Status: DC
  Administered 2015-12-28: 10000 [IU] via SUBCUTANEOUS

## 2016-01-11 ENCOUNTER — Encounter (HOSPITAL_COMMUNITY): Payer: Managed Care, Other (non HMO)

## 2016-01-12 ENCOUNTER — Encounter (HOSPITAL_COMMUNITY)
Admission: RE | Admit: 2016-01-12 | Discharge: 2016-01-12 | Disposition: A | Discharge status: Home / Self Care | Payer: Managed Care, Other (non HMO) | Source: Ambulatory Visit | Attending: Nephrology | Admitting: Nephrology

## 2016-01-12 DIAGNOSIS — N183 Chronic kidney disease, stage 3 unspecified: Secondary | ICD-10-CM

## 2016-01-12 LAB — POCT HEMOGLOBIN-HEMACUE: HEMOGLOBIN: 9.7 g/dL — AB (ref 13.0–17.0)

## 2016-01-12 MED ORDER — EPOETIN ALFA 20000 UNIT/ML IJ SOLN
20000.0000 [IU] | INTRAMUSCULAR | Status: DC
  Administered 2016-01-12: 20000 [IU] via SUBCUTANEOUS

## 2016-01-12 MED ORDER — EPOETIN ALFA 20000 UNIT/ML IJ SOLN
INTRAMUSCULAR | Status: AC
  Filled 2016-01-12: qty 1

## 2016-01-20 ENCOUNTER — Other Ambulatory Visit: Payer: Self-pay | Admitting: Internal Medicine

## 2016-01-26 ENCOUNTER — Encounter (HOSPITAL_COMMUNITY)
Admission: RE | Admit: 2016-01-26 | Discharge: 2016-01-26 | Disposition: A | Discharge status: Home / Self Care | Payer: Managed Care, Other (non HMO) | Source: Ambulatory Visit | Attending: Nephrology | Admitting: Nephrology

## 2016-01-26 DIAGNOSIS — D638 Anemia in other chronic diseases classified elsewhere: Secondary | ICD-10-CM | POA: Insufficient documentation

## 2016-01-26 DIAGNOSIS — N183 Chronic kidney disease, stage 3 unspecified: Secondary | ICD-10-CM

## 2016-01-26 LAB — CBC
HCT: 29.3 % — ABNORMAL LOW (ref 39.0–52.0)
HEMOGLOBIN: 9.2 g/dL — AB (ref 13.0–17.0)
MCH: 34.3 pg — AB (ref 26.0–34.0)
MCHC: 31.4 g/dL (ref 30.0–36.0)
MCV: 109.3 fL — ABNORMAL HIGH (ref 78.0–100.0)
Platelets: 379 10*3/uL (ref 150–400)
RBC: 2.68 MIL/uL — ABNORMAL LOW (ref 4.22–5.81)
RDW: 17.8 % — AB (ref 11.5–15.5)
WBC: 6.5 10*3/uL (ref 4.0–10.5)

## 2016-01-26 LAB — RENAL FUNCTION PANEL
ALBUMIN: 2.7 g/dL — AB (ref 3.5–5.0)
ANION GAP: 7 (ref 5–15)
BUN: 29 mg/dL — ABNORMAL HIGH (ref 6–20)
CALCIUM: 8.7 mg/dL — AB (ref 8.9–10.3)
CO2: 26 mmol/L (ref 22–32)
CREATININE: 1.79 mg/dL — AB (ref 0.61–1.24)
Chloride: 105 mmol/L (ref 101–111)
GFR, EST AFRICAN AMERICAN: 43 mL/min — AB (ref >60)
GFR, EST NON AFRICAN AMERICAN: 37 mL/min — AB (ref >60)
Glucose, Bld: 105 mg/dL — ABNORMAL HIGH (ref 65–99)
PHOSPHORUS: 3.5 mg/dL (ref 2.5–4.6)
Potassium: 4.1 mmol/L (ref 3.5–5.1)
SODIUM: 138 mmol/L (ref 135–145)

## 2016-01-26 LAB — MAGNESIUM: MAGNESIUM: 2 mg/dL (ref 1.7–2.4)

## 2016-01-26 LAB — IRON AND TIBC
IRON: 31 ug/dL — AB (ref 45–182)
SATURATION RATIOS: 21 % (ref 17.9–39.5)
TIBC: 147 ug/dL — AB (ref 250–450)
UIBC: 116 ug/dL

## 2016-01-26 LAB — FERRITIN: Ferritin: 288 ng/mL (ref 24–336)

## 2016-01-26 MED ORDER — EPOETIN ALFA 20000 UNIT/ML IJ SOLN
INTRAMUSCULAR | Status: AC
  Filled 2016-01-26: qty 1

## 2016-01-26 MED ORDER — EPOETIN ALFA 20000 UNIT/ML IJ SOLN
20000.0000 [IU] | INTRAMUSCULAR | Status: DC
  Administered 2016-01-26: 20000 [IU] via SUBCUTANEOUS

## 2016-01-27 LAB — VITAMIN D 25 HYDROXY (VIT D DEFICIENCY, FRACTURES): VIT D 25 HYDROXY: 30 ng/mL (ref 30.0–100.0)

## 2016-01-27 LAB — PTH, INTACT AND CALCIUM
Calcium, Total (PTH): 8.6 mg/dL (ref 8.6–10.2)
PTH: 43 pg/mL (ref 15–65)

## 2016-02-09 ENCOUNTER — Encounter (HOSPITAL_COMMUNITY): Payer: Managed Care, Other (non HMO)

## 2016-03-09 ENCOUNTER — Other Ambulatory Visit: Payer: Self-pay

## 2016-03-09 ENCOUNTER — Other Ambulatory Visit: Payer: Self-pay | Admitting: Internal Medicine

## 2016-03-09 MED ORDER — DIPHENOXYLATE-ATROPINE 2.5-0.025 MG PO TABS: 2.0000 | ORAL_TABLET | Freq: Four times a day (QID) | ORAL | 0 refills | Status: DC | PRN

## 2016-03-09 NOTE — Telephone Encounter (Signed)
Pt has an office visit for 04-06-2016. Can we fill his Lomotil ?

## 2016-03-14 ENCOUNTER — Other Ambulatory Visit: Payer: Self-pay

## 2016-03-14 MED ORDER — DIPHENOXYLATE-ATROPINE 2.5-0.025 MG PO TABS: 2.0000 | ORAL_TABLET | Freq: Four times a day (QID) | ORAL | 6 refills | Status: DC | PRN

## 2016-03-14 NOTE — Telephone Encounter (Signed)
Refilled as directed by Dr Carlean Purl.

## 2016-03-14 NOTE — Telephone Encounter (Signed)
Ok to refill x 6  

## 2016-04-06 ENCOUNTER — Encounter: Payer: Self-pay | Admitting: Internal Medicine

## 2016-04-06 ENCOUNTER — Ambulatory Visit (INDEPENDENT_AMBULATORY_CARE_PROVIDER_SITE_OTHER): Payer: Managed Care, Other (non HMO) | Admitting: Internal Medicine

## 2016-04-06 ENCOUNTER — Other Ambulatory Visit (INDEPENDENT_AMBULATORY_CARE_PROVIDER_SITE_OTHER): Payer: Managed Care, Other (non HMO)

## 2016-04-06 VITALS — BP 130/66 | HR 78 | Ht 70.0 in | Wt 143.0 lb

## 2016-04-06 DIAGNOSIS — K51018 Ulcerative (chronic) pancolitis with other complication: Secondary | ICD-10-CM | POA: Diagnosis not present

## 2016-04-06 DIAGNOSIS — E538 Deficiency of other specified B group vitamins: Secondary | ICD-10-CM

## 2016-04-06 DIAGNOSIS — Z79899 Other long term (current) drug therapy: Secondary | ICD-10-CM

## 2016-04-06 DIAGNOSIS — Z23 Encounter for immunization: Secondary | ICD-10-CM | POA: Diagnosis not present

## 2016-04-06 DIAGNOSIS — D518 Other vitamin B12 deficiency anemias: Secondary | ICD-10-CM

## 2016-04-06 DIAGNOSIS — Z7960 Long term (current) use of unspecified immunomodulators and immunosuppressants: Secondary | ICD-10-CM

## 2016-04-06 LAB — VITAMIN B12: Vitamin B-12: 321 pg/mL (ref 211–911)

## 2016-04-06 NOTE — Progress Notes (Signed)
B12 level is okay. Another possible cause of his anemia could be from the 6-MP and I have levels pending on that.

## 2016-04-06 NOTE — Progress Notes (Addendum)
Sean Pope 70 y.o. [birth year] [id number]  Assessment & Plan:   Ulcerative chronic pancolitis thiopurine metabolities Colonoscopy assessment sxic Lomotil  Long-term use of immunosuppressant medication-mercaptopurine and Humira Off Humira thiopurine metabolites now Await labs from Dr. Posey Pronto  B12 deficiency - borderline Not on Tx - suspect part of anemia problem Check level  Anemia-multifactorial B12 level   Lab Results  Component Value Date   VITAMINB12 321 04/06/2016  Another possible cause of his anemia would be from the 6-MP await the metabolite levels. Though that usually correlates with white count more than hemoglobin will see.  Flu shot was given today Cc;MILLER, Serina Cowper, MD Dr. Elmarie Shiley   Subjective:   Chief Complaint: ulcerative colitis follow-up  HPI Sean Pope is pleased with our doing overall however, he still has some diarrhea. controls diarrhea w/ 1 Lomotil/day but wears adult diapers. Getting some bad gas in the afternoons. Finished his treatment for latent TB, remains off Humira and would like to remain off Humira No Known Allergies Outpatient Medications Prior to Visit  Medication Sig Dispense Refill  . Cholecalciferol (VITAMIN D3) 1000 units CAPS Take 1 capsule by mouth daily.    . diphenoxylate-atropine (LOMOTIL) 2.5-0.025 MG tablet Take 2 tablets by mouth 4 (four) times daily as needed for diarrhea or loose stools. Take 1-2 tablets every 4 hours as needed for diarrhea 120 tablet 6  . fenoprofen (NALFON) 600 MG TABS tablet Take 600 mg by mouth daily. Reported on 10/13/2015    . Ferrous Sulfate (IRON) 325 (65 FE) MG TABS Take 1 tablet by mouth 2 (two) times daily.     . fluticasone (CUTIVATE) 0.05 % cream APPLY TO AFFECTED AREA TWICE A DAY AS NEEDED  2  . furosemide (LASIX) 40 MG tablet Take 2 tablets (80 mg total) by mouth daily. (Patient taking differently: Take 40 mg by mouth daily. ) 30 tablet 0  . mercaptopurine (PURINETHOL) 50 MG tablet  TAKE 1 AND 1/2 TABLET BY MOUTH EVERY DAY ON AN EMPTY STOMACH 1 HOURS BEFORE OR 2 HOURS AFTER MEALS 45 tablet 2  . Multiple Vitamins-Minerals (MENS MULTIVITAMIN PLUS PO) Take 1 tablet by mouth.    . simvastatin (ZOCOR) 40 MG tablet Take 40 mg by mouth every evening.     No facility-administered medications prior to visit.    Past Medical History:  Diagnosis Date  . Acute respiratory failure with hypoxia ([place]) 09/17/2011  . AKI (acute kidney injury) ([place])    In the setting of ulcerative colitis flare, question relationship to mesalamine  . Anal fissure - posterior 01/22/2014  . Anemia-multifactorial 01/05/2014   Blood loss, medications, chronic illness kidney injury  . B12 deficiency - borderline 12/21/2014  . C. difficile colitis 06/02/2014  . Chronic kidney disease, stage III (moderate) 09/08/2013  . Hyperlipidemia   . Hypertension   . Long-term use of immunosuppressant medication-mercaptopurine and Humira 01/05/2014  . Lung collapse 09/26/2011  . Osteoporosis 06/29/2014   DEXA 06/2014 - lowest t score -4.2  . 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 Protein-calorie malnutrition, severe ([place]) 10/26/2013  . Respiratory failure, acute ([place]) 01/07/2014  . SAH (subarachnoid hemorrhage) ([place])   . Stroke ([place])   . Ulcerative colitis ([place]) 05/28/2013  . Vitamin D deficiency 06/03/2014   Past Surgical History:  Procedure Laterality Date  . ANEURYSM COILING  2013   Posterior communicating  . CATARACT EXTRACTION Bilateral aug, sept 2016  . COLONOSCOPY W/ BIOPSIES  05/28/2013   Social History   Social History  .  Marital status: Married    Spouse name: N/A  . Number of children: 2  . Years of education: N/A   Occupational History  . Director     [employer]  .  Retired   Social History Main Topics  . Smoking status: Former Smoker    Packs/day: 0.50    Years: 50.00    Types: Cigarettes    Quit date: 06/19/2011  . Smokeless tobacco: Never Used  . Alcohol use 0.0 oz/week      Comment: rare  . Drug use: No  . Sexual activity: No   Other Topics Concern  . None   Social History Narrative   Married, 1 son and 1 daughter   TEFL teacher   1.5 caffeine drinks daily   Family History  Problem Relation Age of Onset  . Heart disease Mother     Review of Systems Fatigue, cannot gain weight, he sees Dr. Posey Pronto next week   Objective:   Physical Exam @BP  130/66   Pulse 78   Ht 5' 10"  (1.778 m)   Wt 143 lb (64.9 kg)   BMI 20.52 kg/m @  General:  NAD, thin Eyes:   anicteric Lungs:  clear Heart::  S1S2 no rubs, murmurs or gallops Abdomen:  soft and nontender, BS+ Ext:   no , cyanosis or clubbing    Data Reviewed:  2014 colonoscopy, labs in EMR from August 2017

## 2016-04-06 NOTE — Assessment & Plan Note (Signed)
Not on Tx - suspect part of anemia problem Check level

## 2016-04-06 NOTE — Assessment & Plan Note (Signed)
Off Humira thiopurine metabolites now Await labs from Dr. Posey Pronto

## 2016-04-06 NOTE — Assessment & Plan Note (Signed)
B12 level

## 2016-04-06 NOTE — Patient Instructions (Addendum)
  Today you have been given a flu vaccine and an information sheet to read about that vaccine.  Today we are giving you samples of VSL #3 to take one daily for gas.  Your physician has requested that you go to the basement for lab work before leaving today.   You have been scheduled for a colonoscopy. Please follow written instructions given to you at your visit today.  Please pick up your prep supplies at the pharmacy. If you use inhalers (even only as needed), please bring them with you on the day of your procedure. Your physician has requested that you go to www.startemmi.com and enter the access code given to you at your visit today. This web site gives a general overview about your procedure. However, you should still follow specific instructions given to you by our office regarding your preparation for the procedure.     I appreciate the opportunity to care for you. Silvano Rusk, MD, [facility]

## 2016-04-06 NOTE — Assessment & Plan Note (Signed)
thiopurine metabolities Colonoscopy assessment sxic Lomotil

## 2016-04-12 ENCOUNTER — Telehealth: Payer: Self-pay

## 2016-04-12 DIAGNOSIS — K5100 Ulcerative (chronic) pancolitis without complications: Secondary | ICD-10-CM

## 2016-04-12 NOTE — Telephone Encounter (Signed)
Lab wasn't future so I've reordered and [name] will come by soon and have it redrawn.

## 2016-04-12 NOTE — Telephone Encounter (Signed)
-----   Message from Gatha Mayer, MD sent at 04/12/2016  3:39 PM EDT ----- Regarding: Labs not done Please find out what is going on with the thiopurine metabolites that we ordered it looks like that was not collected.

## 2016-04-17 ENCOUNTER — Other Ambulatory Visit: Payer: Medicare Other

## 2016-04-17 DIAGNOSIS — K5100 Ulcerative (chronic) pancolitis without complications: Secondary | ICD-10-CM

## 2016-04-19 ENCOUNTER — Other Ambulatory Visit: Payer: Self-pay | Admitting: Internal Medicine

## 2016-04-22 LAB — SERIAL MONITORING

## 2016-04-23 LAB — THIOPURINE METABOLITES
6-MMPN Metaboilte: 5389 pmol/8x 10E8
6-TGN Metabolite: 721 pmol/8x 10E8

## 2016-04-23 NOTE — Progress Notes (Signed)
The test results suggest he is on too much 6 MP - usually would cause low WBC and not RBC but think possibly contributing to his anemia  Reduce 6 MP to 50 mg daily  Repeat these levels in 4-[redacted] weeks along with a CBC  Cc Drs. Kathyrn Lass and Elmarie Shiley

## 2016-04-24 ENCOUNTER — Other Ambulatory Visit: Payer: Self-pay

## 2016-04-24 DIAGNOSIS — Z79899 Other long term (current) drug therapy: Secondary | ICD-10-CM

## 2016-04-24 MED ORDER — MERCAPTOPURINE 50 MG PO TABS: 50.0000 mg | ORAL_TABLET | Freq: Every day | ORAL | 2 refills | Status: DC

## 2016-04-27 ENCOUNTER — Other Ambulatory Visit (HOSPITAL_COMMUNITY): Payer: Self-pay | Admitting: *Deleted

## 2016-04-30 ENCOUNTER — Encounter (HOSPITAL_COMMUNITY): Payer: Managed Care, Other (non HMO)

## 2016-05-23 ENCOUNTER — Other Ambulatory Visit: Payer: Self-pay | Admitting: Internal Medicine

## 2016-06-04 ENCOUNTER — Other Ambulatory Visit (INDEPENDENT_AMBULATORY_CARE_PROVIDER_SITE_OTHER): Payer: Managed Care, Other (non HMO)

## 2016-06-04 ENCOUNTER — Encounter: Payer: Self-pay | Admitting: Internal Medicine

## 2016-06-04 DIAGNOSIS — Z79899 Other long term (current) drug therapy: Secondary | ICD-10-CM

## 2016-06-04 LAB — CBC WITH DIFFERENTIAL/PLATELET
Basophils Absolute: 0.1 10*3/uL (ref 0.0–0.1)
Basophils Relative: 0.8 % (ref 0.0–3.0)
EOS PCT: 0.7 % (ref 0.0–5.0)
Eosinophils Absolute: 0.1 10*3/uL (ref 0.0–0.7)
HCT: 29 % — ABNORMAL LOW (ref 39.0–52.0)
HEMOGLOBIN: 9.8 g/dL — AB (ref 13.0–17.0)
Lymphocytes Relative: 29.5 % (ref 12.0–46.0)
Lymphs Abs: 2.5 10*3/uL (ref 0.7–4.0)
MCHC: 33.8 g/dL (ref 30.0–36.0)
MCV: 104.6 fl — AB (ref 78.0–100.0)
MONOS PCT: 9.5 % (ref 3.0–12.0)
Monocytes Absolute: 0.8 10*3/uL (ref 0.1–1.0)
Neutro Abs: 5.1 10*3/uL (ref 1.4–7.7)
Neutrophils Relative %: 59.5 % (ref 43.0–77.0)
Platelets: 405 10*3/uL — ABNORMAL HIGH (ref 150.0–400.0)
RBC: 2.77 Mil/uL — AB (ref 4.22–5.81)
RDW: 17.9 % — ABNORMAL HIGH (ref 11.5–15.5)
WBC: 8.6 10*3/uL (ref 4.0–10.5)

## 2016-06-07 ENCOUNTER — Telehealth: Payer: Self-pay | Admitting: Internal Medicine

## 2016-06-07 ENCOUNTER — Other Ambulatory Visit: Payer: Self-pay

## 2016-06-07 MED ORDER — PREPOPIK 10-3.5-12 MG-GM-GM PO PACK: PACK | ORAL | 0 refills | Status: DC

## 2016-06-07 NOTE — Telephone Encounter (Signed)
Needs his instructions and prescription for the prep for the colonoscopy. Faxed instructions as per his request to [phone]

## 2016-06-09 LAB — THIOPURINE METHYLTRANSFERASE (TPMT), RBC: THIOPURINE METHYLTRANSFERASE, RBC: 23 nmol/h/mL

## 2016-06-12 ENCOUNTER — Other Ambulatory Visit: Payer: Self-pay

## 2016-06-12 DIAGNOSIS — K5180 Other ulcerative colitis without complications: Secondary | ICD-10-CM

## 2016-06-12 NOTE — Progress Notes (Signed)
Hgb essentially stable He was supposed to get metabolites not enzyme activity - please see if this can be switched if not will need to rerun and we should cancel the charges for the wrong test

## 2016-06-14 ENCOUNTER — Ambulatory Visit (AMBULATORY_SURGERY_CENTER): Payer: Managed Care, Other (non HMO) | Admitting: Internal Medicine

## 2016-06-14 ENCOUNTER — Encounter: Payer: Self-pay | Admitting: Internal Medicine

## 2016-06-14 VITALS — BP 146/73 | HR 61 | Temp 95.0°F | Resp 13 | Ht 70.0 in | Wt 143.0 lb

## 2016-06-14 DIAGNOSIS — K51018 Ulcerative (chronic) pancolitis with other complication: Secondary | ICD-10-CM

## 2016-06-14 DIAGNOSIS — K529 Noninfective gastroenteritis and colitis, unspecified: Secondary | ICD-10-CM

## 2016-06-14 DIAGNOSIS — K5140 Inflammatory polyps of colon without complications: Secondary | ICD-10-CM

## 2016-06-14 MED ORDER — PREDNISONE 10 MG PO TABS: 40.0000 mg | ORAL_TABLET | Freq: Every day | ORAL | 1 refills | Status: DC

## 2016-06-14 MED ORDER — SODIUM CHLORIDE 0.9 % IV SOLN: 500.0000 mL | INTRAVENOUS | Status: DC

## 2016-06-14 NOTE — Patient Instructions (Addendum)
   There is active inflammation in the rectum and sigmoid colon. Also have inflammatory pseudopolyps throughout the colon.  I believe we need to change your treatment - will discuss.  Am thinking of adding a drug called Entyvio.  I appreciate the opportunity to care for you. Gatha Mayer, MD, FACG   YOU HAD AN ENDOSCOPIC PROCEDURE TODAY AT [facility]:   Refer to the procedure report that was given to you for any specific questions about what was found during the examination.  If the procedure report does not answer your questions, please call your gastroenterologist to clarify.  If you requested that your care partner not be given the details of your procedure findings, then the procedure report has been included in a sealed envelope for you to review at your convenience later.  YOU SHOULD EXPECT: Some feelings of bloating in the abdomen. Passage of more gas than usual.  Walking can help get rid of the air that was put into your GI tract during the procedure and reduce the bloating. If you had a lower endoscopy (such as a colonoscopy or flexible sigmoidoscopy) you may notice spotting of blood in your stool or on the toilet paper. If you underwent a bowel prep for your procedure, you may not have a normal bowel movement for a few days.  Please Note:  You might notice some irritation and congestion in your nose or some drainage.  This is from the oxygen used during your procedure.  There is no need for concern and it should clear up in a day or so.  SYMPTOMS TO REPORT IMMEDIATELY:   Following lower endoscopy (colonoscopy or flexible sigmoidoscopy):  Excessive amounts of blood in the stool  Significant tenderness or worsening of abdominal pains  Swelling of the abdomen that is new, acute  Fever of 100F or higher  For urgent or emergent issues, a gastroenterologist can be reached at any hour by calling [phone].   DIET:  We do recommend a small meal at  first, but then you may proceed to your regular diet.  Drink plenty of fluids but you should avoid alcoholic beverages for 24 hours.  ACTIVITY:  You should plan to take it easy for the rest of today and you should NOT DRIVE or use heavy machinery until tomorrow (because of the sedation medicines used during the test).    FOLLOW UP: Our staff will call the number listed on your records the next business day following your procedure to check on you and address any questions or concerns that you may have regarding the information given to you following your procedure. If we do not reach you, we will leave a message.  However, if you are feeling well and you are not experiencing any problems, there is no need to return our call.  We will assume that you have returned to your regular daily activities without incident.  If any biopsies were taken you will be contacted by phone or by letter within the next 1-3 weeks.  Please call us at [phone] if you have not heard about the biopsies in 3 weeks.    SIGNATURES/CONFIDENTIALITY: You and/or your care partner have signed paperwork which will be entered into your electronic medical record.  These signatures attest to the fact that that the information above on your After Visit Summary has been reviewed and is understood.  Full responsibility of the confidentiality of this discharge information lies with you and/or your care-partner.

## 2016-06-14 NOTE — Progress Notes (Signed)
Called to room to assist during endoscopic procedure.  Patient ID and intended procedure confirmed with present staff. Received instructions for my participation in the procedure from the performing physician.  

## 2016-06-14 NOTE — Op Note (Addendum)
[facility] Patient Name: Sean Pope Procedure Date: 06/14/2016 8:03 AM MRN: [id number] Endoscopist: Gatha Mayer , MD Age: 70 Referring MD:  Date of Birth: 01-11-1946 Gender: Male Account #: [id number] Procedure:                Colonoscopy Indications:              Chronic ulcerative pancolitis, Follow-up of chronic                            ulcerative pancolitis, Disease activity assessment                            of chronic ulcerative pancolitis, Assess                            therapeutic response to therapy of chronic                            ulcerative pancolitis Medicines:                Propofol per Anesthesia, Monitored Anesthesia Care Procedure:                Pre-Anesthesia Assessment:                           - Prior to the procedure, a History and Physical                            was performed, and patient medications and                            allergies were reviewed. The patient's tolerance of                            previous anesthesia was also reviewed. The risks                            and benefits of the procedure and the sedation                            options and risks were discussed with the patient.                            All questions were answered, and informed consent                            was obtained. Prior Anticoagulants: The patient has                            taken no previous anticoagulant or antiplatelet                            agents. ASA Grade Assessment: II - A patient with  mild systemic disease. After reviewing the risks                            and benefits, the patient was deemed in                            satisfactory condition to undergo the procedure.                           After obtaining informed consent, the colonoscope                            was passed under direct vision. Throughout the                            procedure, the patient's blood  pressure, pulse, and                            oxygen saturations were monitored continuously. The                            Model CF-HQ190L [id number]) scope was introduced                            through the anus and advanced to the the terminal                            ileum, with identification of the appendiceal                            orifice and IC valve. The colonoscopy was performed                            without difficulty. The patient tolerated the                            procedure well. The quality of the bowel                            preparation was good. The terminal ileum, ileocecal                            valve, appendiceal orifice, and rectum were                            photographed. The bowel preparation used was                            Prepopik. Scope In: 8:09:05 AM Scope Out: 8:22:10 AM Scope Withdrawal Time: 0 hours 10 minutes 30 seconds  Total Procedure Duration: 0 hours 13 minutes 5 seconds  Findings:                 The perianal and digital rectal examinations were  normal. Pertinent negatives include normal prostate                            (size, shape, and consistency).                           Diffuse pseudopolyps were found in the entire                            colon. This was biopsied with a cold forceps for                            histology. Verification of patient identification                            for the specimen was done. Estimated blood loss was                            minimal.                           Inflammation characterized by erosions, erythema,                            friability, granularity, loss of vascularity, mucus                            and pseudopolyps was found in a continuous and                            circumferential pattern from the anus to the                            sigmoid colon. This was moderate in severity.                             Biopsies were taken with a cold forceps for                            histology. Verification of patient identification                            for the specimen was done. Estimated blood loss was                            minimal.                           A few small-mouthed diverticula were found in the                            sigmoid colon and descending colon.                           The exam was otherwise without abnormality.  Biopsies for histology were taken with a cold                            forceps from the descending colon for evaluation of                            microscopic colitis. Estimated blood loss was                            minimal. Complications:            No immediate complications. Estimated Blood Loss:     Estimated blood loss was minimal. Impression:               - Pseudopolyps in the entire examined colon.                            Resection not attempted. Biopsied.                           - Ulcerative colitis. Inflammation was found from                            the anus to the sigmoid colon. This was moderate in                            severity. Biopsied.                           - Diverticulosis in the sigmoid colon and in the                            descending colon.                           - The examination was otherwise normal.                           - Biopsies were taken with a cold forceps from the                            descending colon for evaluation of microscopic                            colitis. Recommendation:           - Patient has a contact number available for                            emergencies. The signs and symptoms of potential                            delayed complications were discussed with the                            patient. Return to normal activities tomorrow.  Written discharge instructions were provided to the                             patient.                           - Resume previous diet.                           - Continue present medications.                           - Repeat colonoscopy is recommended. The                            colonoscopy date will be determined after pathology                            results from today's exam become available for                            review.                           -                           DISEASE NOT UNDER CONTROL                           THINK HE SHOULD ADD BACK A BIOLOGIC - ? ENTYVIO -                            WILL DISCUSS Gatha Mayer, MD 06/14/2016 8:35:35 AM This report has been signed electronically.

## 2016-06-14 NOTE — Progress Notes (Signed)
Report to PACU, RN, vss, BBS= Clear.  

## 2016-06-15 ENCOUNTER — Telehealth: Payer: Self-pay | Admitting: *Deleted

## 2016-06-15 NOTE — Telephone Encounter (Signed)
  Follow up Call-  Call back number 06/14/2016  Post procedure Call Back phone  # [phone]  Permission to leave phone message Yes  Some recent data might be hidden     Patient questions:  Do you have a fever, pain , or abdominal swelling? No. Pain Score  0 *  Have you tolerated food without any problems? Yes.    Have you been able to return to your normal activities? Yes.    Do you have any questions about your discharge instructions: Diet   No. Medications  No. Follow up visit  No.  Do you have questions or concerns about your Care? No.  Actions: * If pain score is 4 or above: No action needed, pain <4.

## 2016-06-19 ENCOUNTER — Other Ambulatory Visit: Payer: Medicare Other

## 2016-06-19 DIAGNOSIS — K5180 Other ulcerative colitis without complications: Secondary | ICD-10-CM

## 2016-06-20 ENCOUNTER — Other Ambulatory Visit: Payer: Self-pay

## 2016-06-20 DIAGNOSIS — K51019 Ulcerative (chronic) pancolitis with unspecified complications: Secondary | ICD-10-CM

## 2016-06-20 NOTE — Progress Notes (Signed)
Please let Sean Pope know biopsies show what I thought - active inflammation at end of colon and rectum  and pseudopolyps. Please ask how he is doing now that prednisone is added.  Let's try to get him started on Entyvio - indicate that he did not respond to Humira completely (his hx) and also got + PPD on that and is on 6 MP also 300 mg IV 0,2, 6 week then q 8 - try for [facility]  No need to recheck TB test as he was treated for latent TB in 2017  Taper prednisone 10 mg q 2 weeks until get to 10 and stay  Schedule f/u me march  No recall or letter from [facility]

## 2016-06-20 NOTE — Progress Notes (Signed)
amb ref gastro

## 2016-06-22 ENCOUNTER — Telehealth: Payer: Self-pay

## 2016-06-22 NOTE — Telephone Encounter (Signed)
-----   Message from Gatha Mayer, MD sent at 06/22/2016  8:08 AM EST ----- I will need to talk to him and get back ----- Message ----- From: Marlon Pel, RN Sent: 06/21/2016   4:15 PM To: Gatha Mayer, MD  See note from Amy do you want to try Remicade? ----- Message ----- From: Sargeant: 06/21/2016   3:51 PM To: Marlon Pel, RN  Hi Lashanta Elbe, I have spoken to [facility] and got their info for their site and have started precert.  I just received the form that has to be filled out from [place].  It says patient has to have an incomplete response, contraindication, or intolerance to BOTH Humira and Remicade.  I looked on his med list and it shows where he has had Humira but not Remicade.  Let me know what you want to do, Thanks, Amy ----- Message ----- From: Marlon Pel, RN Sent: 06/20/2016   2:56 PM To: Amy L [place]  Amy,  Will you look into a precert for [facility] for this patient.  He has [employer].  Dr. Carlean Purl wants to send him to [facility] for infusions.  The contact number for them is [phone] her contact name is Omega.  Will you please let me know if this can be approved.

## 2016-06-22 NOTE — Telephone Encounter (Signed)
Patient notified of the changes and that he should hear from Dr. Carlean Purl about the next step

## 2016-06-24 LAB — SERIAL MONITORING

## 2016-06-24 LAB — THIOPURINE METABOLITES
6 MMPN METABOLITE: 640 pmol/8x 10E8
6 TGN METABOLITE: 527 pmol/8x 10E8

## 2016-06-28 ENCOUNTER — Telehealth: Payer: Self-pay | Admitting: Internal Medicine

## 2016-07-02 ENCOUNTER — Other Ambulatory Visit: Payer: Self-pay | Admitting: Internal Medicine

## 2016-07-02 MED ORDER — MERCAPTOPURINE 50 MG PO TABS: ORAL_TABLET | ORAL | 2 refills | Status: DC

## 2016-07-02 NOTE — Progress Notes (Signed)
Advised reduce dose of 6 MP to 50/25 mg alternating qod He is much better on prednisone Still working on next biologic agent - remicade vs [employer]

## 2016-07-02 NOTE — Progress Notes (Signed)
6 TG levels high Dose adjustment 6 MP

## 2016-07-12 ENCOUNTER — Telehealth: Payer: Self-pay

## 2016-07-12 MED ORDER — VEDOLIZUMAB 300 MG IV SOLR: INTRAVENOUS | 6 refills | Status: DC

## 2016-07-12 NOTE — Telephone Encounter (Signed)
Patient notified that I will fax his referral to [facility] and he verbalized understanding he should hear from them in the next week or so to set up Entyvio infusions.

## 2016-07-12 NOTE — Telephone Encounter (Signed)
-----   Message from Darden Dates sent at 07/12/2016  8:58 AM EST ----- Regarding: RE: Weyman Rodney Got approval for Mr. Cotto. I'll fax approval to [place] so he can be scheduled. Thanks, Amy ----- Message ----- From: Marlon Pel, RN Sent: 07/09/2016  11:15 AM To: Darden Dates Subject: RE: Doristine Devoid faxed it back ----- Message ----- From: Darden Dates Sent: 07/09/2016   8:54 AM To: Marlon Pel, RN Subject: Darrick Grinder, Finally got some info from drug rep for [facility] about [employer] with new form for this year.  I need help with second page, can you fill out and fax back to me? [phone] (Sent fax near you) Hopefully it wont take long to get response back from [place]. Thanks a lot! Amy

## 2016-07-12 NOTE — Addendum Note (Signed)
Addended by: Marlon Pel on: 07/12/2016 12:25 PM   Modules accepted: Orders

## 2016-07-16 NOTE — Telephone Encounter (Signed)
Patient is notified that appt with [facility] for 07/23/16 8:00

## 2016-08-24 ENCOUNTER — Encounter: Payer: Self-pay | Admitting: Internal Medicine

## 2016-08-24 ENCOUNTER — Ambulatory Visit (INDEPENDENT_AMBULATORY_CARE_PROVIDER_SITE_OTHER): Payer: 59 | Admitting: Internal Medicine

## 2016-08-24 ENCOUNTER — Encounter (INDEPENDENT_AMBULATORY_CARE_PROVIDER_SITE_OTHER): Payer: Self-pay

## 2016-08-24 DIAGNOSIS — Z79899 Other long term (current) drug therapy: Secondary | ICD-10-CM

## 2016-08-24 DIAGNOSIS — K5100 Ulcerative (chronic) pancolitis without complications: Secondary | ICD-10-CM | POA: Diagnosis not present

## 2016-08-24 DIAGNOSIS — D508 Other iron deficiency anemias: Secondary | ICD-10-CM | POA: Diagnosis not present

## 2016-08-24 DIAGNOSIS — Z7960 Long term (current) use of unspecified immunomodulators and immunosuppressants: Secondary | ICD-10-CM

## 2016-08-24 MED ORDER — ZOSTER VAC RECOMB ADJUVANTED 50 MCG/0.5ML IM SUSR: 50.0000 ug | Freq: Once | INTRAMUSCULAR | 1 refills | Status: AC

## 2016-08-24 NOTE — Assessment & Plan Note (Addendum)
Labs ok no toxicity - Hgb better w/ reduced dose (also dz better) Shingrix Rx to reduce risk of zoster

## 2016-08-24 NOTE — Assessment & Plan Note (Addendum)
Much better than he was - "I know what remission is" Continue 6MP, Entyvio, Prednisone 10 mg qd x 1 mo then 5 mg qd x 1 mo then off RTC 3 mos

## 2016-08-24 NOTE — Patient Instructions (Signed)
  We have sent the following medications to your pharmacy for you to pick up at your convenience: Shingles vaccination   Please start 4m of Prednisone daily April 1st and take it for a month stopping it May 1st.  If you start to feel bad call [facility] before your next appointment.    Please follow up with [facility] early June, I'll call you to set this up.    I appreciate the opportunity to care for you. CSilvano Rusk MD, [facility]

## 2016-08-24 NOTE — Assessment & Plan Note (Addendum)
Improved Hgb 11.6 On ferrous sulfate Off Epo (cost) Recheck B12 next time Have reduced 6 MP dose as 6Tg levels high - last time 527 just above therapeutic - so not rechecking as think in range now w/ reduction

## 2016-08-25 MED ORDER — PREDNISONE 10 MG PO TABS: 10.0000 mg | ORAL_TABLET | Freq: Every day | ORAL | 1 refills | Status: DC

## 2016-08-25 NOTE — Progress Notes (Signed)
Sean Pope 71 y.o. 1945-10-24 [id number]  Assessment & Plan:   Ulcerative chronic pancolitis ([place]) Much better than he was - "I know what remission is" Continue 6MP, Entyvio, Prednisone 10 mg qd x 1 mo then 5 mg qd x 1 mo then off RTC 3 mos  Anemia-multifactorial Improved Hgb 11.6 On ferrous sulfate Off Epo (cost) Recheck B12 next time Have reduced 6 MP dose as 6Tg levels high - last time 527 just above therapeutic - so not rechecking as think in range now w/ reduction  Long-term use of immunosuppressant medication-mercaptopurine and  Entyvio Labs ok no toxicity - Hgb better w/ reduced dose (also dz better) Shingrix Rx to reduce risk of zoster  Note has Nalfon fenoprofen on list but in past told me not taking - has IBD and renal risks - will clarify w/ patient  YS:AYTKZS, Serina Cowper, MD Elmarie Shiley, MD Sabino Niemann, MD  Subjective:   Chief Complaint: f/u ulcerative colitis  HPI Sean Pope returns - improved significantly - on 10 mg prednisone, 50/25 alternating 6MP and entyvio infusions. About 4 soft stools a day, no incontinence and no Lomotil use. Still wears Depends at work "just in case" but not at home.  Using furosemide less as less edema  Weight going up some - eating better Wt Readings from Last 3 Encounters:  08/24/16 150 lb 4 oz (68.2 kg)  06/14/16 143 lb (64.9 kg)  04/06/16 143 lb (64.9 kg)   No Known Allergies Current Meds  Medication Sig  . calcium carbonate (CALCIUM 600) 600 MG TABS tablet Take 600 mg by mouth daily.  . Cholecalciferol (VITAMIN D3) 1000 units CAPS Take 1 capsule by mouth daily.  . fenoprofen (NALFON) 600 MG TABS tablet Take 600 mg by mouth daily. Reported on 10/13/2015  . Ferrous Sulfate (IRON) 325 (65 FE) MG TABS Take 1 tablet by mouth 2 (two) times daily.   . fluticasone (CUTIVATE) 0.05 % cream APPLY TO AFFECTED AREA TWICE A DAY AS NEEDED  . furosemide (LASIX) 40 MG tablet Take 2 tablets (80 mg total) by mouth daily. (Patient taking  differently: Take 40 mg by mouth daily. PT TAKING ONE DAILY)  . mercaptopurine (PURINETHOL) 50 MG tablet 50 mg daily alternating with 25 mg daily Give on an empty stomach 1 hour before or 2 hours after meals. Caution: Chemotherapy.  . Multiple Vitamins-Minerals (MENS MULTIVITAMIN PLUS PO) Take 1 tablet by mouth. Centrum Silver  . predniSONE (DELTASONE) 10 MG tablet Take 1 tablet (10 mg total) by mouth daily with breakfast. On April 1 5 mg daily on May 1 stop  . simvastatin (ZOCOR) 40 MG tablet Take 40 mg by mouth every evening.  . vedolizumab (ENTYVIO) 300 MG injection Infuse 300 mg on week 0, week 2, week 6, then q 8 weeks  . [DISCONTINUED] predniSONE (DELTASONE) 10 MG tablet Take 4 tablets (40 mg total) by mouth daily with breakfast. Will advise taper later (Patient taking differently: Take 10 mg by mouth daily with breakfast. Will advise taper later)   Past Medical History:  Diagnosis Date  . Acute respiratory failure with hypoxia ([place]) 09/17/2011  . AKI (acute kidney injury) ([place])    In the setting of ulcerative colitis flare, question relationship to mesalamine  . Anal fissure - posterior 01/22/2014  . Anemia-multifactorial 01/05/2014   Blood loss, medications, chronic illness kidney injury  . B12 deficiency - borderline 12/21/2014  . C. difficile colitis 06/02/2014  . Chronic kidney disease, stage III (moderate) 09/08/2013  .  Hyperlipidemia   . Hypertension   . Long-term use of immunosuppressant medication-mercaptopurine and Humira 01/05/2014  . Lung collapse 09/26/2011  . Osteoporosis 06/29/2014   DEXA 06/2014 - lowest t score -4.2  . 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 Protein-calorie malnutrition, severe ([place]) 10/26/2013  . Respiratory failure, acute ([place]) 01/07/2014  . SAH (subarachnoid hemorrhage) ([place])   . Stroke ([place])   . Ulcerative colitis ([place]) 05/28/2013  . Vitamin D deficiency 06/03/2014   Past Surgical History:  Procedure Laterality Date  . ANEURYSM COILING   2013   Posterior communicating  . CATARACT EXTRACTION Bilateral aug, sept 2016  . COLONOSCOPY W/ BIOPSIES  05/28/2013   Medications, allergies, past medical history, past surgical history, family history and social history are reviewed and updated in the EMR.   Review of Systems As above  Objective:   Physical Exam BP 130/86   Pulse 72   Ht 5' 10"  (1.778 m)   Wt 150 lb 4 oz (68.2 kg)   BMI 21.56 kg/m  NAD Eyes anicteric Lungs cta Ht S1s2 no murmur Abd soft NY no mass BS + Ext no edema  Data: 3/6 labs Hgb 11.6,  Creat 1.7 GFR 41 AST and ALT NL

## 2016-08-27 ENCOUNTER — Telehealth: Payer: Self-pay

## 2016-08-27 NOTE — Telephone Encounter (Signed)
Spoke with [name] and he will check to see if he has the fenoprofen and stop it if he does.  He is not familiar with that name.  He doesn't take a b12 supplement, do you want him too?   He went Sat and got his shingles shot, said his insurance covers it 100% and it is a 2 part shot, has to go back in 30 days for the second part.  Makes your arm sore he said.

## 2016-08-27 NOTE — Telephone Encounter (Signed)
-----   Message from Gatha Mayer, MD sent at 08/25/2016  9:29 AM EST ----- Regarding: ? for mike Please find out  1) does he take any fenoprofen (Nalfon) - would be better for kidneys and UC if does not take - would like to dc  2) Is he on a B12 supplement?

## 2016-08-28 NOTE — Telephone Encounter (Signed)
Pt said to let you know the medication you called about yesterday.  He is not taking that med

## 2016-09-04 ENCOUNTER — Telehealth: Payer: Self-pay | Admitting: Internal Medicine

## 2016-09-04 NOTE — Telephone Encounter (Signed)
Did you want him to take a B12 supplement Sir?

## 2016-09-04 NOTE — Telephone Encounter (Signed)
Patient notified ok to take as Hep B is not a live vaccine.  He will call for additional questions or concerns.

## 2016-09-05 ENCOUNTER — Other Ambulatory Visit: Payer: Self-pay | Admitting: Internal Medicine

## 2016-09-05 NOTE — Telephone Encounter (Signed)
Spoke with [name] and he will go today and get the Vitamin B12 to start today, added to his medicine list.

## 2016-09-05 NOTE — Telephone Encounter (Signed)
Yes please 1000 ug oral daily

## 2016-09-24 ENCOUNTER — Other Ambulatory Visit: Payer: Self-pay | Admitting: Internal Medicine

## 2016-09-25 NOTE — Telephone Encounter (Signed)
Done

## 2016-10-16 ENCOUNTER — Telehealth: Payer: Self-pay

## 2016-10-16 NOTE — Telephone Encounter (Signed)
Left him a message to call for his early June f/u appointment with Dr Carlean Purl.

## 2016-10-16 NOTE — Telephone Encounter (Signed)
-----   Message from Saleah Rishel E [place], [place] sent at 08/24/2016  9:20 AM EST ----- Make him a follow up appt for early June.

## 2016-10-16 NOTE — Telephone Encounter (Signed)
Sean Pope called back and set up his June appointment for 12/06/16.

## 2016-12-06 ENCOUNTER — Encounter (INDEPENDENT_AMBULATORY_CARE_PROVIDER_SITE_OTHER): Payer: Self-pay

## 2016-12-06 ENCOUNTER — Ambulatory Visit (INDEPENDENT_AMBULATORY_CARE_PROVIDER_SITE_OTHER): Payer: 59 | Admitting: Internal Medicine

## 2016-12-06 ENCOUNTER — Encounter: Payer: Self-pay | Admitting: Internal Medicine

## 2016-12-06 VITALS — BP 124/72 | HR 66 | Ht 70.0 in | Wt 154.6 lb

## 2016-12-06 DIAGNOSIS — B079 Viral wart, unspecified: Secondary | ICD-10-CM | POA: Diagnosis not present

## 2016-12-06 DIAGNOSIS — Z79899 Other long term (current) drug therapy: Secondary | ICD-10-CM | POA: Diagnosis not present

## 2016-12-06 DIAGNOSIS — K5100 Ulcerative (chronic) pancolitis without complications: Secondary | ICD-10-CM | POA: Diagnosis not present

## 2016-12-06 DIAGNOSIS — Z7960 Long term (current) use of unspecified immunomodulators and immunosuppressants: Secondary | ICD-10-CM

## 2016-12-06 MED ORDER — PREDNISONE 10 MG PO TABS: 5.0000 mg | ORAL_TABLET | Freq: Every day | ORAL | 1 refills | Status: DC

## 2016-12-06 MED ORDER — MERCAPTOPURINE 50 MG PO TABS: 25.0000 mg | ORAL_TABLET | Freq: Every day | ORAL | 2 refills | Status: DC

## 2016-12-06 NOTE — Progress Notes (Signed)
Sean Pope 71 y.o. 10/20/1945 [id number]  Assessment & Plan:   Encounter Diagnoses  Name Primary?  . Ulcerative chronic pancolitis without complications ([place])   . Viral warts, unspecified type Yes  . Long-term use of immunosuppressant medication    Ulcerative chronic pancolitis ([place]) Better 4 formed stools/day Wean off prednisone 6 mp 25/day   Return in about 3 months  See Dr. Allyson Sabal regarding what looks like a wart on his arm and he says on his leg. I reminded the patient that he has an increased risk of skin cancer on mercaptopurine. At this point I'm inclined to continue that though we consider discontinuing it later on after he is on Entyvio longer.  I had thought I had more recent CBC and LFTs. I need to have him get that done. Monitoring of 6 MP. Note he was supposed to go to 50 alternating with 25 mg of 6-MP but his been doing 25 mg daily and I'm going to leave it there. I was adjusting a downward due to his high 6-TG levels. That was before he started Entyvio.  Cc Dr. Nena Polio, Dr. Elmarie Shiley and Dr. Kathyrn Lass  Subjective:   Chief Complaint:Follow-up of ulcerative colitis  HPI Sean Pope reports that he is doing well overall. Bowels are forming up. He does not use Lomotil though he is not ready to stop wearing adult diapers in case of an accident. His skin is easy to bruise and he has a wart on his left forearm and on his leg he thinks. He knows he needs to go see Dr. Allyson Sabal and plans to call.  No Known Allergies Current Meds  Medication Sig  . calcium carbonate (CALCIUM 600) 600 MG TABS tablet Take 600 mg by mouth daily.  . Cholecalciferol (VITAMIN D3) 1000 units CAPS Take 1 capsule by mouth daily.  . Ferrous Sulfate (IRON) 325 (65 FE) MG TABS Take 1 tablet by mouth 2 (two) times daily.   . fluticasone (CUTIVATE) 0.05 % cream APPLY TO AFFECTED AREA TWICE A DAY AS NEEDED  . furosemide (LASIX) 40 MG tablet Take 2 tablets (80 mg total) by mouth daily. (Patient taking  differently: Take 40 mg by mouth daily. PT TAKING 1/2 of 62m daily)  . mercaptopurine (PURINETHOL) 50 MG tablet Take 0.5 tablets (25 mg total) by mouth daily. 50 mg daily alternating with 25 mg daily Give on an empty stomach 1 hour before or 2 hours after meals. Caution: Chemotherapy.  . Multiple Vitamins-Minerals (MENS MULTIVITAMIN PLUS PO) Take 1 tablet by mouth. Centrum Silver  . predniSONE (DELTASONE) 10 MG tablet Take 1 tablet (10 mg total) by mouth daily with breakfast. On April 1 5 mg daily on May 1 stop  . simvastatin (ZOCOR) 40 MG tablet Take 40 mg by mouth every evening.  . vedolizumab (ENTYVIO) 300 MG injection Infuse 300 mg on week 0, week 2, week 6, then q 8 weeks  . vitamin B-12 (CYANOCOBALAMIN) 1000 MCG tablet Take 1,000 mcg by mouth daily.  . [DISCONTINUED] mercaptopurine (PURINETHOL) 50 MG tablet 50 mg daily alternating with 25 mg daily Give on an empty stomach 1 hour before or 2 hours after meals. Caution: Chemotherapy.  . [DISCONTINUED] mercaptopurine (PURINETHOL) 50 MG tablet TAKE 1 AND 1/2 TABLET BY MOUTH EVERY DAY ON AN EMPTY STOMACH 1 HOURS BEFORE OR 2 HOURS AFTER MEALS   Past Medical History:  Diagnosis Date  . Acute respiratory failure with hypoxia ([place] 09/17/2011  . AKI (acute kidney injury) ([place]  In the setting of ulcerative colitis flare, question relationship to mesalamine  . Anal fissure - posterior 01/22/2014  . Anemia-multifactorial 01/05/2014   Blood loss, medications, chronic illness kidney injury  . B12 deficiency - borderline 12/21/2014  . C. difficile colitis 06/02/2014  . Chronic kidney disease, stage III (moderate) 09/08/2013  . Hyperlipidemia   . Hypertension   . Long-term use of immunosuppressant medication-mercaptopurine and Humira 01/05/2014  . Lung collapse 09/26/2011  . Osteoporosis 06/29/2014   DEXA 06/2014 - lowest t score -4.2  . 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 Protein-calorie malnutrition, severe ([place]) 10/26/2013  .  Respiratory failure, acute ([place]) 01/07/2014  . SAH (subarachnoid hemorrhage) ([place])   . Stroke ([place])   . Ulcerative colitis ([place]) 05/28/2013  . Vitamin D deficiency 06/03/2014   Past Surgical History:  Procedure Laterality Date  . ANEURYSM COILING  2013   Posterior communicating  . CATARACT EXTRACTION Bilateral aug, sept 2016  . COLONOSCOPY W/ BIOPSIES  05/28/2013   Social History   Social History  . Marital status: Married    Spouse name: N/A  . Number of children: 2  . Years of education: N/A   Occupational History  . Director     [employer]  .  Retired   Social History Main Topics  . Smoking status: Former Smoker    Packs/day: 0.50    Years: 50.00    Types: Cigarettes    Quit date: 06/19/2011  . Smokeless tobacco: Never Used  . Alcohol use 0.0 oz/week     Comment: rare  . Drug use: No  . Sexual activity: No   Other Topics Concern  . 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   Review of Systems Wt Readings from Last 3 Encounters:  12/06/16 154 lb 9.6 oz (70.1 kg)  08/24/16 150 lb 4 oz (68.2 kg)  06/14/16 143 lb (64.9 kg)     Objective:   Physical Exam BP 124/72   Pulse 66   Ht 5' 10"  (1.778 m)   Wt 154 lb 9.6 oz (70.1 kg)   BMI 22.18 kg/m  No acute distress  Eyes anicteric  Lungs clear  Heart S1-S2 no rubs or gallops  Abdomen soft and nontender  Skin there is a warty lesion on the left forearm. He has senile purpura changes throughout.    Previous labs reviewed. Recent [facility] labs and records reviewed.

## 2016-12-06 NOTE — Patient Instructions (Signed)
  Wen your Prednisone.   Please tell your dermatologist to send [place] a note after your visit.   I'll call you in early September for an October appointment.     I appreciate the opportunity to care for you. Silvano Rusk, MD, [facility]

## 2016-12-06 NOTE — Assessment & Plan Note (Signed)
Better 4 formed stools/day Wean off prednisone 6 mp 25/day

## 2016-12-07 ENCOUNTER — Telehealth: Payer: Self-pay

## 2016-12-07 DIAGNOSIS — Z79899 Other long term (current) drug therapy: Secondary | ICD-10-CM

## 2016-12-07 NOTE — Telephone Encounter (Signed)
Left detailed message for [name] to come get labs, orders put in.

## 2016-12-07 NOTE — Telephone Encounter (Signed)
-----   Message from Gatha Mayer, MD sent at 12/06/2016  6:31 PM EDT ----- Regarding: Needs labs Tell him I'm sorry I made a mistake and I do need to get a CBC and LFTs. This is because of his long-term immunosuppressant medication. That diagnosis to use. If he is seeing Dr. Graylon Gunning really similar we can wait and have Dr. Posey Pronto check these as he usually does. Dr. Posey Pronto may not get liver chemistries but we can ask please give me follow-up

## 2016-12-23 ENCOUNTER — Other Ambulatory Visit: Payer: Self-pay | Admitting: Internal Medicine

## 2017-05-28 ENCOUNTER — Other Ambulatory Visit: Payer: Self-pay | Admitting: Internal Medicine

## 2017-09-20 ENCOUNTER — Other Ambulatory Visit: Payer: Self-pay | Admitting: Neurosurgery

## 2017-09-20 DIAGNOSIS — I6031 Nontraumatic subarachnoid hemorrhage from right posterior communicating artery: Secondary | ICD-10-CM

## 2017-10-17 ENCOUNTER — Other Ambulatory Visit: Payer: Self-pay | Admitting: Internal Medicine

## 2017-10-22 ENCOUNTER — Other Ambulatory Visit: Payer: Self-pay | Admitting: Neurosurgery

## 2017-10-22 ENCOUNTER — Encounter (HOSPITAL_COMMUNITY): Payer: Self-pay

## 2017-10-22 ENCOUNTER — Ambulatory Visit (HOSPITAL_COMMUNITY)
Admission: RE | Admit: 2017-10-22 | Discharge: 2017-10-22 | Disposition: A | Discharge status: Home / Self Care | Payer: BLUE CROSS/BLUE SHIELD | Source: Ambulatory Visit | Attending: Neurosurgery | Admitting: Neurosurgery

## 2017-10-22 DIAGNOSIS — I671 Cerebral aneurysm, nonruptured: Secondary | ICD-10-CM | POA: Diagnosis not present

## 2017-10-22 DIAGNOSIS — I6031 Nontraumatic subarachnoid hemorrhage from right posterior communicating artery: Secondary | ICD-10-CM

## 2017-10-22 DIAGNOSIS — I129 Hypertensive chronic kidney disease with stage 1 through stage 4 chronic kidney disease, or unspecified chronic kidney disease: Secondary | ICD-10-CM | POA: Diagnosis not present

## 2017-10-22 DIAGNOSIS — D509 Iron deficiency anemia, unspecified: Secondary | ICD-10-CM | POA: Insufficient documentation

## 2017-10-22 DIAGNOSIS — Z9841 Cataract extraction status, right eye: Secondary | ICD-10-CM | POA: Diagnosis not present

## 2017-10-22 DIAGNOSIS — Z8619 Personal history of other infectious and parasitic diseases: Secondary | ICD-10-CM | POA: Insufficient documentation

## 2017-10-22 DIAGNOSIS — Z8673 Personal history of transient ischemic attack (TIA), and cerebral infarction without residual deficits: Secondary | ICD-10-CM | POA: Insufficient documentation

## 2017-10-22 DIAGNOSIS — Z87891 Personal history of nicotine dependence: Secondary | ICD-10-CM | POA: Insufficient documentation

## 2017-10-22 DIAGNOSIS — E538 Deficiency of other specified B group vitamins: Secondary | ICD-10-CM | POA: Insufficient documentation

## 2017-10-22 DIAGNOSIS — Z9889 Other specified postprocedural states: Secondary | ICD-10-CM | POA: Diagnosis not present

## 2017-10-22 DIAGNOSIS — E559 Vitamin D deficiency, unspecified: Secondary | ICD-10-CM | POA: Diagnosis not present

## 2017-10-22 DIAGNOSIS — E43 Unspecified severe protein-calorie malnutrition: Secondary | ICD-10-CM | POA: Insufficient documentation

## 2017-10-22 DIAGNOSIS — Z9842 Cataract extraction status, left eye: Secondary | ICD-10-CM | POA: Diagnosis not present

## 2017-10-22 DIAGNOSIS — N183 Chronic kidney disease, stage 3 (moderate): Secondary | ICD-10-CM | POA: Diagnosis not present

## 2017-10-22 DIAGNOSIS — Z79899 Other long term (current) drug therapy: Secondary | ICD-10-CM | POA: Insufficient documentation

## 2017-10-22 DIAGNOSIS — N179 Acute kidney failure, unspecified: Secondary | ICD-10-CM | POA: Insufficient documentation

## 2017-10-22 DIAGNOSIS — M810 Age-related osteoporosis without current pathological fracture: Secondary | ICD-10-CM | POA: Insufficient documentation

## 2017-10-22 DIAGNOSIS — E785 Hyperlipidemia, unspecified: Secondary | ICD-10-CM | POA: Insufficient documentation

## 2017-10-22 HISTORY — PX: IR ANGIO VERTEBRAL SEL VERTEBRAL UNI L MOD SED: IMG5367

## 2017-10-22 HISTORY — PX: IR ANGIO INTRA EXTRACRAN SEL INTERNAL CAROTID BILAT MOD SED: IMG5363

## 2017-10-22 LAB — BASIC METABOLIC PANEL
Anion gap: 6 (ref 5–15)
BUN: 33 mg/dL — ABNORMAL HIGH (ref 6–20)
CALCIUM: 9.8 mg/dL (ref 8.9–10.3)
CO2: 29 mmol/L (ref 22–32)
CREATININE: 1.79 mg/dL — AB (ref 0.61–1.24)
Chloride: 107 mmol/L (ref 101–111)
GFR, EST AFRICAN AMERICAN: 42 mL/min — AB (ref >60)
GFR, EST NON AFRICAN AMERICAN: 36 mL/min — AB (ref >60)
GLUCOSE: 108 mg/dL — AB (ref 65–99)
Potassium: 4.4 mmol/L (ref 3.5–5.1)
Sodium: 142 mmol/L (ref 135–145)

## 2017-10-22 LAB — CBC WITH DIFFERENTIAL/PLATELET
Basophils Absolute: 0.1 10*3/uL (ref 0.0–0.1)
Basophils Relative: 1 %
EOS ABS: 0.3 10*3/uL (ref 0.0–0.7)
Eosinophils Relative: 4 %
HCT: 40.7 % (ref 39.0–52.0)
Hemoglobin: 12.9 g/dL — ABNORMAL LOW (ref 13.0–17.0)
Lymphocytes Relative: 30 %
Lymphs Abs: 2.3 10*3/uL (ref 0.7–4.0)
MCH: 31.7 pg (ref 26.0–34.0)
MCHC: 31.7 g/dL (ref 30.0–36.0)
MCV: 100 fL (ref 78.0–100.0)
MONO ABS: 0.6 10*3/uL (ref 0.1–1.0)
MONOS PCT: 8 %
NEUTROS PCT: 57 %
Neutro Abs: 4.5 10*3/uL (ref 1.7–7.7)
Platelets: 341 10*3/uL (ref 150–400)
RBC: 4.07 MIL/uL — ABNORMAL LOW (ref 4.22–5.81)
RDW: 15.6 % — AB (ref 11.5–15.5)
WBC: 7.7 10*3/uL (ref 4.0–10.5)

## 2017-10-22 LAB — PROTIME-INR
INR: 1
PROTHROMBIN TIME: 13.1 s (ref 11.4–15.2)

## 2017-10-22 LAB — APTT: APTT: 33 s (ref 24–36)

## 2017-10-22 MED ORDER — LIDOCAINE HCL (PF) 1 % IJ SOLN
INTRAMUSCULAR | Status: AC | PRN
  Administered 2017-10-22: 10 mL

## 2017-10-22 MED ORDER — HEPARIN SODIUM (PORCINE) 1000 UNIT/ML IJ SOLN
INTRAMUSCULAR | Status: AC
  Filled 2017-10-22: qty 1

## 2017-10-22 MED ORDER — SODIUM CHLORIDE 0.9 % IV SOLN: INTRAVENOUS | Status: DC

## 2017-10-22 MED ORDER — IOPAMIDOL (ISOVUE-300) INJECTION 61%
INTRAVENOUS | Status: AC
  Administered 2017-10-22: 30 mL
  Filled 2017-10-22: qty 100

## 2017-10-22 MED ORDER — FENTANYL CITRATE (PF) 100 MCG/2ML IJ SOLN
INTRAMUSCULAR | Status: AC | PRN
  Administered 2017-10-22: 25 ug via INTRAVENOUS

## 2017-10-22 MED ORDER — CHLORHEXIDINE GLUCONATE CLOTH 2 % EX PADS: 6.0000 | MEDICATED_PAD | Freq: Once | CUTANEOUS | Status: DC

## 2017-10-22 MED ORDER — HEPARIN SODIUM (PORCINE) 1000 UNIT/ML IJ SOLN
INTRAMUSCULAR | Status: AC | PRN
  Administered 2017-10-22: 2000 [IU] via INTRAVENOUS

## 2017-10-22 MED ORDER — FENTANYL CITRATE (PF) 100 MCG/2ML IJ SOLN
INTRAMUSCULAR | Status: AC
  Filled 2017-10-22: qty 4

## 2017-10-22 MED ORDER — CEFAZOLIN SODIUM-DEXTROSE 2-4 GM/100ML-% IV SOLN: 2.0000 g | INTRAVENOUS | Status: DC

## 2017-10-22 MED ORDER — MIDAZOLAM HCL 2 MG/2ML IJ SOLN
INTRAMUSCULAR | Status: AC | PRN
  Administered 2017-10-22: 1 mg via INTRAVENOUS

## 2017-10-22 MED ORDER — HYDROCODONE-ACETAMINOPHEN 5-325 MG PO TABS: 1.0000 | ORAL_TABLET | ORAL | Status: DC | PRN

## 2017-10-22 MED ORDER — MIDAZOLAM HCL 2 MG/2ML IJ SOLN
INTRAMUSCULAR | Status: AC
  Filled 2017-10-22: qty 4

## 2017-10-22 MED ORDER — LIDOCAINE HCL 1 % IJ SOLN
INTRAMUSCULAR | Status: AC
  Filled 2017-10-22: qty 20

## 2017-10-22 NOTE — Sedation Documentation (Signed)
MD states ASA 3 and Mallampati 2

## 2017-10-22 NOTE — H&P (Signed)
Chief Complaint  Previous aneurysm  History of Present Illness  Sean Pope is a 72 y.o. male who suffered a subarachnoid hemorrhage due to ruptured Pcom aneurysm for which he underwent coiling. He made an excellent recovery. He underwent f/u angiogram about 4 months after his hemorrhage, and presents today for routine long-term angiographic f/u.  Past Medical History   Past Medical History:  Diagnosis Date  . Acute respiratory failure with hypoxia ([place]) 09/17/2011  . AKI (acute kidney injury) ([place])    In the setting of ulcerative colitis flare, question relationship to mesalamine  . Anal fissure - posterior 01/22/2014  . Anemia-multifactorial 01/05/2014   Blood loss, medications, chronic illness kidney injury  . B12 deficiency - borderline 12/21/2014  . C. difficile colitis 06/02/2014  . Chronic kidney disease, stage III (moderate) ([place]) 09/08/2013  . Hyperlipidemia   . Hypertension   . Long-term use of immunosuppressant medication-mercaptopurine and Humira 01/05/2014  . Lung collapse 09/26/2011  . Osteoporosis 06/29/2014   DEXA 06/2014 - lowest t score -4.2  . 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 Protein-calorie malnutrition, severe ([place]) 10/26/2013  . Respiratory failure, acute ([place]) 01/07/2014  . SAH (subarachnoid hemorrhage) ([place])   . Stroke ([place])   . Ulcerative colitis ([place]) 05/28/2013  . Vitamin D deficiency 06/03/2014    Past Surgical History   Past Surgical History:  Procedure Laterality Date  . ANEURYSM COILING  2013   Posterior communicating  . CATARACT EXTRACTION Bilateral aug, sept 2016  . COLONOSCOPY W/ BIOPSIES  05/28/2013    Social History   Social History   Tobacco Use  . Smoking status: Former Smoker    Packs/day: 0.50    Years: 50.00    Pack years: 25.00    Types: Cigarettes    Last attempt to quit: 06/19/2011    Years since quitting: 6.3  . Smokeless tobacco: Never Used  Substance Use Topics  . Alcohol use: Yes    Alcohol/week: 0.0 oz      Comment: rare  . Drug use: No    Medications   Prior to Admission medications   Medication Sig Start Date End Date Taking? Authorizing Provider  calcium carbonate (CALCIUM 600) 600 MG TABS tablet Take 600 mg by mouth daily.   Yes [provider]  Cholecalciferol (VITAMIN D3) 1000 units CAPS Take 1 capsule by mouth daily.   Yes [provider]  Ferrous Sulfate (IRON) 325 (65 FE) MG TABS Take 1 tablet by mouth 2 (two) times daily.    Yes [provider]  fluticasone (CUTIVATE) 0.05 % cream APPLY TO AFFECTED AREA TWICE A DAY AS NEEDED 05/11/15  Yes [provider]  furosemide (LASIX) 40 MG tablet Take 2 tablets (80 mg total) by mouth daily. Patient taking differently: Take 40 mg by mouth daily. PT TAKING 1/2 of 81m daily 03/12/14  Yes GGatha Mayer MD  mercaptopurine (PURINETHOL) 50 MG tablet Take 0.5 tablets (25 mg total) by mouth daily. 50 mg daily alternating with 25 mg daily Give on an empty stomach 1 hour before or 2 hours after meals. Caution: Chemotherapy. 12/06/16  Yes GGatha Mayer MD  mercaptopurine (PURINETHOL) 50 MG tablet TAKE 1 AND 1/2 TABLET BY MOUTH EVERY DAY ON AN EMPTY STOMACH 1 HOURS BEFORE OR 2 HOURS AFTER MEALS 10/17/17  Yes GGatha Mayer MD  Multiple Vitamins-Minerals (MENS MULTIVITAMIN PLUS PO) Take 1 tablet by mouth. Centrum Silver   Yes [provider]  vitamin B-12 (CYANOCOBALAMIN) 1000 MCG tablet  Take 1,000 mcg by mouth daily.   Yes [provider]  predniSONE (DELTASONE) 10 MG tablet Take 0.5 tablets (5 mg total) by mouth daily with breakfast. On 7/5 take 5 mg every other day and on 7/22 STOP 12/06/16   Gatha Mayer, MD  simvastatin (ZOCOR) 40 MG tablet Take 40 mg by mouth every evening.    [provider]  vedolizumab (ENTYVIO) 300 MG injection Infuse 300 mg on week 0, week 2, week 6, then q 8 weeks 07/12/16   Gatha Mayer, MD    Allergies  No Known Allergies  Review of Systems   ROS  Neurologic Exam  Awake, alert, oriented Memory and concentration grossly intact Speech fluent, appropriate CN grossly intact Motor exam: Upper Extremities Deltoid Bicep Tricep Grip  Right 5/5 5/5 5/5 5/5  Left 5/5 5/5 5/5 5/5   Lower Extremities IP Quad PF DF EHL  Right 5/5 5/5 5/5 5/5 5/5  Left 5/5 5/5 5/5 5/5 5/5   Sensation grossly intact to LT  Impression  - 72 y.o. male about 71yr s/p coiling of ruptured right Pcom aneurysm, doing well  Plan  - Proceed with f/u angiogram  I have reviewed the risks, benefits, and alternatives to the angiogram with the patient in the office. All questions were answered and consent was obtained.

## 2017-10-22 NOTE — Progress Notes (Signed)
Up and walked and tolerated well; right groin stable no bleeding or hematoma

## 2017-10-22 NOTE — Discharge Instructions (Signed)
Moderate Conscious Sedation, Adult, Care After These instructions provide you with information about caring for yourself after your procedure. Your health care provider may also give you more specific instructions. Your treatment has been planned according to current medical practices, but problems sometimes occur. Call your health care provider if you have any problems or questions after your procedure. What can I expect after the procedure? After your procedure, it is common:  To feel sleepy for several hours.  To feel clumsy and have poor balance for several hours.  To have poor judgment for several hours.  To vomit if you eat too soon.  Follow these instructions at home: For at least 24 hours after the procedure:   Do not: ? Participate in activities where you could fall or become injured. ? Drive. ? Use heavy machinery. ? Drink alcohol. ? Take sleeping pills or medicines that cause drowsiness. ? Make important decisions or sign legal documents. ? Take care of children on your own.  Rest. Eating and drinking  Follow the diet recommended by your health care provider.  If you vomit: ? Drink water, juice, or soup when you can drink without vomiting. ? Make sure you have little or no nausea before eating solid foods. General instructions  Have a responsible adult stay with you until you are awake and alert.  Take over-the-counter and prescription medicines only as told by your health care provider.  If you smoke, do not smoke without supervision.  Keep all follow-up visits as told by your health care provider. This is important. Contact a health care provider if:  You keep feeling nauseous or you keep vomiting.  You feel light-headed.  You develop a rash.  You have a fever. Get help right away if:  You have trouble breathing. This information is not intended to replace advice given to you by your health care provider. Make sure you discuss any questions you have  with your health care provider. Document Released: 03/25/2013 Document Revised: 11/07/2015 Document Reviewed: 09/24/2015 Elsevier Interactive Patient Education  2018 [place].  Femoral Site Care Refer to this sheet in the next few weeks. These instructions provide you with information about caring for yourself after your procedure. Your health care provider may also give you more specific instructions. Your treatment has been planned according to current medical practices, but problems sometimes occur. Call your health care provider if you have any problems or questions after your procedure. What can I expect after the procedure? After your procedure, it is typical to have the following:  Bruising at the site that usually fades within 1-2 weeks.  Blood collecting in the tissue (hematoma) that may be painful to the touch. It should usually decrease in size and tenderness within 1-2 weeks.  Follow these instructions at home:  Take medicines only as directed by your health care provider.  You may shower 24-48 hours after the procedure or as directed by your health care provider. Remove the bandage (dressing) and gently wash the site with plain soap and water. Pat the area dry with a clean towel. Do not rub the site, because this may cause bleeding.  Do not take baths, swim, or use a hot tub until your health care provider approves.  Check your insertion site every day for redness, swelling, or drainage.  Do not apply powder or lotion to the site.  Limit use of stairs to twice a day for the first 2-3 days or as directed by your health care provider.  Do not squat for the first 2-3 days or as directed by your health care provider.  Do not lift over 10 lb (4.5 kg) for 5 days after your procedure or as directed by your health care provider.  Ask your health care provider when it is okay to: ? Return to work or school. ? Resume usual physical activities or sports. ? Resume sexual  activity.  Do not drive home if you are discharged the same day as the procedure. Have someone else drive you.  You may drive 24 hours after the procedure unless otherwise instructed by your health care provider.  Do not operate machinery or power tools for 24 hours after the procedure or as directed by your health care provider.  If your procedure was done as an outpatient procedure, which means that you went home the same day as your procedure, a responsible adult should be with you for the first 24 hours after you arrive home.  Keep all follow-up visits as directed by your health care provider. This is important. Contact a health care provider if:  You have a fever.  You have chills.  You have increased bleeding from the site. Hold pressure on the site. Get help right away if:  You have unusual pain at the site.  You have redness, warmth, or swelling at the site.  You have drainage (other than a small amount of blood on the dressing) from the site.  The site is bleeding, and the bleeding does not stop after 30 minutes of holding steady pressure on the site.  Your leg or foot becomes pale, cool, tingly, or numb. This information is not intended to replace advice given to you by your health care provider. Make sure you discuss any questions you have with your health care provider. Document Released: 02/05/2014 Document Revised: 11/10/2015 Document Reviewed: 12/22/2013 Elsevier Interactive Patient Education  [employer].

## 2017-10-30 ENCOUNTER — Encounter (HOSPITAL_COMMUNITY): Payer: Self-pay | Admitting: Neurosurgery

## 2018-02-20 ENCOUNTER — Other Ambulatory Visit: Payer: Self-pay | Admitting: Internal Medicine

## 2018-06-09 ENCOUNTER — Telehealth: Payer: Self-pay

## 2018-06-09 NOTE — Telephone Encounter (Signed)
I got his home voice mail and left him a detailed message that Dr Carlean Purl wanted me to check on him. As soon as we get the February office dates I'll call him back and set up an appointment.

## 2018-06-09 NOTE — Telephone Encounter (Signed)
-----   Message from Gatha Mayer, MD sent at 06/06/2018  6:27 PM EST ----- Regarding: Patient to call Not seen in 18 mos  Check on him  Return visit please

## 2018-06-12 NOTE — Telephone Encounter (Signed)
I called and left him a message that February dates are open for him to call us and set up an appointment.

## 2018-06-23 NOTE — Telephone Encounter (Signed)
Left [name] another message to call and set up office visit.

## 2018-06-25 NOTE — Telephone Encounter (Signed)
[name] called back and set up appointment for Feb. 7th at 8:30AM.

## 2018-06-29 ENCOUNTER — Other Ambulatory Visit: Payer: Self-pay | Admitting: Internal Medicine

## 2018-07-25 ENCOUNTER — Ambulatory Visit: Payer: BLUE CROSS/BLUE SHIELD | Admitting: Internal Medicine

## 2018-08-20 ENCOUNTER — Encounter: Payer: Self-pay | Admitting: Internal Medicine

## 2018-08-20 ENCOUNTER — Other Ambulatory Visit (INDEPENDENT_AMBULATORY_CARE_PROVIDER_SITE_OTHER): Payer: BLUE CROSS/BLUE SHIELD

## 2018-08-20 ENCOUNTER — Ambulatory Visit: Payer: BLUE CROSS/BLUE SHIELD | Admitting: Internal Medicine

## 2018-08-20 DIAGNOSIS — Z7960 Long term (current) use of unspecified immunomodulators and immunosuppressants: Secondary | ICD-10-CM

## 2018-08-20 DIAGNOSIS — K5100 Ulcerative (chronic) pancolitis without complications: Secondary | ICD-10-CM

## 2018-08-20 DIAGNOSIS — Z79899 Other long term (current) drug therapy: Secondary | ICD-10-CM

## 2018-08-20 LAB — CBC WITH DIFFERENTIAL/PLATELET
BASOS PCT: 0.9 % (ref 0.0–3.0)
Basophils Absolute: 0.1 10*3/uL (ref 0.0–0.1)
EOS ABS: 0.2 10*3/uL (ref 0.0–0.7)
EOS PCT: 2.1 % (ref 0.0–5.0)
HCT: 41.3 % (ref 39.0–52.0)
HEMOGLOBIN: 13.9 g/dL (ref 13.0–17.0)
LYMPHS ABS: 1.6 10*3/uL (ref 0.7–4.0)
Lymphocytes Relative: 18.7 % (ref 12.0–46.0)
MCHC: 33.6 g/dL (ref 30.0–36.0)
MCV: 95.4 fl (ref 78.0–100.0)
Monocytes Absolute: 0.8 10*3/uL (ref 0.1–1.0)
Monocytes Relative: 9.3 % (ref 3.0–12.0)
NEUTROS ABS: 6.1 10*3/uL (ref 1.4–7.7)
Neutrophils Relative %: 69 % (ref 43.0–77.0)
PLATELETS: 383 10*3/uL (ref 150.0–400.0)
RBC: 4.32 Mil/uL (ref 4.22–5.81)
RDW: 15.3 % (ref 11.5–15.5)
WBC: 8.8 10*3/uL (ref 4.0–10.5)

## 2018-08-20 LAB — COMPREHENSIVE METABOLIC PANEL
ALBUMIN: 4.2 g/dL (ref 3.5–5.2)
ALT: 9 U/L (ref 0–53)
AST: 14 U/L (ref 0–37)
Alkaline Phosphatase: 69 U/L (ref 39–117)
BUN: 36 mg/dL — AB (ref 6–23)
CO2: 31 meq/L (ref 19–32)
CREATININE: 1.56 mg/dL — AB (ref 0.40–1.50)
Calcium: 10.3 mg/dL (ref 8.4–10.5)
Chloride: 103 mEq/L (ref 96–112)
GFR: 43.84 mL/min — ABNORMAL LOW (ref >60.00)
GLUCOSE: 107 mg/dL — AB (ref 70–99)
POTASSIUM: 4.6 meq/L (ref 3.5–5.1)
SODIUM: 142 meq/L (ref 135–145)
Total Bilirubin: 0.5 mg/dL (ref 0.2–1.2)
Total Protein: 6.8 g/dL (ref 6.0–8.3)

## 2018-08-20 NOTE — Progress Notes (Signed)
Sean Pope 73 y.o. 1945/12/17 [id number]  Assessment & Plan:  Ulcerative chronic pancolitis ([place]) Labs now and qurterly (CBC, LFT) Stay on 6 MP 50 mg qd Next colonoscopy would be needed 2022 at latest (8 yrs post dx) RTC 1 year  Long-term use of immunosuppressant medication-mercaptopurine  Labs Annual Derm  JS:[id number], Serina Cowper, MD Dr. Elmarie Shiley  Lab Results  Component Value Date   WBC 8.8 08/20/2018   HGB 13.9 08/20/2018   HCT 41.3 08/20/2018   MCV 95.4 08/20/2018   PLT 383.0 08/20/2018   Lab Results  Component Value Date   ALT 9 08/20/2018   AST 14 08/20/2018   ALKPHOS 69 08/20/2018   BILITOT 0.5 08/20/2018   Lab Results  Component Value Date   CREATININE 1.56 (H) 08/20/2018   BUN 36 (H) 08/20/2018   NA 142 08/20/2018   K 4.6 08/20/2018   CL 103 08/20/2018   CO2 31 08/20/2018     Subjective:   Chief Complaint: f/u UC  HPI Sean Pope is here reporting that he is doing well - on 6 MP for UC at 50 mg qd. Not seen in 1+ years. Last 11/2016 No sig diarrhea  He stopped Entyvio by choice in 2018  Weight stable  Wt Readings from Last 3 Encounters:  08/20/18 155 lb (70.3 kg)  10/22/17 151 lb (68.5 kg)  12/06/16 154 lb 9.6 oz (70.1 kg)   Still working at [employer] home.   No Known Allergies Current Meds  Medication Sig  . calcium carbonate (CALCIUM 600) 600 MG TABS tablet Take 600 mg by mouth daily.  . Cholecalciferol (VITAMIN D3) 1000 units CAPS Take 1 capsule by mouth daily.  . Ferrous Sulfate (IRON) 325 (65 FE) MG TABS Take 1 tablet by mouth 2 (two) times daily.   . furosemide (LASIX) 40 MG tablet Take 2 tablets (80 mg total) by mouth daily. (Patient taking differently: Take 40 mg by mouth daily. PT TAKING 1/2 of 68m daily)  . mercaptopurine (PURINETHOL) 50 MG tablet 50 mg daily Give on an empty stomach 1 hour before or 2 hours after meals. Caution: Chemotherapy.  . Multiple Vitamins-Minerals (MENS MULTIVITAMIN PLUS PO) Take 1 tablet by mouth.  Centrum Silver  . vitamin B-12 (CYANOCOBALAMIN) 1000 MCG tablet Take 1,000 mcg by mouth daily.   Past Medical History:  Diagnosis Date  . Acute respiratory failure with hypoxia ([facility] 09/17/2011  . AKI (acute kidney injury) ([place]    In the setting of ulcerative colitis flare, question relationship to mesalamine  . Anal fissure - posterior 01/22/2014  . Anemia-multifactorial 01/05/2014   Blood loss, medications, chronic illness kidney injury  . B12 deficiency - borderline 12/21/2014  . C. difficile colitis 06/02/2014  . Chronic kidney disease, stage III (moderate) ([place] 09/08/2013  . Hyperlipidemia   . Hypertension   . Long-term use of immunosuppressant medication-mercaptopurine and Humira 01/05/2014  . Lung collapse 09/26/2011  . Osteoporosis 06/29/2014   DEXA 06/2014 - lowest t score -4.2  . 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 Protein-calorie malnutrition, severe ([facility] 10/26/2013  . Respiratory failure, acute ([place] 01/07/2014  . SAH (subarachnoid hemorrhage) ([place]   . Stroke ([place]   . Ulcerative colitis ([place] 05/28/2013  . Vitamin D deficiency 06/03/2014   Past Surgical History:  Procedure Laterality Date  . ANEURYSM COILING  2013   Posterior communicating  . CATARACT EXTRACTION Bilateral aug, sept 2016  . COLONOSCOPY W/ BIOPSIES  05/28/2013  . IR ANGIO INTRA EXTRACRAN SEL INTERNAL CAROTID  BILAT MOD SED  10/22/2017  . IR ANGIO VERTEBRAL SEL VERTEBRAL UNI L MOD SED  10/22/2017   Social History   Social History Narrative   Married, 1 son and 1 daughter   TEFL teacher   1.5 caffeine drinks daily   family history includes Heart disease in his mother.   Review of Systems   Objective:   Physical Exam @BP  (!) 150/90   Pulse 76   Ht 5' 10"  (1.778 m)   Wt 155 lb (70.3 kg)   BMI 22.24 kg/m @  General:  NAD Eyes:   anicteric Lungs:  clear Heart::  S1S2 no rubs, murmurs or gallops Abdomen:  soft and nontender, BS+     Data Reviewed:    Labs, GI notes  15 minutes time spent with patient > half in counseling coordination of care

## 2018-08-20 NOTE — Assessment & Plan Note (Signed)
Labs Annual Derm

## 2018-08-20 NOTE — Patient Instructions (Signed)
  Your provider has requested that you go to the basement level for lab work before leaving today. Press "B" on the elevator. The lab is located at the first door on the left as you exit the elevator.   Make sure and get a dermatology visit yearly.   I appreciate the opportunity to care for you. Silvano Rusk, MD, [facility]

## 2018-08-20 NOTE — Assessment & Plan Note (Addendum)
Labs now and qurterly (CBC, LFT) Stay on 6 MP 50 mg qd Next colonoscopy would be needed 2022 at latest (8 yrs post dx) RTC 1 year

## 2018-08-21 NOTE — Progress Notes (Signed)
Labs are ok  Kidney fx stable - slightly better NOT worse  Please have him do CBC and LFT in 3 months please due to 6 MP use  We need to fax a copy to Dr. Elmarie Shiley of Nephrology also

## 2018-08-22 ENCOUNTER — Other Ambulatory Visit: Payer: Self-pay

## 2018-08-22 DIAGNOSIS — K5100 Ulcerative (chronic) pancolitis without complications: Secondary | ICD-10-CM

## 2018-12-05 ENCOUNTER — Other Ambulatory Visit: Payer: Self-pay | Admitting: Internal Medicine

## 2018-12-05 ENCOUNTER — Telehealth: Payer: Self-pay | Admitting: Internal Medicine

## 2018-12-05 MED ORDER — MERCAPTOPURINE 50 MG PO TABS: ORAL_TABLET | ORAL | 2 refills | Status: DC

## 2018-12-05 NOTE — Telephone Encounter (Signed)
Prescription for mercaptopurine resent to patient's pharmacy.

## 2018-12-05 NOTE — Telephone Encounter (Signed)
Pt requested a refill for mercaptopurine.

## 2018-12-05 NOTE — Telephone Encounter (Signed)
Patient  Called in and stated pharmacy is still telling him that prescription has not been called in

## 2019-04-08 DIAGNOSIS — N183 Chronic kidney disease, stage 3 unspecified: Secondary | ICD-10-CM | POA: Diagnosis not present

## 2019-04-13 DIAGNOSIS — N183 Chronic kidney disease, stage 3 unspecified: Secondary | ICD-10-CM | POA: Diagnosis not present

## 2019-04-13 DIAGNOSIS — N2581 Secondary hyperparathyroidism of renal origin: Secondary | ICD-10-CM | POA: Diagnosis not present

## 2019-04-13 DIAGNOSIS — I129 Hypertensive chronic kidney disease with stage 1 through stage 4 chronic kidney disease, or unspecified chronic kidney disease: Secondary | ICD-10-CM | POA: Diagnosis not present

## 2019-05-13 ENCOUNTER — Other Ambulatory Visit: Payer: Self-pay

## 2019-05-13 DIAGNOSIS — Z20822 Contact with and (suspected) exposure to covid-19: Secondary | ICD-10-CM

## 2019-05-14 LAB — NOVEL CORONAVIRUS, NAA: SARS-CoV-2, NAA: NOT DETECTED

## 2019-09-14 ENCOUNTER — Telehealth: Payer: Self-pay | Admitting: Internal Medicine

## 2019-09-14 MED ORDER — MERCAPTOPURINE 50 MG PO TABS: ORAL_TABLET | ORAL | 2 refills | Status: DC

## 2019-09-14 NOTE — Telephone Encounter (Signed)
Pt requested a refill for mercaptopurine.

## 2019-09-14 NOTE — Telephone Encounter (Signed)
I spoke with Sean Pope and made him a yearly follow up appointment and sent in his refill as requested.

## 2019-10-07 ENCOUNTER — Other Ambulatory Visit: Payer: Self-pay | Admitting: Internal Medicine

## 2019-10-07 ENCOUNTER — Encounter: Payer: Self-pay | Admitting: Internal Medicine

## 2019-10-07 ENCOUNTER — Other Ambulatory Visit (INDEPENDENT_AMBULATORY_CARE_PROVIDER_SITE_OTHER): Payer: PPO

## 2019-10-07 ENCOUNTER — Encounter (INDEPENDENT_AMBULATORY_CARE_PROVIDER_SITE_OTHER): Payer: Self-pay

## 2019-10-07 ENCOUNTER — Ambulatory Visit: Payer: PPO | Admitting: Internal Medicine

## 2019-10-07 VITALS — BP 144/82 | HR 60 | Temp 97.5°F | Ht 70.0 in | Wt 152.6 lb

## 2019-10-07 DIAGNOSIS — K5100 Ulcerative (chronic) pancolitis without complications: Secondary | ICD-10-CM | POA: Diagnosis not present

## 2019-10-07 DIAGNOSIS — Z79899 Other long term (current) drug therapy: Secondary | ICD-10-CM

## 2019-10-07 DIAGNOSIS — Z7960 Long term (current) use of unspecified immunomodulators and immunosuppressants: Secondary | ICD-10-CM

## 2019-10-07 LAB — COMPREHENSIVE METABOLIC PANEL
ALT: 9 U/L (ref 0–53)
AST: 14 U/L (ref 0–37)
Albumin: 4.3 g/dL (ref 3.5–5.2)
Alkaline Phosphatase: 67 U/L (ref 39–117)
BUN: 32 mg/dL — ABNORMAL HIGH (ref 6–23)
CO2: 32 mEq/L (ref 19–32)
Calcium: 10 mg/dL (ref 8.4–10.5)
Chloride: 99 mEq/L (ref 96–112)
Creatinine, Ser: 1.66 mg/dL — ABNORMAL HIGH (ref 0.40–1.50)
GFR: 40.68 mL/min — ABNORMAL LOW (ref >60.00)
Glucose, Bld: 114 mg/dL — ABNORMAL HIGH (ref 70–99)
Potassium: 4.1 mEq/L (ref 3.5–5.1)
Sodium: 139 mEq/L (ref 135–145)
Total Bilirubin: 0.7 mg/dL (ref 0.2–1.2)
Total Protein: 7.1 g/dL (ref 6.0–8.3)

## 2019-10-07 NOTE — Assessment & Plan Note (Addendum)
Doing well without problems.  He self directed treatment and lowered the dose of his 6-MP.  I will check thio purine metabolites today as well as CBC and CMET.  Return to clinic in 1 year routinely.  Sooner as needed.  We had a discussion about needing routine labs. Colonoscopy next year most likely which will be about 8 years after diagnosis

## 2019-10-07 NOTE — Patient Instructions (Addendum)
Your provider has requested that you go to the basement level for lab work before leaving today. Press "B" on the elevator. The lab is located at the first door on the left as you exit the elevator.  If you are age 74 or older, your body mass index should be between 23-30. Your Body mass index is 21.9 kg/m. If this is out of the aforementioned range listed, please consider follow up with your Primary Care Provider.  If you are age 55 or younger, your body mass index should be between 19-25. Your Body mass index is 21.9 kg/m. If this is out of the aformentioned range listed, please consider follow up with your Primary Care Provider.   [facility] - [phone]

## 2019-10-07 NOTE — Assessment & Plan Note (Signed)
No toxicity problems that we know of.  Recheck labs today thio purine metabolites.  I need to get him on a plan to come for labs 3-4 times a year.  We reviewed the necessity of that today.  Dermatology annual visit recommended we gave him the name and number of dermatology group.  His previous dermatologist retired.  He has limited sun exposure.

## 2019-10-07 NOTE — Progress Notes (Signed)
Sean Pope 74 y.o. 03/04/1946 [id number]  Assessment & Plan:  Ulcerative chronic pancolitis ([place]) Doing well without problems.  He self directed treatment and lowered the dose of his 6-MP.  I will check thio purine metabolites today as well as CBC and CMET.  Return to clinic in 1 year routinely.  Sooner as needed.  We had a discussion about needing routine labs. Colonoscopy next year most likely which will be about 8 years after diagnosis  Long-term use of immunosuppressant medication-mercaptopurine  No toxicity problems that we know of.  Recheck labs today thio purine metabolites.  I need to get him on a plan to come for labs 3-4 times a year.  We reviewed the necessity of that today.  Dermatology annual visit recommended we gave him the name and number of dermatology group.  His previous dermatologist retired.  He has limited sun exposure.    I appreciate the opportunity to care for this patient. CC: Tenna Child, MD   Subjective:   Chief Complaint: Follow-up of ulcerative colitis  HPI Sean Pope is here for follow-up of his pan ulcerative colitis currently being treated with 6-mercaptopurine.  He was on Entyvio but he stopped that by choice in 2018.  He reports that he is doing well.  Last visit here 09/05/2018.  Last labs I have are October 2021 white count hemoglobin and platelets were normal as part of the renal follow-up.  Retired in September - is working prn No Known Allergies Current Meds  Medication Sig  . calcium carbonate (CALCIUM 600) 600 MG TABS tablet Take 600 mg by mouth daily.  . Cholecalciferol (VITAMIN D3) 1000 units CAPS Take 1 capsule by mouth daily.  . Ferrous Sulfate (IRON) 325 (65 FE) MG TABS Take 1 tablet by mouth 2 (two) times daily.   . Multiple Vitamins-Minerals (MENS MULTIVITAMIN PLUS PO) Take 1 tablet by mouth. Centrum Silver  . vitamin B-12 (CYANOCOBALAMIN) 1000 MCG tablet Take 1,000 mcg by mouth daily.   Past Medical History:  Diagnosis  Date  . Acute respiratory failure with hypoxia ([place]) 09/17/2011  . AKI (acute kidney injury) ([place])    In the setting of ulcerative colitis flare, question relationship to mesalamine  . Anal fissure - posterior 01/22/2014  . Anemia-multifactorial 01/05/2014   Blood loss, medications, chronic illness kidney injury  . B12 deficiency - borderline 12/21/2014  . C. difficile colitis 06/02/2014  . Chronic kidney disease, stage III (moderate) 09/08/2013  . Hyperlipidemia   . Hypertension   . Long-term use of immunosuppressant medication-mercaptopurine and Humira 01/05/2014  . Lung collapse 09/26/2011  . Osteoporosis 06/29/2014   DEXA 06/2014 - lowest t score -4.2  . 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 Protein-calorie malnutrition, severe ([place]) 10/26/2013  . Respiratory failure, acute ([place]) 01/07/2014  . SAH (subarachnoid hemorrhage) ([place])   . Stroke ([place])   . Ulcerative colitis ([place]) 05/28/2013  . Vitamin D deficiency 06/03/2014   Past Surgical History:  Procedure Laterality Date  . ANEURYSM COILING  2013   Posterior communicating  . CATARACT EXTRACTION Bilateral aug, sept 2016  . COLONOSCOPY W/ BIOPSIES  05/28/2013  . IR ANGIO INTRA EXTRACRAN SEL INTERNAL CAROTID BILAT MOD SED  10/22/2017  . IR ANGIO VERTEBRAL SEL VERTEBRAL UNI L MOD SED  10/22/2017   Social History   Social History Narrative   Married, 1 son and 1 daughter   TEFL teacher   1.5 caffeine drinks daily   family history includes Heart disease in  his mother.   Review of Systems As above  Objective:   Physical Exam BP (!) 144/82   Pulse 60   Temp (!) 97.5 F (36.4 C)   Ht 5' 10"  (1.778 m)   Wt 152 lb 9.6 oz (69.2 kg)   BMI 21.90 kg/m  Thin well-developed well-nourished no acute distress

## 2019-10-08 LAB — CBC WITH DIFFERENTIAL/PLATELET
Basophils Absolute: 0.1 10*3/uL (ref 0.0–0.1)
Basophils Relative: 1 % (ref 0.0–3.0)
Eosinophils Absolute: 0.2 10*3/uL (ref 0.0–0.7)
Eosinophils Relative: 2.2 % (ref 0.0–5.0)
HCT: 43 % (ref 39.0–52.0)
Hemoglobin: 14.2 g/dL (ref 13.0–17.0)
Lymphocytes Relative: 16.8 % (ref 12.0–46.0)
Lymphs Abs: 1.5 10*3/uL (ref 0.7–4.0)
MCHC: 33 g/dL (ref 30.0–36.0)
MCV: 99.1 fl (ref 78.0–100.0)
Monocytes Absolute: 0.6 10*3/uL (ref 0.1–1.0)
Monocytes Relative: 7.1 % (ref 3.0–12.0)
Neutro Abs: 6.4 10*3/uL (ref 1.4–7.7)
Neutrophils Relative %: 72.9 % (ref 43.0–77.0)
Platelets: 338 10*3/uL (ref 150.0–400.0)
RBC: 4.34 Mil/uL (ref 4.22–5.81)
RDW: 16.1 % — ABNORMAL HIGH (ref 11.5–15.5)
WBC: 8.8 10*3/uL (ref 4.0–10.5)

## 2019-10-13 LAB — THIOPURINE METHYLTRANSFERASE (TPMT), RBC: TPMT Activity:: 26.5 Units/mL RBC

## 2019-10-13 LAB — SPECIMEN STATUS REPORT

## 2019-10-14 ENCOUNTER — Other Ambulatory Visit: Payer: Self-pay | Admitting: Internal Medicine

## 2019-10-14 DIAGNOSIS — Z79899 Other long term (current) drug therapy: Secondary | ICD-10-CM

## 2019-10-14 DIAGNOSIS — K5100 Ulcerative (chronic) pancolitis without complications: Secondary | ICD-10-CM

## 2019-10-14 DIAGNOSIS — Z7960 Long term (current) use of unspecified immunomodulators and immunosuppressants: Secondary | ICD-10-CM

## 2019-10-15 ENCOUNTER — Other Ambulatory Visit: Payer: PPO

## 2019-10-15 DIAGNOSIS — Z79899 Other long term (current) drug therapy: Secondary | ICD-10-CM

## 2019-10-15 DIAGNOSIS — Z7960 Long term (current) use of unspecified immunomodulators and immunosuppressants: Secondary | ICD-10-CM

## 2019-10-15 DIAGNOSIS — K5100 Ulcerative (chronic) pancolitis without complications: Secondary | ICD-10-CM

## 2019-10-22 LAB — THIOPURINE METABOLITES
6 MMP(6-Methylmercaptopurine): 763 pmol/8x10(8)RBC (ref <5700)
6 TG(6-Thioguanine): 93 pmol/8x10(8)RBC — ABNORMAL LOW (ref 235–400)

## 2019-10-23 ENCOUNTER — Other Ambulatory Visit: Payer: Self-pay | Admitting: Internal Medicine

## 2019-10-23 DIAGNOSIS — Z7960 Long term (current) use of unspecified immunomodulators and immunosuppressants: Secondary | ICD-10-CM

## 2019-10-23 DIAGNOSIS — K5100 Ulcerative (chronic) pancolitis without complications: Secondary | ICD-10-CM

## 2019-10-23 DIAGNOSIS — Z79899 Other long term (current) drug therapy: Secondary | ICD-10-CM

## 2019-12-20 ENCOUNTER — Other Ambulatory Visit: Payer: Self-pay | Admitting: Internal Medicine

## 2019-12-29 ENCOUNTER — Telehealth: Payer: Self-pay | Admitting: Internal Medicine

## 2019-12-29 NOTE — Telephone Encounter (Signed)
Left a message for him to call me back.

## 2019-12-29 NOTE — Telephone Encounter (Signed)
Spoke to [facility] and we confirmed the dose of his 6MP. I had refilled it earlier today and it came thru at one a day. He is actually on 1.5 daily.

## 2020-01-22 ENCOUNTER — Other Ambulatory Visit (INDEPENDENT_AMBULATORY_CARE_PROVIDER_SITE_OTHER): Payer: PPO

## 2020-01-22 DIAGNOSIS — Z7960 Long term (current) use of unspecified immunomodulators and immunosuppressants: Secondary | ICD-10-CM

## 2020-01-22 DIAGNOSIS — Z79899 Other long term (current) drug therapy: Secondary | ICD-10-CM | POA: Diagnosis not present

## 2020-01-22 DIAGNOSIS — K5100 Ulcerative (chronic) pancolitis without complications: Secondary | ICD-10-CM

## 2020-01-22 LAB — CBC WITH DIFFERENTIAL/PLATELET
Basophils Absolute: 0.1 10*3/uL (ref 0.0–0.1)
Basophils Relative: 0.7 % (ref 0.0–3.0)
Eosinophils Absolute: 0.2 10*3/uL (ref 0.0–0.7)
Eosinophils Relative: 3 % (ref 0.0–5.0)
HCT: 33.6 % — ABNORMAL LOW (ref 39.0–52.0)
Hemoglobin: 11.4 g/dL — ABNORMAL LOW (ref 13.0–17.0)
Lymphocytes Relative: 19.8 % (ref 12.0–46.0)
Lymphs Abs: 1.5 10*3/uL (ref 0.7–4.0)
MCHC: 34 g/dL (ref 30.0–36.0)
MCV: 101 fl — ABNORMAL HIGH (ref 78.0–100.0)
Monocytes Absolute: 0.7 10*3/uL (ref 0.1–1.0)
Monocytes Relative: 9.5 % (ref 3.0–12.0)
Neutro Abs: 5.2 10*3/uL (ref 1.4–7.7)
Neutrophils Relative %: 67 % (ref 43.0–77.0)
Platelets: 385 10*3/uL (ref 150.0–400.0)
RBC: 3.33 Mil/uL — ABNORMAL LOW (ref 4.22–5.81)
RDW: 21.2 % — ABNORMAL HIGH (ref 11.5–15.5)
WBC: 7.7 10*3/uL (ref 4.0–10.5)

## 2020-01-22 LAB — HEPATIC FUNCTION PANEL
ALT: 9 U/L (ref 0–53)
AST: 13 U/L (ref 0–37)
Albumin: 4.2 g/dL (ref 3.5–5.2)
Alkaline Phosphatase: 62 U/L (ref 39–117)
Bilirubin, Direct: 0.1 mg/dL (ref 0.0–0.3)
Total Bilirubin: 0.7 mg/dL (ref 0.2–1.2)
Total Protein: 7.1 g/dL (ref 6.0–8.3)

## 2020-01-25 ENCOUNTER — Other Ambulatory Visit: Payer: Self-pay

## 2020-01-25 DIAGNOSIS — Z7960 Long term (current) use of unspecified immunomodulators and immunosuppressants: Secondary | ICD-10-CM

## 2020-01-27 ENCOUNTER — Other Ambulatory Visit: Payer: Self-pay

## 2020-01-27 DIAGNOSIS — Z7960 Long term (current) use of unspecified immunomodulators and immunosuppressants: Secondary | ICD-10-CM

## 2020-02-01 ENCOUNTER — Other Ambulatory Visit: Payer: PPO

## 2020-02-01 DIAGNOSIS — Z79899 Other long term (current) drug therapy: Secondary | ICD-10-CM | POA: Diagnosis not present

## 2020-02-01 DIAGNOSIS — Z7960 Long term (current) use of unspecified immunomodulators and immunosuppressants: Secondary | ICD-10-CM

## 2020-02-06 LAB — THIOPURINE METABOLITES
6 MMP(6-Methylmercaptopurine): 1831 pmol/8x10(8)RBC (ref <5700)
6 TG(6-Thioguanine): 150 pmol/8x10(8)RBC — ABNORMAL LOW (ref 235–400)

## 2020-02-08 ENCOUNTER — Telehealth: Payer: Self-pay | Admitting: Internal Medicine

## 2020-02-08 DIAGNOSIS — Z7960 Long term (current) use of unspecified immunomodulators and immunosuppressants: Secondary | ICD-10-CM

## 2020-02-08 DIAGNOSIS — K5100 Ulcerative (chronic) pancolitis without complications: Secondary | ICD-10-CM

## 2020-02-08 NOTE — Telephone Encounter (Signed)
Pt is requesting a call back from [facility] regarding his lab results to his cell phones

## 2020-02-08 NOTE — Telephone Encounter (Signed)
I spoke to Sean Pope and informed him of his results. New lab orders entered into epic. I will fax a copy of his recent labs to [facility] , ATT: Dr Elmarie Shiley per his request.

## 2020-02-19 ENCOUNTER — Other Ambulatory Visit: Payer: Self-pay | Admitting: Internal Medicine

## 2020-02-25 ENCOUNTER — Telehealth: Payer: Self-pay | Admitting: Internal Medicine

## 2020-02-25 NOTE — Telephone Encounter (Signed)
Sean Pope wanted me to fax his recent blood work to Dr Posey Pronto for his office visit tomorrow. I did this for him.

## 2020-02-25 NOTE — Telephone Encounter (Signed)
Pt called asking to speak with you, he did not provide more details just said that it is related to his blood work and you will know what he is talking about. Pls call him.

## 2020-02-26 DIAGNOSIS — I129 Hypertensive chronic kidney disease with stage 1 through stage 4 chronic kidney disease, or unspecified chronic kidney disease: Secondary | ICD-10-CM | POA: Diagnosis not present

## 2020-02-26 DIAGNOSIS — N183 Chronic kidney disease, stage 3 unspecified: Secondary | ICD-10-CM | POA: Diagnosis not present

## 2020-02-26 DIAGNOSIS — N2581 Secondary hyperparathyroidism of renal origin: Secondary | ICD-10-CM | POA: Diagnosis not present

## 2020-03-04 ENCOUNTER — Telehealth: Payer: Self-pay | Admitting: Internal Medicine

## 2020-03-04 NOTE — Telephone Encounter (Signed)
Patient's questions answered and I called [facility] and got them to run his rx thru at the new dosage of 1.5 tablets on his mercaptopurine.

## 2020-09-29 DIAGNOSIS — N183 Chronic kidney disease, stage 3 unspecified: Secondary | ICD-10-CM | POA: Diagnosis not present

## 2020-10-06 DIAGNOSIS — N183 Chronic kidney disease, stage 3 unspecified: Secondary | ICD-10-CM | POA: Diagnosis not present

## 2020-10-06 DIAGNOSIS — N2581 Secondary hyperparathyroidism of renal origin: Secondary | ICD-10-CM | POA: Diagnosis not present

## 2020-10-06 DIAGNOSIS — I129 Hypertensive chronic kidney disease with stage 1 through stage 4 chronic kidney disease, or unspecified chronic kidney disease: Secondary | ICD-10-CM | POA: Diagnosis not present

## 2020-10-18 DIAGNOSIS — Z87891 Personal history of nicotine dependence: Secondary | ICD-10-CM | POA: Diagnosis not present

## 2020-10-18 DIAGNOSIS — R059 Cough, unspecified: Secondary | ICD-10-CM | POA: Diagnosis not present

## 2020-10-18 DIAGNOSIS — N183 Chronic kidney disease, stage 3 unspecified: Secondary | ICD-10-CM | POA: Diagnosis not present

## 2020-10-18 DIAGNOSIS — K529 Noninfective gastroenteritis and colitis, unspecified: Secondary | ICD-10-CM | POA: Diagnosis not present

## 2020-11-01 ENCOUNTER — Other Ambulatory Visit: Payer: Self-pay | Admitting: Neurosurgery

## 2020-11-01 DIAGNOSIS — I6031 Nontraumatic subarachnoid hemorrhage from right posterior communicating artery: Secondary | ICD-10-CM

## 2020-11-15 ENCOUNTER — Encounter (HOSPITAL_BASED_OUTPATIENT_CLINIC_OR_DEPARTMENT_OTHER): Payer: Self-pay | Admitting: Emergency Medicine

## 2020-11-15 ENCOUNTER — Inpatient Hospital Stay (HOSPITAL_BASED_OUTPATIENT_CLINIC_OR_DEPARTMENT_OTHER)
Admission: EM | Admit: 2020-11-15 | Discharge: 2020-11-19 | DRG: 177 | Disposition: A | Discharge status: Home / Self Care | Payer: PPO | Attending: Internal Medicine | Admitting: Internal Medicine

## 2020-11-15 ENCOUNTER — Emergency Department (HOSPITAL_BASED_OUTPATIENT_CLINIC_OR_DEPARTMENT_OTHER): Payer: PPO

## 2020-11-15 ENCOUNTER — Other Ambulatory Visit: Payer: Self-pay

## 2020-11-15 DIAGNOSIS — U071 COVID-19: Principal | ICD-10-CM | POA: Diagnosis present

## 2020-11-15 DIAGNOSIS — Z7960 Long term (current) use of unspecified immunomodulators and immunosuppressants: Secondary | ICD-10-CM

## 2020-11-15 DIAGNOSIS — J439 Emphysema, unspecified: Secondary | ICD-10-CM | POA: Diagnosis present

## 2020-11-15 DIAGNOSIS — J9602 Acute respiratory failure with hypercapnia: Secondary | ICD-10-CM | POA: Diagnosis present

## 2020-11-15 DIAGNOSIS — N184 Chronic kidney disease, stage 4 (severe): Secondary | ICD-10-CM | POA: Diagnosis present

## 2020-11-15 DIAGNOSIS — J9622 Acute and chronic respiratory failure with hypercapnia: Secondary | ICD-10-CM | POA: Diagnosis not present

## 2020-11-15 DIAGNOSIS — E872 Acidosis: Secondary | ICD-10-CM | POA: Diagnosis present

## 2020-11-15 DIAGNOSIS — M810 Age-related osteoporosis without current pathological fracture: Secondary | ICD-10-CM | POA: Diagnosis present

## 2020-11-15 DIAGNOSIS — I129 Hypertensive chronic kidney disease with stage 1 through stage 4 chronic kidney disease, or unspecified chronic kidney disease: Secondary | ICD-10-CM | POA: Diagnosis not present

## 2020-11-15 DIAGNOSIS — J432 Centrilobular emphysema: Secondary | ICD-10-CM | POA: Diagnosis not present

## 2020-11-15 DIAGNOSIS — J1282 Pneumonia due to coronavirus disease 2019: Secondary | ICD-10-CM | POA: Diagnosis present

## 2020-11-15 DIAGNOSIS — Z79899 Other long term (current) drug therapy: Secondary | ICD-10-CM

## 2020-11-15 DIAGNOSIS — J9621 Acute and chronic respiratory failure with hypoxia: Secondary | ICD-10-CM | POA: Diagnosis present

## 2020-11-15 DIAGNOSIS — E538 Deficiency of other specified B group vitamins: Secondary | ICD-10-CM | POA: Diagnosis present

## 2020-11-15 DIAGNOSIS — E785 Hyperlipidemia, unspecified: Secondary | ICD-10-CM | POA: Diagnosis not present

## 2020-11-15 DIAGNOSIS — Z9981 Dependence on supplemental oxygen: Secondary | ICD-10-CM | POA: Diagnosis not present

## 2020-11-15 DIAGNOSIS — D84821 Immunodeficiency due to drugs: Secondary | ICD-10-CM | POA: Diagnosis not present

## 2020-11-15 DIAGNOSIS — K5190 Ulcerative colitis, unspecified, without complications: Secondary | ICD-10-CM | POA: Diagnosis present

## 2020-11-15 DIAGNOSIS — N183 Chronic kidney disease, stage 3 unspecified: Secondary | ICD-10-CM | POA: Diagnosis present

## 2020-11-15 DIAGNOSIS — E559 Vitamin D deficiency, unspecified: Secondary | ICD-10-CM | POA: Diagnosis not present

## 2020-11-15 DIAGNOSIS — R0602 Shortness of breath: Secondary | ICD-10-CM | POA: Diagnosis not present

## 2020-11-15 DIAGNOSIS — R059 Cough, unspecified: Secondary | ICD-10-CM | POA: Diagnosis not present

## 2020-11-15 DIAGNOSIS — R609 Edema, unspecified: Secondary | ICD-10-CM | POA: Diagnosis not present

## 2020-11-15 DIAGNOSIS — N179 Acute kidney failure, unspecified: Secondary | ICD-10-CM | POA: Diagnosis not present

## 2020-11-15 DIAGNOSIS — N1831 Chronic kidney disease, stage 3a: Secondary | ICD-10-CM | POA: Diagnosis present

## 2020-11-15 DIAGNOSIS — Z87891 Personal history of nicotine dependence: Secondary | ICD-10-CM | POA: Diagnosis not present

## 2020-11-15 DIAGNOSIS — R778 Other specified abnormalities of plasma proteins: Secondary | ICD-10-CM | POA: Diagnosis not present

## 2020-11-15 DIAGNOSIS — D539 Nutritional anemia, unspecified: Secondary | ICD-10-CM | POA: Diagnosis present

## 2020-11-15 DIAGNOSIS — J9601 Acute respiratory failure with hypoxia: Secondary | ICD-10-CM | POA: Diagnosis not present

## 2020-11-15 DIAGNOSIS — R0609 Other forms of dyspnea: Secondary | ICD-10-CM | POA: Diagnosis not present

## 2020-11-15 DIAGNOSIS — Z8615 Personal history of latent tuberculosis infection: Secondary | ICD-10-CM

## 2020-11-15 DIAGNOSIS — J449 Chronic obstructive pulmonary disease, unspecified: Secondary | ICD-10-CM | POA: Diagnosis not present

## 2020-11-15 DIAGNOSIS — R7989 Other specified abnormal findings of blood chemistry: Secondary | ICD-10-CM

## 2020-11-15 DIAGNOSIS — R0600 Dyspnea, unspecified: Secondary | ICD-10-CM | POA: Diagnosis not present

## 2020-11-15 DIAGNOSIS — Z8673 Personal history of transient ischemic attack (TIA), and cerebral infarction without residual deficits: Secondary | ICD-10-CM | POA: Diagnosis not present

## 2020-11-15 DIAGNOSIS — Z8249 Family history of ischemic heart disease and other diseases of the circulatory system: Secondary | ICD-10-CM | POA: Diagnosis not present

## 2020-11-15 HISTORY — DX: COVID-19: U07.1

## 2020-11-15 HISTORY — DX: Pneumonia due to coronavirus disease 2019: J12.82

## 2020-11-15 HISTORY — DX: Acute respiratory failure with hypercapnia: J96.02

## 2020-11-15 HISTORY — DX: Emphysema, unspecified: J43.9

## 2020-11-15 HISTORY — DX: Acute respiratory failure with hypoxia: J96.01

## 2020-11-15 LAB — BLOOD GAS, ARTERIAL
Acid-Base Excess: 0 mmol/L (ref 0.0–2.0)
Bicarbonate: 29 mmol/L — ABNORMAL HIGH (ref 20.0–28.0)
O2 Saturation: 96.9 %
Patient temperature: 98.6
pCO2 arterial: 74.1 mmHg (ref 32.0–48.0)
pH, Arterial: 7.218 — ABNORMAL LOW (ref 7.350–7.450)
pO2, Arterial: 106 mmHg (ref 83.0–108.0)

## 2020-11-15 LAB — CBC
HCT: 37.4 % — ABNORMAL LOW (ref 39.0–52.0)
Hemoglobin: 11.9 g/dL — ABNORMAL LOW (ref 13.0–17.0)
MCH: 35.2 pg — ABNORMAL HIGH (ref 26.0–34.0)
MCHC: 31.8 g/dL (ref 30.0–36.0)
MCV: 110.7 fL — ABNORMAL HIGH (ref 80.0–100.0)
Platelets: 221 10*3/uL (ref 150–400)
RBC: 3.38 MIL/uL — ABNORMAL LOW (ref 4.22–5.81)
RDW: 17.4 % — ABNORMAL HIGH (ref 11.5–15.5)
WBC: 5.9 10*3/uL (ref 4.0–10.5)
nRBC: 0.3 % — ABNORMAL HIGH (ref 0.0–0.2)

## 2020-11-15 LAB — COMPREHENSIVE METABOLIC PANEL
ALT: 15 U/L (ref 0–44)
AST: 26 U/L (ref 15–41)
Albumin: 3.8 g/dL (ref 3.5–5.0)
Alkaline Phosphatase: 49 U/L (ref 38–126)
Anion gap: 7 (ref 5–15)
BUN: 44 mg/dL — ABNORMAL HIGH (ref 8–23)
CO2: 32 mmol/L (ref 22–32)
Calcium: 8.9 mg/dL (ref 8.9–10.3)
Chloride: 104 mmol/L (ref 98–111)
Creatinine, Ser: 2.31 mg/dL — ABNORMAL HIGH (ref 0.61–1.24)
GFR, Estimated: 29 mL/min — ABNORMAL LOW (ref >60)
Glucose, Bld: 100 mg/dL — ABNORMAL HIGH (ref 70–99)
Potassium: 4.2 mmol/L (ref 3.5–5.1)
Sodium: 143 mmol/L (ref 135–145)
Total Bilirubin: 0.4 mg/dL (ref 0.3–1.2)
Total Protein: 6.1 g/dL — ABNORMAL LOW (ref 6.5–8.1)

## 2020-11-15 LAB — LACTIC ACID, PLASMA
Lactic Acid, Venous: 0.5 mmol/L (ref 0.5–1.9)
Lactic Acid, Venous: 0.7 mmol/L (ref 0.5–1.9)

## 2020-11-15 LAB — D-DIMER, QUANTITATIVE: D-Dimer, Quant: 2.45 ug/mL-FEU — ABNORMAL HIGH (ref 0.00–0.50)

## 2020-11-15 LAB — TRIGLYCERIDES: Triglycerides: 112 mg/dL (ref <150)

## 2020-11-15 LAB — RESP PANEL BY RT-PCR (FLU A&B, COVID) ARPGX2
Influenza A by PCR: NEGATIVE
Influenza B by PCR: NEGATIVE
SARS Coronavirus 2 by RT PCR: POSITIVE — AB

## 2020-11-15 LAB — C-REACTIVE PROTEIN: CRP: 1.9 mg/dL — ABNORMAL HIGH (ref <1.0)

## 2020-11-15 LAB — TROPONIN I (HIGH SENSITIVITY)
Troponin I (High Sensitivity): 1105 ng/L (ref <18)
Troponin I (High Sensitivity): 1139 ng/L (ref <18)

## 2020-11-15 LAB — BRAIN NATRIURETIC PEPTIDE: B Natriuretic Peptide: 253.8 pg/mL — ABNORMAL HIGH (ref 0.0–100.0)

## 2020-11-15 LAB — PROCALCITONIN: Procalcitonin: 0.1 ng/mL

## 2020-11-15 LAB — FIBRINOGEN: Fibrinogen: 463 mg/dL (ref 210–475)

## 2020-11-15 LAB — MRSA PCR SCREENING: MRSA by PCR: NEGATIVE

## 2020-11-15 LAB — FERRITIN: Ferritin: 158 ng/mL (ref 24–336)

## 2020-11-15 LAB — LACTATE DEHYDROGENASE: LDH: 181 U/L (ref 98–192)

## 2020-11-15 MED ORDER — SODIUM CHLORIDE 0.9 % IV SOLN
100.0000 mg | INTRAVENOUS | Status: AC
  Administered 2020-11-15 (×2): 100 mg via INTRAVENOUS

## 2020-11-15 MED ORDER — DEXAMETHASONE SODIUM PHOSPHATE 10 MG/ML IJ SOLN
6.0000 mg | INTRAMUSCULAR | Status: DC
  Administered 2020-11-15 – 2020-11-17 (×3): 6 mg via INTRAVENOUS
  Filled 2020-11-15 (×3): qty 1

## 2020-11-15 MED ORDER — SODIUM CHLORIDE 0.9 % IV SOLN: 100.0000 mg | Freq: Every day | INTRAVENOUS | Status: DC

## 2020-11-15 MED ORDER — SODIUM CHLORIDE 0.9 % IV SOLN
100.0000 mg | Freq: Every day | INTRAVENOUS | Status: AC
  Administered 2020-11-16 – 2020-11-19 (×4): 100 mg via INTRAVENOUS
  Filled 2020-11-15 (×4): qty 20

## 2020-11-15 MED ORDER — ASPIRIN EC 81 MG PO TBEC
81.0000 mg | DELAYED_RELEASE_TABLET | Freq: Every day | ORAL | Status: DC
  Administered 2020-11-16 – 2020-11-19 (×4): 81 mg via ORAL
  Filled 2020-11-15 (×4): qty 1

## 2020-11-15 MED ORDER — ALBUTEROL SULFATE HFA 108 (90 BASE) MCG/ACT IN AERS
6.0000 | INHALATION_SPRAY | Freq: Once | RESPIRATORY_TRACT | Status: AC
  Administered 2020-11-15: 6 via RESPIRATORY_TRACT
  Filled 2020-11-15: qty 6.7

## 2020-11-15 MED ORDER — FUROSEMIDE 10 MG/ML IJ SOLN
80.0000 mg | Freq: Once | INTRAMUSCULAR | Status: AC
  Administered 2020-11-15: 80 mg via INTRAVENOUS
  Filled 2020-11-15: qty 8

## 2020-11-15 MED ORDER — IPRATROPIUM BROMIDE HFA 17 MCG/ACT IN AERS
2.0000 | INHALATION_SPRAY | Freq: Once | RESPIRATORY_TRACT | Status: AC
  Administered 2020-11-15: 2 via RESPIRATORY_TRACT
  Filled 2020-11-15: qty 12.9

## 2020-11-15 MED ORDER — CHLORHEXIDINE GLUCONATE CLOTH 2 % EX PADS
6.0000 | MEDICATED_PAD | Freq: Every day | CUTANEOUS | Status: DC
  Administered 2020-11-15 – 2020-11-18 (×4): 6 via TOPICAL

## 2020-11-15 MED ORDER — ACETAMINOPHEN 650 MG RE SUPP: 650.0000 mg | Freq: Four times a day (QID) | RECTAL | Status: DC | PRN

## 2020-11-15 MED ORDER — ACETAMINOPHEN 325 MG PO TABS: 650.0000 mg | ORAL_TABLET | Freq: Four times a day (QID) | ORAL | Status: DC | PRN

## 2020-11-15 MED ORDER — ENOXAPARIN SODIUM 30 MG/0.3ML IJ SOSY
30.0000 mg | PREFILLED_SYRINGE | INTRAMUSCULAR | Status: DC
  Administered 2020-11-16 – 2020-11-17 (×2): 30 mg via SUBCUTANEOUS
  Filled 2020-11-15 (×3): qty 0.3

## 2020-11-15 MED ORDER — SODIUM CHLORIDE 0.9 % IV SOLN: 200.0000 mg | Freq: Once | INTRAVENOUS | Status: DC

## 2020-11-15 NOTE — ED Provider Notes (Signed)
[facility] EMERGENCY DEPT Provider Note   CSN: [id number] Arrival date & time: 11/15/20  1407     History Chief Complaint  Patient presents with  . Shortness of Breath    Sean Pope is a 75 y.o. male.  The history is provided by the patient.  Shortness of Breath Severity:  Mild Onset quality:  Gradual Timing:  Constant Progression:  Unchanged Chronicity:  New Context comment:  Cough Relieved by:  Nothing Worsened by:  Nothing Associated symptoms: cough   Associated symptoms: no abdominal pain, no chest pain, no claudication, no diaphoresis, no ear pain, no fever, no headaches, no hemoptysis, no neck pain, no rash, no sore throat, no sputum production, no syncope, no swollen glands and no vomiting   Risk factors: no hx of PE/DVT        Past Medical History:  Diagnosis Date  . Acute respiratory failure with hypoxia ([place]) 09/17/2011  . AKI (acute kidney injury) ([place])    In the setting of ulcerative colitis flare, question relationship to mesalamine  . Anal fissure - posterior 01/22/2014  . Anemia-multifactorial 01/05/2014   Blood loss, medications, chronic illness kidney injury  . B12 deficiency - borderline 12/21/2014  . C. difficile colitis 06/02/2014  . Chronic kidney disease, stage III (moderate) ([place]) 09/08/2013  . Hyperlipidemia   . Hypertension   . Latent tuberculosis   . Long-term use of immunosuppressant medication-mercaptopurine and Humira 01/05/2014  . Lung collapse 09/26/2011  . Osteoporosis 06/29/2014   DEXA 06/2014 - lowest t score -4.2  . 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 Protein-calorie malnutrition, severe ([place]) 10/26/2013  . Respiratory failure, acute ([place]) 01/07/2014  . SAH (subarachnoid hemorrhage) ([place])   . Stroke ([place])   . Ulcerative colitis ([place]) 05/28/2013  . Vitamin D deficiency 06/03/2014    Patient Active Problem List   Diagnosis Date Noted  . Pneumonia due to COVID-19 virus 11/15/2020  . B12 deficiency -  borderline 12/21/2014  . Osteoporosis 06/29/2014  . Vitamin D deficiency 06/03/2014  . History of systemic steroid therapy 06/02/2014  . COPD with emphysema  GOLD III 01/20/2014  . Pulmonary infiltrates 01/19/2014  . Long-term use of immunosuppressant medication-mercaptopurine  01/05/2014  . Anemia-multifactorial 01/05/2014  . GERD (gastroesophageal reflux disease) 10/26/2013  . Chronic kidney disease, stage III (moderate) ([place]) 09/08/2013  . Ulcerative chronic pancolitis ([place]) 05/28/2013    Past Surgical History:  Procedure Laterality Date  . ANEURYSM COILING  2013   Posterior communicating  . CATARACT EXTRACTION Bilateral aug, sept 2016  . COLONOSCOPY W/ BIOPSIES  05/28/2013  . IR ANGIO INTRA EXTRACRAN SEL INTERNAL CAROTID BILAT MOD SED  10/22/2017  . IR ANGIO VERTEBRAL SEL VERTEBRAL UNI L MOD SED  10/22/2017       Family History  Problem Relation Age of Onset  . Heart disease Mother     Social History   Tobacco Use  . Smoking status: Former Smoker    Packs/day: 0.50    Years: 50.00    Pack years: 25.00    Types: Cigarettes    Quit date: 06/19/2011    Years since quitting: 9.4  . Smokeless tobacco: Never Used  Vaping Use  . Vaping Use: Former  Substance Use Topics  . Alcohol use: Yes    Alcohol/week: 0.0 standard drinks    Comment: rare  . Drug use: No    Home Medications Prior to Admission medications   Medication Sig Start Date End Date Taking? Authorizing Provider  Cholecalciferol (VITAMIN D3)  1000 units CAPS Take 1 capsule by mouth daily.   Yes [provider]  Ferrous Sulfate (IRON) 325 (65 FE) MG TABS Take 1 tablet by mouth 2 (two) times daily.    Yes [provider]  furosemide (LASIX) 40 MG tablet Take 2 tablets (80 mg total) by mouth daily. Patient taking differently: Take 40 mg by mouth daily. 03/12/14  Yes Gatha Mayer, MD  mercaptopurine (PURINETHOL) 50 MG tablet Take 1.5 tablets (75 mg total) by mouth daily. 02/19/20  Yes Gatha Mayer, MD  Multiple Vitamins-Minerals (MENS MULTIVITAMIN PLUS PO) Take 1 tablet by mouth. Centrum Silver   Yes [provider]  vitamin B-12 (CYANOCOBALAMIN) 1000 MCG tablet Take 1,000 mcg by mouth daily.   Yes [provider]  calcium carbonate (OS-CAL) 600 MG TABS tablet Take 600 mg by mouth daily.    [provider]    Allergies    Patient has no known allergies.  Review of Systems   Review of Systems  Constitutional: Negative for chills, diaphoresis and fever.  HENT: Negative for ear pain and sore throat.   Eyes: Negative for pain and visual disturbance.  Respiratory: Positive for cough and shortness of breath. Negative for hemoptysis and sputum production.   Cardiovascular: Positive for leg swelling. Negative for chest pain, palpitations, claudication and syncope.  Gastrointestinal: Negative for abdominal pain and vomiting.  Genitourinary: Negative for dysuria and hematuria.  Musculoskeletal: Negative for arthralgias, back pain and neck pain.  Skin: Negative for color change and rash.  Neurological: Negative for seizures, syncope and headaches.  All other systems reviewed and are negative.   Physical Exam Updated Vital Signs  ED Triage Vitals  Enc Vitals Group     BP 11/15/20 1419 126/79     Pulse Rate 11/15/20 1419 87     Resp 11/15/20 1419 (!) 26     Temp 11/15/20 1419 98.7 F (37.1 C)     Temp Source 11/15/20 1419 Oral     SpO2 11/15/20 1419 (!) 84 %     Weight 11/15/20 1416 152 lb (68.9 kg)     Height 11/15/20 1416 6' (1.829 m)     Head Circumference --      Peak Flow --      Pain Score 11/15/20 1420 0     Pain Loc --      Pain Edu? --      Excl. in [place]? --     Physical Exam Vitals and nursing note reviewed.  Constitutional:      General: He is not in acute distress.    Appearance: He is well-developed. He is ill-appearing.  HENT:     Head: Normocephalic and atraumatic.     Mouth/Throat:     Mouth: Mucous membranes are moist.   Eyes:     Conjunctiva/sclera: Conjunctivae normal.     Pupils: Pupils are equal, round, and reactive to light.  Cardiovascular:     Rate and Rhythm: Normal rate and regular rhythm.     Pulses: Normal pulses.     Heart sounds: Normal heart sounds. No murmur heard.   Pulmonary:     Effort: Tachypnea present. No respiratory distress.     Breath sounds: Decreased breath sounds present.  Abdominal:     Palpations: Abdomen is soft.     Tenderness: There is no abdominal tenderness.  Musculoskeletal:     Cervical back: Normal range of motion and neck supple.     Right lower leg:  Edema (3+) present.     Left lower leg: Edema (3+) present.  Skin:    General: Skin is warm.     Capillary Refill: Capillary refill takes less than 2 seconds.  Neurological:     General: No focal deficit present.     Mental Status: He is alert.  Psychiatric:        Mood and Affect: Mood normal.     ED Results / Procedures / Treatments   Labs (all labs ordered are listed, but only abnormal results are displayed) Labs Reviewed  RESP PANEL BY RT-PCR (FLU A&B, COVID) ARPGX2 - Abnormal; Notable for the following components:      Result Value   SARS Coronavirus 2 by RT PCR POSITIVE (*)    All other components within normal limits  CBC - Abnormal; Notable for the following components:   RBC 3.38 (*)    Hemoglobin 11.9 (*)    HCT 37.4 (*)    MCV 110.7 (*)    MCH 35.2 (*)    RDW 17.4 (*)    nRBC 0.3 (*)    All other components within normal limits  COMPREHENSIVE METABOLIC PANEL - Abnormal; Notable for the following components:   Glucose, Bld 100 (*)    BUN 44 (*)    Creatinine, Ser 2.31 (*)    Total Protein 6.1 (*)    GFR, Estimated 29 (*)    All other components within normal limits  BRAIN NATRIURETIC PEPTIDE - Abnormal; Notable for the following components:   B Natriuretic Peptide 253.8 (*)    All other components within normal limits  D-DIMER, QUANTITATIVE - Abnormal; Notable for the following  components:   D-Dimer, Quant 2.45 (*)    All other components within normal limits  TROPONIN I (HIGH SENSITIVITY) - Abnormal; Notable for the following components:   Troponin I (High Sensitivity) 1,105 (*)    All other components within normal limits  CULTURE, BLOOD (ROUTINE X 2)  CULTURE, BLOOD (ROUTINE X 2)  LACTIC ACID, PLASMA  LACTIC ACID, PLASMA  PROCALCITONIN  LACTATE DEHYDROGENASE  FERRITIN  TRIGLYCERIDES  FIBRINOGEN  C-REACTIVE PROTEIN  TROPONIN I (HIGH SENSITIVITY)    EKG EKG Interpretation  Date/Time:  Tuesday Nov 15 2020 14:43:58 EDT Ventricular Rate:  76 PR Interval:  155 QRS Duration: 86 QT Interval:  356 QTC Calculation: 401 R Axis:   67 Text Interpretation: Sinus rhythm Multiple premature complexes, vent & supraven Confirmed by Lennice Sites ([id number]) on 11/15/2020 3:03:43 PM   Radiology DG Chest Port 1 View  Result Date: 11/15/2020 CLINICAL DATA:  Dyspnea.  Cough and congestion EXAM: PORTABLE CHEST 1 VIEW COMPARISON:  06/21/2015 FINDINGS: COPD with pulmonary hyperinflation and emphysema. Mild scarring in the left lung base. Negative for pneumonia.  Negative for heart failure or effusion. IMPRESSION: Advanced COPD without acute abnormality. Electronically Signed   By: Franchot Gallo M.D.   On: 11/15/2020 15:22    Procedures .Critical Care Performed by: Lennice Sites, DO Authorized by: Lennice Sites, DO   Critical care provider statement:    Critical care time (minutes):  45   Critical care was necessary to treat or prevent imminent or life-threatening deterioration of the following conditions:  Respiratory failure   Critical care was time spent personally by me on the following activities:  Blood draw for specimens, development of treatment plan with patient or surrogate, discussions with primary provider, evaluation of patient's response to treatment, examination of patient, obtaining history from patient or surrogate, ordering and performing  treatments and  interventions, ordering and review of laboratory studies, ordering and review of radiographic studies, pulse oximetry, re-evaluation of patient's condition and review of old charts   I assumed direction of critical care for this patient from another provider in my specialty: no     Care discussed with: admitting provider       Medications Ordered in ED Medications  dexamethasone (DECADRON) injection 6 mg (has no administration in time range)  remdesivir 100 mg in sodium chloride 0.9 % 100 mL IVPB (has no administration in time range)  remdesivir 100 mg in sodium chloride 0.9 % 100 mL IVPB (has no administration in time range)  furosemide (LASIX) injection 80 mg (80 mg Intravenous Given 11/15/20 1524)  albuterol (VENTOLIN HFA) 108 (90 Base) MCG/ACT inhaler 6 puff (6 puffs Inhalation Given 11/15/20 1539)  ipratropium (ATROVENT HFA) inhaler 2 puff (2 puffs Inhalation Given 11/15/20 1539)    ED Course  I have reviewed the triage vital signs and the nursing notes.  Pertinent labs & imaging results that were available during my care of the patient were reviewed by me and considered in my medical decision making (see chart for details).    MDM Rules/Calculators/A&P                          Sean Pope is a 75 year old male with history of CKD, high cholesterol, hypertension, subarachnoid hemorrhage who presents the ED with shortness of breath.  Ongoing for the last 3 to 4 days.  Has chronic swelling in his legs and family concerned about fluid in his lungs.  Mildly hypoxic here in the mid 80s and improved on 2 L of oxygen.  Otherwise no fever.  Denies any chest pain.  Has pitting edema in his legs bilaterally.  Concern for possibly heart failure versus worsening CKD versus infectious process versus PE versus COPD exacerbation versus COVID versus ACS.  COVID is positive.  Troponin is 1000, D-dimer elevated to 2.4.  Chest x-ray with overall no fluid overload or pneumonia.  Otherwise lab work is  unremarkable.  Talked with the hospitalist and overall suspect that troponin and D-dimer are in the setting of patient being COVID-positive.  He is not having any active chest pain.  EKG shows sinus rhythm without ischemic changes.  Will trend troponin and hold heparin at this time especially given history of subarachnoid hemorrhage.  Overall he appears comfortable.  IV Decadron and IV remdesivir have been initiated.  Will be admitted to medicine for further care.  This chart was dictated using voice recognition software.  Despite best efforts to proofread,  errors can occur which can change the documentation meaning.    Final Clinical Impression(s) / ED Diagnoses Final diagnoses:  COVID-19  Elevated troponin  Acute respiratory failure with hypoxia [facility])    Rx / DC Orders ED Discharge Orders    None       Lennice Sites, DO 11/15/20 1639

## 2020-11-15 NOTE — H&P (Signed)
History and Physical    Sean Pope ENI:[id number] DOB: 1945/12/23 DOA: 11/15/2020  PCP: Tenna Child, MD  Patient coming from: Home.  Chief Complaint: Shortness of breath.  HPI: Sean Pope is a 75 y.o. male with history of COPD, subarachnoid hemorrhage status post aneurysmal coiling in 2013, ulcerative colitis on mercaptopurine, chronic kidney disease stage III baseline creatinine around 1.6 presents to the ER with complaint of shortness of breath which has been ongoing for the last 3 days.  Denies any chest pain productive cough fever or chills.  ED Course: In the ER chest x-ray was showing COPD features.  Patient was hypoxic requiring 3 L oxygen.  COVID test came back positive.  High sensitive troponins were elevated with initial reading was 1105 subsequent 1 was 1139.  Patient did not complain any chest pain.  And no anticoagulation was started with history of previous intracranial bleed.  Patient was started on Decadron and remdesivir for COVID infection and admitted for further management.  Creatinine also has acutely worsened from 1.6 last year April 2021 and it is around 2.3.  At the time of my exam patient was very lethargic ABG was done which showed pH of 7.21 with PCO2 of 74 for which I consulted pulmonary critical care.  Review of Systems: As per HPI, rest all negative.   Past Medical History:  Diagnosis Date  . Acute respiratory failure with hypoxia ([place]) 09/17/2011  . AKI (acute kidney injury) ([place])    In the setting of ulcerative colitis flare, question relationship to mesalamine  . Anal fissure - posterior 01/22/2014  . Anemia-multifactorial 01/05/2014   Blood loss, medications, chronic illness kidney injury  . B12 deficiency - borderline 12/21/2014  . C. difficile colitis 06/02/2014  . Chronic kidney disease, stage III (moderate) ([place]) 09/08/2013  . Hyperlipidemia   . Hypertension   . Latent tuberculosis   . Long-term use of immunosuppressant  medication-mercaptopurine and Humira 01/05/2014  . Lung collapse 09/26/2011  . Osteoporosis 06/29/2014   DEXA 06/2014 - lowest t score -4.2  . 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 Protein-calorie malnutrition, severe ([place]) 10/26/2013  . Respiratory failure, acute ([place]) 01/07/2014  . SAH (subarachnoid hemorrhage) ([place])   . Stroke ([place])   . Ulcerative colitis ([place]) 05/28/2013  . Vitamin D deficiency 06/03/2014    Past Surgical History:  Procedure Laterality Date  . ANEURYSM COILING  2013   Posterior communicating  . CATARACT EXTRACTION Bilateral aug, sept 2016  . COLONOSCOPY W/ BIOPSIES  05/28/2013  . IR ANGIO INTRA EXTRACRAN SEL INTERNAL CAROTID BILAT MOD SED  10/22/2017  . IR ANGIO VERTEBRAL SEL VERTEBRAL UNI L MOD SED  10/22/2017     reports that he quit smoking about 9 years ago. His smoking use included cigarettes. He has a 25.00 pack-year smoking history. He has never used smokeless tobacco. He reports current alcohol use. He reports that he does not use drugs.  No Known Allergies  Family History  Problem Relation Age of Onset  . Heart disease Mother     Prior to Admission medications   Medication Sig Start Date End Date Taking? Authorizing Provider  Cholecalciferol (VITAMIN D3) 1000 units CAPS Take 1 capsule by mouth daily.   Yes [provider]  Ferrous Sulfate (IRON) 325 (65 FE) MG TABS Take 1 tablet by mouth 2 (two) times daily.    Yes [provider]  furosemide (LASIX) 40 MG tablet Take 2 tablets (80 mg total) by mouth daily. Patient taking  differently: Take 40 mg by mouth daily. 03/12/14  Yes Gatha Mayer, MD  mercaptopurine (PURINETHOL) 50 MG tablet Take 1.5 tablets (75 mg total) by mouth daily. 02/19/20  Yes Gatha Mayer, MD  Multiple Vitamins-Minerals (MENS MULTIVITAMIN PLUS PO) Take 1 tablet by mouth. Centrum Silver   Yes [provider]  vitamin B-12 (CYANOCOBALAMIN) 1000 MCG tablet Take 1,000 mcg by mouth daily.   Yes  [provider]  calcium carbonate (OS-CAL) 600 MG TABS tablet Take 600 mg by mouth daily.    [provider]    Physical Exam: Constitutional: Moderately built and nourished. Vitals:   11/15/20 2000 11/15/20 2100 11/15/20 2200 11/15/20 2225  BP: 122/74 (!) 90/51 (!) 90/49 122/63  Pulse: 79 (!) 34 (!) 30 (!) 40  Resp: (!) 32 (!) 26 (!) 27 18  Temp:      TempSrc:      SpO2: 98% 97% 98% 99%  Weight:      Height:       Eyes: Anicteric no pallor. ENMT: No discharge from the ears eyes nose or mouth. Neck: No mass felt.  No neck rigidity. Respiratory: No rhonchi or crepitations. Cardiovascular: S1-S2 heard. Abdomen: Soft nontender bowel sounds present. Musculoskeletal: No edema. Skin: No rash. Neurologic: Mildly lethargic but answers questions appropriately moving all extremities. Psychiatric: Mildly lethargic.   Labs on Admission: I have personally reviewed following labs and imaging studies  CBC: Recent Labs  Lab 11/15/20 1502  WBC 5.9  HGB 11.9*  HCT 37.4*  MCV 110.7*  PLT 549   Basic Metabolic Panel: Recent Labs  Lab 11/15/20 1502  NA 143  K 4.2  CL 104  CO2 32  GLUCOSE 100*  BUN 44*  CREATININE 2.31*  CALCIUM 8.9   GFR: Estimated Creatinine Clearance: 26.5 mL/min (A) (by C-G formula based on SCr of 2.31 mg/dL (H)). Liver Function Tests: Recent Labs  Lab 11/15/20 1502  AST 26  ALT 15  ALKPHOS 49  BILITOT 0.4  PROT 6.1*  ALBUMIN 3.8   No results for input(s): LIPASE, AMYLASE in the last 168 hours. No results for input(s): AMMONIA in the last 168 hours. Coagulation Profile: No results for input(s): INR, PROTIME in the last 168 hours. Cardiac Enzymes: No results for input(s): CKTOTAL, CKMB, CKMBINDEX, TROPONINI in the last 168 hours. BNP (last 3 results) No results for input(s): PROBNP in the last 8760 hours. HbA1C: No results for input(s): HGBA1C in the last 72 hours. CBG: No results for input(s): GLUCAP in the last 168  hours. Lipid Profile: Recent Labs    11/15/20 1635  TRIG 112   Thyroid Function Tests: No results for input(s): TSH, T4TOTAL, FREET4, T3FREE, THYROIDAB in the last 72 hours. Anemia Panel: Recent Labs    11/15/20 1635  FERRITIN 158   Urine analysis:    Component Value Date/Time   COLORURINE YELLOW 01/08/2014 0120   APPEARANCEUR CLOUDY (A) 01/08/2014 0120   LABSPEC 1.020 01/08/2014 0120   PHURINE 5.5 01/08/2014 0120   GLUCOSEU NEGATIVE 01/08/2014 0120   GLUCOSEU NEGATIVE 10/20/2013 0855   HGBUR NEGATIVE 01/08/2014 0120   BILIRUBINUR NEGATIVE 01/08/2014 0120   KETONESUR NEGATIVE 01/08/2014 0120   PROTEINUR NEGATIVE 01/08/2014 0120   UROBILINOGEN 0.2 01/08/2014 0120   NITRITE NEGATIVE 01/08/2014 0120   LEUKOCYTESUR NEGATIVE 01/08/2014 0120   Sepsis Labs: @LABRCNTIP (procalcitonin:4,lacticidven:4) ) Recent Results (from the past 240 hour(s))  Resp Panel by RT-PCR (Flu A&B, Covid) Nasopharyngeal Swab     Status: Abnormal   Collection  Time: 11/15/20  3:02 PM   Specimen: Nasopharyngeal Swab; Nasopharyngeal(NP) swabs in vial transport medium  Result Value Ref Range Status   SARS Coronavirus 2 by RT PCR POSITIVE (A) NEGATIVE Final    Comment: RESULT CALLED TO, READ BACK BY AND VERIFIED WITH: TATA CARBONNE, 1622, 11/15/20, [place] (NOTE) SARS-CoV-2 target nucleic acids are DETECTED.  The SARS-CoV-2 RNA is generally detectable in upper respiratory specimens during the acute phase of infection. Positive results are indicative of the presence of the identified virus, but do not rule out bacterial infection or co-infection with other pathogens not detected by the test. Clinical correlation with patient history and other diagnostic information is necessary to determine patient infection status. The expected result is Negative.  Fact Sheet for Patients: EntrepreneurPulse.com.au  Fact Sheet for Healthcare Providers: IncredibleEmployment.be  This  test is not yet approved or cleared by the [place] FDA and  has been authorized for detection and/or diagnosis of SARS-CoV-2 by FDA under an Emergency Use Authorization (EUA).  This EUA will remain in effect (meaning this test can be  used) for the duration of  the COVID-19 declaration under Section 564(b)(1) of the Act, 21 U.S.C. section 360bbb-3(b)(1), unless the authorization is terminated or revoked sooner.     Influenza A by PCR NEGATIVE NEGATIVE Final   Influenza B by PCR NEGATIVE NEGATIVE Final    Comment: (NOTE) The Xpert Xpress SARS-CoV-2/FLU/RSV plus assay is intended as an aid in the diagnosis of influenza from Nasopharyngeal swab specimens and should not be used as a sole basis for treatment. Nasal washings and aspirates are unacceptable for Xpert Xpress SARS-CoV-2/FLU/RSV testing.  Fact Sheet for Patients: EntrepreneurPulse.com.au  Fact Sheet for Healthcare Providers: IncredibleEmployment.be  This test is not yet approved or cleared by the [place] FDA and has been authorized for detection and/or diagnosis of SARS-CoV-2 by FDA under an Emergency Use Authorization (EUA). This EUA will remain in effect (meaning this test can be used) for the duration of the COVID-19 declaration under Section 564(b)(1) of the Act, 21 U.S.C. section 360bbb-3(b)(1), unless the authorization is terminated or revoked.  Performed at [facility], [address] [place], [place] [address]   MRSA PCR Screening     Status: None   Collection Time: 11/15/20  7:13 PM   Specimen: Nasal Mucosa; Nasopharyngeal  Result Value Ref Range Status   MRSA by PCR NEGATIVE NEGATIVE Final    Comment:        The GeneXpert MRSA Assay (FDA approved for NASAL specimens only), is one component of a comprehensive MRSA colonization surveillance program. It is not intended to diagnose MRSA infection nor to guide or monitor treatment for MRSA  infections. Performed at [facility], [place] [address], [place], [place] [address]      Radiological Exams on Admission: DG Chest Port 1 View  Result Date: 11/15/2020 CLINICAL DATA:  Dyspnea.  Cough and congestion EXAM: PORTABLE CHEST 1 VIEW COMPARISON:  06/21/2015 FINDINGS: COPD with pulmonary hyperinflation and emphysema. Mild scarring in the left lung base. Negative for pneumonia.  Negative for heart failure or effusion. IMPRESSION: Advanced COPD without acute abnormality. Electronically Signed   By: Franchot Gallo M.D.   On: 11/15/2020 15:22    EKG: Independently reviewed.  Normal sinus rhythm with T wave inversion in V1.  Assessment/Plan Principal Problem:   Acute respiratory failure with hypoxia and hypercapnia (HCC) Active Problems:   Chronic kidney disease, stage III (moderate) (HCC)   Long-term use of immunosuppressant medication-mercaptopurine  COPD with emphysema  GOLD III   Pneumonia due to COVID-19 virus   Elevated troponin   Acute respiratory failure with hypoxia (HCC)    1. Acute respiratory failure with hypoxia and hypercapnia likely secondary to COVID infection in the setting of COPD.  Appreciate pulmonary consult.  At this time be APAP as needed.  Further recommendations per pulmonologist. 2. COVID-19 infection presently on Decadron and remdesivir.  Closely monitor respiratory status follow inflammatory markers. 3. Elevated troponin could be related to COVID infection.  Denies any chest pain.  We will keep patient on aspirin trend cardiac markers check 2D echo. 4. Acute on chronic kidney disease stage III denies any diarrhea.  May be related to COVID infection.  Follow metabolic panel closely. 5. History of ulcerative colitis on mercaptopurine presently NPO. 6. History of subarachnoid hemorrhage status post aneurysmal coiling in 2013. 7. Macrocytic anemia likely related to patient's ulcerative colitis.  Takes B12 supplements.  Follow CBC.  Since  patient has acute respiratory failure with hypoxia and hypercapnia with COVID infection will need close monitoring for any further worsening in inpatient status.   DVT prophylaxis: Lovenox. Code Status: Full code. Family Communication: We will need to discuss with family. Disposition Plan: To be determined. Consults called: Pulmonologist. Admission status: Inpatient.   Rise Patience MD [facility] Pager [phone].  If 7PM-7AM, please contact night-coverage [email or web address] Password [facility]  11/15/2020, 11:33 PM

## 2020-11-15 NOTE — ED Triage Notes (Addendum)
Cough for the last few days  Feels  Sob  Feet are swollen denies cp  Saw kidney dr in [place] and was told to up his fluid pills to 2 a day and he did , but has not see anyone  Since then

## 2020-11-15 NOTE — Progress Notes (Signed)
Date and time results received: 11/15/20 @ 2300  Test: ABG Critical Value: CO2 74.1  Name of Provider Notified: On call  [place] provider  Orders Received? Or Actions Taken?: Awaiting orders from provider

## 2020-11-16 ENCOUNTER — Encounter (HOSPITAL_COMMUNITY): Payer: Self-pay | Admitting: Internal Medicine

## 2020-11-16 DIAGNOSIS — J9601 Acute respiratory failure with hypoxia: Secondary | ICD-10-CM | POA: Diagnosis not present

## 2020-11-16 DIAGNOSIS — U071 COVID-19: Principal | ICD-10-CM

## 2020-11-16 DIAGNOSIS — J9602 Acute respiratory failure with hypercapnia: Secondary | ICD-10-CM | POA: Diagnosis not present

## 2020-11-16 DIAGNOSIS — J432 Centrilobular emphysema: Secondary | ICD-10-CM | POA: Diagnosis not present

## 2020-11-16 LAB — CBC
HCT: 37.6 % — ABNORMAL LOW (ref 39.0–52.0)
Hemoglobin: 11.8 g/dL — ABNORMAL LOW (ref 13.0–17.0)
MCH: 35.6 pg — ABNORMAL HIGH (ref 26.0–34.0)
MCHC: 31.4 g/dL (ref 30.0–36.0)
MCV: 113.6 fL — ABNORMAL HIGH (ref 80.0–100.0)
Platelets: 201 10*3/uL (ref 150–400)
RBC: 3.31 MIL/uL — ABNORMAL LOW (ref 4.22–5.81)
RDW: 17.6 % — ABNORMAL HIGH (ref 11.5–15.5)
WBC: 3.9 10*3/uL — ABNORMAL LOW (ref 4.0–10.5)
nRBC: 0.8 % — ABNORMAL HIGH (ref 0.0–0.2)

## 2020-11-16 LAB — CBC WITH DIFFERENTIAL/PLATELET
Abs Immature Granulocytes: 0.02 10*3/uL (ref 0.00–0.07)
Basophils Absolute: 0 10*3/uL (ref 0.0–0.1)
Basophils Relative: 0 %
Eosinophils Absolute: 0 10*3/uL (ref 0.0–0.5)
Eosinophils Relative: 0 %
HCT: 37.9 % — ABNORMAL LOW (ref 39.0–52.0)
Hemoglobin: 11.7 g/dL — ABNORMAL LOW (ref 13.0–17.0)
Immature Granulocytes: 1 %
Lymphocytes Relative: 15 %
Lymphs Abs: 0.5 10*3/uL — ABNORMAL LOW (ref 0.7–4.0)
MCH: 35.3 pg — ABNORMAL HIGH (ref 26.0–34.0)
MCHC: 30.9 g/dL (ref 30.0–36.0)
MCV: 114.5 fL — ABNORMAL HIGH (ref 80.0–100.0)
Monocytes Absolute: 0.1 10*3/uL (ref 0.1–1.0)
Monocytes Relative: 2 %
Neutro Abs: 2.9 10*3/uL (ref 1.7–7.7)
Neutrophils Relative %: 82 %
Platelets: 196 10*3/uL (ref 150–400)
RBC: 3.31 MIL/uL — ABNORMAL LOW (ref 4.22–5.81)
RDW: 17.6 % — ABNORMAL HIGH (ref 11.5–15.5)
WBC: 3.5 10*3/uL — ABNORMAL LOW (ref 4.0–10.5)
nRBC: 0 % (ref 0.0–0.2)

## 2020-11-16 LAB — COMPREHENSIVE METABOLIC PANEL
ALT: 18 U/L (ref 0–44)
AST: 26 U/L (ref 15–41)
Albumin: 3.2 g/dL — ABNORMAL LOW (ref 3.5–5.0)
Alkaline Phosphatase: 45 U/L (ref 38–126)
Anion gap: 9 (ref 5–15)
BUN: 49 mg/dL — ABNORMAL HIGH (ref 8–23)
CO2: 28 mmol/L (ref 22–32)
Calcium: 8.2 mg/dL — ABNORMAL LOW (ref 8.9–10.3)
Chloride: 105 mmol/L (ref 98–111)
Creatinine, Ser: 2.26 mg/dL — ABNORMAL HIGH (ref 0.61–1.24)
GFR, Estimated: 30 mL/min — ABNORMAL LOW (ref >60)
Glucose, Bld: 126 mg/dL — ABNORMAL HIGH (ref 70–99)
Potassium: 4.8 mmol/L (ref 3.5–5.1)
Sodium: 142 mmol/L (ref 135–145)
Total Bilirubin: 0.5 mg/dL (ref 0.3–1.2)
Total Protein: 6 g/dL — ABNORMAL LOW (ref 6.5–8.1)

## 2020-11-16 LAB — CREATININE, SERUM
Creatinine, Ser: 2.17 mg/dL — ABNORMAL HIGH (ref 0.61–1.24)
GFR, Estimated: 31 mL/min — ABNORMAL LOW (ref >60)

## 2020-11-16 LAB — TROPONIN I (HIGH SENSITIVITY)
Troponin I (High Sensitivity): 652 ng/L (ref <18)
Troponin I (High Sensitivity): 745 ng/L (ref <18)

## 2020-11-16 LAB — GLUCOSE, CAPILLARY
Glucose-Capillary: 113 mg/dL — ABNORMAL HIGH (ref 70–99)
Glucose-Capillary: 121 mg/dL — ABNORMAL HIGH (ref 70–99)
Glucose-Capillary: 121 mg/dL — ABNORMAL HIGH (ref 70–99)
Glucose-Capillary: 137 mg/dL — ABNORMAL HIGH (ref 70–99)
Glucose-Capillary: 181 mg/dL — ABNORMAL HIGH (ref 70–99)

## 2020-11-16 LAB — C-REACTIVE PROTEIN: CRP: 1.3 mg/dL — ABNORMAL HIGH (ref <1.0)

## 2020-11-16 LAB — PROCALCITONIN: Procalcitonin: <0.1 ng/mL

## 2020-11-16 MED ORDER — ALUM & MAG HYDROXIDE-SIMETH 200-200-20 MG/5ML PO SUSP
30.0000 mL | ORAL | Status: DC | PRN
  Administered 2020-11-16: 30 mL via ORAL
  Filled 2020-11-16: qty 30

## 2020-11-16 MED ORDER — SODIUM CHLORIDE 0.9 % IV SOLN: INTRAVENOUS | Status: DC

## 2020-11-16 MED ORDER — IPRATROPIUM-ALBUTEROL 20-100 MCG/ACT IN AERS
1.0000 | INHALATION_SPRAY | Freq: Four times a day (QID) | RESPIRATORY_TRACT | Status: DC | PRN
  Filled 2020-11-16: qty 4

## 2020-11-16 NOTE — Consult Note (Signed)
NAME:  Sean Pope, MRN:  [id number], DOB:  08/18/1945, LOS: 1 ADMISSION DATE:  11/15/2020, CONSULTATION DATE:  11/16/2020 REFERRING MD:  Dr. Hal Hope, [facility] COMPLAINT:  Short of breath   History of Present Illness:  75 yo male former smoker with hx of COPD from emphysema presented to ER on 5/31 with fatigue, hypoxia, dyspnea, cough.  Found to have COVID 19 infection.  ABG showed hypercapnia with mild respiratory acidosis (likely acute on chronic).  Pertinent  Medical History  Vit D deficiency, Ulcerative colitis, CVA, SAH, Osteoporosis, Latent TB s/p tx in 2017, HTN, HLD, CKD 3a, C diff, Anemia, Emphysema  Significant Hospital Events: Including procedures, antibiotic start and stop dates in addition to other pertinent events   . 5/31 transfer from [facility]; start decadron and remdesivir  Interim History / Subjective:  He denies chest pain or sputum.  No abdominal pain.  Has chronic leg swelling.  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  Objective   Blood pressure 122/63, pulse (!) 40, temperature 98.4 F (36.9 C), temperature source Oral, resp. rate 18, height 6' (1.829 m), weight 67.9 kg, SpO2 99 %.        Intake/Output Summary (Last 24 hours) at 11/16/2020 0013 Last data filed at 11/15/2020 1801 Gross per 24 hour  Intake 97.78 ml  Output --  Net 97.78 ml   Filed Weights   11/15/20 1416 11/15/20 1913  Weight: 68.9 kg 67.9 kg    Examination:  General - alert Eyes - pupils reactive ENT - no sinus tenderness, no stridor Cardiac - regular rate/rhythm, no murmur Chest - decreased breath sounds b/l, no wheezing or rales Abdomen - soft, non tender, + bowel sounds Extremities - no cyanosis, clubbing, or edema Skin - no rashes Neuro - mild confusion, normal strength, moves extremities, follows commands  CXR - hyperinflation, basilar scarring PFT 04/23/14 >> 1.29 (41%), FEV1% 42, TLC  7.66 (113%), DLCO 38%  Resolved Hospital Problem list     Assessment & Plan:   COVID 19 infection with increased O2 needs. - monitor in ICU/SDU - remdesivir and decadron started  Acute on chronic hypoxia/hypercapnic respiratory failure in setting of COVID 19 infection with severe COPD from emphysema. - Bipap as needed; respiratory status okay at present - supplemental oxygen to keep SpO2 88 to 95% - f/u CXR as needed - prn combivent - defer repeating ABG unless he develops worsening respiratory or mental status  Hx of ulcerative colitis on mercaptopurine as outpt. - followed by Dr. Carlean Purl with [facility]  CKD 3a. - f/u BMET  Best practice (right click and "Reselect all SmartList Selections" daily)  Diet:  NPO Pain/Anxiety/Delirium protocol (if indicated): No VAP protocol (if indicated): Not indicated DVT prophylaxis: LMWH GI prophylaxis: N/A Glucose control:  SSI No Central venous access:  N/A Arterial line:  N/A Foley:  N/A Mobility:  bed rest  PT consulted: N/A Last date of multidisciplinary goals of care discussion [1] Code Status:  full code Disposition: ICU/SDU  Labs   CBC: Recent Labs  Lab 11/15/20 1502  WBC 5.9  HGB 11.9*  HCT 37.4*  MCV 110.7*  PLT 694    Basic Metabolic Panel: Recent Labs  Lab 11/15/20 1502  NA 143  K 4.2  CL 104  CO2 32  GLUCOSE 100*  BUN 44*  CREATININE 2.31*  CALCIUM 8.9   GFR: Estimated Creatinine Clearance: 26.5 mL/min (A) (  by C-G formula based on SCr of 2.31 mg/dL (H)). Recent Labs  Lab 11/15/20 1502 11/15/20 1635 11/15/20 1931  PROCALCITON  --  0.10  --   WBC 5.9  --   --   LATICACIDVEN  --  0.5 0.7    Liver Function Tests: Recent Labs  Lab 11/15/20 1502  AST 26  ALT 15  ALKPHOS 49  BILITOT 0.4  PROT 6.1*  ALBUMIN 3.8   No results for input(s): LIPASE, AMYLASE in the last 168 hours. No results for input(s): AMMONIA in the last 168 hours.  ABG    Component Value Date/Time   PHART 7.218 (L)  11/15/2020 2245   PCO2ART 74.1 (HH) 11/15/2020 2245   PO2ART 106 11/15/2020 2245   HCO3 29.0 (H) 11/15/2020 2245   TCO2 27 11/03/2011 1957   ACIDBASEDEF 4.1 (H) 09/17/2011 1745   O2SAT 96.9 11/15/2020 2245     Coagulation Profile: No results for input(s): INR, PROTIME in the last 168 hours.  Cardiac Enzymes: No results for input(s): CKTOTAL, CKMB, CKMBINDEX, TROPONINI in the last 168 hours.  HbA1C: No results found for: HGBA1C  CBG: No results for input(s): GLUCAP in the last 168 hours.  Review of Systems:   Reviewed and negative  Past Medical History:  He,  has a past medical history of Acute respiratory failure with hypoxia ([place]) (09/17/2011), AKI (acute kidney injury) ([place]), Anal fissure - posterior (01/22/2014), Anemia-multifactorial (01/05/2014), B12 deficiency - borderline (12/21/2014), C. difficile colitis (06/02/2014), Chronic kidney disease, stage III (moderate) ([place]) (09/08/2013), Emphysema lung ([place]), Hyperlipidemia, Hypertension, Latent tuberculosis, Long-term use of immunosuppressant medication-mercaptopurine and Humira (01/05/2014), Lung collapse (09/26/2011), Osteoporosis (06/29/2014), Posterior communicating artery aneurysm (09/17/2011), Protein-calorie malnutrition, severe ([place]) (10/26/2013), Respiratory failure, acute ([place]) (01/07/2014), SAH (subarachnoid hemorrhage) ([place]), Stroke ([place]), Ulcerative colitis ([place]) (05/28/2013), and Vitamin D deficiency (06/03/2014).   Surgical History:   Past Surgical History:  Procedure Laterality Date  . ANEURYSM COILING  2013   Posterior communicating  . CATARACT EXTRACTION Bilateral aug, sept 2016  . COLONOSCOPY W/ BIOPSIES  05/28/2013  . IR ANGIO INTRA EXTRACRAN SEL INTERNAL CAROTID BILAT MOD SED  10/22/2017  . IR ANGIO VERTEBRAL SEL VERTEBRAL UNI L MOD SED  10/22/2017     Social History:   reports that he quit smoking about 9 years ago. His smoking use included cigarettes. He has a 25.00 pack-year smoking history. He has never used  smokeless tobacco. He reports current alcohol use. He reports that he does not use drugs.   Family History:  His family history includes Heart disease in his mother.   Allergies No Known Allergies   Home Medications  Prior to Admission medications   Medication Sig Start Date End Date Taking? Authorizing Provider  Cholecalciferol (VITAMIN D3) 1000 units CAPS Take 1 capsule by mouth daily.   Yes [provider]  Ferrous Sulfate (IRON) 325 (65 FE) MG TABS Take 1 tablet by mouth 2 (two) times daily.    Yes [provider]  furosemide (LASIX) 40 MG tablet Take 2 tablets (80 mg total) by mouth daily. Patient taking differently: Take 40 mg by mouth daily. 03/12/14  Yes Gatha Mayer, MD  mercaptopurine (PURINETHOL) 50 MG tablet Take 1.5 tablets (75 mg total) by mouth daily. 02/19/20  Yes Gatha Mayer, MD  Multiple Vitamins-Minerals (MENS MULTIVITAMIN PLUS PO) Take 1 tablet by mouth. Centrum Silver   Yes [provider]  vitamin B-12 (CYANOCOBALAMIN) 1000 MCG tablet Take 1,000 mcg by mouth daily.   Yes  [provider]  calcium carbonate (OS-CAL) 600 MG TABS tablet Take 600 mg by mouth daily.    [provider]     Signature:  Chesley Mires, MD [place] Pager - [phone] 11/16/2020, 12:27 AM

## 2020-11-16 NOTE — Progress Notes (Signed)
PROGRESS NOTE    Sean Pope  GMW:[id number] DOB: [birth year] DOA: 11/15/2020 PCP: Tenna Child, MD   Brief Narrative:  This 75 years old male with PMH significant for COPD , ulcerative colitis on mercaptopurine, CVA, subarachnoid hemorrhage, status post aneurysmal coiling in 2013,  osteoporosis, hypertension, CKD stage III A, emphysema, tobacco abuse presented in ED with fatigue, cough, progressive shortness of breath and hypoxia requiring oxygen.  ABG shows hypercapnia with mild respiratory acidosis. Patient was admitted for acute on chronic hypoxic and hypercapnic respiratory failure secondary to COPD in the setting of COVID+. Patient is started on remdesivir.  He is weaned down to 2 to 3 L maintaining saturation above 95%.  Assessment & Plan:   Principal Problem:   Acute respiratory failure with hypoxia and hypercapnia (HCC) Active Problems:   Chronic kidney disease, stage III (moderate) (HCC)   Long-term use of immunosuppressant medication-mercaptopurine    COPD with emphysema  GOLD III   Pneumonia due to COVID-19 virus   Elevated troponin   Acute respiratory failure with hypoxia (HCC)   Acute on chronic hypoxic and hypercapnic respiratory failure in the setting of COVID-19 infection with severe COPD: Continue to monitor in the stepdown unit Continue supplemental oxygen to keep saturation above 95%. Continue BiPAP as needed. Chest x-ray shows improved infiltrate. Continue inhalers and breathing treatment. Pulmonology consulted, continue to monitor.  COVID+ Continue Decadron and remdesivir. Continue to monitor inflammatory markers. Closely monitor clinical status.  Elevated troponins: Could be secondary to COVID infection. Patient denies any chest pain, EKG : No new changes Continue aspirin, obtain 2D echocardiogram.  History of subarachnoid hemorrhage: status post aneurysmal coiling in 2013  History of ulcerative colitis: Continue outpatient GI follow-up with  Dr. Carlean Purl. Continue mercaptopurine  AKI on CKD stage IIIa: Baseline serum creatinine 1.6 This could be due to COVID+ Continue gentle hydration  Recheck labs in the morning.   DVT prophylaxis: Lovenox Code Status: Full code. Family Communication: No family at bed side. Disposition Plan:   Status is: Inpatient  Remains inpatient appropriate because:Inpatient level of care appropriate due to severity of illness   Dispo: The patient is from: Home              Anticipated d/c is to: Home              Patient currently is not medically stable to d/c.   Difficult to place patient No   Consultants:   PCCM  Procedures:  Antimicrobials:   Anti-infectives (From admission, onward)   Start     Dose/Rate Route Frequency Ordered Stop   11/16/20 1000  remdesivir 100 mg in sodium chloride 0.9 % 100 mL IVPB  Status:  Discontinued       "Followed by" Linked Group Details   100 mg 200 mL/hr over 30 Minutes Intravenous Daily 11/15/20 1627 11/15/20 1631   11/16/20 1000  remdesivir 100 mg in sodium chloride 0.9 % 100 mL IVPB        100 mg 200 mL/hr over 30 Minutes Intravenous Daily 11/15/20 1632 11/20/20 0959   11/15/20 1700  remdesivir 100 mg in sodium chloride 0.9 % 100 mL IVPB        100 mg 200 mL/hr over 30 Minutes Intravenous Every 30 min 11/15/20 1632 11/15/20 1910   11/15/20 1630  remdesivir 200 mg in sodium chloride 0.9% 250 mL IVPB  Status:  Discontinued       "Followed by" Linked Group Details   200 mg 580 mL/hr  over 30 Minutes Intravenous Once 11/15/20 1627 11/15/20 1632      Subjective: Patient was seen and examined at bedside.  Overnight events noted.  Patient is lying comfortably,  denies any chest pain.  He denies any shortness of breath.  He is weaned down to 2 L/min saturation 97%.  Objective: Vitals:   11/16/20 0800 11/16/20 1000 11/16/20 1100 11/16/20 1200  BP: (!) 92/58   (!) 152/64  Pulse: 69     Resp: (!) 23 (!) 22 (!) 22 20  Temp: 97.8 F (36.6 C)    97.7 F (36.5 C)  TempSrc: Oral   Oral  SpO2: 98%     Weight:      Height:        Intake/Output Summary (Last 24 hours) at 11/16/2020 1339 Last data filed at 11/16/2020 1200 Gross per 24 hour  Intake 622.02 ml  Output --  Net 622.02 ml   Filed Weights   11/15/20 1416 11/15/20 1913  Weight: 68.9 kg 67.9 kg    Examination:  General exam: Appears calm and comfortable, not in any acute distress. Respiratory system: Clear to auscultation. Respiratory effort normal. Cardiovascular system: S1 & S2 heard, RRR. No JVD, murmurs, rubs, gallops or clicks. No pedal edema. Gastrointestinal system: Abdomen is nondistended, soft and nontender. No organomegaly or masses felt. Normal bowel sounds heard. Central nervous system: Alert and oriented. No focal neurological deficits. Extremities: Symmetric 5 x 5 power.  No edema, no cyanosis, no clubbing. Skin: No rashes, lesions or ulcers Psychiatry: Judgement and insight appear normal. Mood & affect appropriate.     Data Reviewed: I have personally reviewed following labs and imaging studies  CBC: Recent Labs  Lab 11/15/20 1502 11/15/20 2357 11/16/20 0134  WBC 5.9 3.9* 3.5*  NEUTROABS  --   --  2.9  HGB 11.9* 11.8* 11.7*  HCT 37.4* 37.6* 37.9*  MCV 110.7* 113.6* 114.5*  PLT 221 201 323   Basic Metabolic Panel: Recent Labs  Lab 11/15/20 1502 11/15/20 2357 11/16/20 0134  NA 143  --  142  K 4.2  --  4.8  CL 104  --  105  CO2 32  --  28  GLUCOSE 100*  --  126*  BUN 44*  --  49*  CREATININE 2.31* 2.17* 2.26*  CALCIUM 8.9  --  8.2*   GFR: Estimated Creatinine Clearance: 27.1 mL/min (A) (by C-G formula based on SCr of 2.26 mg/dL (H)). Liver Function Tests: Recent Labs  Lab 11/15/20 1502 11/16/20 0134  AST 26 26  ALT 15 18  ALKPHOS 49 45  BILITOT 0.4 0.5  PROT 6.1* 6.0*  ALBUMIN 3.8 3.2*   No results for input(s): LIPASE, AMYLASE in the last 168 hours. No results for input(s): AMMONIA in the last 168 hours. Coagulation  Profile: No results for input(s): INR, PROTIME in the last 168 hours. Cardiac Enzymes: No results for input(s): CKTOTAL, CKMB, CKMBINDEX, TROPONINI in the last 168 hours. BNP (last 3 results) No results for input(s): PROBNP in the last 8760 hours. HbA1C: No results for input(s): HGBA1C in the last 72 hours. CBG: Recent Labs  Lab 11/16/20 0042 11/16/20 0614 11/16/20 1134  GLUCAP 121* 113* 121*   Lipid Profile: Recent Labs    11/15/20 1635  TRIG 112   Thyroid Function Tests: No results for input(s): TSH, T4TOTAL, FREET4, T3FREE, THYROIDAB in the last 72 hours. Anemia Panel: Recent Labs    11/15/20 1635  FERRITIN 158   Sepsis Labs: Recent Labs  Lab 11/15/20 1635 11/15/20 1931 11/15/20 2357  PROCALCITON 0.10  --  <0.10  LATICACIDVEN 0.5 0.7  --     Recent Results (from the past 240 hour(s))  Resp Panel by RT-PCR (Flu A&B, Covid) Nasopharyngeal Swab     Status: Abnormal   Collection Time: 11/15/20  3:02 PM   Specimen: Nasopharyngeal Swab; Nasopharyngeal(NP) swabs in vial transport medium  Result Value Ref Range Status   SARS Coronavirus 2 by RT PCR POSITIVE (A) NEGATIVE Final    Comment: RESULT CALLED TO, READ BACK BY AND VERIFIED WITH: TATA CARBONNE, 1622, 11/15/20, [place] (NOTE) SARS-CoV-2 target nucleic acids are DETECTED.  The SARS-CoV-2 RNA is generally detectable in upper respiratory specimens during the acute phase of infection. Positive results are indicative of the presence of the identified virus, but do not rule out bacterial infection or co-infection with other pathogens not detected by the test. Clinical correlation with patient history and other diagnostic information is necessary to determine patient infection status. The expected result is Negative.  Fact Sheet for Patients: EntrepreneurPulse.com.au  Fact Sheet for Healthcare Providers: IncredibleEmployment.be  This test is not yet approved or cleared by the  [place] FDA and  has been authorized for detection and/or diagnosis of SARS-CoV-2 by FDA under an Emergency Use Authorization (EUA).  This EUA will remain in effect (meaning this test can be  used) for the duration of  the COVID-19 declaration under Section 564(b)(1) of the Act, 21 U.S.C. section 360bbb-3(b)(1), unless the authorization is terminated or revoked sooner.     Influenza A by PCR NEGATIVE NEGATIVE Final   Influenza B by PCR NEGATIVE NEGATIVE Final    Comment: (NOTE) The Xpert Xpress SARS-CoV-2/FLU/RSV plus assay is intended as an aid in the diagnosis of influenza from Nasopharyngeal swab specimens and should not be used as a sole basis for treatment. Nasal washings and aspirates are unacceptable for Xpert Xpress SARS-CoV-2/FLU/RSV testing.  Fact Sheet for Patients: EntrepreneurPulse.com.au  Fact Sheet for Healthcare Providers: IncredibleEmployment.be  This test is not yet approved or cleared by the [place] FDA and has been authorized for detection and/or diagnosis of SARS-CoV-2 by FDA under an Emergency Use Authorization (EUA). This EUA will remain in effect (meaning this test can be used) for the duration of the COVID-19 declaration under Section 564(b)(1) of the Act, 21 U.S.C. section 360bbb-3(b)(1), unless the authorization is terminated or revoked.  Performed at [facility], [address], [place], [place] [address]   Blood Culture (routine x 2)     Status: None (Preliminary result)   Collection Time: 11/15/20  3:04 PM   Specimen: BLOOD  Result Value Ref Range Status   Specimen Description   Final    BLOOD Performed at [facility], [address] [place], [place] [address]    Special Requests   Final    BOTTLES DRAWN AEROBIC AND ANAEROBIC Blood Culture adequate volume LEFT ANTECUBITAL Performed at [facility], [address] [place], [place]  [address]    Culture   Final    NO GROWTH < 12 HOURS Performed at [facility] [address], [place], [place] [address]    Report Status PENDING  Incomplete  MRSA PCR Screening     Status: None   Collection Time: 11/15/20  7:13 PM   Specimen: Nasal Mucosa; Nasopharyngeal  Result Value Ref Range Status   MRSA by PCR NEGATIVE NEGATIVE Final    Comment:        The  GeneXpert MRSA Assay (FDA approved for NASAL specimens only), is one component of a comprehensive MRSA colonization surveillance program. It is not intended to diagnose MRSA infection nor to guide or monitor treatment for MRSA infections. Performed at [facility], [place] [address], [place], [place] [address]          Radiology Studies: [facility] Chest Port 1 View  Result Date: 11/15/2020 CLINICAL DATA:  Dyspnea.  Cough and congestion EXAM: PORTABLE CHEST 1 VIEW COMPARISON:  06/21/2015 FINDINGS: COPD with pulmonary hyperinflation and emphysema. Mild scarring in the left lung base. Negative for pneumonia.  Negative for heart failure or effusion. IMPRESSION: Advanced COPD without acute abnormality. Electronically Signed   By: Franchot Gallo M.D.   On: 11/15/2020 15:22   Scheduled Meds: . aspirin EC  81 mg Oral Daily  . Chlorhexidine Gluconate Cloth  6 each Topical Daily  . dexamethasone (DECADRON) injection  6 mg Intravenous Q24H  . enoxaparin (LOVENOX) injection  30 mg Subcutaneous Q24H   Continuous Infusions: . remdesivir 100 mg in NS 100 mL Stopped (11/16/20 1024)     LOS: 1 day    Time spent: 35 mins    Marko Skalski, MD [facility]   If 7PM-7AM, please contact night-coverage

## 2020-11-17 ENCOUNTER — Telehealth: Payer: Self-pay | Admitting: Pulmonary Disease

## 2020-11-17 DIAGNOSIS — Z79899 Other long term (current) drug therapy: Secondary | ICD-10-CM

## 2020-11-17 DIAGNOSIS — N1831 Chronic kidney disease, stage 3a: Secondary | ICD-10-CM

## 2020-11-17 LAB — CBC WITH DIFFERENTIAL/PLATELET
Abs Immature Granulocytes: 0.03 10*3/uL (ref 0.00–0.07)
Basophils Absolute: 0 10*3/uL (ref 0.0–0.1)
Basophils Relative: 0 %
Eosinophils Absolute: 0 10*3/uL (ref 0.0–0.5)
Eosinophils Relative: 0 %
HCT: 36.6 % — ABNORMAL LOW (ref 39.0–52.0)
Hemoglobin: 11.5 g/dL — ABNORMAL LOW (ref 13.0–17.0)
Immature Granulocytes: 1 %
Lymphocytes Relative: 10 %
Lymphs Abs: 0.6 10*3/uL — ABNORMAL LOW (ref 0.7–4.0)
MCH: 35.3 pg — ABNORMAL HIGH (ref 26.0–34.0)
MCHC: 31.4 g/dL (ref 30.0–36.0)
MCV: 112.3 fL — ABNORMAL HIGH (ref 80.0–100.0)
Monocytes Absolute: 0.5 10*3/uL (ref 0.1–1.0)
Monocytes Relative: 7 %
Neutro Abs: 5.5 10*3/uL (ref 1.7–7.7)
Neutrophils Relative %: 82 %
Platelets: 214 10*3/uL (ref 150–400)
RBC: 3.26 MIL/uL — ABNORMAL LOW (ref 4.22–5.81)
RDW: 17.2 % — ABNORMAL HIGH (ref 11.5–15.5)
WBC: 6.7 10*3/uL (ref 4.0–10.5)
nRBC: 1.1 % — ABNORMAL HIGH (ref 0.0–0.2)

## 2020-11-17 LAB — COMPREHENSIVE METABOLIC PANEL
ALT: 19 U/L (ref 0–44)
AST: 23 U/L (ref 15–41)
Albumin: 3.1 g/dL — ABNORMAL LOW (ref 3.5–5.0)
Alkaline Phosphatase: 44 U/L (ref 38–126)
Anion gap: 7 (ref 5–15)
BUN: 65 mg/dL — ABNORMAL HIGH (ref 8–23)
CO2: 28 mmol/L (ref 22–32)
Calcium: 7.9 mg/dL — ABNORMAL LOW (ref 8.9–10.3)
Chloride: 103 mmol/L (ref 98–111)
Creatinine, Ser: 2.02 mg/dL — ABNORMAL HIGH (ref 0.61–1.24)
GFR, Estimated: 34 mL/min — ABNORMAL LOW (ref >60)
Glucose, Bld: 146 mg/dL — ABNORMAL HIGH (ref 70–99)
Potassium: 4.7 mmol/L (ref 3.5–5.1)
Sodium: 138 mmol/L (ref 135–145)
Total Bilirubin: 0.4 mg/dL (ref 0.3–1.2)
Total Protein: 5.6 g/dL — ABNORMAL LOW (ref 6.5–8.1)

## 2020-11-17 LAB — C-REACTIVE PROTEIN: CRP: 1.5 mg/dL — ABNORMAL HIGH (ref <1.0)

## 2020-11-17 LAB — GLUCOSE, CAPILLARY
Glucose-Capillary: 130 mg/dL — ABNORMAL HIGH (ref 70–99)
Glucose-Capillary: 105 mg/dL — ABNORMAL HIGH (ref 70–99)
Glucose-Capillary: 140 mg/dL — ABNORMAL HIGH (ref 70–99)

## 2020-11-17 MED ORDER — MERCAPTOPURINE 50 MG PO TABS
75.0000 mg | ORAL_TABLET | Freq: Every day | ORAL | Status: DC
  Filled 2020-11-17: qty 2

## 2020-11-17 MED ORDER — VITAMIN D3 25 MCG (1000 UT) PO CAPS: 1.0000 | ORAL_CAPSULE | Freq: Every day | ORAL | Status: DC

## 2020-11-17 MED ORDER — FERROUS SULFATE 325 (65 FE) MG PO TABS
325.0000 mg | ORAL_TABLET | Freq: Two times a day (BID) | ORAL | Status: DC
  Administered 2020-11-17 – 2020-11-19 (×4): 325 mg via ORAL
  Filled 2020-11-17 (×4): qty 1

## 2020-11-17 MED ORDER — METHYLPREDNISOLONE SODIUM SUCC 40 MG IJ SOLR
40.0000 mg | Freq: Every day | INTRAMUSCULAR | Status: DC
  Administered 2020-11-18: 40 mg via INTRAVENOUS
  Filled 2020-11-17: qty 1

## 2020-11-17 MED ORDER — ALBUTEROL SULFATE HFA 108 (90 BASE) MCG/ACT IN AERS: 1.0000 | INHALATION_SPRAY | RESPIRATORY_TRACT | Status: DC | PRN

## 2020-11-17 MED ORDER — CALCIUM CARBONATE 1250 (500 CA) MG PO TABS
1.0000 | ORAL_TABLET | Freq: Every day | ORAL | Status: DC
  Administered 2020-11-18 – 2020-11-19 (×2): 500 mg via ORAL
  Filled 2020-11-17 (×2): qty 1

## 2020-11-17 MED ORDER — CALCIUM CARBONATE 600 MG PO TABS: 600.0000 mg | ORAL_TABLET | Freq: Every day | ORAL | Status: DC

## 2020-11-17 MED ORDER — VITAMIN B-12 1000 MCG PO TABS
1000.0000 ug | ORAL_TABLET | Freq: Every day | ORAL | Status: DC
  Administered 2020-11-18 – 2020-11-19 (×2): 1000 ug via ORAL
  Filled 2020-11-17 (×2): qty 1

## 2020-11-17 MED ORDER — VITAMIN D 25 MCG (1000 UNIT) PO TABS
1000.0000 [IU] | ORAL_TABLET | Freq: Every day | ORAL | Status: DC
  Administered 2020-11-17 – 2020-11-19 (×3): 1000 [IU] via ORAL
  Filled 2020-11-17 (×3): qty 1

## 2020-11-17 MED ORDER — IPRATROPIUM-ALBUTEROL 20-100 MCG/ACT IN AERS
1.0000 | INHALATION_SPRAY | Freq: Four times a day (QID) | RESPIRATORY_TRACT | Status: DC
  Administered 2020-11-17 – 2020-11-18 (×5): 1 via RESPIRATORY_TRACT
  Filled 2020-11-17: qty 4

## 2020-11-17 MED ORDER — IRON 325 (65 FE) MG PO TABS: 1.0000 | ORAL_TABLET | Freq: Two times a day (BID) | ORAL | Status: DC

## 2020-11-17 NOTE — Progress Notes (Signed)
PROGRESS NOTE    Cartrell Bentsen  HEN:[id number] DOB: 1946-02-23 DOA: 11/15/2020 PCP: Tenna Child, MD    Brief Narrative:  Mr. Stovall was admitted to the hospital with working diagnosis of acute hypoxic respiratory failure due to SARS COVID-19 viral pneumonia, acute COPD exacerbation   75 year old male past medical of COPD, subarachnoid hemorrhage status post coiling 2013, ulcerative colitis, and chronic kidney disease stage III who presented with shortness of breath.  Reported 2 to 3 days of worsening dyspnea, associated with productive cough, fevers and chills.  On his initial physical examination blood pressure 122/74, 90/51, respiratory heart rate 79, respiratory rate 32, oxygen saturation 98% on 3 L of supplemental oxygen per nasal cannula, his lungs are no wheezing or rhonchi, heart S1-S2, present, rhythmic, abdomen soft, nontender, positive lower extremity edema.  SARS COVID-19 positive  His chest radiograph has hyperinflation, faint interstitial infiltrate left lower lobe, right upper lobe, right lower lobe (personally reviewed).   Patient has been placed on systemic steroids, bronchodilators and remdesivir with good toleration.    Assessment & Plan:   Principal Problem:   Acute respiratory failure with hypoxia and hypercapnia (HCC) Active Problems:   Chronic kidney disease, stage III (moderate) (HCC)   Long-term use of immunosuppressant medication-mercaptopurine    COPD with emphysema  GOLD III   Pneumonia due to COVID-19 virus   Elevated troponin   Acute respiratory failure with hypoxia (HCC)   1. Acute hypoxemic respiratory failure, due to SARS COVID 19 viral pneumonia, complicated with COPD exacerbation.  Dyspnea has been improving, continue to be very weak and deconditioned   RR: 11  Pulse oxymetry: 100  Fi02: 1 L/min per [place]   COVID-19 Labs  Recent Labs    11/15/20 1504 11/15/20 1635 11/16/20 0134 11/17/20 0241  DDIMER 2.45*  --   --   --   FERRITIN   --  158  --   --   LDH  --  181  --   --   CRP  --  1.9* 1.3* 1.5*    Lab Results  Component Value Date   SARSCOV2NAA POSITIVE (A) 11/15/2020   [place] Not Detected 05/13/2019    Plan to continue with systemic steroids, change to methylprednisolone 40 mg daily, continue with aggressive bronchodilator therapy with albuterol-ipratropium scheduled and add as needed albuterol. Add antitussive agents and continue with airway clearing techniques with flutter valve and incentive spirometer.   Continue to follow inflammatory markers, including d dimer.  Check [place] lower extremities rule out DVT.   2. AKI on CKD stage 3a. Renal function with serum cr at 2,0 with K at 4,7 and serum bicarbonate at 28. Continue close monitoring of renal function and electrolytes, avoid hypotension or nephrotoxic medications.  Discontinue IV fluids.   3. Elevated troponin, no acute coronary syndrome. Patient continue to be chest pain free.  Elevation troponin likely related to COVID 19 infection, but patient has risk factors for coronary artery disease.  Check echocardiogram.  Patient at home take 40 mg furosemide daily.   4. Ulcerative colitis. Continue with mercaptopurine   5. Hx of subarachnoid hemorrhage, Sp coiling in 2003.   6. Anemia of chronic disease/iron deficiency. hgb and hct have been stable.  Continue with iron supplementation,   Patient continue to be at high risk for worsening respiratory failure   Status is: Inpatient  Remains inpatient appropriate because:IV treatments appropriate due to intensity of illness or inability to take PO   Dispo: The patient is from:  Home              Anticipated d/c is to: Home              Patient currently is not medically stable to d/c.   Difficult to place patient No   DVT prophylaxis: Enoxaparin   Code Status:   full  Family Communication:   I spoke over the phone with the patient's daughter about patient's  condition, plan of care, prognosis  and all questions were addressed.    Consultants:  Pulmonary   Subjective: Patient with improvement in dyspnea but not yet back to baseline, continue to have worsening symptoms with movement, no nausea or vomiting, no chest pain.   Objective: Vitals:   11/17/20 0500 11/17/20 0600 11/17/20 0800 11/17/20 1200  BP:   138/71 (!) 151/72  Pulse: (!) 58 (!) 30 (!) 58 (!) 56  Resp: (!) 21 20 (!) 21 11  Temp:   97.7 F (36.5 C) 97.6 F (36.4 C)  TempSrc:   Oral Oral  SpO2: 100% 96% 97% 100%  Weight:      Height:        Intake/Output Summary (Last 24 hours) at 11/17/2020 1423 Last data filed at 11/17/2020 1100 Gross per 24 hour  Intake 2024.29 ml  Output 700 ml  Net 1324.29 ml   Filed Weights   11/15/20 1416 11/15/20 1913  Weight: 68.9 kg 67.9 kg    Examination:   General: Not in pain or dyspnea, deconditioned  Neurology: Awake and alert, non focal  E ENT: no pallor, no icterus, oral mucosa moist Cardiovascular: No JVD. S1-S2 present, rhythmic, no gallops, rubs, or murmurs. ++ non pitting bilateral lower extremity edema. Pulmonary: positive breath sounds bilaterally, decrased air movement, with no wheezing, rhonchi or rales. Gastrointestinal. Abdomen soft and non tender Skin. No rashes Musculoskeletal: no joint deformities     Data Reviewed: I have personally reviewed following labs and imaging studies  CBC: Recent Labs  Lab 11/15/20 1502 11/15/20 2357 11/16/20 0134 11/17/20 0241  WBC 5.9 3.9* 3.5* 6.7  NEUTROABS  --   --  2.9 5.5  HGB 11.9* 11.8* 11.7* 11.5*  HCT 37.4* 37.6* 37.9* 36.6*  MCV 110.7* 113.6* 114.5* 112.3*  PLT 221 201 196 161   Basic Metabolic Panel: Recent Labs  Lab 11/15/20 1502 11/15/20 2357 11/16/20 0134 11/17/20 0241  NA 143  --  142 138  K 4.2  --  4.8 4.7  CL 104  --  105 103  CO2 32  --  28 28  GLUCOSE 100*  --  126* 146*  BUN 44*  --  49* 65*  CREATININE 2.31* 2.17* 2.26* 2.02*  CALCIUM 8.9  --  8.2* 7.9*   GFR: Estimated  Creatinine Clearance: 30.3 mL/min (A) (by C-G formula based on SCr of 2.02 mg/dL (H)). Liver Function Tests: Recent Labs  Lab 11/15/20 1502 11/16/20 0134 11/17/20 0241  AST 26 26 23   ALT 15 18 19   ALKPHOS 49 45 44  BILITOT 0.4 0.5 0.4  PROT 6.1* 6.0* 5.6*  ALBUMIN 3.8 3.2* 3.1*   No results for input(s): LIPASE, AMYLASE in the last 168 hours. No results for input(s): AMMONIA in the last 168 hours. Coagulation Profile: No results for input(s): INR, PROTIME in the last 168 hours. Cardiac Enzymes: No results for input(s): CKTOTAL, CKMB, CKMBINDEX, TROPONINI in the last 168 hours. BNP (last 3 results) No results for input(s): PROBNP in the last 8760 hours. HbA1C: No results for  input(s): HGBA1C in the last 72 hours. CBG: Recent Labs  Lab 11/16/20 1134 11/16/20 1737 11/16/20 2349 11/17/20 0521 11/17/20 1156  GLUCAP 121* 181* 137* 130* 105*   Lipid Profile: Recent Labs    11/15/20 1635  TRIG 112   Thyroid Function Tests: No results for input(s): TSH, T4TOTAL, FREET4, T3FREE, THYROIDAB in the last 72 hours. Anemia Panel: Recent Labs    11/15/20 2103  [id number]      Radiology Studies: I have reviewed all of the imaging during this hospital visit personally     Scheduled Meds: . aspirin EC  81 mg Oral Daily  . Chlorhexidine Gluconate Cloth  6 each Topical Daily  . dexamethasone (DECADRON) injection  6 mg Intravenous Q24H  . enoxaparin (LOVENOX) injection  30 mg Subcutaneous Q24H   Continuous Infusions: . sodium chloride 50 mL/hr at 11/16/20 1800  . remdesivir 100 mg in NS 100 mL Stopped (11/17/20 1051)     LOS: 2 days        Antoine Vandermeulen Gerome Apley, MD

## 2020-11-17 NOTE — TOC Initial Note (Signed)
Transition of Care [facility]) - Initial/Assessment Note    Patient Details  Name: Sean Pope MRN: [id number] Date of Birth: 1945/12/10  [facility]) CM/SW Contact:    Leeroy Cha, RN Phone Number: 11/17/2020, 10:46 AM  Clinical Narrative:                 75 yo male former smoker with hx of COPD from emphysema presented to ER on 5/31 with fatigue, hypoxia, dyspnea, cough.  Found to have COVID 19 infection.  ABG showed hypercapnia with mild respiratory acidosis (likely acute on chronic).  Pertinent  Medical History  Vit D deficiency, Ulcerative colitis, CVA, SAH, Osteoporosis, Latent TB s/p tx in 2017, HTN, HLD, CKD 3a, C diff, Anemia, Emphysema  Significant Hospital Events: Including procedures, antibiotic start and stop dates in addition to other pertinent events    5/31 transfer from [place]; start decadron and remdesivir  Interim History / Subjective:  He denies chest pain or sputum.  No abdominal pain.  Has chronic leg swelling.  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  PLan:to return to self care level at home    Expected Discharge Plan: Home/Self Care Barriers to Discharge: Continued Medical Work up   Patient Goals and CMS Choice Patient states their goals for this hospitalization and ongoing recovery are:: to return to home CMS Medicare.gov Compare Post Acute Care list provided to:: Patient    Expected Discharge Plan and Services Expected Discharge Plan: Home/Self Care   Discharge Planning Services: CM Consult   Living arrangements for the past 2 months: Single Family Home                                      Prior Living Arrangements/Services Living arrangements for the past 2 months: Single Family Home Lives with:: Spouse Patient language and need for interpreter reviewed:: Yes Do you feel safe going back to the place where you live?: Yes             Criminal Activity/Legal Involvement Pertinent to Current Situation/Hospitalization: No - Comment as needed  Activities of Daily Living Home Assistive Devices/Equipment: None ADL Screening (condition at time of admission) Patient's cognitive ability adequate to safely complete daily activities?: Yes Is the patient deaf or have difficulty hearing?: No Does the patient have difficulty seeing, even when wearing glasses/contacts?: No Does the patient have difficulty concentrating, remembering, or making decisions?: No Patient able to express need for assistance with ADLs?: Yes Does the patient have difficulty dressing or bathing?: No Independently performs ADLs?: Yes (appropriate for developmental age) Does the patient have difficulty walking or climbing stairs?: No Weakness of Legs: None Weakness of Arms/Hands: None  Permission Sought/Granted                  Emotional Assessment Appearance:: Appears stated age Attitude/Demeanor/Rapport: Engaged Affect (typically observed): Calm Orientation: : Oriented to Place,Oriented to Self,Oriented to  Time,Oriented to Situation Alcohol / Substance Use: Not Applicable Psych Involvement: No (comment)  Admission diagnosis:  Elevated troponin [R77.8] Acute respiratory failure with hypoxia ([place]) [J96.01] Pneumonia due to COVID-19 virus [U07.1, J12.82] COVID-19 [U07.1] Patient Active Problem List   Diagnosis Date Noted  . Pneumonia due to COVID-19 virus 11/15/2020  . Acute respiratory failure with hypoxia and hypercapnia ([place]) 11/15/2020  . Elevated troponin 11/15/2020  .  Acute respiratory failure with hypoxia ([place]) 11/15/2020  . B12 deficiency - borderline 12/21/2014  . Osteoporosis 06/29/2014  . Vitamin D deficiency 06/03/2014  . History of systemic steroid therapy 06/02/2014  . COPD with emphysema  GOLD III 01/20/2014  . Pulmonary infiltrates 01/19/2014  . Long-term use of immunosuppressant medication-mercaptopurine  01/05/2014   . Anemia-multifactorial 01/05/2014  . GERD (gastroesophageal reflux disease) 10/26/2013  . Chronic kidney disease, stage III (moderate) ([place]) 09/08/2013  . Ulcerative chronic pancolitis ([place]) 05/28/2013   PCP:  Tenna Child, MD Pharmacy:   [id number]: [phone]: [phone] [place]Phone: [phone]: [address] [address]Phone: [phone]: [phone]    Social Determinants of Health (SDOH) Interventions    Readmission Risk Interventions No flowsheet data found.

## 2020-11-17 NOTE — Telephone Encounter (Signed)
Please schedule patient for hospital follow up in 2-3 weeks for covid 19 infection and COPD.   Thanks, Wille Glaser

## 2020-11-17 NOTE — Telephone Encounter (Signed)
Spoke with pt and scheduled post hosp appt 6/29-22 3:00. Pt verbalized understanding. Nothing further needed.

## 2020-11-17 NOTE — Progress Notes (Signed)
NAME:  Sean Pope, MRN:  [id number], DOB:  [birth year], LOS: 2 ADMISSION DATE:  11/15/2020, CONSULTATION DATE:  11/16/2020 REFERRING MD:  Dr. Hal Hope, [facility] CHIEF COMPLAINT:  Short of breath   History of Present Illness:  75 yo male former smoker with hx of COPD from emphysema presented to ER on 5/31 with fatigue, hypoxia, dyspnea, cough.  Found to have COVID 19 infection.  ABG showed hypercapnia with mild respiratory acidosis (likely acute on chronic).  Pertinent  Medical History  Vit D deficiency, Ulcerative colitis, CVA, SAH, Osteoporosis, Latent TB s/p tx in 2017, HTN, HLD, CKD 3a, C diff, Anemia, Emphysema  Significant Hospital Events: Including procedures, antibiotic start and stop dates in addition to other pertinent events   . 5/31 transfer from [facility]; start decadron and remdesivir  Interim History / Subjective:   No acute issues over night. Has been on 1L O2. Has remained afebrile since admission.   No desaturations in SpO2 while I examined the patient on room air at rest. He is not needing the inhalers.   His breathing feels fine.   Objective   Blood pressure (!) 115/58, pulse (!) 30, temperature 97.7 F (36.5 C), temperature source Oral, resp. rate 20, height 6' (1.829 m), weight 67.9 kg, SpO2 96 %.        Intake/Output Summary (Last 24 hours) at 11/17/2020 0855 Last data filed at 11/16/2020 2300 Gross per 24 hour  Intake 1419.6 ml  Output 400 ml  Net 1019.6 ml   Filed Weights   11/15/20 1416 11/15/20 1913  Weight: 68.9 kg 67.9 kg    Examination:  General - alert Eyes - pupils reactive, sclera anicteric ENT - moist mucous membranes Cardiac - regular rate/rhythm, no murmur Chest - diminished breath sounds b/l, no wheezing or rales Abdomen - soft, non tender, + bowel sounds Extremities - no cyanosis, clubbing, or edema Skin - no rashes Neuro - alert and oriented, normal strength, moves extremities, follows commands  CXR - hyperinflation,  basilar scarring PFT 04/23/14 >> 1.29 (41%), FEV1% 42, TLC 7.66 (113%), DLCO 38%  6/2 CBC and BMP reviewed. Cr imprving. Resolved Hospital Problem list     Assessment & Plan:   COVID 19 infection - Continue remdesivir and decadron   Acute on chronic hypoxia/hypercapnic respiratory failure in setting of COVID 19 infection with severe COPD from emphysema. - supplemental oxygen to keep SpO2 88 to 95% - f/u CXR as needed - prn combivent - PT evaluation with ambulatory O2 monitoring - May need supplemental O2 at discharge  Hx of ulcerative colitis on mercaptopurine as outpt. - followed by Dr. Carlean Purl with [facility]  CKD 3a. - f/u BMET  PCCM will sign off. Will schedule patient for follow up in our clinic in 4 weeks.   Best practice (right click and "Reselect all SmartList Selections" daily)  Diet:  Oral Pain/Anxiety/Delirium protocol (if indicated): No VAP protocol (if indicated): Not indicated DVT prophylaxis: LMWH GI prophylaxis: N/A Glucose control:  SSI No Central venous access:  N/A Arterial line:  N/A Foley:  N/A Mobility:  OOB  PT consulted: N/A Last date of multidisciplinary goals of care discussion [1] Code Status:  full code Disposition: recommend transfer out of step down unit  Labs   CBC: Recent Labs  Lab 11/15/20 1502 11/15/20 2357 11/16/20 0134 11/17/20 0241  WBC 5.9 3.9* 3.5* 6.7  NEUTROABS  --   --  2.9 5.5  HGB 11.9* 11.8* 11.7* 11.5*  HCT 37.4* 37.6* 37.9* 36.6*  MCV 110.7* 113.6* 114.5* 112.3*  PLT 221 201 196 097    Basic Metabolic Panel: Recent Labs  Lab 11/15/20 1502 11/15/20 2357 11/16/20 0134 11/17/20 0241  NA 143  --  142 138  K 4.2  --  4.8 4.7  CL 104  --  105 103  CO2 32  --  28 28  GLUCOSE 100*  --  126* 146*  BUN 44*  --  49* 65*  CREATININE 2.31* 2.17* 2.26* 2.02*  CALCIUM 8.9  --  8.2* 7.9*   GFR: Estimated Creatinine Clearance: 30.3 mL/min (A) (by C-G formula based on SCr of 2.02 mg/dL (H)). Recent Labs  Lab  11/15/20 1502 11/15/20 1635 11/15/20 1931 11/15/20 2357 11/16/20 0134 11/17/20 0241  PROCALCITON  --  0.10  --  <0.10  --   --   WBC 5.9  --   --  3.9* 3.5* 6.7  LATICACIDVEN  --  0.5 0.7  --   --   --     Liver Function Tests: Recent Labs  Lab 11/15/20 1502 11/16/20 0134 11/17/20 0241  AST 26 26 23   ALT 15 18 19   ALKPHOS 49 45 44  BILITOT 0.4 0.5 0.4  PROT 6.1* 6.0* 5.6*  ALBUMIN 3.8 3.2* 3.1*   No results for input(s): LIPASE, AMYLASE in the last 168 hours. No results for input(s): AMMONIA in the last 168 hours.  ABG    Component Value Date/Time   PHART 7.218 (L) 11/15/2020 2245   PCO2ART 74.1 (HH) 11/15/2020 2245   PO2ART 106 11/15/2020 2245   HCO3 29.0 (H) 11/15/2020 2245   TCO2 27 11/03/2011 1957   ACIDBASEDEF 4.1 (H) 09/17/2011 1745   O2SAT 96.9 11/15/2020 2245     Coagulation Profile: No results for input(s): INR, PROTIME in the last 168 hours.  Cardiac Enzymes: No results for input(s): CKTOTAL, CKMB, CKMBINDEX, TROPONINI in the last 168 hours.  HbA1C: No results found for: HGBA1C  CBG: Recent Labs  Lab 11/16/20 0614 11/16/20 1134 11/16/20 1737 11/16/20 2349 11/17/20 0521  GLUCAP 113* 121* 181* 137* 130*      Signature:  Freda Jackson, MD [facility] Office: [phone]   See Amion for personal pager PCCM on call pager [phone] until 7pm. Please call Elink 7p-7a. [phone]

## 2020-11-18 ENCOUNTER — Inpatient Hospital Stay (HOSPITAL_COMMUNITY): Payer: PPO

## 2020-11-18 DIAGNOSIS — R0609 Other forms of dyspnea: Secondary | ICD-10-CM

## 2020-11-18 LAB — ECHOCARDIOGRAM COMPLETE
Area-P 1/2: 3.99 cm2
Height: 72 in
S' Lateral: 3.6 cm
Weight: 2395.08 oz

## 2020-11-18 LAB — GLUCOSE, CAPILLARY
Glucose-Capillary: 122 mg/dL — ABNORMAL HIGH (ref 70–99)
Glucose-Capillary: 133 mg/dL — ABNORMAL HIGH (ref 70–99)
Glucose-Capillary: 137 mg/dL — ABNORMAL HIGH (ref 70–99)
Glucose-Capillary: 143 mg/dL — ABNORMAL HIGH (ref 70–99)
Glucose-Capillary: 152 mg/dL — ABNORMAL HIGH (ref 70–99)

## 2020-11-18 LAB — D-DIMER, QUANTITATIVE: D-Dimer, Quant: 1.62 ug/mL-FEU — ABNORMAL HIGH (ref 0.00–0.50)

## 2020-11-18 LAB — COMPREHENSIVE METABOLIC PANEL
ALT: 23 U/L (ref 0–44)
AST: 28 U/L (ref 15–41)
Albumin: 3 g/dL — ABNORMAL LOW (ref 3.5–5.0)
Alkaline Phosphatase: 48 U/L (ref 38–126)
Anion gap: 7 (ref 5–15)
BUN: 70 mg/dL — ABNORMAL HIGH (ref 8–23)
CO2: 27 mmol/L (ref 22–32)
Calcium: 7.9 mg/dL — ABNORMAL LOW (ref 8.9–10.3)
Chloride: 103 mmol/L (ref 98–111)
Creatinine, Ser: 1.87 mg/dL — ABNORMAL HIGH (ref 0.61–1.24)
GFR, Estimated: 37 mL/min — ABNORMAL LOW (ref >60)
Glucose, Bld: 131 mg/dL — ABNORMAL HIGH (ref 70–99)
Potassium: 4.3 mmol/L (ref 3.5–5.1)
Sodium: 137 mmol/L (ref 135–145)
Total Bilirubin: 0.7 mg/dL (ref 0.3–1.2)
Total Protein: 5.7 g/dL — ABNORMAL LOW (ref 6.5–8.1)

## 2020-11-18 LAB — FERRITIN: Ferritin: 250 ng/mL (ref 24–336)

## 2020-11-18 LAB — C-REACTIVE PROTEIN: CRP: 1.1 mg/dL — ABNORMAL HIGH (ref <1.0)

## 2020-11-18 MED ORDER — ENOXAPARIN SODIUM 40 MG/0.4ML IJ SOSY
40.0000 mg | PREFILLED_SYRINGE | INTRAMUSCULAR | Status: DC
  Administered 2020-11-18: 40 mg via SUBCUTANEOUS
  Filled 2020-11-18: qty 0.4

## 2020-11-18 MED ORDER — FUROSEMIDE 40 MG PO TABS
40.0000 mg | ORAL_TABLET | Freq: Every day | ORAL | Status: DC
  Administered 2020-11-18 – 2020-11-19 (×2): 40 mg via ORAL
  Filled 2020-11-18 (×2): qty 1

## 2020-11-18 NOTE — Progress Notes (Signed)
Occupational Therapy Evaluation  Patient lives at home with spouse in a two story home, can stay main level. At baseline patient independent at taking care of terminally ill spouse. Patient states they have "a little bit of everything" regarding DME including bedside commode. Currently patient demonstrates ability to perform basic self care tasks without physical assistance, reports minor dyspnea after 3 laps of ambulating with walker in room. Patient desat to 86-87% on 2L (with 2 extensions) after this but recover to 93-94% once seated in chair. Encourage patient to continue to get up to chair and ambulate with nursing to bathroom to build endurance, patient is motivated and wanting to get home to his wife. No further acute OT needs at this time, will sign off. Please re-consult if new needs arise.    11/18/20 1400  OT Visit Information  Last OT Received On 11/18/20  Assistance Needed +1  PT/OT/SLP Co-Evaluation/Treatment Yes  Reason for Co-Treatment To address functional/ADL transfers  OT goals addressed during session ADL's and self-care  History of Present Illness Pt admitted with dx of COVID and with hx of SAH, CVA, CKD, emphysema, and aneurysm coiling of posterior communicating artery.  Precautions  Precautions Fall  Precaution Comments monitor O2  Home Living  Family/patient expects to be discharged to: Private residence  Living Arrangements Spouse/significant other;Children  Available Help at Discharge Family  Type of [place] to enter  Entrance Stairs-Number of Steps 2  Entrance Stairs-Rails Can reach both  [place] Two level;Able to live on main level with bedroom/bathroom  Alternate Level Stairs-Number of Steps 15  Alternate Level Stairs-Rails Right  Bathroom Shower/Tub Walk-in Cytogeneticist Yes  Home Equipment [place] - 2 wheels;Cane - single point;BSC  Additional Comments 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  terminal pulmonary fibrosis who is largely bedbound  Prior Function  Level of Independence Independent  Comments Pt was main caregiver 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  Communication  Communication No difficulties  Pain Assessment  Pain Assessment No/denies pain  Cognition  Arousal/Alertness Awake/alert  Behavior During Therapy WFL for tasks assessed/performed  Overall Cognitive Status Within Functional Limits for tasks assessed  Upper Extremity Assessment  Upper Extremity Assessment Overall WFL for tasks assessed  Lower Extremity Assessment  Lower Extremity Assessment Defer to PT evaluation  Cervical / Trunk Assessment  Cervical / Trunk Assessment Normal  ADL  Overall ADL's  Modified independent  General ADL Comments patient demonstrates ability to pull up socks seated in chair without difficulty, power up to standing from recliner chair without physical assistance and ambulate in room with walker at supervision level for safety primarily due to multiple lines/O2 tubing  Bed Mobility  General bed mobility comments Pt up in chair and requests back to same  Transfers  Overall transfer level Needs assistance  Equipment used Rolling walker (2 wheeled)  Transfers Sit to/from Stand  Sit to Stand Supervision  Balance  Overall balance assessment Needs assistance  Sitting-balance support No upper extremity supported;Feet supported  Sitting balance-Leahy Scale Good  Standing balance support No upper extremity supported  Standing balance-Leahy Scale Fair  General Comments  General comments (skin integrity, edema, etc.) patient on 2L O2, after ~ 3 laps around room patient desat to 86-87% on 2L and reports minimal dyspnea. recovered quickly in sitting back to 93-94%  OT - End of Session  Equipment Utilized During Treatment Oxygen;Rolling walker  Activity Tolerance Patient tolerated treatment well  Patient left in chair;with call  bell/phone within reach  Nurse Communication Mobility status  OT  Assessment  OT Recommendation/Assessment Patient does not need any further OT services  OT Visit Diagnosis Other abnormalities of gait and mobility (R26.89)  OT Problem List Decreased activity tolerance;Cardiopulmonary status limiting activity  AM-PAC OT "6 Clicks" Daily Activity Outcome Measure (Version 2)  Help from another person eating meals? 4  Help from another person taking care of personal grooming? 4  Help from another person toileting, which includes using toliet, bedpan, or urinal? 3  Help from another person bathing (including washing, rinsing, drying)? 4  Help from another person to put on and taking off regular upper body clothing? 4  Help from another person to put on and taking off regular lower body clothing? 4  6 Click Score 23  OT Recommendation  Follow Up Recommendations No OT follow up  OT Equipment None recommended by OT  Acute Rehab OT Goals  Patient Stated Goal go home to spouse  OT Goal Formulation All assessment and education complete, DC therapy  OT Time Calculation  OT Start Time (ACUTE ONLY) 1128  OT Stop Time (ACUTE ONLY) 1200  OT Time Calculation (min) 32 min  OT General Charges  $OT Visit 1 Visit  OT Evaluation  $OT Eval Low Complexity 1 Low  Written Expression  Dominant Hand  (did not specify)   Delbert Phenix OT OT pager: [phone]

## 2020-11-18 NOTE — Evaluation (Signed)
Physical Therapy Evaluation Patient Details Name: Sean Pope MRN: [id number] DOB: [birth year] Today's Date: 11/18/2020   History of Present Illness  Pt admitted with dx of COVID and with hx of SAH, CVA, CKD, emphysema, and aneurysm coiling of posterior communicating artery.  Clinical Impression  Pt admitted with dx of COVID and presenting with functional mobility limitations 2* generalized weakness, mild ambulatory balance deficits, and decreased endurance.  This date, pt up to ambulate max of 75' on 2L O2 and desat to 86% with pt experiencing mild SOB.  Pt states his goal is to return home to help care for his terminally ill wife.    Follow Up Recommendations Home health PT    Equipment Recommendations  None recommended by PT    Recommendations for Other Services       Precautions / Restrictions Precautions Precautions: Fall Restrictions Weight Bearing Restrictions: No Other Position/Activity Restrictions: WBAT      Mobility  Bed Mobility               General bed mobility comments: Pt up in chair and requests back to same    Transfers Overall transfer level: Needs assistance Equipment used: Rolling walker (2 wheeled) Transfers: Sit to/from Stand Sit to Stand: Min guard         General transfer comment: steady assist only; cues for use of UEs to self assist  Ambulation/Gait Ambulation/Gait assistance: Min guard Gait Distance (Feet): 80 Feet (25'. 25' and 80') Assistive device: Rolling walker (2 wheeled) Gait Pattern/deviations: Step-to pattern;Step-through pattern;Decreased step length - right;Decreased step length - left;Shuffle Gait velocity: decreased   General Gait Details: min cues for posture and position from RW.  Stairs            Wheelchair Mobility    Modified Rankin (Stroke Patients Only)       Balance Overall balance assessment: Needs assistance Sitting-balance support: No upper extremity supported;Feet supported Sitting  balance-Leahy Scale: Good     Standing balance support: No upper extremity supported Standing balance-Leahy Scale: Fair                               Pertinent Vitals/Pain Pain Assessment: No/denies pain    Home Living Family/patient expects to be discharged to:: Private residence Living Arrangements: Spouse/significant other;Children Available Help at Discharge: Family Type of Home: House Home Access: Stairs to enter Entrance Stairs-Rails: Can reach both Entrance Stairs-Number of Steps: 2 Home Layout: Two level;Able to live on main level with bedroom/bathroom Home Equipment: [place] - 2 wheels;Cane - single point;Bedside commode Additional Comments: 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 terminal pulmonary fibrosis who is largely bedbound    Prior Function Level of Independence: Independent         Comments: Pt was main caregiver 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     Hand Dominance        Extremity/Trunk Assessment   Upper Extremity Assessment Upper Extremity Assessment: Generalized weakness    Lower Extremity Assessment Lower Extremity Assessment: Generalized weakness    Cervical / Trunk Assessment Cervical / Trunk Assessment: Normal  Communication   Communication: No difficulties  Cognition Arousal/Alertness: Awake/alert Behavior During Therapy: WFL for tasks assessed/performed Overall Cognitive Status: Within Functional Limits for tasks assessed  General Comments      Exercises     Assessment/Plan    PT Assessment Patient needs continued PT services  PT Problem List Decreased strength;Decreased activity tolerance;Decreased balance;Decreased mobility;Decreased knowledge of use of DME       PT Treatment Interventions DME instruction;Gait training;Stair training;Functional mobility training;Therapeutic activities;Therapeutic exercise;Patient/family education;Balance training    PT Goals (Current goals  can be found in the Care Plan section)  Acute Rehab PT Goals Patient Stated Goal: Regain IND PT Goal Formulation: With patient Time For Goal Achievement: 12/02/20 Potential to Achieve Goals: Good    Frequency Min 3X/week   Barriers to discharge Decreased caregiver support Pt is primary caregiver 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 - hopice workers were assisting and dtr assists as able    Co-evaluation PT/OT/SLP Co-Evaluation/Treatment: Yes Reason for Co-Treatment: To address functional/ADL transfers PT goals addressed during session: Mobility/safety with mobility OT goals addressed during session: ADL's and self-care       AM-PAC PT "6 Clicks" Mobility  Outcome Measure Help needed turning from your back to your side while in a flat bed without using bedrails?: None Help needed moving from lying on your back to sitting on the side of a flat bed without using bedrails?: A Little Help needed moving to and from a bed to a chair (including a wheelchair)?: A Little Help needed standing up from a chair using your arms (e.g., wheelchair or bedside chair)?: A Little Help needed to walk in hospital room?: A Little Help needed climbing 3-5 steps with a railing? : A Little 6 Click Score: 19    End of Session Equipment Utilized During Treatment: Gait belt;Oxygen Activity Tolerance: Patient tolerated treatment well;Patient limited by fatigue Patient left: in chair;with call bell/phone within reach Nurse Communication: Mobility status PT Visit Diagnosis: Difficulty in walking, not elsewhere classified (R26.2);Muscle weakness (generalized) (M62.81);Unsteadiness on feet (R26.81)    Time: 4503-8882 PT Time Calculation (min) (ACUTE ONLY): 32 min   Charges:   PT Evaluation $PT Eval Low Complexity: [place] Pager [phone] Office [phone]   Razi Hickle 11/18/2020, 1:06 PM

## 2020-11-18 NOTE — Progress Notes (Signed)
Patient's daughter would like to be called immediately if patient has discharge orders Saturday 11/19/2020, so she can arrange a family member to pick up patient.

## 2020-11-18 NOTE — Progress Notes (Signed)
  Echocardiogram 2D Echocardiogram has been performed.  Sean Pope G Sean Pope 11/18/2020, 1:24 PM

## 2020-11-18 NOTE — Progress Notes (Signed)
PROGRESS NOTE    Sean Pope  PZW:[id number] DOB: 07/27/1945 DOA: 11/15/2020 PCP: Tenna Child, MD    Brief Narrative:  Mr. Shealy was admitted to the hospital with working diagnosis of acute hypoxic respiratory failure due to SARS COVID-19 viral pneumonia, acute COPD exacerbation   75 year old male past medical of COPD, subarachnoid hemorrhage status post coiling 2013, ulcerative colitis, and chronic kidney disease stage III who presented with shortness of breath.  Reported 2 to 3 days of worsening dyspnea, associated with productive cough, fevers and chills.  On his initial physical examination blood pressure 122/74, 90/51, respiratory heart rate 79, respiratory rate 32, oxygen saturation 98% on 3 L of supplemental oxygen per nasal cannula, his lungs are no wheezing or rhonchi, heart S1-S2, present, rhythmic, abdomen soft, nontender, positive lower extremity edema.  SARS COVID-19 positive  His chest radiograph has hyperinflation, faint interstitial infiltrate left lower lobe, right upper lobe, right lower lobe (personally reviewed).   Patient has been placed on systemic steroids, bronchodilators and remdesivir with good toleration.   Clinically improving with plan for discharge home on 06/04. Will need ambulatory oxymetry on room air to assess need for home 02.    Assessment & Plan:   Principal Problem:   Acute respiratory failure with hypoxia and hypercapnia (HCC) Active Problems:   Chronic kidney disease, stage III (moderate) (HCC)   Long-term use of immunosuppressant medication-mercaptopurine    COPD with emphysema  GOLD III   Pneumonia due to COVID-19 virus   Elevated troponin   Acute respiratory failure with hypoxia (HCC)    1. Acute hypoxemic respiratory failure, due to SARS COVID 19 viral pneumonia, complicated with COPD exacerbation.   RR: 27  Pulse oxymetry: 98  Fi02: 2 L/min per [place]   COVID-19 Labs  Recent Labs    11/15/20 1504 11/15/20 1635  11/15/20 1635 11/16/20 0134 11/17/20 0241 11/18/20 0246  DDIMER 2.45*  --   --   --   --  1.62*  FERRITIN  --  158  --   --   --  250  LDH  --  181  --   --   --   --   CRP  --  1.9*   < > 1.3* 1.5* 1.1*   < > = values in this interval not displayed.    Lab Results  Component Value Date   SARSCOV2NAA POSITIVE (A) 11/15/2020   [place] Not Detected 05/13/2019    Symptoms continue to improve.  Continue medical therapy with systemic steroids and remdesivir Bronchodilator therapy and antitussive agents. Airway clearing techniques and increase mobility.  Check ambulatory oxymetry on room air in preparation for possible discharge home in am.  Pending echocardiogram and lower extremity US, resume home dose of furosemide for now.    2. AKI on CKD stage 3a. Serum cr is down to 1,87 with K at 4,3 and serum bicarbonate at 27.  Resume home dose of furosemide 40 mg po daily, continue to avoid nephrotoxic medications and hypotension.    3. Elevated troponin, no acute coronary syndrome.  No chest pain. Follow up on echocardiogram and [place] lower extremities.   4. Ulcerative colitis. Plan to resume mercaptopurine at discharge.  5. Hx of subarachnoid hemorrhage, Sp coiling in 2003.   6. Anemia of chronic disease/iron deficiency. Stable cell count plan for follow up as outpatient.  Continue with oral iron supplementation.  Status is: Inpatient  Remains inpatient appropriate because:Inpatient level of care appropriate due to severity of illness  Dispo: The patient is from: Home              Anticipated d/c is to: Home              Patient currently is not medically stable to d/c. plan for possible dc home in am. Pending ambulatory oxymetry on room air.    Difficult to place patient No   DVT prophylaxis: Enoxaparin   Code Status:   full  Family Communication:  No family at the bedside    Consultants:   Pulmonary    Subjective: Patient is feeling better, dyspnea is  improving but not yet back to baseline, no nausea or vomiting, no chest pain.   Objective: Vitals:   11/18/20 1000 11/18/20 1059 11/18/20 1127 11/18/20 1200  BP: (!) 160/56   (!) 152/72  Pulse:   68   Resp: (!) 23 (!) 25 (!) 27   Temp:   (!) 97.2 F (36.2 C)   TempSrc:   Axillary   SpO2:   98%   Weight:      Height:        Intake/Output Summary (Last 24 hours) at 11/18/2020 1236 Last data filed at 11/18/2020 1100 Gross per 24 hour  Intake 732.92 ml  Output 700 ml  Net 32.92 ml   Filed Weights   11/15/20 1416 11/15/20 1913  Weight: 68.9 kg 67.9 kg    Examination:   General: Not in pain or dyspnea  Neurology: Awake and alert, non focal  E ENT: mild pallor, no icterus, oral mucosa moist Cardiovascular: No JVD. S1-S2 present, rhythmic, no gallops, rubs, or murmurs. No lower extremity edema. Pulmonary: positive  breath sounds bilaterally, with no wheezing, rhonchi or rales.decreased air movement.  Gastrointestinal. Abdomen soft and non tender Skin. No rashes Musculoskeletal: no joint deformities     Data Reviewed: I have personally reviewed following labs and imaging studies  CBC: Recent Labs  Lab 11/15/20 1502 11/15/20 2357 11/16/20 0134 11/17/20 0241  WBC 5.9 3.9* 3.5* 6.7  NEUTROABS  --   --  2.9 5.5  HGB 11.9* 11.8* 11.7* 11.5*  HCT 37.4* 37.6* 37.9* 36.6*  MCV 110.7* 113.6* 114.5* 112.3*  PLT 221 201 196 045   Basic Metabolic Panel: Recent Labs  Lab 11/15/20 1502 11/15/20 2357 11/16/20 0134 11/17/20 0241 11/18/20 0246  NA 143  --  142 138 137  K 4.2  --  4.8 4.7 4.3  CL 104  --  105 103 103  CO2 32  --  28 28 27   GLUCOSE 100*  --  126* 146* 131*  BUN 44*  --  49* 65* 70*  CREATININE 2.31* 2.17* 2.26* 2.02* 1.87*  CALCIUM 8.9  --  8.2* 7.9* 7.9*   GFR: Estimated Creatinine Clearance: 32.8 mL/min (A) (by C-G formula based on SCr of 1.87 mg/dL (H)). Liver Function Tests: Recent Labs  Lab 11/15/20 1502 11/16/20 0134 11/17/20 0241  11/18/20 0246  AST 26 26 23 28   ALT 15 18 19 23   ALKPHOS 49 45 44 48  BILITOT 0.4 0.5 0.4 0.7  PROT 6.1* 6.0* 5.6* 5.7*  ALBUMIN 3.8 3.2* 3.1* 3.0*   No results for input(s): LIPASE, AMYLASE in the last 168 hours. No results for input(s): AMMONIA in the last 168 hours. Coagulation Profile: No results for input(s): INR, PROTIME in the last 168 hours. Cardiac Enzymes: No results for input(s): CKTOTAL, CKMB, CKMBINDEX, TROPONINI in the last 168 hours. BNP (last 3 results) No results for input(s): PROBNP  in the last 8760 hours. HbA1C: No results for input(s): HGBA1C in the last 72 hours. CBG: Recent Labs  Lab 11/17/20 1156 11/17/20 1713 11/18/20 0005 11/18/20 0547 11/18/20 1125  GLUCAP 105* 140* 143* 122* 137*   Lipid Profile: Recent Labs    11/15/20 1635  TRIG 112   Thyroid Function Tests: No results for input(s): TSH, T4TOTAL, FREET4, T3FREE, THYROIDAB in the last 72 hours. Anemia Panel: Recent Labs    11/15/20 1635 11/18/20 0246  FERRITIN 158 250      Radiology Studies: I have reviewed all of the imaging during this hospital visit personally     Scheduled Meds: . aspirin EC  81 mg Oral Daily  . calcium carbonate  1 tablet Oral Q breakfast  . Chlorhexidine Gluconate Cloth  6 each Topical Daily  . cholecalciferol  1,000 Units Oral Daily  . enoxaparin (LOVENOX) injection  40 mg Subcutaneous Q24H  . ferrous sulfate  325 mg Oral BID WC  . Ipratropium-Albuterol  1 puff Inhalation QID  . methylPREDNISolone (SOLU-MEDROL) injection  40 mg Intravenous Daily  . vitamin B-12  1,000 mcg Oral Daily   Continuous Infusions: . remdesivir 100 mg in NS 100 mL Stopped (11/18/20 0936)     LOS: 3 days        Tanijah Morais Gerome Apley, MD

## 2020-11-19 ENCOUNTER — Inpatient Hospital Stay (HOSPITAL_COMMUNITY): Payer: PPO

## 2020-11-19 DIAGNOSIS — R609 Edema, unspecified: Secondary | ICD-10-CM

## 2020-11-19 LAB — COMPREHENSIVE METABOLIC PANEL
ALT: 25 U/L (ref 0–44)
AST: 21 U/L (ref 15–41)
Albumin: 2.9 g/dL — ABNORMAL LOW (ref 3.5–5.0)
Alkaline Phosphatase: 48 U/L (ref 38–126)
Anion gap: 6 (ref 5–15)
BUN: 68 mg/dL — ABNORMAL HIGH (ref 8–23)
CO2: 30 mmol/L (ref 22–32)
Calcium: 7.9 mg/dL — ABNORMAL LOW (ref 8.9–10.3)
Chloride: 101 mmol/L (ref 98–111)
Creatinine, Ser: 1.69 mg/dL — ABNORMAL HIGH (ref 0.61–1.24)
GFR, Estimated: 42 mL/min — ABNORMAL LOW (ref >60)
Glucose, Bld: 126 mg/dL — ABNORMAL HIGH (ref 70–99)
Potassium: 3.7 mmol/L (ref 3.5–5.1)
Sodium: 137 mmol/L (ref 135–145)
Total Bilirubin: 0.9 mg/dL (ref 0.3–1.2)
Total Protein: 5.6 g/dL — ABNORMAL LOW (ref 6.5–8.1)

## 2020-11-19 LAB — GLUCOSE, CAPILLARY
Glucose-Capillary: 121 mg/dL — ABNORMAL HIGH (ref 70–99)
Glucose-Capillary: 83 mg/dL (ref 70–99)

## 2020-11-19 LAB — C-REACTIVE PROTEIN: CRP: 1 mg/dL — ABNORMAL HIGH (ref <1.0)

## 2020-11-19 LAB — D-DIMER, QUANTITATIVE: D-Dimer, Quant: 1.39 ug/mL-FEU — ABNORMAL HIGH (ref 0.00–0.50)

## 2020-11-19 LAB — FERRITIN: Ferritin: 264 ng/mL (ref 24–336)

## 2020-11-19 MED ORDER — IPRATROPIUM-ALBUTEROL 0.5-2.5 (3) MG/3ML IN SOLN: 3.0000 mL | Freq: Four times a day (QID) | RESPIRATORY_TRACT | 0 refills | Status: DC | PRN

## 2020-11-19 MED ORDER — ACETAMINOPHEN 325 MG PO TABS: 650.0000 mg | ORAL_TABLET | Freq: Four times a day (QID) | ORAL | Status: DC | PRN

## 2020-11-19 MED ORDER — ALBUTEROL SULFATE HFA 108 (90 BASE) MCG/ACT IN AERS: 1.0000 | INHALATION_SPRAY | RESPIRATORY_TRACT | 0 refills | Status: DC | PRN

## 2020-11-19 MED ORDER — PREDNISONE 20 MG PO TABS
20.0000 mg | ORAL_TABLET | Freq: Every day | ORAL | Status: DC
  Administered 2020-11-19: 20 mg via ORAL
  Filled 2020-11-19: qty 1

## 2020-11-19 MED ORDER — FUROSEMIDE 40 MG PO TABS: 40.0000 mg | ORAL_TABLET | Freq: Every day | ORAL | Status: DC

## 2020-11-19 MED ORDER — IPRATROPIUM-ALBUTEROL 0.5-2.5 (3) MG/3ML IN SOLN: 3.0000 mL | Freq: Four times a day (QID) | RESPIRATORY_TRACT | Status: DC | PRN

## 2020-11-19 MED ORDER — PREDNISONE 20 MG PO TABS: 20.0000 mg | ORAL_TABLET | Freq: Every day | ORAL | 0 refills | Status: AC

## 2020-11-19 NOTE — Discharge Summary (Addendum)
Physician Discharge Summary  Sean Pope SVX:[id number] DOB: 09-Aug-1945 DOA: 11/15/2020  PCP: Tenna Child, MD  Admit date: 11/15/2020 Discharge date: 11/19/2020  Admitted From: Home Disposition:   Home   Recommendations for Outpatient Follow-up and new medication changes:  1. Follow up with Dr Sabra Heck in 10 days.  2. Continue steroids for 3 more days.   I spoke over the phone with the patient's daughter about patient's  condition, plan of care, prognosis and all questions were addressed.  Home Health: yes  Equipment/Devices: home 02    Discharge Condition: stable  CODE STATUS: full  Diet recommendation:  Heart healthy   Brief/Interim Summary: Sean Pope was admitted to the hospital with working diagnosis of acute hypoxic respiratory failure due to SARS COVID-19 viral pneumonia, acute COPD exacerbation  75 year old male past medical of COPD, subarachnoid hemorrhage status post coiling 2013, ulcerative colitis, and chronic kidney disease stage III who presented with shortness of breath. Reported 2 to 3 days of worsening dyspnea, associated with productive cough, fevers and chills. On his initial physical examination blood pressure 122/74, 90/51, heart rate 79, respiratory rate 32, oxygen saturation 98% on 3 L of supplemental oxygen per nasal cannula, his lungs had no wheezing or rhonchi, heart S1-S2, present, rhythmic, abdomen soft, nontender, positivelower extremity edema.  SARS COVID-19 positive  His chest radiograph had hyperinflation, faint interstitial infiltrate left lower lobe, right upper lobe, right lower lobe(personally reviewed).  Patient was placed on systemic steroids, bronchodilators and remdesivir with good toleration.  Clinically improving with plan for home with supplemental 02 and bronchodilator therapy.   1. Acute hypoxemic respiratory failure due to SARS COVID 19 viral pneumonia, complicated with COPD exacerbation.  Patient was admitted to the  stepdown unit, he received supplemental oxygen per nasal cannula with good toleration.   Medical therapy with systemic corticosteroids, bronchodilators and antiviral therapy with remdesivir. Aggressive airway clearance techniques with incentive spirometer and flutter valve. His condition improved his oxygen saturation is 98 to 100% on 2 L/min of supplemental oxygen per nasal cannula.   COVID-19 Labs  Recent Labs    11/17/20 0241 11/18/20 0246 11/19/20 0225  DDIMER  --  1.62* 1.39*  FERRITIN  --  250 264  CRP 1.5* 1.1* 1.0*    Lab Results  Component Value Date   SARSCOV2NAA POSITIVE (A) 11/15/2020   Spiro Not Detected 05/13/2019    Further work-up with echocardiography showed a left ventricle ejection fraction 55 to 23%, RV systolic function preserved.  Estimated right ventricular systolic pressure 43.  2.  Acute kidney injury on chronic kidney disease stage IIIa.  Patient received supportive medical therapy, his kidney function improved, at discharge serum creatinine 1.69, BUN 68, bicarb 30, potassium 3.7 and sodium 137.  Patient will continue taking furosemide as his usual regimen. Follow-up kidney function as an outpatient.  3.  Troponin elevation, no acute coronary syndrome.  Patient remained chest pain-free, echocardiography had no wall motion abnormalities. Elevation likely due to acute viral infection.  Plan to follow-up as an outpatient.  4.  Ulcerative colitis.  Patient will continue mercaptopurine at discharge.  5.  Anemia of chronic disease, iron deficiency.  Hisada remained stable, continue oral iron supplementation.  6.  History of subarachnoid hemorrhage.  S/p coiling 2013     Discharge Diagnoses:  Principal Problem:   Acute respiratory failure with hypoxia and hypercapnia (HCC) Active Problems:   Chronic kidney disease, stage III (moderate) (HCC)   Long-term use of immunosuppressant medication-mercaptopurine    COPD  with emphysema  GOLD III    Pneumonia due to COVID-19 virus   Elevated troponin   Acute respiratory failure with hypoxia [facility])    Discharge Instructions   Allergies as of 11/19/2020   No Known Allergies     Medication List    TAKE these medications   acetaminophen 325 MG tablet Commonly known as: TYLENOL Take 2 tablets (650 mg total) by mouth every 6 (six) hours as needed for mild pain (or Fever >/= 101).   albuterol 108 (90 Base) MCG/ACT inhaler Commonly known as: VENTOLIN HFA Inhale 1 puff into the lungs every 4 (four) hours as needed for wheezing or shortness of breath.   calcium carbonate 600 MG Tabs tablet Commonly known as: OS-CAL Take 600 mg by mouth daily.   furosemide 40 MG tablet Commonly known as: LASIX Take 1 tablet (40 mg total) by mouth daily.   ipratropium-albuterol 0.5-2.5 (3) MG/3ML Soln Commonly known as: DUONEB Take 3 mLs by nebulization every 6 (six) hours as needed (shortness of berath or wheezing).   Iron 325 (65 Fe) MG Tabs Take 1 tablet by mouth 2 (two) times daily.   MENS MULTIVITAMIN PLUS PO Take 1 tablet by mouth. Centrum Silver   mercaptopurine 50 MG tablet Commonly known as: PURINETHOL Take 1.5 tablets (75 mg total) by mouth daily.   predniSONE 20 MG tablet Commonly known as: DELTASONE Take 1 tablet (20 mg total) by mouth daily with breakfast for 3 days.   vitamin B-12 1000 MCG tablet Commonly known as: CYANOCOBALAMIN Take 1,000 mcg by mouth daily.   Vitamin D3 25 MCG (1000 UT) Caps Take 1 capsule by mouth daily.            Durable Medical Equipment  (From admission, onward)         Start     Ordered   11/19/20 0826  For home use only DME oxygen  Once       Question Answer Comment  Length of Need 6 Months   Mode or (Route) Nasal cannula   Liters per Minute 2   Frequency Continuous (stationary and portable oxygen unit needed)   Oxygen conserving device Yes   Oxygen delivery system Gas      11/19/20 0825   11/19/20 0000  For home use only DME  Nebulizer machine       Question Answer Comment  Patient needs a nebulizer to treat with the following condition Bronchitis   Length of Need 6 Months      11/19/20 0908          No Known Allergies  Consultations:  Pulmonary    Procedures/Studies: DG Chest Port 1 View  Result Date: 11/15/2020 CLINICAL DATA:  Dyspnea.  Cough and congestion EXAM: PORTABLE CHEST 1 VIEW COMPARISON:  06/21/2015 FINDINGS: COPD with pulmonary hyperinflation and emphysema. Mild scarring in the left lung base. Negative for pneumonia.  Negative for heart failure or effusion. IMPRESSION: Advanced COPD without acute abnormality. Electronically Signed   By: Franchot Gallo M.D.   On: 11/15/2020 15:22   ECHOCARDIOGRAM COMPLETE  Result Date: 11/18/2020    ECHOCARDIOGRAM REPORT   Patient Name:   Sean Pope Date of Exam: 11/18/2020 Medical Rec #:  [id number]     Height:       72.0 in Accession #:    [id number]    Weight:       149.7 lb Date of Birth:  Jan 20, 1946     BSA:  1.884 m Patient Age:    34 years      BP:           152/72 mmHg Patient Gender: M             HR:           65 bpm. Exam Location:  Inpatient Procedure: 2D Echo, Cardiac Doppler and Color Doppler Indications:    Dyspnea R06.00  History:        Patient has prior history of Echocardiogram examinations, most                 recent 01/26/2014. Stroke; Risk Factors:Hypertension and                 Dyslipidemia. COVID-19 Positive. CKD.  Sonographer:    Tiffany Dance Referring Phys: [id number] [place]  1. Left ventricular ejection fraction, by estimation, is 55 to 60%. The left ventricle has normal function. The left ventricle has no regional wall motion abnormalities. Left ventricular diastolic parameters are consistent with Grade I diastolic dysfunction (impaired relaxation).  2. Right ventricular systolic function is normal. The right ventricular size is normal. There is mildly elevated pulmonary artery systolic pressure. The  estimated right ventricular systolic pressure is 25.6 mmHg.  3. The mitral valve is grossly normal. Trivial mitral valve regurgitation. No evidence of mitral stenosis.  4. The aortic valve is tricuspid. There is mild calcification of the aortic valve. There is moderate thickening of the aortic valve. Aortic valve regurgitation is not visualized. Mild to moderate aortic valve sclerosis/calcification is present, without any evidence of aortic stenosis.  5. The inferior vena cava is normal in size with greater than 50% respiratory variability, suggesting right atrial pressure of 3 mmHg. Comparison(s): No significant change from prior study. FINDINGS  Left Ventricle: Left ventricular ejection fraction, by estimation, is 55 to 60%. The left ventricle has normal function. The left ventricle has no regional wall motion abnormalities. The left ventricular internal cavity size was normal in size. There is  no left ventricular hypertrophy. Left ventricular diastolic parameters are consistent with Grade I diastolic dysfunction (impaired relaxation). Right Ventricle: The right ventricular size is normal. No increase in right ventricular wall thickness. Right ventricular systolic function is normal. There is mildly elevated pulmonary artery systolic pressure. The tricuspid regurgitant velocity is 2.96  m/s, and with an assumed right atrial pressure of 8 mmHg, the estimated right ventricular systolic pressure is 38.9 mmHg. Left Atrium: Left atrial size was normal in size. Right Atrium: Right atrial size was normal in size. Pericardium: There is no evidence of pericardial effusion. Mitral Valve: The mitral valve is grossly normal. There is mild thickening of the mitral valve leaflet(s). There is mild calcification of the mitral valve leaflet(s). Mild mitral annular calcification. Trivial mitral valve regurgitation. No evidence of mitral valve stenosis. Tricuspid Valve: The tricuspid valve is normal in structure. Tricuspid valve  regurgitation is trivial. Aortic Valve: The aortic valve is tricuspid. There is mild calcification of the aortic valve. There is moderate thickening of the aortic valve. Aortic valve regurgitation is not visualized. Mild to moderate aortic valve sclerosis/calcification is present, without any evidence of aortic stenosis. Pulmonic Valve: The pulmonic valve was normal in structure. Pulmonic valve regurgitation is trivial. Aorta: The aortic root and ascending aorta are structurally normal, with no evidence of dilitation. Venous: The inferior vena cava is normal in size with greater than 50% respiratory variability, suggesting right atrial pressure of 3 mmHg. IAS/Shunts: No  atrial level shunt detected by color flow Doppler.  LEFT VENTRICLE PLAX 2D LVIDd:         5.00 cm LVIDs:         3.60 cm LV PW:         1.00 cm LV IVS:        0.80 cm LVOT diam:     2.00 cm LV SV:         72 LV SV Index:   38 LVOT Area:     3.14 cm  RIGHT VENTRICLE          IVC RV Basal diam:  3.60 cm  IVC diam: 2.30 cm RV Mid diam:    2.10 cm TAPSE (M-mode): 2.9 cm LEFT ATRIUM           Index       RIGHT ATRIUM           Index LA diam:      3.50 cm 1.86 cm/m  RA Area:     18.50 cm LA Vol (A2C): 75.5 ml 40.08 ml/m RA Volume:   55.10 ml  29.25 ml/m LA Vol (A4C): 39.6 ml 21.02 ml/m  AORTIC VALVE LVOT Vmax:   113.67 cm/s LVOT Vmean:  73.433 cm/s LVOT VTI:    0.228 m  AORTA Ao Root diam: 3.20 cm Ao Asc diam:  3.30 cm MITRAL VALVE               TRICUSPID VALVE MV Area (PHT): 3.99 cm    TR Peak grad:   35.0 mmHg MV Decel Time: 190 msec    TR Vmax:        296.00 cm/s MV E velocity: 74.10 cm/s MV A velocity: 78.30 cm/s  SHUNTS MV E/A ratio:  0.95        Systemic VTI:  0.23 m                            Systemic Diam: 2.00 cm Sean Kaufman MD Electronically signed by Sean Kaufman MD Signature Date/Time: 11/18/2020/1:47:34 PM    Final       Subjective: Patient is feeling better, no nausea or vomiting, dyspnea continue to improve.    Discharge Exam: Vitals:   11/19/20 0800 11/19/20 0837  BP: 132/68   Pulse: (!) 49   Resp: 20   Temp:  97.9 F (36.6 C)  SpO2: 100%    Vitals:   11/19/20 0625 11/19/20 0700 11/19/20 0800 11/19/20 0837  BP: (!) 143/67  132/68   Pulse: (!) 54 62 (!) 49   Resp: 20 19 20    Temp:    97.9 F (36.6 C)  TempSrc:    Oral  SpO2: 100% 100% 100%   Weight:      Height:        General: Not in pain or dyspnea.  Neurology: Awake and alert, non focal  E ENT: no pallor, no icterus, oral mucosa moist Cardiovascular: No JVD. S1-S2 present, rhythmic, no gallops, rubs, or murmurs. No lower extremity edema. Pulmonary: positive breath sounds bilaterally, decreased air movement, with no wheezing, rhonchi or rales. Gastrointestinal. Abdomen soft and non tender Skin. No rashes Musculoskeletal: no joint deformities   The results of significant diagnostics from this hospitalization (including imaging, microbiology, ancillary and laboratory) are listed below for reference.     Microbiology: Recent Results (from the past 240 hour(s))  Resp Panel by RT-PCR (Flu A&B, Covid) Nasopharyngeal Swab  Status: Abnormal   Collection Time: 11/15/20  3:02 PM   Specimen: Nasopharyngeal Swab; Nasopharyngeal(NP) swabs in vial transport medium  Result Value Ref Range Status   SARS Coronavirus 2 by RT PCR POSITIVE (A) NEGATIVE Final    Comment: RESULT CALLED TO, READ BACK BY AND VERIFIED WITH: TATA CARBONNE, 1622, 11/15/20, [place] (NOTE) SARS-CoV-2 target nucleic acids are DETECTED.  The SARS-CoV-2 RNA is generally detectable in upper respiratory specimens during the acute phase of infection. Positive results are indicative of the presence of the identified virus, but do not rule out bacterial infection or co-infection with other pathogens not detected by the test. Clinical correlation with patient history and other diagnostic information is necessary to determine patient infection status. The expected result  is Negative.  Fact Sheet for Patients: EntrepreneurPulse.com.au  Fact Sheet for Healthcare Providers: IncredibleEmployment.be  This test is not yet approved or cleared by the [place] FDA and  has been authorized for detection and/or diagnosis of SARS-CoV-2 by FDA under an Emergency Use Authorization (EUA).  This EUA will remain in effect (meaning this test can be  used) for the duration of  the COVID-19 declaration under Section 564(b)(1) of the Act, 21 U.S.C. section 360bbb-3(b)(1), unless the authorization is terminated or revoked sooner.     Influenza A by PCR NEGATIVE NEGATIVE Final   Influenza B by PCR NEGATIVE NEGATIVE Final    Comment: (NOTE) The Xpert Xpress SARS-CoV-2/FLU/RSV plus assay is intended as an aid in the diagnosis of influenza from Nasopharyngeal swab specimens and should not be used as a sole basis for treatment. Nasal washings and aspirates are unacceptable for Xpert Xpress SARS-CoV-2/FLU/RSV testing.  Fact Sheet for Patients: EntrepreneurPulse.com.au  Fact Sheet for Healthcare Providers: IncredibleEmployment.be  This test is not yet approved or cleared by the [place] FDA and has been authorized for detection and/or diagnosis of SARS-CoV-2 by FDA under an Emergency Use Authorization (EUA). This EUA will remain in effect (meaning this test can be used) for the duration of the COVID-19 declaration under Section 564(b)(1) of the Act, 21 U.S.C. section 360bbb-3(b)(1), unless the authorization is terminated or revoked.  Performed at [facility], [address], [place], [place] [address]   Blood Culture (routine x 2)     Status: None (Preliminary result)   Collection Time: 11/15/20  3:04 PM   Specimen: BLOOD  Result Value Ref Range Status   Specimen Description   Final    BLOOD Performed at [facility], [address]  [place], [place] [address]    Special Requests   Final    BOTTLES DRAWN AEROBIC AND ANAEROBIC Blood Culture adequate volume LEFT ANTECUBITAL Performed at [facility], [address] [place], [place] [address]    Culture   Final    NO GROWTH 3 DAYS Performed at [facility], [place] [address], [place], [place] [address]    Report Status PENDING  Incomplete  MRSA PCR Screening     Status: None   Collection Time: 11/15/20  7:13 PM   Specimen: Nasal Mucosa; Nasopharyngeal  Result Value Ref Range Status   MRSA by PCR NEGATIVE NEGATIVE Final    Comment:        The GeneXpert MRSA Assay (FDA approved for NASAL specimens only), is one component of a comprehensive MRSA colonization surveillance program. It is not intended to diagnose MRSA infection nor to guide or monitor treatment for MRSA infections. Performed at [facility], [address]  [place], [place] [address]   Blood Culture (routine x 2)     Status: None (Preliminary result)   Collection Time: 11/15/20  7:31 PM   Specimen: BLOOD  Result Value Ref Range Status   Specimen Description   Final    BLOOD RIGHT ANTECUBITAL Performed at [place] [address] [place], [place] [address]    Special Requests   Final    BOTTLES DRAWN AEROBIC ONLY Blood Culture adequate volume Performed at [facility] [address], [place], [place] [address]    Culture   Final    NO GROWTH 2 DAYS Performed at [facility] [address], [place], [place] [address]    Report Status PENDING  Incomplete     Labs: BNP (last 3 results) Recent Labs    11/15/20 1502  BNP 494.4*   Basic Metabolic Panel: Recent Labs  Lab 11/15/20 1502 11/15/20 2357 11/16/20 0134 11/17/20 0241 11/18/20 0246 11/19/20 0225  NA 143  --  142 138 137 137  K 4.2  --  4.8 4.7 4.3 3.7  CL 104  --  105 103 103 101  CO2 32  --  28 28 27 30   GLUCOSE 100*  --  126* 146*  131* 126*  BUN 44*  --  49* 65* 70* 68*  CREATININE 2.31* 2.17* 2.26* 2.02* 1.87* 1.69*  CALCIUM 8.9  --  8.2* 7.9* 7.9* 7.9*   Liver Function Tests: Recent Labs  Lab 11/15/20 1502 11/16/20 0134 11/17/20 0241 11/18/20 0246 11/19/20 0225  AST 26 26 23 28 21   ALT 15 18 19 23 25   ALKPHOS 49 45 44 48 48  BILITOT 0.4 0.5 0.4 0.7 0.9  PROT 6.1* 6.0* 5.6* 5.7* 5.6*  ALBUMIN 3.8 3.2* 3.1* 3.0* 2.9*   No results for input(s): LIPASE, AMYLASE in the last 168 hours. No results for input(s): AMMONIA in the last 168 hours. CBC: Recent Labs  Lab 11/15/20 1502 11/15/20 2357 11/16/20 0134 11/17/20 0241  WBC 5.9 3.9* 3.5* 6.7  NEUTROABS  --   --  2.9 5.5  HGB 11.9* 11.8* 11.7* 11.5*  HCT 37.4* 37.6* 37.9* 36.6*  MCV 110.7* 113.6* 114.5* 112.3*  PLT 221 201 196 214   Cardiac Enzymes: No results for input(s): CKTOTAL, CKMB, CKMBINDEX, TROPONINI in the last 168 hours. BNP: Invalid input(s): POCBNP CBG: Recent Labs  Lab 11/18/20 0005 11/18/20 0547 11/18/20 1125 11/18/20 1647 11/18/20 2319  GLUCAP 143* 122* 137* 133* 152*   D-Dimer Recent Labs    11/18/20 0246 11/19/20 0225  DDIMER 1.62* 1.39*   Hgb A1c No results for input(s): HGBA1C in the last 72 hours. Lipid Profile No results for input(s): CHOL, HDL, LDLCALC, TRIG, CHOLHDL, LDLDIRECT in the last 72 hours. Thyroid function studies No results for input(s): TSH, T4TOTAL, T3FREE, THYROIDAB in the last 72 hours.  Invalid input(s): FREET3 Anemia work up Recent Labs    11/18/20 0246 11/19/20 0225  FERRITIN 250 264   Urinalysis    Component Value Date/Time   COLORURINE YELLOW 01/08/2014 0120   APPEARANCEUR CLOUDY (A) 01/08/2014 0120   LABSPEC 1.020 01/08/2014 0120   PHURINE 5.5 01/08/2014 0120   GLUCOSEU NEGATIVE 01/08/2014 0120   GLUCOSEU NEGATIVE 10/20/2013 0855   HGBUR NEGATIVE 01/08/2014 0120   BILIRUBINUR NEGATIVE 01/08/2014 0120   KETONESUR NEGATIVE 01/08/2014 0120   PROTEINUR NEGATIVE 01/08/2014 0120    UROBILINOGEN 0.2 01/08/2014 0120   NITRITE NEGATIVE 01/08/2014 0120   LEUKOCYTESUR NEGATIVE 01/08/2014 0120  Sepsis Labs Invalid input(s): PROCALCITONIN,  WBC,  LACTICIDVEN Microbiology Recent Results (from the past 240 hour(s))  Resp Panel by RT-PCR (Flu A&B, Covid) Nasopharyngeal Swab     Status: Abnormal   Collection Time: 11/15/20  3:02 PM   Specimen: Nasopharyngeal Swab; Nasopharyngeal(NP) swabs in vial transport medium  Result Value Ref Range Status   SARS Coronavirus 2 by RT PCR POSITIVE (A) NEGATIVE Final    Comment: RESULT CALLED TO, READ BACK BY AND VERIFIED WITH: TATA CARBONNE, 1622, 11/15/20, [place] (NOTE) SARS-CoV-2 target nucleic acids are DETECTED.  The SARS-CoV-2 RNA is generally detectable in upper respiratory specimens during the acute phase of infection. Positive results are indicative of the presence of the identified virus, but do not rule out bacterial infection or co-infection with other pathogens not detected by the test. Clinical correlation with patient history and other diagnostic information is necessary to determine patient infection status. The expected result is Negative.  Fact Sheet for Patients: EntrepreneurPulse.com.au  Fact Sheet for Healthcare Providers: IncredibleEmployment.be  This test is not yet approved or cleared by the [place] FDA and  has been authorized for detection and/or diagnosis of SARS-CoV-2 by FDA under an Emergency Use Authorization (EUA).  This EUA will remain in effect (meaning this test can be  used) for the duration of  the COVID-19 declaration under Section 564(b)(1) of the Act, 21 U.S.C. section 360bbb-3(b)(1), unless the authorization is terminated or revoked sooner.     Influenza A by PCR NEGATIVE NEGATIVE Final   Influenza B by PCR NEGATIVE NEGATIVE Final    Comment: (NOTE) The Xpert Xpress SARS-CoV-2/FLU/RSV plus assay is intended as an aid in the diagnosis of influenza  from Nasopharyngeal swab specimens and should not be used as a sole basis for treatment. Nasal washings and aspirates are unacceptable for Xpert Xpress SARS-CoV-2/FLU/RSV testing.  Fact Sheet for Patients: EntrepreneurPulse.com.au  Fact Sheet for Healthcare Providers: IncredibleEmployment.be  This test is not yet approved or cleared by the [place] FDA and has been authorized for detection and/or diagnosis of SARS-CoV-2 by FDA under an Emergency Use Authorization (EUA). This EUA will remain in effect (meaning this test can be used) for the duration of the COVID-19 declaration under Section 564(b)(1) of the Act, 21 U.S.C. section 360bbb-3(b)(1), unless the authorization is terminated or revoked.  Performed at [facility], [address], [place], [place] [address]   Blood Culture (routine x 2)     Status: None (Preliminary result)   Collection Time: 11/15/20  3:04 PM   Specimen: BLOOD  Result Value Ref Range Status   Specimen Description   Final    BLOOD Performed at [facility], [address] [place], [place] [address]    Special Requests   Final    BOTTLES DRAWN AEROBIC AND ANAEROBIC Blood Culture adequate volume LEFT ANTECUBITAL Performed at [facility], [address] [place], [place] [address]    Culture   Final    NO GROWTH 3 DAYS Performed at [facility], [place] [address], [place], [place] [address]    Report Status PENDING  Incomplete  MRSA PCR Screening     Status: None   Collection Time: 11/15/20  7:13 PM   Specimen: Nasal Mucosa; Nasopharyngeal  Result Value Ref Range Status   MRSA by PCR NEGATIVE NEGATIVE Final    Comment:        The GeneXpert MRSA Assay (FDA approved for NASAL specimens only), is one component of a comprehensive MRSA  colonization surveillance program. It is not intended to diagnose MRSA infection nor to guide or monitor  treatment for MRSA infections. Performed at [facility], [place] [address], [place], [place] [address]   Blood Culture (routine x 2)     Status: None (Preliminary result)   Collection Time: 11/15/20  7:31 PM   Specimen: BLOOD  Result Value Ref Range Status   Specimen Description   Final    BLOOD RIGHT ANTECUBITAL Performed at [facility] [address], [place], [place] [address]    Special Requests   Final    BOTTLES DRAWN AEROBIC ONLY Blood Culture adequate volume Performed at [facility] [address] [place], [place] [address]    Culture   Final    NO GROWTH 2 DAYS Performed at [facility] [address] [place], [place] [address]    Report Status PENDING  Incomplete     Time coordinating discharge: 45 minutes  SIGNED:   Tawni Millers, MD  [facility] 11/19/2020, 8:51 AM

## 2020-11-19 NOTE — Progress Notes (Signed)
SATURATION QUALIFICATIONS: (This note is used to comply with regulatory documentation for home oxygen)  Patient Saturations on Room Air at Rest = 87%  Patient Saturations on Room Air while Ambulating = 82%  Patient Saturations on 2 Liters of oxygen while Ambulating = 91%  Please briefly explain why patient needs home oxygen: Pt desats while at rest and on room air.

## 2020-11-19 NOTE — Progress Notes (Signed)
Bilateral lower extremity venous duplex has been completed. Preliminary results can be found in CV Proc through chart review.   11/19/20 1:28 PM Carlos Levering RVT

## 2020-11-19 NOTE — TOC Progression Note (Signed)
Transition of Care [facility]) - Progression Note    Patient Details  Name: Sean Pope MRN: [id number] Date of Birth: [birth year]  Transition of Care [facility]) CM/SW Contact  Purcell Mouton, RN Phone Number: 11/19/2020, 12:29 PM  Clinical Narrative:    [employer] waiting for desaturation note. To deliver O2.    Expected Discharge Plan: Home/Self Care Barriers to Discharge: Continued Medical Work up  Expected Discharge Plan and Services Expected Discharge Plan: Home/Self Care   Discharge Planning Services: CM Consult   Living arrangements for the past 2 months: Single Family Home Expected Discharge Date: 11/19/20                                     Social Determinants of Health (SDOH) Interventions    Readmission Risk Interventions No flowsheet data found.

## 2020-11-19 NOTE — TOC Progression Note (Signed)
Transition of Care [facility]) - Progression Note    Patient Details  Name: Joshual Terrio MRN: [id number] Date of Birth: 27-Feb-1946  Transition of Care [facility]) CM/SW Contact  Purcell Mouton, RN Phone Number: 11/19/2020, 11:57 AM  Clinical Narrative:     Oxygen referral given to Zach in house rep with  [employer], to be delivered to pt's room.   Expected Discharge Plan: Home/Self Care Barriers to Discharge: Continued Medical Work up  Expected Discharge Plan and Services Expected Discharge Plan: Home/Self Care   Discharge Planning Services: CM Consult   Living arrangements for the past 2 months: Single Family Home Expected Discharge Date: 11/19/20                                     Social Determinants of Health (SDOH) Interventions    Readmission Risk Interventions No flowsheet data found.

## 2020-11-20 LAB — CULTURE, BLOOD (ROUTINE X 2): Culture: NO GROWTH

## 2020-11-21 LAB — GLUCOSE, CAPILLARY: Glucose-Capillary: 104 mg/dL — ABNORMAL HIGH (ref 70–99)

## 2020-11-21 LAB — CULTURE, BLOOD (ROUTINE X 2)
Culture: NO GROWTH
Special Requests: ADEQUATE

## 2020-12-14 ENCOUNTER — Encounter: Payer: Self-pay | Admitting: Pulmonary Disease

## 2020-12-14 ENCOUNTER — Ambulatory Visit: Payer: PPO | Admitting: Pulmonary Disease

## 2020-12-14 ENCOUNTER — Other Ambulatory Visit: Payer: Self-pay

## 2020-12-14 VITALS — BP 110/70 | HR 75 | Ht 70.0 in | Wt 147.2 lb

## 2020-12-14 DIAGNOSIS — J9601 Acute respiratory failure with hypoxia: Secondary | ICD-10-CM | POA: Diagnosis not present

## 2020-12-14 DIAGNOSIS — U071 COVID-19: Secondary | ICD-10-CM

## 2020-12-14 DIAGNOSIS — J1282 Pneumonia due to coronavirus disease 2019: Secondary | ICD-10-CM | POA: Diagnosis not present

## 2020-12-14 DIAGNOSIS — J432 Centrilobular emphysema: Secondary | ICD-10-CM

## 2020-12-14 MED ORDER — BREZTRI AEROSPHERE 160-9-4.8 MCG/ACT IN AERO: 2.0000 | INHALATION_SPRAY | Freq: Two times a day (BID) | RESPIRATORY_TRACT | 0 refills | Status: DC

## 2020-12-14 MED ORDER — BREZTRI AEROSPHERE 160-9-4.8 MCG/ACT IN AERO: 2.0000 | INHALATION_SPRAY | Freq: Two times a day (BID) | RESPIRATORY_TRACT | 6 refills | Status: DC

## 2020-12-14 NOTE — Progress Notes (Signed)
Synopsis: Referred in June 2022 for COPD/Emphysema  Subjective:   PATIENT ID: Sean Pope GENDER: male DOB: 11/05/1945, MRN: [id number]   HPI  Chief Complaint  Patient presents with   Follow-up    Patient reports SOB on exertion. He was told at the hospital was told he has emphysema and COPD. He uses oxygen as needed.      Sean Pope is a 75 year old male, former smoker with severe COPD/Emphysema and recent hospitalization due to covid 19 pneumonia with acute hypoxemic respiratory failure who returns to pulmonary clinic for follow up.   He reports his breathing is close to being back to baseline.  He denies any cough, sputum production or wheezing.  He is using DuoNeb nebulizer solutions as needed.  He reports significant limitations to his daily activities due to the exertional dyspnea.  He is accompanied by his daughter today and she is asking if he should be on any maintenance inhaler therapy for his COPD/emphysema.  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  He notes a significant decrease in his activity as he has remained around the house to care for his wife.  Past Medical History:  Diagnosis Date   Acute respiratory failure with hypoxia ([place]) 09/17/2011   AKI (acute kidney injury) ([place])    In the setting of ulcerative colitis flare, question relationship to mesalamine   Anal fissure - posterior 01/22/2014   Anemia-multifactorial 01/05/2014   Blood loss, medications, chronic illness kidney injury   B12 deficiency - borderline 12/21/2014   C. difficile colitis 06/02/2014   Chronic kidney disease, stage III (moderate) ([place]) 09/08/2013   Emphysema lung ([place])    Hyperlipidemia    Hypertension    Latent tuberculosis    Long-term use of immunosuppressant medication-mercaptopurine and Humira 01/05/2014   Lung collapse 09/26/2011   Osteoporosis 06/29/2014   DEXA 06/2014 - lowest t  score -4.2   Posterior communicating artery aneurysm 09/17/2011   Protein-calorie malnutrition, severe ([place]) 10/26/2013   Respiratory failure, acute ([place]) 01/07/2014   SAH (subarachnoid hemorrhage) ([place])    Stroke ([place])    Ulcerative colitis ([place]) 05/28/2013   Vitamin D deficiency 06/03/2014     Family History  Problem Relation Age of Onset   Heart disease Mother      Social History   Socioeconomic History   Marital status: Married    Spouse name: Not on file   Number of children: 2   Years of education: Not on file   Highest education level: Not on file  Occupational History   Occupation: Mudlogger    Comment: [employer]    Employer: RETIRED  Tobacco Use   Smoking status: Former    Packs/day: 0.50    Years: 50.00    Pack years: 25.00    Types: Cigarettes    Quit date: 06/19/2011    Years since quitting: 9.4   Smokeless tobacco: Never  Vaping Use   Vaping Use: Former  Substance and Sexual Activity   Alcohol use: Yes    Alcohol/week: 0.0 standard drinks    Comment: rare   Drug use: No   Sexual activity: Not Currently  Other Topics Concern   Not on file  Social History Narrative   Married, 1 son and 1 daughter   Nurse, 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     albuterol (VENTOLIN HFA) 108 (90 Base) MCG/ACT inhaler Inhale 1 puff into the lungs every 4 (four) hours as needed for wheezing or shortness of breath. 1  each 0   calcium carbonate (OS-CAL) 600 MG TABS tablet Take 600 mg by mouth daily.     Cholecalciferol (VITAMIN D3) 1000 units CAPS Take 1 capsule by mouth daily.     Ferrous Sulfate (IRON) 325 (65 FE) MG TABS Take 1 tablet by mouth 2 (two) times daily.      furosemide (LASIX) 40 MG tablet Take 1 tablet (40 mg total) by mouth daily.     ipratropium-albuterol (DUONEB) 0.5-2.5 (3) MG/3ML SOLN Take 3 mLs by nebulization every 6 (six) hours as needed (shortness of berath or wheezing). 360 mL 0   mercaptopurine (PURINETHOL) 50 MG tablet Take 1.5 tablets (75 mg total) by mouth daily. 135 tablet 3   Multiple Vitamins-Minerals (MENS MULTIVITAMIN PLUS PO) Take 1 tablet by mouth. Centrum Silver     vitamin B-12 (CYANOCOBALAMIN) 1000 MCG tablet Take 1,000 mcg by mouth daily.     No facility-administered medications prior to visit.    Review of Systems  Constitutional:  Negative for chills, fever, malaise/fatigue and weight loss.  HENT:  Negative for congestion, sinus pain and sore throat.   Eyes: Negative.   Respiratory:  Positive for shortness of breath. Negative for cough, hemoptysis, sputum production and wheezing.   Cardiovascular:  Negative for chest pain, palpitations, orthopnea, claudication and leg swelling.  Gastrointestinal:  Negative for abdominal pain, heartburn, nausea and vomiting.  Genitourinary: Negative.   Musculoskeletal:  Negative for joint pain and myalgias.  Skin:  Negative for rash.  Neurological:  Negative for weakness.  Endo/Heme/Allergies: Negative.   Psychiatric/Behavioral: Negative.     Objective:   Vitals:   12/14/20 1445  BP: 110/70  Pulse: 75  SpO2: 96%  Weight: 147 lb 3.2 oz (66.8 kg)  Height: 5' 10"  (1.778 m)   Physical Exam Constitutional:      General: He is not in acute distress.    Appearance: Normal appearance.  HENT:     Head: Normocephalic and atraumatic.  Eyes:     Extraocular Movements: Extraocular movements intact.     Conjunctiva/sclera:  Conjunctivae normal.     Pupils: Pupils are equal, round, and reactive to light.  Cardiovascular:     Rate and Rhythm: Normal rate and regular rhythm.     Pulses: Normal pulses.     Heart sounds: Normal heart sounds. No murmur heard. Pulmonary:     Effort: Pulmonary effort is normal.     Breath sounds: Decreased breath sounds present. No wheezing, rhonchi or rales.  Abdominal:     General: Bowel sounds are normal.     Palpations: Abdomen is soft.  Musculoskeletal:     Right lower leg: No edema.     Left lower leg: No edema.  Lymphadenopathy:     Cervical: No  cervical adenopathy.  Skin:    General: Skin is warm and dry.  Neurological:     General: No focal deficit present.     Mental Status: He is alert.  Psychiatric:        Mood and Affect: Mood normal.        Behavior: Behavior normal.        Thought Content: Thought content normal.        Judgment: Judgment normal.   CBC    Component Value Date/Time   WBC 6.7 11/17/2020 0241   RBC 3.26 (L) 11/17/2020 0241   HGB 11.5 (L) 11/17/2020 0241   HCT 36.6 (L) 11/17/2020 0241   PLT 214 11/17/2020 0241   MCV 112.3 (H) 11/17/2020 0241   MCH 35.3 (H) 11/17/2020 0241   MCHC 31.4 11/17/2020 0241   RDW 17.2 (H) 11/17/2020 0241   LYMPHSABS 0.6 (L) 11/17/2020 0241   MONOABS 0.5 11/17/2020 0241   EOSABS 0.0 11/17/2020 0241   BASOSABS 0.0 11/17/2020 0241   BMP Latest Ref Rng & Units 11/19/2020 11/18/2020 11/17/2020  Glucose 70 - 99 mg/dL 126(H) 131(H) 146(H)  BUN 8 - 23 mg/dL 68(H) 70(H) 65(H)  Creatinine 0.61 - 1.24 mg/dL 1.69(H) 1.87(H) 2.02(H)  Sodium 135 - 145 mmol/L 137 137 138  Potassium 3.5 - 5.1 mmol/L 3.7 4.3 4.7  Chloride 98 - 111 mmol/L 101 103 103  CO2 22 - 32 mmol/L 30 27 28   Calcium 8.9 - 10.3 mg/dL 7.9(L) 7.9(L) 7.9(L)   Chest imaging: CXR 11/15/20 COPD with pulmonary hyperinflation and emphysema. Mild scarring in the left lung base.  PFT: PFT Results Latest Ref Rng & Units 04/23/2014  FVC-Pre L 2.86  FVC-Predicted  Pre % 67  FVC-Post L 3.09  FVC-Predicted Post % 73  Pre FEV1/FVC % % 41  Post FEV1/FCV % % 42  FEV1-Pre L 1.18  FEV1-Predicted Pre % 37  FEV1-Post L 1.29  DLCO uncorrected ml/min/mmHg 11.76  DLCO UNC% % 38  DLVA Predicted % 47  TLC L 7.66  TLC % Predicted % 113  RV % Predicted % 170  PFT 2015: Severe obstructive defect, air trapping present severe diffusion defect.  Echo 11/15/2020: LVEF 55 to 60%.  LV systolic function is normal.  Grade 1 diastolic dysfunction present.  RV systolic function is normal.  RV size is normal.  Mildly elevated PASP at 43 mmHg.  Trivial mitral valve regurgitation.  Mild to moderate aortic valve sclerosis/calcification.  Assessment & Plan:   Centrilobular emphysema ([place]) - Plan: Budeson-Glycopyrrol-Formoterol (BREZTRI AEROSPHERE) 160-9-4.8 MCG/ACT AERO  Pneumonia due to COVID-19 virus  Acute respiratory failure with hypoxia ([place])  Discussion: Blakeley Margraf is a 75 year old male, former smoker with severe COPD/Emphysema and recent hospitalization due to covid 19 pneumonia with acute hypoxemic respiratory failure who returns to pulmonary clinic for follow up.   Patient has done well since hospital admission and is using supplemental oxygen as needed for shortness of breath.  He has been using DuoNeb nebulizer solution as needed as well.  We will start him on Breztri inhaler therapy today, 2 puffs twice daily and he can continue to use as needed albuterol.   I have encouraged him to work on increasing his physical activity with short walks.  We discussed pulmonary rehab and he would like to hold off on that at this time given he is the primary caretaker for his wife who is currently on hospice.  He can continue to use his supplemental oxygen as needed  and we will further evaluate his need for oxygen with exertion at follow-up visit with simple walk-in clinic.  Follow-up in 4 months.  Freda Jackson, MD [facility] Office:  [phone]   Current Outpatient Medications:    acetaminophen (TYLENOL) 325 MG tablet, Take 2 tablets (650 mg total) by mouth every 6 (six) hours as needed for mild pain (or Fever >/= 101)., Disp: , Rfl:    albuterol (VENTOLIN HFA) 108 (90 Base) MCG/ACT inhaler, Inhale 1 puff into the lungs every 4 (four) hours as needed for wheezing or shortness of breath., Disp: 1 each, Rfl: 0   Budeson-Glycopyrrol-Formoterol (BREZTRI AEROSPHERE) 160-9-4.8 MCG/ACT AERO, Inhale 2 puffs into the lungs in the morning and at bedtime., Disp: 10.7 g, Rfl: 6   Budeson-Glycopyrrol-Formoterol (BREZTRI AEROSPHERE) 160-9-4.8 MCG/ACT AERO, Inhale 2 puffs into the lungs in the morning and at bedtime., Disp: 11.8 g, Rfl: 0   calcium carbonate (OS-CAL) 600 MG TABS tablet, Take 600 mg by mouth daily., Disp: , Rfl:    Cholecalciferol (VITAMIN D3) 1000 units CAPS, Take 1 capsule by mouth daily., Disp: , Rfl:    Ferrous Sulfate (IRON) 325 (65 FE) MG TABS, Take 1 tablet by mouth 2 (two) times daily. , Disp: , Rfl:    furosemide (LASIX) 40 MG tablet, Take 1 tablet (40 mg total) by mouth daily., Disp: , Rfl:    ipratropium-albuterol (DUONEB) 0.5-2.5 (3) MG/3ML SOLN, Take 3 mLs by nebulization every 6 (six) hours as needed (shortness of berath or wheezing)., Disp: 360 mL, Rfl: 0   mercaptopurine (PURINETHOL) 50 MG tablet, Take 1.5 tablets (75 mg total) by mouth daily., Disp: 135 tablet, Rfl: 3   Multiple Vitamins-Minerals (MENS MULTIVITAMIN PLUS PO), Take 1 tablet by mouth. Centrum Silver, Disp: , Rfl:    vitamin B-12 (CYANOCOBALAMIN) 1000 MCG tablet, Take 1,000 mcg by mouth daily., Disp: , Rfl:

## 2020-12-14 NOTE — Patient Instructions (Signed)
Start breztri 2 puffs twice daily - rinse mouth out after each use  Use albuterol 1-2 puffs every 4-6 hours as needed for shortness of breath  We will consider referring you to pulmonary rehab in the future  Work on increasing your walking tolerance as tolerated.

## 2020-12-19 DIAGNOSIS — J1282 Pneumonia due to coronavirus disease 2019: Secondary | ICD-10-CM | POA: Diagnosis not present

## 2021-01-10 DIAGNOSIS — Z8679 Personal history of other diseases of the circulatory system: Secondary | ICD-10-CM | POA: Diagnosis not present

## 2021-01-10 DIAGNOSIS — N183 Chronic kidney disease, stage 3 unspecified: Secondary | ICD-10-CM | POA: Diagnosis not present

## 2021-01-10 DIAGNOSIS — Z6822 Body mass index (BMI) 22.0-22.9, adult: Secondary | ICD-10-CM | POA: Diagnosis not present

## 2021-01-10 DIAGNOSIS — K529 Noninfective gastroenteritis and colitis, unspecified: Secondary | ICD-10-CM | POA: Diagnosis not present

## 2021-01-10 DIAGNOSIS — Z0000 Encounter for general adult medical examination without abnormal findings: Secondary | ICD-10-CM | POA: Diagnosis not present

## 2021-01-10 DIAGNOSIS — J449 Chronic obstructive pulmonary disease, unspecified: Secondary | ICD-10-CM | POA: Diagnosis not present

## 2021-01-19 DIAGNOSIS — J1282 Pneumonia due to coronavirus disease 2019: Secondary | ICD-10-CM | POA: Diagnosis not present

## 2021-01-23 ENCOUNTER — Telehealth: Payer: Self-pay | Admitting: Pulmonary Disease

## 2021-01-23 NOTE — Telephone Encounter (Signed)
Called and spoke with pt. Pt wanted to know if there were any OTC meds that he could try to see if that would help with his cough. States he has had a cough since May after being diagnosed with Covid. Provided pt a couple OTC cough meds that he could try and he verbalized understanding. Nothing further needed.

## 2021-02-19 DIAGNOSIS — J1282 Pneumonia due to coronavirus disease 2019: Secondary | ICD-10-CM | POA: Diagnosis not present

## 2021-02-22 ENCOUNTER — Other Ambulatory Visit: Payer: Self-pay | Admitting: Internal Medicine

## 2021-02-22 NOTE — Telephone Encounter (Signed)
I called his home and cell # left detailed message to call and make an appointment, he is over due.

## 2021-03-21 DIAGNOSIS — J1282 Pneumonia due to coronavirus disease 2019: Secondary | ICD-10-CM | POA: Diagnosis not present

## 2021-04-07 DIAGNOSIS — Z23 Encounter for immunization: Secondary | ICD-10-CM | POA: Diagnosis not present

## 2021-04-19 ENCOUNTER — Encounter: Payer: Self-pay | Admitting: Internal Medicine

## 2021-04-19 ENCOUNTER — Ambulatory Visit: Payer: PPO | Admitting: Internal Medicine

## 2021-04-19 ENCOUNTER — Other Ambulatory Visit (INDEPENDENT_AMBULATORY_CARE_PROVIDER_SITE_OTHER): Payer: PPO

## 2021-04-19 ENCOUNTER — Other Ambulatory Visit: Payer: Self-pay | Admitting: Internal Medicine

## 2021-04-19 VITALS — BP 124/60 | HR 56 | Ht 70.0 in | Wt 153.4 lb

## 2021-04-19 DIAGNOSIS — N189 Chronic kidney disease, unspecified: Secondary | ICD-10-CM

## 2021-04-19 DIAGNOSIS — D508 Other iron deficiency anemias: Secondary | ICD-10-CM | POA: Diagnosis not present

## 2021-04-19 DIAGNOSIS — K5100 Ulcerative (chronic) pancolitis without complications: Secondary | ICD-10-CM

## 2021-04-19 DIAGNOSIS — Z7960 Long term (current) use of unspecified immunomodulators and immunosuppressants: Secondary | ICD-10-CM | POA: Diagnosis not present

## 2021-04-19 LAB — COMPREHENSIVE METABOLIC PANEL
ALT: 8 U/L (ref 0–53)
AST: 14 U/L (ref 0–37)
Albumin: 4.4 g/dL (ref 3.5–5.2)
Alkaline Phosphatase: 64 U/L (ref 39–117)
BUN: 42 mg/dL — ABNORMAL HIGH (ref 6–23)
CO2: 28 mEq/L (ref 19–32)
Calcium: 10.7 mg/dL — ABNORMAL HIGH (ref 8.4–10.5)
Chloride: 103 mEq/L (ref 96–112)
Creatinine, Ser: 2.44 mg/dL — ABNORMAL HIGH (ref 0.40–1.50)
GFR: 25.21 mL/min — ABNORMAL LOW (ref >60.00)
Glucose, Bld: 100 mg/dL — ABNORMAL HIGH (ref 70–99)
Potassium: 3.8 mEq/L (ref 3.5–5.1)
Sodium: 141 mEq/L (ref 135–145)
Total Bilirubin: 0.8 mg/dL (ref 0.2–1.2)
Total Protein: 7 g/dL (ref 6.0–8.3)

## 2021-04-19 LAB — CBC WITH DIFFERENTIAL/PLATELET
Basophils Absolute: 0.1 10*3/uL (ref 0.0–0.1)
Basophils Relative: 1.4 % (ref 0.0–3.0)
Eosinophils Absolute: 0.2 10*3/uL (ref 0.0–0.7)
Eosinophils Relative: 3.2 % (ref 0.0–5.0)
HCT: 33.6 % — ABNORMAL LOW (ref 39.0–52.0)
Hemoglobin: 11.3 g/dL — ABNORMAL LOW (ref 13.0–17.0)
Lymphocytes Relative: 19.4 % (ref 12.0–46.0)
Lymphs Abs: 1.2 10*3/uL (ref 0.7–4.0)
MCHC: 33.5 g/dL (ref 30.0–36.0)
MCV: 108.7 fl — ABNORMAL HIGH (ref 78.0–100.0)
Monocytes Absolute: 0.4 10*3/uL (ref 0.1–1.0)
Monocytes Relative: 7.1 % (ref 3.0–12.0)
Neutro Abs: 4.2 10*3/uL (ref 1.4–7.7)
Neutrophils Relative %: 68.9 % (ref 43.0–77.0)
Platelets: 315 10*3/uL (ref 150.0–400.0)
RBC: 3.1 Mil/uL — ABNORMAL LOW (ref 4.22–5.81)
RDW: 20.4 % — ABNORMAL HIGH (ref 11.5–15.5)
WBC: 6.1 10*3/uL (ref 4.0–10.5)

## 2021-04-19 LAB — FERRITIN: Ferritin: 181.7 ng/mL (ref 22.0–322.0)

## 2021-04-19 LAB — VITAMIN D 25 HYDROXY (VIT D DEFICIENCY, FRACTURES): VITD: 47.47 ng/mL (ref 30.00–100.00)

## 2021-04-19 MED ORDER — PEG-KCL-NACL-NASULF-NA ASC-C 100 G PO SOLR: 1.0000 | Freq: Once | ORAL | 0 refills | Status: AC

## 2021-04-19 NOTE — Progress Notes (Signed)
Sean Pope 75 y.o. [birth year] [id number]  Assessment & Plan:   Encounter Diagnoses  Name Primary?   Ulcerative chronic pancolitis without complications (HCC) Yes   Long-term use of immunosuppressant medication    Other iron deficiency anemia    Chronic kidney disease deteriorated     He seems to be doing well overall.  I think it is time to repeat a colonoscopy its been 8 years since diagnosis and about 5 years since he has had a colonoscopy.  We will recheck labs as well.  Continue current therapy.  Orders Placed This Encounter  Procedures   CBC with Differential/Platelet   Comprehensive metabolic panel   Ferritin   Vitamin D (25 hydroxy)   Thiopurine Metabolites   Ambulatory referral to Gastroenterology     Lab Results  Component Value Date   WBC 6.1 04/19/2021   HGB 11.3 (L) 04/19/2021   HCT 33.6 (L) 04/19/2021   MCV 108.7 (H) 04/19/2021   PLT 315.0 04/19/2021   Lab Results  Component Value Date   CREATININE 2.44 (H) 04/19/2021   BUN 42 (H) 04/19/2021   NA 141 04/19/2021   K 3.8 04/19/2021   CL 103 04/19/2021   CO2 28 04/19/2021   Lab Results  Component Value Date   ALT 8 04/19/2021   AST 14 04/19/2021   ALKPHOS 64 04/19/2021   BILITOT 0.8 04/19/2021   His kidney function is significantly worse than earlier in the year.  We will contact him and recommend he follow-up with Dr. Posey Pronto sooner than anticipated.  CC: Tenna Child, MD Dr. Elmarie Shiley  Subjective:   Chief Complaint: Follow-up of ulcerative colitis  HPI Sean Pope returns for follow-up he was seen in April of last year.  He has chronic ulcerative colitis with a history of some severe exacerbations he went into remission on Entyvio and 6-MP then he decided to stop Entyvio a couple of years ago and has been maintained on 6-MP.  He denies any diarrhea or bowel issues.  No rectal bleeding.  Energy level is off.  Unfortunately his wife is in hospice with pulmonary fibrosis and he has  been relatively housebound caring for her.  Hospitalized with COVID and concomitant COPD exacerbation May/June 2022  Colonoscopy June 14, 2016 - Pseudopolyps in the entire examined colon. Resection not attempted. Biopsied. - Ulcerative colitis. Inflammation was found from the anus to the sigmoid colon. This was moderate in severity. Biopsied. - Diverticulosis in the sigmoid colon and in the descending colon. - The examination was otherwise normal.    No Known Allergies Current Meds  Medication Sig   acetaminophen (TYLENOL) 325 MG tablet Take 2 tablets (650 mg total) by mouth every 6 (six) hours as needed for mild pain (or Fever >/= 101).   albuterol (VENTOLIN HFA) 108 (90 Base) MCG/ACT inhaler Inhale 1 puff into the lungs every 4 (four) hours as needed for wheezing or shortness of breath.   Budeson-Glycopyrrol-Formoterol (BREZTRI AEROSPHERE) 160-9-4.8 MCG/ACT AERO Inhale 2 puffs into the lungs in the morning and at bedtime.   Budeson-Glycopyrrol-Formoterol (BREZTRI AEROSPHERE) 160-9-4.8 MCG/ACT AERO Inhale 2 puffs into the lungs in the morning and at bedtime.   calcium carbonate (OS-CAL) 600 MG TABS tablet Take 600 mg by mouth daily.   Cholecalciferol (VITAMIN D3) 1000 units CAPS Take 1 capsule by mouth daily.   Ferrous Sulfate (IRON) 325 (65 FE) MG TABS Take 1 tablet by mouth 2 (two) times daily.    furosemide (LASIX) 40 MG tablet  Take 1 tablet (40 mg total) by mouth daily.   ipratropium-albuterol (DUONEB) 0.5-2.5 (3) MG/3ML SOLN Take 3 mLs by nebulization every 6 (six) hours as needed (shortness of berath or wheezing).   mercaptopurine (PURINETHOL) 50 MG tablet TAKE 1 AND 1/2 TABLETS BY MOUTH DAILY   Multiple Vitamins-Minerals (MENS MULTIVITAMIN PLUS PO) Take 1 tablet by mouth. Centrum Silver   peg 3350 powder (MOVIPREP) 100 g SOLR Take 1 kit (200 g total) by mouth once for 1 dose.   vitamin B-12 (CYANOCOBALAMIN) 1000 MCG tablet Take 1,000 mcg by mouth daily.   Past Medical  History:  Diagnosis Date   Acute respiratory failure with hypoxia ([place]) 09/17/2011   Acute respiratory failure with hypoxia and hypercapnia ([place]) 11/15/2020   AKI (acute kidney injury) ([place])    In the setting of ulcerative colitis flare, question relationship to mesalamine   Anal fissure - posterior 01/22/2014   Anemia-multifactorial 01/05/2014   Blood loss, medications, chronic illness kidney injury   B12 deficiency - borderline 12/21/2014   C. difficile colitis 06/02/2014   Chronic kidney disease, stage III (moderate) ([place]) 09/08/2013   Emphysema lung ([place])    Hyperlipidemia    Hypertension    Latent tuberculosis    Long-term use of immunosuppressant medication-mercaptopurine and Humira 01/05/2014   Lung collapse 09/26/2011   Osteoporosis 06/29/2014   DEXA 06/2014 - lowest t score -4.2   Pneumonia due to COVID-19 virus 11/15/2020   Posterior communicating artery aneurysm 09/17/2011   Protein-calorie malnutrition, severe ([place]) 10/26/2013   Respiratory failure, acute ([place]) 01/07/2014   SAH (subarachnoid hemorrhage) ([place])    Stroke ([place])    Ulcerative colitis ([place]) 05/28/2013   Vitamin D deficiency 06/03/2014   Past Surgical History:  Procedure Laterality Date   ANEURYSM COILING  2013   Posterior communicating   CATARACT EXTRACTION Bilateral aug, sept 2016   COLONOSCOPY W/ BIOPSIES  05/28/2013   IR ANGIO INTRA EXTRACRAN SEL INTERNAL CAROTID BILAT MOD SED  10/22/2017   IR ANGIO VERTEBRAL SEL VERTEBRAL UNI L MOD SED  10/22/2017   Social History   Social History Narrative   Married, 1 son and 1 daughter   Nurse, 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   Review of Systems As per HPI  Objective:   Physical Exam BP 124/60   Pulse (!) 56   Ht 5' 10" (1.778 m)   Wt 153 lb 6.4 oz (69.6 kg)   BMI 22.01 kg/m  Thin NAD Lungs diffusely decreased BS Cor distant S1S2 no rmg Abd thin  soft NT Alert and oriented x 3 Skin - senile purpura

## 2021-04-19 NOTE — Patient Instructions (Signed)
Your provider has requested that you go to the basement level for lab work before leaving today. Press "B" on the elevator. The lab is located at the first door on the left as you exit the elevator.  Due to recent changes in healthcare laws, you may see the results of your imaging and laboratory studies on MyChart before your provider has had a chance to review them.  We understand that in some cases there may be results that are confusing or concerning to you. Not all laboratory results come back in the same time frame and the provider may be waiting for multiple results in order to interpret others.  Please give [place] 48 hours in order for your provider to thoroughly review all the results before contacting the office for clarification of your results.   You have been scheduled for a colonoscopy. Please follow written instructions given to you at your visit today.  Please pick up your prep supplies at the pharmacy within the next 1-3 days. If you use inhalers (even only as needed), please bring them with you on the day of your procedure.  I appreciate the opportunity to care for you. Silvano Rusk, MD, [facility]

## 2021-04-21 DIAGNOSIS — J1282 Pneumonia due to coronavirus disease 2019: Secondary | ICD-10-CM | POA: Diagnosis not present

## 2021-04-24 ENCOUNTER — Other Ambulatory Visit: Payer: Self-pay

## 2021-04-24 ENCOUNTER — Ambulatory Visit
Admission: RE | Admit: 2021-04-24 | Discharge: 2021-04-24 | Disposition: A | Discharge status: Home / Self Care | Payer: PPO | Source: Ambulatory Visit | Attending: Neurosurgery | Admitting: Neurosurgery

## 2021-04-24 DIAGNOSIS — I6031 Nontraumatic subarachnoid hemorrhage from right posterior communicating artery: Secondary | ICD-10-CM

## 2021-04-24 DIAGNOSIS — Z8679 Personal history of other diseases of the circulatory system: Secondary | ICD-10-CM | POA: Diagnosis not present

## 2021-04-24 LAB — THIOPURINE METABOLITES
6 MMP(6-Methylmercaptopurine): 643 pmol/8x10(8)RBC (ref <5700)
6 TG(6-Thioguanine): 154 pmol/8x10(8)RBC — ABNORMAL LOW (ref 235–400)

## 2021-05-01 ENCOUNTER — Encounter (HOSPITAL_COMMUNITY): Payer: Self-pay | Admitting: *Deleted

## 2021-05-03 ENCOUNTER — Other Ambulatory Visit: Payer: Self-pay | Admitting: Internal Medicine

## 2021-05-03 ENCOUNTER — Encounter: Payer: Self-pay | Admitting: Internal Medicine

## 2021-05-03 ENCOUNTER — Telehealth: Payer: Self-pay | Admitting: Internal Medicine

## 2021-05-03 DIAGNOSIS — Z9981 Dependence on supplemental oxygen: Secondary | ICD-10-CM | POA: Insufficient documentation

## 2021-05-03 MED ORDER — PEG-KCL-NACL-NASULF-NA ASC-C 100 G PO SOLR: 1.0000 | Freq: Once | ORAL | 0 refills | Status: DC

## 2021-05-03 NOTE — Telephone Encounter (Signed)
OK Change to MiraLax

## 2021-05-03 NOTE — Telephone Encounter (Signed)
I sent in the generic moviprep and it is going to be $130 per Sean Pope. He would like an alternative please. Advise and I will call him back.

## 2021-05-03 NOTE — Telephone Encounter (Signed)
I called and left him messages on both his numbers that I have redone the instructions for the Miralax prep. I told him to call me back if he cannot see the instructions in his [facility] or if he wants me to email them to him or if he wants to come by and pick them up.

## 2021-05-04 DIAGNOSIS — I6031 Nontraumatic subarachnoid hemorrhage from right posterior communicating artery: Secondary | ICD-10-CM | POA: Diagnosis not present

## 2021-05-04 NOTE — Telephone Encounter (Signed)
-----   Message from Osvaldo Angst, CRNA sent at 05/04/2021  8:18 AM EST ----- Regarding: RE: Donald Prose,  Although this pt is not presently on home O2,  his pulmonologists most  recent dx is severe COPD.  He appears very debilitated by his COPD and uses three inhalors each day.  His procedure will need to be done at the hospital.  Thanks,  Osvaldo Angst   ----- Message ----- From: [place], Kayani [place]aport E, CMA Sent: 05/03/2021   5:06 PM EST To: Osvaldo Angst, CRNA, Gatha Mayer, MD Subject: RE: [facility]                                      I spoke with Mr Fitzgibbon and he said he quit on his own the O2 3 weeks ago. Is he still a no for [facility], please advise , Thank you. ----- Message ----- From: Gatha Mayer, MD Sent: 05/03/2021   2:58 PM EST To: Osvaldo Angst, CRNA, Kaavya Puskarich E [place], CMA Subject: RE: [facility]                                      We missed the O2.  Will add to problem list and reschedule for hospital  PJ will need to be next available which could be Jan or Feb but that is ok    ----- Message ----- From: Osvaldo Angst, CRNA Sent: 05/03/2021  10:03 AM EST To: Gatha Mayer, MD Subject: [facility]pt                                          Dr.  Carlean Purl,  This pt is scheduled with you for a colonoscopy on 05/09/21.  He has severe COPD, uses three inhalors, and has been prescribed supplemental home O2.  His procedure would be best done at the hospital.  Thanks,  Osvaldo Angst

## 2021-05-04 NOTE — Telephone Encounter (Signed)
Patient informed he will have to be a hospital case. I will put him on the hospital list.

## 2021-05-09 ENCOUNTER — Encounter: Payer: PPO | Admitting: Internal Medicine

## 2021-05-21 ENCOUNTER — Other Ambulatory Visit: Payer: Self-pay | Admitting: Internal Medicine

## 2021-05-31 DIAGNOSIS — J449 Chronic obstructive pulmonary disease, unspecified: Secondary | ICD-10-CM | POA: Diagnosis not present

## 2021-05-31 DIAGNOSIS — N183 Chronic kidney disease, stage 3 unspecified: Secondary | ICD-10-CM | POA: Diagnosis not present

## 2021-05-31 DIAGNOSIS — E7800 Pure hypercholesterolemia, unspecified: Secondary | ICD-10-CM | POA: Diagnosis not present

## 2021-05-31 DIAGNOSIS — K219 Gastro-esophageal reflux disease without esophagitis: Secondary | ICD-10-CM | POA: Diagnosis not present

## 2021-06-21 DIAGNOSIS — N183 Chronic kidney disease, stage 3 unspecified: Secondary | ICD-10-CM | POA: Diagnosis not present

## 2021-06-26 DIAGNOSIS — N184 Chronic kidney disease, stage 4 (severe): Secondary | ICD-10-CM | POA: Diagnosis not present

## 2021-06-26 DIAGNOSIS — I129 Hypertensive chronic kidney disease with stage 1 through stage 4 chronic kidney disease, or unspecified chronic kidney disease: Secondary | ICD-10-CM | POA: Diagnosis not present

## 2021-06-26 DIAGNOSIS — N2581 Secondary hyperparathyroidism of renal origin: Secondary | ICD-10-CM | POA: Diagnosis not present

## 2021-07-26 DIAGNOSIS — N184 Chronic kidney disease, stage 4 (severe): Secondary | ICD-10-CM | POA: Diagnosis not present

## 2021-08-02 DIAGNOSIS — I129 Hypertensive chronic kidney disease with stage 1 through stage 4 chronic kidney disease, or unspecified chronic kidney disease: Secondary | ICD-10-CM | POA: Diagnosis not present

## 2021-08-02 DIAGNOSIS — N184 Chronic kidney disease, stage 4 (severe): Secondary | ICD-10-CM | POA: Diagnosis not present

## 2021-08-03 DIAGNOSIS — J1282 Pneumonia due to coronavirus disease 2019: Secondary | ICD-10-CM | POA: Diagnosis not present

## 2021-08-31 DIAGNOSIS — J1282 Pneumonia due to coronavirus disease 2019: Secondary | ICD-10-CM | POA: Diagnosis not present

## 2021-10-01 DIAGNOSIS — J1282 Pneumonia due to coronavirus disease 2019: Secondary | ICD-10-CM | POA: Diagnosis not present

## 2021-10-23 DIAGNOSIS — N184 Chronic kidney disease, stage 4 (severe): Secondary | ICD-10-CM | POA: Diagnosis not present

## 2021-10-31 DIAGNOSIS — J1282 Pneumonia due to coronavirus disease 2019: Secondary | ICD-10-CM | POA: Diagnosis not present

## 2021-11-02 DIAGNOSIS — N184 Chronic kidney disease, stage 4 (severe): Secondary | ICD-10-CM | POA: Diagnosis not present

## 2021-11-02 DIAGNOSIS — I129 Hypertensive chronic kidney disease with stage 1 through stage 4 chronic kidney disease, or unspecified chronic kidney disease: Secondary | ICD-10-CM | POA: Diagnosis not present

## 2021-11-02 DIAGNOSIS — N2581 Secondary hyperparathyroidism of renal origin: Secondary | ICD-10-CM | POA: Diagnosis not present

## 2021-12-01 DIAGNOSIS — J1282 Pneumonia due to coronavirus disease 2019: Secondary | ICD-10-CM | POA: Diagnosis not present

## 2021-12-31 DIAGNOSIS — J1282 Pneumonia due to coronavirus disease 2019: Secondary | ICD-10-CM | POA: Diagnosis not present

## 2022-01-15 DIAGNOSIS — Z1389 Encounter for screening for other disorder: Secondary | ICD-10-CM | POA: Diagnosis not present

## 2022-01-15 DIAGNOSIS — Z0000 Encounter for general adult medical examination without abnormal findings: Secondary | ICD-10-CM | POA: Diagnosis not present

## 2022-03-05 DIAGNOSIS — N184 Chronic kidney disease, stage 4 (severe): Secondary | ICD-10-CM | POA: Diagnosis not present

## 2022-03-12 DIAGNOSIS — N2581 Secondary hyperparathyroidism of renal origin: Secondary | ICD-10-CM | POA: Diagnosis not present

## 2022-03-12 DIAGNOSIS — J449 Chronic obstructive pulmonary disease, unspecified: Secondary | ICD-10-CM | POA: Diagnosis not present

## 2022-03-12 DIAGNOSIS — N1832 Chronic kidney disease, stage 3b: Secondary | ICD-10-CM | POA: Diagnosis not present

## 2022-03-12 DIAGNOSIS — J069 Acute upper respiratory infection, unspecified: Secondary | ICD-10-CM | POA: Diagnosis not present

## 2022-03-12 DIAGNOSIS — I129 Hypertensive chronic kidney disease with stage 1 through stage 4 chronic kidney disease, or unspecified chronic kidney disease: Secondary | ICD-10-CM | POA: Diagnosis not present

## 2022-04-17 DIAGNOSIS — N184 Chronic kidney disease, stage 4 (severe): Secondary | ICD-10-CM | POA: Diagnosis not present

## 2022-04-17 DIAGNOSIS — R093 Abnormal sputum: Secondary | ICD-10-CM | POA: Diagnosis not present

## 2022-04-17 DIAGNOSIS — K529 Noninfective gastroenteritis and colitis, unspecified: Secondary | ICD-10-CM | POA: Diagnosis not present

## 2022-04-17 DIAGNOSIS — Z6823 Body mass index (BMI) 23.0-23.9, adult: Secondary | ICD-10-CM | POA: Diagnosis not present

## 2022-04-17 DIAGNOSIS — Z23 Encounter for immunization: Secondary | ICD-10-CM | POA: Diagnosis not present

## 2022-04-17 DIAGNOSIS — J449 Chronic obstructive pulmonary disease, unspecified: Secondary | ICD-10-CM | POA: Diagnosis not present

## 2022-04-30 ENCOUNTER — Encounter (HOSPITAL_COMMUNITY): Payer: Self-pay | Admitting: *Deleted

## 2022-05-03 DIAGNOSIS — H35363 Drusen (degenerative) of macula, bilateral: Secondary | ICD-10-CM | POA: Diagnosis not present

## 2022-05-04 ENCOUNTER — Ambulatory Visit (INDEPENDENT_AMBULATORY_CARE_PROVIDER_SITE_OTHER): Payer: PPO | Admitting: Family Medicine

## 2022-05-04 ENCOUNTER — Encounter: Payer: Self-pay | Admitting: Family Medicine

## 2022-05-04 VITALS — BP 138/82 | HR 67 | Temp 97.4°F | Ht 70.0 in | Wt 157.8 lb

## 2022-05-04 DIAGNOSIS — E559 Vitamin D deficiency, unspecified: Secondary | ICD-10-CM

## 2022-05-04 DIAGNOSIS — E538 Deficiency of other specified B group vitamins: Secondary | ICD-10-CM | POA: Diagnosis not present

## 2022-05-04 DIAGNOSIS — R5383 Other fatigue: Secondary | ICD-10-CM

## 2022-05-04 DIAGNOSIS — F4321 Adjustment disorder with depressed mood: Secondary | ICD-10-CM

## 2022-05-04 DIAGNOSIS — J432 Centrilobular emphysema: Secondary | ICD-10-CM | POA: Diagnosis not present

## 2022-05-04 DIAGNOSIS — N1831 Chronic kidney disease, stage 3a: Secondary | ICD-10-CM

## 2022-05-04 LAB — CBC WITH DIFFERENTIAL/PLATELET
Basophils Absolute: 0.1 10*3/uL (ref 0.0–0.1)
Basophils Relative: 0.9 % (ref 0.0–3.0)
Eosinophils Absolute: 0.2 10*3/uL (ref 0.0–0.7)
Eosinophils Relative: 2.7 % (ref 0.0–5.0)
HCT: 36.4 % — ABNORMAL LOW (ref 39.0–52.0)
Hemoglobin: 12.3 g/dL — ABNORMAL LOW (ref 13.0–17.0)
Lymphocytes Relative: 12.5 % (ref 12.0–46.0)
Lymphs Abs: 0.9 10*3/uL (ref 0.7–4.0)
MCHC: 33.9 g/dL (ref 30.0–36.0)
MCV: 109.6 fl — ABNORMAL HIGH (ref 78.0–100.0)
Monocytes Absolute: 0.4 10*3/uL (ref 0.1–1.0)
Monocytes Relative: 6.1 % (ref 3.0–12.0)
Neutro Abs: 5.3 10*3/uL (ref 1.4–7.7)
Neutrophils Relative %: 77.8 % — ABNORMAL HIGH (ref 43.0–77.0)
Platelets: 267 10*3/uL (ref 150.0–400.0)
RBC: 3.32 Mil/uL — ABNORMAL LOW (ref 4.22–5.81)
RDW: 18.2 % — ABNORMAL HIGH (ref 11.5–15.5)
WBC: 6.8 10*3/uL (ref 4.0–10.5)

## 2022-05-04 LAB — COMPREHENSIVE METABOLIC PANEL
ALT: 12 U/L (ref 0–53)
AST: 18 U/L (ref 0–37)
Albumin: 4 g/dL (ref 3.5–5.2)
Alkaline Phosphatase: 56 U/L (ref 39–117)
BUN: 34 mg/dL — ABNORMAL HIGH (ref 6–23)
CO2: 35 mEq/L — ABNORMAL HIGH (ref 19–32)
Calcium: 9.2 mg/dL (ref 8.4–10.5)
Chloride: 102 mEq/L (ref 96–112)
Creatinine, Ser: 2.17 mg/dL — ABNORMAL HIGH (ref 0.40–1.50)
GFR: 28.8 mL/min — ABNORMAL LOW (ref >60.00)
Glucose, Bld: 85 mg/dL (ref 70–99)
Potassium: 4.1 mEq/L (ref 3.5–5.1)
Sodium: 143 mEq/L (ref 135–145)
Total Bilirubin: 0.6 mg/dL (ref 0.2–1.2)
Total Protein: 6.2 g/dL (ref 6.0–8.3)

## 2022-05-04 LAB — VITAMIN B12: Vitamin B-12: >1500 pg/mL — ABNORMAL HIGH (ref 211–911)

## 2022-05-04 LAB — VITAMIN D 25 HYDROXY (VIT D DEFICIENCY, FRACTURES): VITD: 76.61 ng/mL (ref 30.00–100.00)

## 2022-05-04 LAB — TSH: TSH: 2.51 u[IU]/mL (ref 0.35–5.50)

## 2022-05-04 MED ORDER — BREZTRI AEROSPHERE 160-9-4.8 MCG/ACT IN AERO: 2.0000 | INHALATION_SPRAY | Freq: Two times a day (BID) | RESPIRATORY_TRACT | 6 refills | Status: DC

## 2022-05-04 NOTE — Progress Notes (Signed)
New Patient Office Visit  Subjective    Patient ID: Sean Pope, male    DOB: 10/11/1945  Age: 76 y.o. MRN: [id number]  CC:  Chief Complaint  Patient presents with   Establish Care    NP/establish care discuss COPD, low energy and SOB. Patient not fasting.     HPI Sean Pope presents to establish care Carries a history of COPD, CKD stage III, ulcerative colitis, multifactorial anemia.  Quit smoking in 2013.  COPD is treated with Judithann Sauger, as needed albuterol and as needed DuoNebs.  Also followed by pulmonology.  Followed by Dr. Posey Pronto with nephrology.  He sees Dr. Arelia Longest for ulcerative colitis and ongoing anemia.  Of special note he lost his wife this past August.  She had been diagnosed with pulmonary fibrosis in 2021 and since that time up into her dad he had been her primary caregiver.  It has been difficult for him to exercise since that time.  He is slowly trying to build his strength again past her death.  Of course he continues to grieve.  He does have a daughter who lives with him.  Outpatient Encounter Medications as of 05/04/2022  Medication Sig   acetaminophen (TYLENOL) 325 MG tablet Take 2 tablets (650 mg total) by mouth every 6 (six) hours as needed for mild pain (or Fever >/= 101).   albuterol (VENTOLIN HFA) 108 (90 Base) MCG/ACT inhaler Inhale 1 puff into the lungs every 4 (four) hours as needed for wheezing or shortness of breath.   calcium carbonate (OS-CAL) 600 MG TABS tablet Take 600 mg by mouth daily.   Cholecalciferol (VITAMIN D3) 1000 units CAPS Take 1 capsule by mouth daily.   Ferrous Sulfate (IRON) 325 (65 FE) MG TABS Take 1 tablet by mouth 2 (two) times daily.    furosemide (LASIX) 40 MG tablet Take 1 tablet (40 mg total) by mouth daily.   ipratropium-albuterol (DUONEB) 0.5-2.5 (3) MG/3ML SOLN Take 3 mLs by nebulization every 6 (six) hours as needed (shortness of berath or wheezing).   mercaptopurine (PURINETHOL) 50 MG tablet TAKE 1 AND 1/2  TABLETS DAILY BY MOUTH   Multiple Vitamins-Minerals (MENS MULTIVITAMIN PLUS PO) Take 1 tablet by mouth. Centrum Silver   vitamin B-12 (CYANOCOBALAMIN) 1000 MCG tablet Take 1,000 mcg by mouth daily.   [DISCONTINUED] Budeson-Glycopyrrol-Formoterol (BREZTRI AEROSPHERE) 160-9-4.8 MCG/ACT AERO Inhale 2 puffs into the lungs in the morning and at bedtime.   Budeson-Glycopyrrol-Formoterol (BREZTRI AEROSPHERE) 160-9-4.8 MCG/ACT AERO Inhale 2 puffs into the lungs in the morning and at bedtime.   [DISCONTINUED] Budeson-Glycopyrrol-Formoterol (BREZTRI AEROSPHERE) 160-9-4.8 MCG/ACT AERO Inhale 2 puffs into the lungs in the morning and at bedtime.   No facility-administered encounter medications on file as of 05/04/2022.    Past Medical History:  Diagnosis Date   Acute respiratory failure with hypoxia ([place]) 09/17/2011   Acute respiratory failure with hypoxia and hypercapnia ([place]) 11/15/2020   AKI (acute kidney injury) ([place])    In the setting of ulcerative colitis flare, question relationship to mesalamine   Anal fissure - posterior 01/22/2014   Anemia-multifactorial 01/05/2014   Blood loss, medications, chronic illness kidney injury   B12 deficiency - borderline 12/21/2014   C. difficile colitis 06/02/2014   Chronic kidney disease, stage III (moderate) ([place]) 09/08/2013   Emphysema lung ([place])    Hyperlipidemia    Hypertension    Latent tuberculosis    Long-term use of immunosuppressant medication-mercaptopurine and Humira 01/05/2014   Lung collapse 09/26/2011   Osteoporosis 06/29/2014  DEXA 06/2014 - lowest t score -4.2   Pneumonia due to COVID-19 virus 11/15/2020   Posterior communicating artery aneurysm 09/17/2011   Protein-calorie malnutrition, severe ([place]) 10/26/2013   Respiratory failure, acute ([place]) 01/07/2014   SAH (subarachnoid hemorrhage) ([place])    Stroke ([place])    Ulcerative colitis ([place]) 05/28/2013   Vitamin D deficiency 06/03/2014    Past Surgical History:  Procedure Laterality Date   ANEURYSM  COILING  2013   Posterior communicating   CATARACT EXTRACTION Bilateral aug, sept 2016   COLONOSCOPY W/ BIOPSIES  05/28/2013   IR ANGIO INTRA EXTRACRAN SEL INTERNAL CAROTID BILAT MOD SED  10/22/2017   IR ANGIO VERTEBRAL SEL VERTEBRAL UNI L MOD SED  10/22/2017    Family History  Problem Relation Age of Onset   Heart disease Mother     Social History   Socioeconomic History   Marital status: Married    Spouse name: Not on file   Number of children: 2   Years of education: Not on file   Highest education level: Not on file  Occupational History   Occupation: Mudlogger    Comment: Advertising copywriter: RETIRED  Tobacco Use   Smoking status: Former    Packs/day: 0.50    Years: 50.00    Total pack years: 25.00    Types: Cigarettes    Quit date: 06/19/2011    Years since quitting: 10.8   Smokeless tobacco: Never  Vaping Use   Vaping Use: Never used  Substance and Sexual Activity   Alcohol use: Yes    Alcohol/week: 0.0 standard drinks of alcohol    Comment: rare   Drug use: No   Sexual activity: Not Currently  Other Topics Concern   Not on file  Social History Narrative   Married, 1 son and 1 daughter   TEFL teacher semiretired working as needed   1.5 caffeine drinks daily   Social Determinants of Radio broadcast assistant Strain: Not on file  Food Insecurity: Not on file  Transportation Needs: Not on file  Physical Activity: Not on file  Stress: Not on file  Social Connections: Not on file  Intimate Partner Violence: Not on file    Review of Systems  Constitutional: Negative.   HENT: Negative.    Eyes:  Negative for blurred vision, discharge and redness.  Respiratory:  Positive for shortness of breath. Negative for cough and sputum production.   Cardiovascular: Negative.   Gastrointestinal:  Negative for abdominal pain.  Genitourinary: Negative.   Musculoskeletal: Negative.  Negative for myalgias.  Skin:  Negative for rash.   Neurological:  Negative for tingling, loss of consciousness and weakness.  Endo/Heme/Allergies:  Negative for polydipsia.        Objective    BP 138/82 (BP Location: Left Arm, Patient Position: Sitting, Cuff Size: Normal)   Pulse 67   Temp (!) 97.4 F (36.3 C) (Temporal)   Ht 5' 10"  (1.778 m)   Wt 157 lb 12.8 oz (71.6 kg)   SpO2 94%   BMI 22.64 kg/m   Physical Exam Constitutional:      General: He is not in acute distress.    Appearance: Normal appearance. He is not ill-appearing, toxic-appearing or diaphoretic.  HENT:     Head: Normocephalic and atraumatic.     Right Ear: External ear normal.     Left Ear: External ear normal.     Mouth/Throat:     Mouth: Mucous membranes are moist.  Pharynx: Oropharynx is clear. No oropharyngeal exudate or posterior oropharyngeal erythema.  Eyes:     General: No scleral icterus.       Right eye: No discharge.        Left eye: No discharge.     Extraocular Movements: Extraocular movements intact.     Conjunctiva/sclera: Conjunctivae normal.     Pupils: Pupils are equal, round, and reactive to light.  Cardiovascular:     Rate and Rhythm: Normal rate and regular rhythm.  Pulmonary:     Effort: Pulmonary effort is normal. No respiratory distress.     Breath sounds: Normal breath sounds. Decreased air movement present.  Abdominal:     General: Bowel sounds are normal.  Musculoskeletal:     Cervical back: No rigidity or tenderness.     Right lower leg: Edema (swelling with trace edema) present.     Left lower leg: Edema (swelling with trace edema) present.  Skin:    General: Skin is warm and dry.  Neurological:     Mental Status: He is alert and oriented to person, place, and time.  Psychiatric:        Mood and Affect: Mood normal.        Behavior: Behavior normal.         Assessment & Plan:   Problem List Items Addressed This Visit       Respiratory   COPD with emphysema  GOLD III   Relevant Medications    Budeson-Glycopyrrol-Formoterol (BREZTRI AEROSPHERE) 160-9-4.8 MCG/ACT AERO     Genitourinary   Chronic kidney disease, stage III (moderate) (HCC)   Relevant Orders   Comprehensive metabolic panel   Urinalysis, Routine w reflex microscopic     Other   Vitamin D deficiency   Relevant Orders   VITAMIN D 25 Hydroxy (Vit-D Deficiency, Fractures)   B12 deficiency - borderline   Relevant Orders   Vitamin B12   Grieving - Primary   Other Visit Diagnoses     Other fatigue       Relevant Orders   CBC with Differential/Platelet   TSH       Return in about 3 months (around 08/04/2022), or if symptoms worsen or fail to improve.  We will obtain his RSV and fall COVID vaccines through the pharmacy.  Follow-up in 3 months check on his grieving process and his progress with returning to physical activity.  Libby Maw, MD

## 2022-05-09 NOTE — Addendum Note (Signed)
Addended by: Beryle Lathe S on: 05/09/2022 03:44 PM   Modules accepted: Orders

## 2022-05-12 ENCOUNTER — Other Ambulatory Visit: Payer: Self-pay | Admitting: Internal Medicine

## 2022-05-28 DIAGNOSIS — L570 Actinic keratosis: Secondary | ICD-10-CM | POA: Diagnosis not present

## 2022-05-28 DIAGNOSIS — L821 Other seborrheic keratosis: Secondary | ICD-10-CM | POA: Diagnosis not present

## 2022-08-06 ENCOUNTER — Ambulatory Visit (INDEPENDENT_AMBULATORY_CARE_PROVIDER_SITE_OTHER): Payer: PPO | Admitting: Family Medicine

## 2022-08-06 ENCOUNTER — Encounter: Payer: Self-pay | Admitting: Family Medicine

## 2022-08-06 VITALS — BP 138/76 | HR 61 | Temp 97.1°F | Ht 70.0 in | Wt 166.0 lb

## 2022-08-06 DIAGNOSIS — N1831 Chronic kidney disease, stage 3a: Secondary | ICD-10-CM

## 2022-08-06 DIAGNOSIS — M7989 Other specified soft tissue disorders: Secondary | ICD-10-CM | POA: Diagnosis not present

## 2022-08-06 DIAGNOSIS — J432 Centrilobular emphysema: Secondary | ICD-10-CM | POA: Diagnosis not present

## 2022-08-06 DIAGNOSIS — R0989 Other specified symptoms and signs involving the circulatory and respiratory systems: Secondary | ICD-10-CM | POA: Insufficient documentation

## 2022-08-06 DIAGNOSIS — D508 Other iron deficiency anemias: Secondary | ICD-10-CM

## 2022-08-06 DIAGNOSIS — F4321 Adjustment disorder with depressed mood: Secondary | ICD-10-CM | POA: Diagnosis not present

## 2022-08-06 MED ORDER — CHLORTHALIDONE 25 MG PO TABS: 12.5000 mg | ORAL_TABLET | Freq: Every day | ORAL | 1 refills | Status: DC

## 2022-08-06 NOTE — Progress Notes (Signed)
Established Patient Office Visit   Subjective:  Patient ID: Sean Pope, male    DOB: [birth year]  Age: 77 y.o. MRN: [id number]  Chief Complaint  Patient presents with   Medical Management of Chronic Issues    3 month follow up, no concerns. Patient not fasting.     HPI Encounter Diagnoses  Name Primary?   Centrilobular emphysema (HCC) Yes   Stage 3a chronic kidney disease (HCC)    Grieving    Swelling of both lower extremities    Chest congestion    Other iron deficiency anemia    For follow-up of above.  Continues to grieve for his late wife.  Things are getting better.  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  Currently followed by pulmonologist.  Lower extremities continue to swell.  Left greater than right.  Ongoing chest congestion.  Difficult to bring it up at times.  Continues with Breztri inhaler and as needed albuterol.  His daughter Theadora Rama is living with him.    Review of Systems  Constitutional: Negative.   HENT: Negative.    Eyes:  Negative for blurred vision, discharge and redness.  Respiratory: Negative.    Cardiovascular: Negative.   Gastrointestinal:  Negative for abdominal pain.  Genitourinary: Negative.   Musculoskeletal: Negative.  Negative for myalgias.  Skin:  Negative for rash.  Neurological:  Negative for tingling, loss of consciousness and weakness.  Endo/Heme/Allergies:  Negative for polydipsia.     Current Outpatient Medications:    acetaminophen (TYLENOL) 325 MG tablet, Take 2 tablets (650 mg total) by mouth every 6 (six) hours as needed for mild pain (or Fever >/= 101)., Disp: , Rfl:    albuterol (VENTOLIN HFA) 108 (90 Base) MCG/ACT inhaler, Inhale 1 puff into the lungs every 4 (four) hours as needed for wheezing or shortness of breath., Disp: 1 each, Rfl: 0   Budeson-Glycopyrrol-Formoterol (BREZTRI AEROSPHERE) 160-9-4.8 MCG/ACT AERO, Inhale 2 puffs into the lungs in the morning and at bedtime., Disp: 10.7 g, Rfl: 6   calcium  carbonate (OS-CAL) 600 MG TABS tablet, Take 600 mg by mouth daily., Disp: , Rfl:    chlorthalidone (HYGROTON) 25 MG tablet, Take 0.5 tablets (12.5 mg total) by mouth daily., Disp: 45 tablet, Rfl: 1   Cholecalciferol (VITAMIN D3) 1000 units CAPS, Take 1 capsule by mouth daily., Disp: , Rfl:    Ferrous Sulfate (IRON) 325 (65 FE) MG TABS, Take 1 tablet by mouth 2 (two) times daily. , Disp: , Rfl:    furosemide (LASIX) 40 MG tablet, Take 1 tablet (40 mg total) by mouth daily., Disp: , Rfl:    mercaptopurine (PURINETHOL) 50 MG tablet, TAKE 1 AND 1/2 TABLETS BY MOUTH DAILY, Disp: 135 tablet, Rfl: 0   Multiple Vitamins-Minerals (MENS MULTIVITAMIN PLUS PO), Take 1 tablet by mouth. Centrum Silver, Disp: , Rfl:    vitamin B-12 (CYANOCOBALAMIN) 1000 MCG tablet, Take 1,000 mcg by mouth daily., Disp: , Rfl:    ipratropium-albuterol (DUONEB) 0.5-2.5 (3) MG/3ML SOLN, Take 3 mLs by nebulization every 6 (six) hours as needed (shortness of berath or wheezing). (Patient not taking: Reported on 08/06/2022), Disp: 360 mL, Rfl: 0   Objective:     BP 138/76 (BP Location: Right Arm, Patient Position: Sitting, Cuff Size: Normal)   Pulse 61   Temp (!) 97.1 F (36.2 C) (Temporal)   Ht 5' 10"$  (1.778 m)   Wt 166 lb (75.3 kg)   SpO2 96%   BMI 23.82 kg/m  BP Readings  from Last 3 Encounters:  08/06/22 138/76  05/04/22 138/82  04/19/21 124/60   Wt Readings from Last 3 Encounters:  08/06/22 166 lb (75.3 kg)  05/04/22 157 lb 12.8 oz (71.6 kg)  04/19/21 153 lb 6.4 oz (69.6 kg)      Physical Exam Constitutional:      General: He is not in acute distress.    Appearance: Normal appearance. He is not ill-appearing, toxic-appearing or diaphoretic.  HENT:     Head: Normocephalic and atraumatic.     Right Ear: External ear normal.     Left Ear: External ear normal.     Mouth/Throat:     Mouth: Mucous membranes are moist.     Pharynx: Oropharynx is clear. No oropharyngeal exudate or posterior oropharyngeal erythema.   Eyes:     General: No scleral icterus.       Right eye: No discharge.        Left eye: No discharge.     Extraocular Movements: Extraocular movements intact.     Conjunctiva/sclera: Conjunctivae normal.     Pupils: Pupils are equal, round, and reactive to light.  Cardiovascular:     Rate and Rhythm: Normal rate and regular rhythm.  Pulmonary:     Effort: Pulmonary effort is normal. No respiratory distress.     Breath sounds: Normal breath sounds. Decreased air movement present.  Abdominal:     General: Bowel sounds are normal.  Musculoskeletal:     Cervical back: No rigidity or tenderness.       Legs:  Skin:    General: Skin is warm and dry.  Neurological:     Mental Status: He is alert and oriented to person, place, and time.  Psychiatric:        Mood and Affect: Mood normal.        Behavior: Behavior normal.      No results found for any visits on 08/06/22.    The ASCVD Risk score (Arnett DK, et al., 2019) failed to calculate for the following reasons:   Cannot find a previous HDL lab   Cannot find a previous total cholesterol lab    Assessment & Plan:   Centrilobular emphysema ([place]) -     Ambulatory referral to Pulmonology  Stage 3a chronic kidney disease ([place]) -     Basic metabolic panel; Future -     CBC; Future  Grieving  Swelling of both lower extremities -     Chlorthalidone; Take 0.5 tablets (12.5 mg total) by mouth daily.  Dispense: 45 tablet; Refill: 1  Chest congestion  Other iron deficiency anemia -     Iron, TIBC and Ferritin Panel; Future    Return in about 3 months (around 11/04/2022), or if symptoms worsen or fail to improve.  Will try 12.5 mg of chlorthalidone daily for lower extremity swelling.  Advised Mucinex with copious hydration for chest congestion.  Agrees to go for pulmonary consultation for COPD.  Libby Maw, MD

## 2022-08-08 ENCOUNTER — Other Ambulatory Visit: Payer: Self-pay | Admitting: Internal Medicine

## 2022-08-09 ENCOUNTER — Encounter: Payer: Self-pay | Admitting: Pulmonary Disease

## 2022-08-09 ENCOUNTER — Ambulatory Visit (INDEPENDENT_AMBULATORY_CARE_PROVIDER_SITE_OTHER): Payer: PPO | Admitting: Pulmonary Disease

## 2022-08-09 VITALS — BP 138/76 | HR 82 | Temp 97.8°F | Ht 70.0 in | Wt 163.2 lb

## 2022-08-09 DIAGNOSIS — K219 Gastro-esophageal reflux disease without esophagitis: Secondary | ICD-10-CM

## 2022-08-09 DIAGNOSIS — J432 Centrilobular emphysema: Secondary | ICD-10-CM | POA: Diagnosis not present

## 2022-08-09 MED ORDER — PREDNISONE 10 MG PO TABS: 40.0000 mg | ORAL_TABLET | Freq: Every day | ORAL | 0 refills | Status: DC

## 2022-08-09 MED ORDER — AZITHROMYCIN 250 MG PO TABS: ORAL_TABLET | ORAL | 0 refills | Status: DC

## 2022-08-09 NOTE — Progress Notes (Signed)
Synopsis: Referred in June 2022 for COPD/Emphysema  Subjective:   PATIENT ID: Sean Pope GENDER: male DOB: 01-16-1946, MRN: [id number]   HPI  Chief Complaint  Patient presents with   Follow-up    Persistent cough with clear mucus production.  SOB with exertion. Rest helps.   Sean Pope is a 77 year old male, former smoker with severe COPD/Emphysema who returns to pulmonary clinic for follow up.   He remains on breztri inhaler 2 puffs twice daily. He complains of coughing up phlegm during the morning time. The mucous is clear to yellow. No sinus congestion or drainage. He does have reflux and is using an OTC medicine.   OV 12/14/20 He reports his breathing is close to being back to baseline.  He denies any cough, sputum production or wheezing.  He is using DuoNeb nebulizer solutions as needed.  He reports significant limitations to his daily activities due to the exertional dyspnea.  He is accompanied by his daughter today and she is asking if he should be on any maintenance inhaler therapy for his COPD/emphysema.  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  He notes a significant decrease in his activity as he has remained around the house to care for his wife.  Past Medical History:  Diagnosis Date   Acute respiratory failure with hypoxia ([place]) 09/17/2011   Acute respiratory failure with hypoxia and hypercapnia ([place]) 11/15/2020   AKI (acute kidney injury) ([place])    In the setting of ulcerative colitis flare, question relationship to mesalamine   Anal fissure - posterior 01/22/2014   Anemia-multifactorial 01/05/2014   Blood loss, medications, chronic illness kidney injury   B12 deficiency - borderline 12/21/2014   C. difficile colitis 06/02/2014   Chronic kidney disease, stage III (moderate) ([place]) 09/08/2013   Emphysema lung ([place])    Hyperlipidemia    Hypertension    Latent  tuberculosis    Long-term use of immunosuppressant medication-mercaptopurine and Humira 01/05/2014   Lung collapse 09/26/2011   Osteoporosis 06/29/2014   DEXA 06/2014 - lowest t score -4.2   Pneumonia due to COVID-19 virus 11/15/2020   Posterior communicating artery aneurysm 09/17/2011   Protein-calorie malnutrition, severe ([place]) 10/26/2013   Respiratory failure, acute ([place]) 01/07/2014   SAH (subarachnoid hemorrhage) ([place])    Stroke ([place])    Ulcerative colitis ([place]) 05/28/2013   Vitamin D deficiency 06/03/2014     Family History  Problem Relation Age of Onset   Heart disease Mother      Social History   Socioeconomic History   Marital status: Married    Spouse name: Not on file   Number of children: 2   Years of education: Not on file   Highest education level: Not on file  Occupational History   Occupation: Mudlogger    Comment: Advertising copywriter: RETIRED  Tobacco Use   Smoking status: Former    Packs/day: 0.50    Years: 50.00    Total pack years: 25.00    Types: Cigarettes    Quit date: 06/19/2011    Years since quitting: 11.1   Smokeless tobacco: Never  Vaping Use   Vaping Use: Never used  Substance and Sexual Activity   Alcohol use: Yes    Alcohol/week: 0.0 standard drinks of alcohol    Comment: rare   Drug use: No   Sexual activity: Not Currently  Other Topics Concern   Not on file  Social History Narrative   Married, 1 son and 1 daughter   TEFL teacher semiretired working as needed   1.5 caffeine drinks daily   Social Determinants of Radio broadcast assistant Strain: Not on file  Food Insecurity: Not on file  Transportation Needs: Not on file  Physical Activity: Not on file  Stress: Not on file  Social Connections: Not on file  Intimate Partner Violence: Not on file     No Known Allergies   Outpatient Medications Prior to Visit  Medication Sig Dispense Refill   acetaminophen (TYLENOL) 325 MG tablet Take 2 tablets (650  mg total) by mouth every 6 (six) hours as needed for mild pain (or Fever >/= 101).     albuterol (VENTOLIN HFA) 108 (90 Base) MCG/ACT inhaler Inhale 1 puff into the lungs every 4 (four) hours as needed for wheezing or shortness of breath. 1 each 0   Budeson-Glycopyrrol-Formoterol (BREZTRI AEROSPHERE) 160-9-4.8 MCG/ACT AERO Inhale 2 puffs into the lungs in the morning and at bedtime. 10.7 g 6   calcium carbonate (OS-CAL) 600 MG TABS tablet Take 600 mg by mouth every other day.     chlorthalidone (HYGROTON) 25 MG tablet Take 0.5 tablets (12.5 mg total) by mouth daily. 45 tablet 1   Cholecalciferol (VITAMIN D3) 1000 units CAPS Take 1 capsule by mouth daily.     Ferrous Sulfate (IRON) 325 (65 FE) MG TABS Take 1 tablet by mouth 2 (two) times daily.      furosemide (LASIX) 40 MG tablet Take 1 tablet (40 mg total) by mouth daily.     mercaptopurine (PURINETHOL) 50 MG tablet TAKE 1 AND 1/2 TABLETS DAILY BY MOUTH 135 tablet 0   Multiple Vitamins-Minerals (MENS MULTIVITAMIN PLUS PO) Take 1 tablet by mouth. Centrum Silver     vitamin B-12 (CYANOCOBALAMIN) 1000 MCG tablet Take 1,000 mcg by mouth daily.     ipratropium-albuterol (DUONEB) 0.5-2.5 (3) MG/3ML SOLN Take 3 mLs by nebulization every 6 (six) hours as needed (shortness of berath or wheezing). (Patient not taking: Reported on 08/06/2022) 360 mL 0   No facility-administered medications prior to visit.    Review of Systems  Constitutional:  Negative for chills, fever, malaise/fatigue and weight loss.  HENT:  Negative for congestion, sinus pain and sore throat.   Eyes: Negative.   Respiratory:  Positive for cough and sputum production. Negative for hemoptysis, shortness of breath and wheezing.   Cardiovascular:  Negative for chest pain, palpitations, orthopnea, claudication and leg swelling.  Gastrointestinal:  Positive for heartburn. Negative for abdominal pain, nausea and vomiting.  Genitourinary: Negative.   Musculoskeletal:  Negative for joint  pain and myalgias.  Skin:  Negative for rash.  Neurological:  Negative for weakness.  Endo/Heme/Allergies: Negative.   Psychiatric/Behavioral: Negative.      Objective:   Vitals:   08/09/22 1523  BP: 138/76  Pulse: 82  Temp: 97.8 F (36.6 C)  TempSrc: Oral  SpO2: 93%  Weight: 163 lb 3.2 oz (74 kg)  Height: 5' 10"$  (1.778 m)    Physical Exam Constitutional:      General: He is not in acute distress.    Appearance: Normal appearance.  HENT:     Head: Normocephalic and atraumatic.  Eyes:     Conjunctiva/sclera: Conjunctivae normal.  Cardiovascular:     Rate and Rhythm: Normal rate and regular rhythm.     Pulses: Normal pulses.  Heart sounds: Normal heart sounds. No murmur heard. Pulmonary:     Effort: Pulmonary effort is normal.     Breath sounds: Decreased breath sounds present. No wheezing, rhonchi or rales.  Musculoskeletal:     Right lower leg: No edema.     Left lower leg: No edema.  Skin:    General: Skin is warm and dry.  Neurological:     General: No focal deficit present.     Mental Status: He is alert.    CBC    Component Value Date/Time   WBC 6.8 05/04/2022 0937   RBC 3.32 (L) 05/04/2022 0937   HGB 12.3 (L) 05/04/2022 0937   HCT 36.4 (L) 05/04/2022 0937   PLT 267.0 05/04/2022 0937   MCV 109.6 (H) 05/04/2022 0937   MCH 35.3 (H) 11/17/2020 0241   MCHC 33.9 05/04/2022 0937   RDW 18.2 (H) 05/04/2022 0937   LYMPHSABS 0.9 05/04/2022 0937   MONOABS 0.4 05/04/2022 0937   EOSABS 0.2 05/04/2022 0937   BASOSABS 0.1 05/04/2022 0937      Latest Ref Rng & Units 05/04/2022    9:38 AM 04/19/2021   10:03 AM 11/19/2020    2:25 AM  BMP  Glucose 70 - 99 mg/dL 85  100  126   BUN 6 - 23 mg/dL 34  42  68   Creatinine 0.40 - 1.50 mg/dL 2.17  2.44  1.69   Sodium 135 - 145 mEq/L 143  141  137   Potassium 3.5 - 5.1 mEq/L 4.1  3.8  3.7   Chloride 96 - 112 mEq/L 102  103  101   CO2 19 - 32 mEq/L 35  28  30   Calcium 8.4 - 10.5 mg/dL 9.2  10.7  7.9    Chest  imaging: CXR 11/15/20 COPD with pulmonary hyperinflation and emphysema. Mild scarring in the left lung base.  PFT:    Latest Ref Rng & Units 04/23/2014   10:33 AM  PFT Results  FVC-Pre L 2.86   FVC-Predicted Pre % 67   FVC-Post L 3.09   FVC-Predicted Post % 73   Pre FEV1/FVC % % 41   Post FEV1/FCV % % 42   FEV1-Pre L 1.18   FEV1-Predicted Pre % 37   FEV1-Post L 1.29   DLCO uncorrected ml/min/mmHg 11.76   DLCO UNC% % 38   DLVA Predicted % 47   TLC L 7.66   TLC % Predicted % 113   RV % Predicted % 170   PFT 2015: Severe obstructive defect, air trapping present severe diffusion defect.  Echo 11/15/2020: LVEF 55 to 60%.  LV systolic function is normal.  Grade 1 diastolic dysfunction present.  RV systolic function is normal.  RV size is normal.  Mildly elevated PASP at 43 mmHg.  Trivial mitral valve regurgitation.  Mild to moderate aortic valve sclerosis/calcification.  Assessment & Plan:   Centrilobular emphysema ([place] [facility] [place]) - Plan: predniSONE (DELTASONE) 10 MG tablet, azithromycin (ZITHROMAX) 250 MG tablet  Gastroesophageal reflux disease without esophagitis  Discussion: Sean Pope is a 77 year old male, former smoker with severe COPD/Emphysema who returns to pulmonary clinic for follow up.   He is to continue breztri 2 puffs twice daily and as needed albuterol.   He is to let us know which OTC antacid medication he is using. Recommend he use a wedge pillow at night to reduce night time reflux which could be leading to his increase in cough and mucous production.   Recommend using  duonebs 2-3 times per day followed by flutter valve therapy to aid in mucous clearance.   He is to take prednisone 19m daily for 5 days and Zpak for 5 days to help reduce his cough and mucous production.  Follow-up in 4 months.  JFreda Jackson MD [facility] Office: [phone]   Current Outpatient Medications:    acetaminophen (TYLENOL) 325 MG tablet, Take 2 tablets  (650 mg total) by mouth every 6 (six) hours as needed for mild pain (or Fever >/= 101)., Disp: , Rfl:    albuterol (VENTOLIN HFA) 108 (90 Base) MCG/ACT inhaler, Inhale 1 puff into the lungs every 4 (four) hours as needed for wheezing or shortness of breath., Disp: 1 each, Rfl: 0   azithromycin (ZITHROMAX) 250 MG tablet, Take as directed, Disp: 6 tablet, Rfl: 0   Budeson-Glycopyrrol-Formoterol (BREZTRI AEROSPHERE) 160-9-4.8 MCG/ACT AERO, Inhale 2 puffs into the lungs in the morning and at bedtime., Disp: 10.7 g, Rfl: 6   calcium carbonate (OS-CAL) 600 MG TABS tablet, Take 600 mg by mouth every other day., Disp: , Rfl:    chlorthalidone (HYGROTON) 25 MG tablet, Take 0.5 tablets (12.5 mg total) by mouth daily., Disp: 45 tablet, Rfl: 1   Cholecalciferol (VITAMIN D3) 1000 units CAPS, Take 1 capsule by mouth daily., Disp: , Rfl:    Ferrous Sulfate (IRON) 325 (65 FE) MG TABS, Take 1 tablet by mouth 2 (two) times daily. , Disp: , Rfl:    furosemide (LASIX) 40 MG tablet, Take 1 tablet (40 mg total) by mouth daily., Disp: , Rfl:    mercaptopurine (PURINETHOL) 50 MG tablet, TAKE 1 AND 1/2 TABLETS DAILY BY MOUTH, Disp: 135 tablet, Rfl: 0   Multiple Vitamins-Minerals (MENS MULTIVITAMIN PLUS PO), Take 1 tablet by mouth. Centrum Silver, Disp: , Rfl:    predniSONE (DELTASONE) 10 MG tablet, Take 4 tablets (40 mg total) by mouth daily with breakfast., Disp: 20 tablet, Rfl: 0   vitamin B-12 (CYANOCOBALAMIN) 1000 MCG tablet, Take 1,000 mcg by mouth daily., Disp: , Rfl:    ipratropium-albuterol (DUONEB) 0.5-2.5 (3) MG/3ML SOLN, Take 3 mLs by nebulization every 6 (six) hours as needed (shortness of berath or wheezing). (Patient not taking: Reported on 08/06/2022), Disp: 360 mL, Rfl: 0

## 2022-08-09 NOTE — Patient Instructions (Addendum)
Start prednisone 2m daily for 5 days  Start Zpak daily for 5 days  Continue to use breztri 2 puffs twice daily - rinse mouth out after each use  Use duoneb nebulizer treatment 2-3 times per day follow by flutter valve for mucous clearance.   Please message [email or web address] the reflux medicine you are taking over the counter.  Try using the wedge willow to reduce night time reflux issues  Follow up in 6 months

## 2022-08-10 ENCOUNTER — Telehealth: Payer: Self-pay | Admitting: Pulmonary Disease

## 2022-08-10 NOTE — Telephone Encounter (Signed)
Pt. Calling to give medication info to the DR. Lansoprazole 15 mg 1 per day he said he saw Dr. Erin Fulling yesterday and would call back and let him know and to add to med list

## 2022-08-10 NOTE — Telephone Encounter (Signed)
Dr. Erin Fulling just an FYI please see previous encounter. I have added this medication to patients list

## 2022-08-13 ENCOUNTER — Telehealth: Payer: Self-pay | Admitting: Family Medicine

## 2022-08-13 ENCOUNTER — Telehealth: Payer: Self-pay | Admitting: Internal Medicine

## 2022-08-13 NOTE — Telephone Encounter (Signed)
Please ask [name] to schedule a follow-up appointment with me - not urgent

## 2022-08-13 NOTE — Telephone Encounter (Signed)
Contacted Sean Pope to schedule their annual wellness visit. Appointment made for 08/20/22.  Sean Pope AWV direct phone # [phone]

## 2022-08-13 NOTE — Telephone Encounter (Signed)
Appointment made for April 12th at 10:30AM. Patient aware.

## 2022-08-20 ENCOUNTER — Ambulatory Visit (INDEPENDENT_AMBULATORY_CARE_PROVIDER_SITE_OTHER): Payer: PPO

## 2022-08-20 VITALS — Ht 70.0 in | Wt 160.0 lb

## 2022-08-20 DIAGNOSIS — Z0000 Encounter for general adult medical examination without abnormal findings: Secondary | ICD-10-CM

## 2022-08-20 NOTE — Progress Notes (Signed)
I connected with  [name] on 08/20/22 by a audio enabled telemedicine application and verified that I am speaking with the correct person using two identifiers.  Patient Location: Home  Provider Location: Office/Clinic  I discussed the limitations of evaluation and management by telemedicine. The patient expressed understanding and agreed to proceed.  Subjective:   Sean Pope is a 77 y.o. male who presents for an Initial Medicare Annual Wellness Visit.  Review of Systems     Cardiac Risk Factors include: advanced age (>27mn, >>46women);male gender     Objective:    Today's Vitals   08/20/22 1111  Weight: 160 lb (72.6 kg)  Height: '5\' 10"'$  (1.778 m)   Body mass index is 22.96 kg/m.     08/20/2022   11:16 AM 11/15/2020    9:00 PM 01/07/2014    4:00 PM 10/26/2013    3:39 PM 09/05/2013    9:00 PM 01/24/2012    9:07 AM 09/17/2011    6:22 PM  Advanced Directives  Does Patient Have a Medical Advance Directive? Yes No Patient does not have advance directive;Patient would not like information Patient does not have advance directive;Patient would not like information Patient does not have advance directive Patient does not have advance directive Patient does not have advance directive  Type of Advance Directive H[place] will        Copy of [place] Chart? No - copy requested        Would patient like information on creating a medical advance directive?  No - Patient declined       Pre-existing out of facility DNR order (yellow form or pink MOST form)   No No No      Current Medications (verified) Outpatient Encounter Medications as of 08/20/2022  Medication Sig   acetaminophen (TYLENOL) 325 MG tablet Take 2 tablets (650 mg total) by mouth every 6 (six) hours as needed for mild pain (or Fever >/= 101).   albuterol (VENTOLIN HFA) 108 (90 Base) MCG/ACT inhaler Inhale 1 puff into the lungs every 4 (four) hours as needed  for wheezing or shortness of breath.   Budeson-Glycopyrrol-Formoterol (BREZTRI AEROSPHERE) 160-9-4.8 MCG/ACT AERO Inhale 2 puffs into the lungs in the morning and at bedtime.   calcium carbonate (OS-CAL) 600 MG TABS tablet Take 600 mg by mouth every other day.   chlorthalidone (HYGROTON) 25 MG tablet Take 0.5 tablets (12.5 mg total) by mouth daily.   Cholecalciferol (VITAMIN D3) 1000 units CAPS Take 1 capsule by mouth daily.   Ferrous Sulfate (IRON) 325 (65 FE) MG TABS Take 1 tablet by mouth 2 (two) times daily.    furosemide (LASIX) 40 MG tablet Take 1 tablet (40 mg total) by mouth daily.   ipratropium-albuterol (DUONEB) 0.5-2.5 (3) MG/3ML SOLN Take 3 mLs by nebulization every 6 (six) hours as needed (shortness of [place] or wheezing).   lansoprazole (PREVACID) 15 MG capsule Take 15 mg by mouth 1 day or 1 dose.   mercaptopurine (PURINETHOL) 50 MG tablet TAKE 1 AND 1/2 TABLETS DAILY BY MOUTH   Multiple Vitamins-Minerals (MENS MULTIVITAMIN PLUS PO) Take 1 tablet by mouth. Centrum Silver   vitamin B-12 (CYANOCOBALAMIN) 1000 MCG tablet Take 1,000 mcg by mouth daily.   azithromycin (ZITHROMAX) 250 MG tablet Take as directed (Patient not taking: Reported on 08/20/2022)   predniSONE (DELTASONE) 10 MG tablet Take 4 tablets (40 mg total) by mouth daily with breakfast. (Patient not taking: Reported on 08/20/2022)  No facility-administered encounter medications on file as of 08/20/2022.    Allergies (verified) Patient has no known allergies.   History: Past Medical History:  Diagnosis Date   Acute respiratory failure with hypoxia ([place]) 09/17/2011   Acute respiratory failure with hypoxia and hypercapnia ([place]) 11/15/2020   AKI (acute kidney injury) ([place])    In the setting of ulcerative colitis flare, question relationship to mesalamine   Anal fissure - posterior 01/22/2014   Anemia-multifactorial 01/05/2014   Blood loss, medications, chronic illness kidney injury   B12 deficiency - borderline 12/21/2014   C.  difficile colitis 06/02/2014   Chronic kidney disease, stage III (moderate) ([place]) 09/08/2013   Emphysema lung ([place])    Hyperlipidemia    Hypertension    Latent tuberculosis    Long-term use of immunosuppressant medication-mercaptopurine and Humira 01/05/2014   Lung collapse 09/26/2011   Osteoporosis 06/29/2014   DEXA 06/2014 - lowest t score -4.2   Pneumonia due to COVID-19 virus 11/15/2020   Posterior communicating artery aneurysm 09/17/2011   Protein-calorie malnutrition, severe ([place]) 10/26/2013   Respiratory failure, acute ([place]) 01/07/2014   SAH (subarachnoid hemorrhage) ([place])    Stroke ([place])    Ulcerative colitis ([place]) 05/28/2013   Vitamin D deficiency 06/03/2014   Past Surgical History:  Procedure Laterality Date   ANEURYSM COILING  2013   Posterior communicating   CATARACT EXTRACTION Bilateral aug, sept 2016   COLONOSCOPY W/ BIOPSIES  05/28/2013   IR ANGIO INTRA EXTRACRAN SEL INTERNAL CAROTID BILAT MOD SED  10/22/2017   IR ANGIO VERTEBRAL SEL VERTEBRAL UNI L MOD SED  10/22/2017   Family History  Problem Relation Age of Onset   Heart disease Mother    Social History   Socioeconomic History   Marital status: Widowed    Spouse name: Not on file   Number of children: 2   Years of education: Not on file   Highest education level: Not on file  Occupational History   Occupation: Director    Comment: [employer]    Employer: RETIRED  Tobacco Use   Smoking status: Former    Packs/day: 0.50    Years: 50.00    Total pack years: 25.00    Types: Cigarettes    Quit date: 06/19/2011    Years since quitting: 11.1   Smokeless tobacco: Never  Vaping Use   Vaping Use: Never used  Substance and Sexual Activity   Alcohol use: Yes    Alcohol/week: 0.0 standard drinks of alcohol    Comment: rare   Drug use: No   Sexual activity: Not Currently  Other Topics Concern   Not on file  Social History Narrative   Married, 1 son and 1 daughter   Nurse, 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   1.5 caffeine drinks daily   Social Determinants of Health   Financial Resource Strain: Low Risk  (08/20/2022)   Overall Financial Resource Strain (CARDIA)    Difficulty of Paying Living Expenses: Not hard at all  Food Insecurity: No Food Insecurity (08/20/2022)   Hunger Vital Sign    Worried About Running Out of Food in the Last Year: Never true    [place] in the Last Year: Never true  Transportation Needs: No Transportation Needs (08/20/2022)   PRAPARE - Hydrologist (Medical): No    Lack of Transportation (Non-Medical): No  Physical Activity: Inactive (08/20/2022)   Exercise Vital Sign    Days of Exercise per  Week: 0 days    Minutes of Exercise per Session: 0 min  Stress: No Stress Concern Present (08/20/2022)   [place]    Feeling of Stress : Not at all  Social Connections: Not on file    Tobacco Counseling Counseling given: Not Answered   Clinical Intake:  Pre-visit preparation completed: Yes  Pain : No/denies pain     Nutritional Status: BMI of 19-24  Normal Nutritional Risks: Nausea/ vomitting/ diarrhea (nausea due to medications he has been taking, completed regimen) Diabetes: No  How often do you need to have someone help you when you read instructions, pamphlets, or other written materials from your doctor or pharmacy?: 1 - Never  Diabetic? no  Interpreter Needed?: No  Information entered by :: NAllen LPN   Activities of Daily Living    08/20/2022   11:17 AM 08/19/2022    6:43 PM  In your present state of health, do you have any difficulty performing the following activities:  Hearing? 0 0  Vision? 0 0  Difficulty concentrating or making decisions? 0 0  Walking or climbing stairs? 1 1  Comment due to breathing   Dressing or bathing? 0 0  Doing errands, shopping? 0 0  Preparing Food and eating ? N N  Using the Toilet? N N   In the past six months, have you accidently leaked urine? N N  Do you have problems with loss of bowel control? N N  Managing your Medications? N N  Managing your Finances? N N  Housekeeping or managing your Housekeeping? N N    Patient Care Team: Libby Maw, MD as PCP - General (Family Medicine)  Indicate any recent Medical Services you may have received from other than [facility] providers in the past year (date may be approximate).     Assessment:   This is a routine wellness examination for Sean Pope.  Hearing/Vision screen Vision Screening - Comments:: Regular eye exams, [place]  Dietary issues and exercise activities discussed: Current Exercise Habits: The patient does not participate in regular exercise at present   Goals Addressed             This Visit's Progress    Patient Stated       08/20/2022, wants to start exercising       Depression Screen    08/20/2022   11:17 AM 08/06/2022    9:02 AM 05/04/2022    9:49 AM 05/04/2022    8:41 AM 10/13/2015    9:26 AM 07/12/2015    1:48 PM  PHQ 2/9 Scores  PHQ - 2 Score 0 0 2 0 0 0  PHQ- 9 Score   4       Fall Risk    08/20/2022   11:17 AM 08/19/2022    6:43 PM 08/06/2022    9:02 AM 05/04/2022    8:41 AM 10/13/2015    9:29 AM  Fall Risk   Falls in the past year? 0 0 0 0 No  Number falls in past yr: 0 0 0 0   Injury with Fall? 0 0 0    Risk for fall due to : Medication side effect  No Fall Risks    Follow up Falls prevention discussed;Education provided;Falls evaluation completed  Falls evaluation completed      FALL RISK PREVENTION PERTAINING TO THE HOME:  Any stairs in or around the home? Yes  If so, are there any without handrails?  No  Home free of loose throw rugs in walkways, pet beds, electrical cords, etc? Yes  Adequate lighting in your home to reduce risk of falls? Yes   ASSISTIVE DEVICES UTILIZED TO PREVENT FALLS:  Life alert? No  Use of a cane, walker or w/c? No  Grab bars in the  bathroom? Yes  Shower chair or bench in shower? Yes  Elevated toilet seat or a handicapped toilet? Yes   TIMED UP AND GO:  Was the test performed? No .      Cognitive Function:        08/20/2022   11:18 AM  6CIT Screen  What Year? 0 points  What month? 0 points  What time? 0 points  Count back from 20 0 points  Months in reverse 0 points  Repeat phrase 0 points  Total Score 0 points    Immunizations Immunization History  Administered Date(s) Administered   Hep A / Hep B 11/30/2013, 12/07/2013, 12/21/2013, 12/16/2014   Influenza Split 04/12/2014   Influenza Whole 04/23/2022   Influenza, High Dose Seasonal PF 04/07/2021   Influenza,inj,Quad PF,6+ Mos 04/06/2016   Influenza-Unspecified 04/07/2015   PFIZER(Purple Top)SARS-COV-2 Vaccination 09/09/2019, 09/23/2019, 04/18/2020   PPD Test 11/30/2013, 06/21/2015   Pneumococcal Conjugate-13 04/23/2014   Pneumococcal Polysaccharide-23 09/17/2011, 04/23/2022    TDAP status: Due, Education has been provided regarding the importance of this vaccine. Advised may receive this vaccine at local pharmacy or Health Dept. Aware to provide a copy of the vaccination record if obtained from local pharmacy or Health Dept. Verbalized acceptance and understanding.  Flu Vaccine status: Up to date  Pneumococcal vaccine status: Up to date  Covid-19 vaccine status: Completed vaccines  Qualifies for Shingles Vaccine? Yes   Zostavax completed No   Shingrix Completed?: No.    Education has been provided regarding the importance of this vaccine. Patient has been advised to call insurance company to determine out of pocket expense if they have not yet received this vaccine. Advised may also receive vaccine at local pharmacy or Health Dept. Verbalized acceptance and understanding.  Screening Tests Health Maintenance  Topic Date Due   Hepatitis C Screening  Never done   Lung Cancer Screening  01/09/2015   COVID-19 Vaccine (4 - 2023-24 season)  05/03/2023 (Originally 02/16/2022)   Zoster Vaccines- Shingrix (1 of 2) 08/05/2023 (Originally 07/28/1964)   COLONOSCOPY (Pts 45-46yr Insurance coverage will need to be confirmed)  06/15/2023   Pneumonia Vaccine 77 Years old  Completed   INFLUENZA VACCINE  Completed   HPV VACCINES  Aged Out   DTaP/Tdap/Td  Discontinued    Health Maintenance  Health Maintenance Due  Topic Date Due   Hepatitis C Screening  Never done   Lung Cancer Screening  01/09/2015    Colorectal cancer screening: Type of screening: Colonoscopy. Completed 06/14/2016. Repeat every 7 years  Lung Cancer Screening: (Low Dose CT Chest recommended if Age 77-80years, 30 pack-year currently smoking OR have quit w/in 15years.) does not qualify.   Lung Cancer Screening Referral: no  Additional Screening:  Hepatitis C Screening: does qualify;   Vision Screening: Recommended annual ophthalmology exams for early detection of glaucoma and other disorders of the eye. Is the patient up to date with their annual eye exam?  Yes  Who is the provider or what is the name of the office in which the patient attends annual eye exams? [email or web address] is not established with a provider, would they like to be referred to a  provider to establish care? No .   Dental Screening: Recommended annual dental exams for proper oral hygiene  Community Resource Referral / Chronic Care Management: CRR required this visit?  No   CCM required this visit?  No      Plan:     I have personally reviewed and noted the following in the patient's chart:   Medical and social history Use of alcohol, tobacco or illicit drugs  Current medications and supplements including opioid prescriptions. Patient is not currently taking opioid prescriptions. Functional ability and status Nutritional status Physical activity Advanced directives List of other physicians Hospitalizations, surgeries, and ER visits in previous 12 months Vitals Screenings  to include cognitive, depression, and falls Referrals and appointments  In addition, I have reviewed and discussed with patient certain preventive protocols, quality metrics, and best practice recommendations. A written personalized care plan for preventive services as well as general preventive health recommendations were provided to patient.     Kellie Simmering, LPN   [id number]   Nurse Notes: none  Due to this being a virtual visit, the after visit summary with patients personalized plan was offered to patient via mail or my-chart. Patient would like to access on my-chart

## 2022-08-20 NOTE — Patient Instructions (Signed)
Mr. Sean Pope , Thank you for taking time to come for your Medicare Wellness Visit. I appreciate your ongoing commitment to your health goals. Please review the following plan we discussed and let me know if I can assist you in the future.   These are the goals we discussed:  Goals      Patient Stated     08/20/2022, wants to start exercising        This is a list of the screening recommended for you and due dates:  Health Maintenance  Topic Date Due   Hepatitis C Screening: USPSTF Recommendation to screen - Ages 71-79 yo.  Never done   Screening for Lung Cancer  01/09/2015   COVID-19 Vaccine (4 - 2023-24 season) 05/03/2023*   Zoster (Shingles) Vaccine (1 of 2) 08/05/2023*   Colon Cancer Screening  06/15/2023   Pneumonia Vaccine  Completed   Flu Shot  Completed   HPV Vaccine  Aged Out   DTaP/Tdap/Td vaccine  Discontinued  *Topic was postponed. The date shown is not the original due date.    Advanced directives: Please bring a copy of your POA (Power of Attorney) and/or Living Will to your next appointment.   Conditions/risks identified: none  Next appointment: Follow up in one year for your annual wellness visit.   Preventive Care 40 Years and Older, Male  Preventive care refers to lifestyle choices and visits with your health care provider that can promote health and wellness. What does preventive care include? A yearly physical exam. This is also called an annual well check. Dental exams once or twice a year. Routine eye exams. Ask your health care provider how often you should have your eyes checked. Personal lifestyle choices, including: Daily care of your teeth and gums. Regular physical activity. Eating a healthy diet. Avoiding tobacco and drug use. Limiting alcohol use. Practicing safe sex. Taking low doses of aspirin every day. Taking vitamin and mineral supplements as recommended by your health care provider. What happens during an annual well check? The services  and screenings done by your health care provider during your annual well check will depend on your age, overall health, lifestyle risk factors, and family history of disease. Counseling  Your health care provider may ask you questions about your: Alcohol use. Tobacco use. Drug use. Emotional well-being. Home and relationship well-being. Sexual activity. Eating habits. History of falls. Memory and ability to understand (cognition). Work and work Statistician. Screening  You may have the following tests or measurements: Height, weight, and BMI. Blood pressure. Lipid and cholesterol levels. These may be checked every 5 years, or more frequently if you are over 10 years old. Skin check. Lung cancer screening. You may have this screening every year starting at age 61 if you have a 30-pack-year history of smoking and currently smoke or have quit within the past 15 years. Fecal occult blood test (FOBT) of the stool. You may have this test every year starting at age 62. Flexible sigmoidoscopy or colonoscopy. You may have a sigmoidoscopy every 5 years or a colonoscopy every 10 years starting at age 51. Prostate cancer screening. Recommendations will vary depending on your family history and other risks. Hepatitis C blood test. Hepatitis B blood test. Sexually transmitted disease (STD) testing. Diabetes screening. This is done by checking your blood sugar (glucose) after you have not eaten for a while (fasting). You may have this done every 1-3 years. Abdominal aortic aneurysm (AAA) screening. You may need this if you are a  current or former smoker. Osteoporosis. You may be screened starting at age 24 if you are at high risk. Talk with your health care provider about your test results, treatment options, and if necessary, the need for more tests. Vaccines  Your health care provider may recommend certain vaccines, such as: Influenza vaccine. This is recommended every year. Tetanus, diphtheria,  and acellular pertussis (Tdap, Td) vaccine. You may need a Td booster every 10 years. Zoster vaccine. You may need this after age 58. Pneumococcal 13-valent conjugate (PCV13) vaccine. One dose is recommended after age 1. Pneumococcal polysaccharide (PPSV23) vaccine. One dose is recommended after age 45. Talk to your health care provider about which screenings and vaccines you need and how often you need them. This information is not intended to replace advice given to you by your health care provider. Make sure you discuss any questions you have with your health care provider. Document Released: 07/01/2015 Document Revised: 02/22/2016 Document Reviewed: 04/05/2015 Elsevier Interactive Patient Education  2017 [place] Prevention in the Home Falls can cause injuries. They can happen to people of all ages. There are many things you can do to make your home safe and to help prevent falls. What can I do on the outside of my home? Regularly fix the edges of walkways and driveways and fix any cracks. Remove anything that might make you trip as you walk through a door, such as a raised step or threshold. Trim any bushes or trees on the path to your home. Use bright outdoor lighting. Clear any walking paths of anything that might make someone trip, such as rocks or tools. Regularly check to see if handrails are loose or broken. Make sure that both sides of any steps have handrails. Any raised decks and porches should have guardrails on the edges. Have any leaves, snow, or ice cleared regularly. Use sand or salt on walking paths during winter. Clean up any spills in your garage right away. This includes oil or grease spills. What can I do in the bathroom? Use night lights. Install grab bars by the toilet and in the tub and shower. Do not use towel bars as grab bars. Use non-skid mats or decals in the tub or shower. If you need to sit down in the shower, use a plastic, non-slip  stool. Keep the floor dry. Clean up any water that spills on the floor as soon as it happens. Remove soap buildup in the tub or shower regularly. Attach bath mats securely with double-sided non-slip rug tape. Do not have throw rugs and other things on the floor that can make you trip. What can I do in the bedroom? Use night lights. Make sure that you have a light by your bed that is easy to reach. Do not use any sheets or blankets that are too big for your bed. They should not hang down onto the floor. Have a firm chair that has side arms. You can use this for support while you get dressed. Do not have throw rugs and other things on the floor that can make you trip. What can I do in the kitchen? Clean up any spills right away. Avoid walking on wet floors. Keep items that you use a lot in easy-to-reach places. If you need to reach something above you, use a strong step stool that has a grab bar. Keep electrical cords out of the way. Do not use floor polish or wax that makes floors slippery. If you must use  wax, use non-skid floor wax. Do not have throw rugs and other things on the floor that can make you trip. What can I do with my stairs? Do not leave any items on the stairs. Make sure that there are handrails on both sides of the stairs and use them. Fix handrails that are broken or loose. Make sure that handrails are as long as the stairways. Check any carpeting to make sure that it is firmly attached to the stairs. Fix any carpet that is loose or worn. Avoid having throw rugs at the top or bottom of the stairs. If you do have throw rugs, attach them to the floor with carpet tape. Make sure that you have a light switch at the top of the stairs and the bottom of the stairs. If you do not have them, ask someone to add them for you. What else can I do to help prevent falls? Wear shoes that: Do not have high heels. Have rubber bottoms. Are comfortable and fit you well. Are closed at the  toe. Do not wear sandals. If you use a stepladder: Make sure that it is fully opened. Do not climb a closed stepladder. Make sure that both sides of the stepladder are locked into place. Ask someone to hold it for you, if possible. Clearly mark and make sure that you can see: Any grab bars or handrails. First and last steps. Where the edge of each step is. Use tools that help you move around (mobility aids) if they are needed. These include: Canes. Walkers. Scooters. Crutches. Turn on the lights when you go into a dark area. Replace any light bulbs as soon as they burn out. Set up your furniture so you have a clear path. Avoid moving your furniture around. If any of your floors are uneven, fix them. If there are any pets around you, be aware of where they are. Review your medicines with your doctor. Some medicines can make you feel dizzy. This can increase your chance of falling. Ask your doctor what other things that you can do to help prevent falls. This information is not intended to replace advice given to you by your health care provider. Make sure you discuss any questions you have with your health care provider. Document Released: 03/31/2009 Document Revised: 11/10/2015 Document Reviewed: 07/09/2014 Elsevier Interactive Patient Education  2017 [employer].

## 2022-09-04 DIAGNOSIS — H35363 Drusen (degenerative) of macula, bilateral: Secondary | ICD-10-CM | POA: Diagnosis not present

## 2022-09-05 DIAGNOSIS — N1832 Chronic kidney disease, stage 3b: Secondary | ICD-10-CM | POA: Diagnosis not present

## 2022-09-05 DIAGNOSIS — N2581 Secondary hyperparathyroidism of renal origin: Secondary | ICD-10-CM | POA: Diagnosis not present

## 2022-09-05 DIAGNOSIS — I129 Hypertensive chronic kidney disease with stage 1 through stage 4 chronic kidney disease, or unspecified chronic kidney disease: Secondary | ICD-10-CM | POA: Diagnosis not present

## 2022-09-26 DIAGNOSIS — N1832 Chronic kidney disease, stage 3b: Secondary | ICD-10-CM | POA: Diagnosis not present

## 2022-09-28 ENCOUNTER — Ambulatory Visit: Payer: PPO | Admitting: Internal Medicine

## 2022-09-28 DIAGNOSIS — L820 Inflamed seborrheic keratosis: Secondary | ICD-10-CM | POA: Diagnosis not present

## 2022-09-28 DIAGNOSIS — L570 Actinic keratosis: Secondary | ICD-10-CM | POA: Diagnosis not present

## 2022-10-08 ENCOUNTER — Encounter: Payer: Self-pay | Admitting: Internal Medicine

## 2022-10-08 ENCOUNTER — Ambulatory Visit: Payer: PPO | Admitting: Internal Medicine

## 2022-10-08 ENCOUNTER — Other Ambulatory Visit (INDEPENDENT_AMBULATORY_CARE_PROVIDER_SITE_OTHER): Payer: PPO

## 2022-10-08 VITALS — BP 138/76 | HR 63 | Ht 70.0 in | Wt 164.1 lb

## 2022-10-08 DIAGNOSIS — J439 Emphysema, unspecified: Secondary | ICD-10-CM | POA: Diagnosis not present

## 2022-10-08 DIAGNOSIS — Z7960 Long term (current) use of unspecified immunomodulators and immunosuppressants: Secondary | ICD-10-CM

## 2022-10-08 DIAGNOSIS — K5100 Ulcerative (chronic) pancolitis without complications: Secondary | ICD-10-CM

## 2022-10-08 LAB — HEPATIC FUNCTION PANEL
ALT: 10 U/L (ref 0–53)
AST: 21 U/L (ref 0–37)
Albumin: 4.2 g/dL (ref 3.5–5.2)
Alkaline Phosphatase: 71 U/L (ref 39–117)
Bilirubin, Direct: 0.1 mg/dL (ref 0.0–0.3)
Total Bilirubin: 0.8 mg/dL (ref 0.2–1.2)
Total Protein: 6.9 g/dL (ref 6.0–8.3)

## 2022-10-08 LAB — CBC WITH DIFFERENTIAL/PLATELET
Basophils Absolute: 0.1 10*3/uL (ref 0.0–0.1)
Basophils Relative: 0.9 % (ref 0.0–3.0)
Eosinophils Absolute: 0.1 10*3/uL (ref 0.0–0.7)
Eosinophils Relative: 1.5 % (ref 0.0–5.0)
HCT: 38.9 % — ABNORMAL LOW (ref 39.0–52.0)
Hemoglobin: 13.2 g/dL (ref 13.0–17.0)
Lymphocytes Relative: 17.9 % (ref 12.0–46.0)
Lymphs Abs: 1.3 10*3/uL (ref 0.7–4.0)
MCHC: 34 g/dL (ref 30.0–36.0)
MCV: 105.7 fl — ABNORMAL HIGH (ref 78.0–100.0)
Monocytes Absolute: 0.7 10*3/uL (ref 0.1–1.0)
Monocytes Relative: 10 % (ref 3.0–12.0)
Neutro Abs: 5.2 10*3/uL (ref 1.4–7.7)
Neutrophils Relative %: 69.7 % (ref 43.0–77.0)
Platelets: 263 10*3/uL (ref 150.0–400.0)
RBC: 3.68 Mil/uL — ABNORMAL LOW (ref 4.22–5.81)
RDW: 17.7 % — ABNORMAL HIGH (ref 11.5–15.5)
WBC: 7.4 10*3/uL (ref 4.0–10.5)

## 2022-10-08 NOTE — Patient Instructions (Signed)
Your provider has requested that you go to the basement level for lab work before leaving today. Press "B" on the elevator. The lab is located at the first door on the left as you exit the elevator.   You have been scheduled for a colonoscopy. Please follow written instructions given to you at your visit today.  Please pick up your prep supplies at the pharmacy within the next 1-3 days. If you use inhalers (even only as needed), please bring them with you on the day of your procedure.   I appreciate the opportunity to care for you. Carl Gessner, MD, FACG 

## 2022-10-08 NOTE — Progress Notes (Signed)
Sean Pope 77 y.o. Feb 10, 1946 [id number]  Assessment & Plan:   Encounter Diagnoses  Name Primary?   Ulcerative chronic pancolitis without complications Yes   Long-term use of immunosuppressant medication-mercaptopurine     Pulmonary emphysema, unspecified emphysema type     Sean Pope is doing well with normal bowel movements in the setting of ulcerative colitis (universal) in the setting of 6-MP therapy.  I am not sure where he is getting his 6-MP prescription as I have not been renewing it to the best of my knowledge.  He needs regular lab follow-up on that so we will repeat some labs today and schedule a surveillance colonoscopy.  The colonoscopy will be performed at the hospital due to his severe lung disease as per prior recommendation from our anesthesia team.  The risks and benefits as well as alternatives of endoscopic procedure(s) have been discussed and reviewed. All questions answered. The patient agrees to proceed.    Orders Placed This Encounter  Procedures   Procedural/ Surgical Case Request: COLONOSCOPY WITH PROPOFOL   CBC with Differential/Platelet   Hepatic function panel   Thiopurine Metabolites   Ambulatory referral to Gastroenterology   When I see him again or when I communicate lab results we will discuss recommendation for skin exam with dermatology  CC: Sean Sax, MD Dr. Zetta Bills [facility]  Subjective:  Gastroenterology summary:  Universal ulcerative colitis diagnosed 05/28/2013   seen at colonoscopy - most prominent in rectum and sigmoid but seen throughout (patchy in right colon) Tx steroids 6 MP and Humira. + Latent TB so Humira stopped then Tx for TB. Did not want to retry biologic. 05/2016 colonoscopy pancolitis and pseudopolyps. Prednisone taper started, Entyvio added. 6 MP reduced due to anemia and high 6TG levels. 2018 he stopped Entyvio    Chief Complaint: Follow-up of ulcerative colitis  HPI Sean Pope is  a 77 year old white man with a history of ulcerative chronic pancolitis diagnosed in December 2014 and treated with mercaptopurine and Humira and then mercaptopurine alone.  I last saw him in Pope 2022 and had recommended a surveillance colonoscopy.  There were several issues at that time including his wife was ill and she subsequently passed away last year, and he has severe lung disease so it was thought that the procedure should be performed at the hospital even though he had come off of oxygen after COVID and postinflammatory issues in the setting of emphysema.  He reports that he is doing well with formed stools and no bleeding and no abdominal pain.  He still takes 6-MP as listed on his medication list though I have not been refilling it.  He is not sure where he has been getting that filled but he is convinced he remains on it.  He continues to follow with [facility] and has stable chronic kidney disease overall.  Anemia has been a problem in the past and has resolved he remains on iron therapy.  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  She remains busy with her catering business.  Last colonoscopy 06/14/2016 - Pseudopolyps in the entire examined colon. Resection not attempted. Biopsied. - Ulcerative colitis. Inflammation was found from the anus to the sigmoid colon. This was moderate in severity. Biopsied. - Diverticulosis in the sigmoid colon and in the descending colon. - The examination was otherwise normal.  1. Surgical [P], colon, pseudo polyps - FINDINGS  CONSISTENT WITH PSEUDOPOLYPS. - NO DYSPLASIA OR MALIGNANCY. 2. Surgical [P], descending - QUIESCENT COLITIS. - NO FEATURES OF MICROSCOPIC COLITIS. - NO DYSPLASIA OR MALIGNANCY. 3. Surgical [P], rectum and sigmoid - CHRONIC MILDLY ACTIVE COLITIS. - NO DYSPLASIA OR MALIGNANCY.   No Known Allergies Current Meds  Medication Sig    Budeson-Glycopyrrol-Formoterol (BREZTRI AEROSPHERE) 160-9-4.8 MCG/ACT AERO Inhale 2 puffs into the lungs in the morning and at bedtime.   chlorthalidone (HYGROTON) 25 MG tablet Take 0.5 tablets (12.5 mg total) by mouth daily.   Cholecalciferol (VITAMIN D3) 1000 units CAPS Take 1 capsule by mouth daily.   Ferrous Sulfate (IRON) 325 (65 FE) MG TABS Take 1 tablet by mouth 2 (two) times daily.    mercaptopurine (PURINETHOL) 50 MG tablet TAKE 1 AND 1/2 TABLETS DAILY BY MOUTH   Multiple Vitamins-Minerals (MENS MULTIVITAMIN PLUS PO) Take 1 tablet by mouth. Centrum Silver   vitamin B-12 (CYANOCOBALAMIN) 1000 MCG tablet Take 1,000 mcg by mouth daily.   Past Medical History:  Diagnosis Date   Acute respiratory failure with hypoxia 09/17/2011   Acute respiratory failure with hypoxia and hypercapnia 11/15/2020   AKI (acute kidney injury)    In the setting of ulcerative colitis flare, question relationship to mesalamine   Anal fissure - posterior 01/22/2014   Anemia-multifactorial 01/05/2014   Blood loss, medications, chronic illness kidney injury   ARF (acute renal failure)    B12 deficiency - borderline 12/21/2014   C. difficile colitis 06/02/2014   Chronic kidney disease, stage III (moderate) 09/08/2013   Emphysema lung    GERD (gastroesophageal reflux disease)    Hypercalcemia    Hyperlipidemia    Hypertension    Latent tuberculosis    Long-term use of immunosuppressant medication-mercaptopurine 01/05/2014   Lung collapse 09/26/2011   Osteoporosis 06/29/2014   DEXA 06/2014 - lowest t score -4.2   Pneumonia due to COVID-19 virus 11/15/2020   Posterior communicating artery aneurysm 09/17/2011   Protein-calorie malnutrition, severe 10/26/2013   Respiratory failure, acute 01/07/2014   SAH (subarachnoid hemorrhage)    Stroke    Ulcerative colitis 05/28/2013   Vitamin D deficiency 06/03/2014   Past Surgical History:  Procedure Laterality Date   ANEURYSM COILING  2013   Posterior  communicating   CATARACT EXTRACTION Bilateral aug, sept 2016   COLONOSCOPY W/ BIOPSIES  05/28/2013   IR ANGIO INTRA EXTRACRAN SEL INTERNAL CAROTID BILAT MOD SED  10/22/2017   IR ANGIO VERTEBRAL SEL VERTEBRAL UNI L MOD SED  10/22/2017   Social History   Social History Narrative   Married -widowed 2023 1 son and 1 daughter Merry Proud, lives with him, she is a Firefighter)   Clinical biochemist semiretired working as needed   Rare alcohol former smoker no drug use   family history includes Heart disease in his mother.   Review of Systems As above  Objective:   Physical Exam  138/76 (BP Location: Left Arm, Patient Position: Sitting, Cuff Size: Normal)   Pulse 63   Ht  (1.778 m)   Wt 164 lb 2 oz (74.4 kg)   SpO2 94%   BMI 23.55 kg/m @  General:  NAD Eyes:   anicteric Lungs:  Clear - decreased BS Heart::  S1S2 no rubs, murmurs or gallops Abdomen:  soft and nontender, BS+ Ext:   trace edema,    Data Reviewed:  See HPI

## 2022-10-08 NOTE — H&P (View-Only) (Signed)
 Sean Pope 77 y.o. 04/17/1946 [id number]  Assessment & Plan:   Encounter Diagnoses  Name Primary?   Ulcerative chronic pancolitis without complications Yes   Long-term use of immunosuppressant medication-mercaptopurine     Pulmonary emphysema, unspecified emphysema type     Sean Pope is doing well with normal bowel movements in the setting of ulcerative colitis (universal) in the setting of 6-MP therapy.  I am not sure where he is getting his 6-MP prescription as I have not been renewing it to the best of my knowledge.  He needs regular lab follow-up on that so we will repeat some labs today and schedule a surveillance colonoscopy.  The colonoscopy will be performed at the hospital due to his severe lung disease as per prior recommendation from our anesthesia team.  The risks and benefits as well as alternatives of endoscopic procedure(s) have been discussed and reviewed. All questions answered. The patient agrees to proceed.    Orders Placed This Encounter  Procedures   Procedural/ Surgical Case Request: COLONOSCOPY WITH PROPOFOL   CBC with Differential/Platelet   Hepatic function panel   Thiopurine Metabolites   Ambulatory referral to Gastroenterology   When I see him again or when I communicate lab results we will discuss recommendation for skin exam with dermatology  CC: Kremer, Gianpaolo Alfred, MD Dr. Jay Patel [facility]  Subjective:  Gastroenterology summary:  Universal ulcerative colitis diagnosed 05/28/2013   seen at colonoscopy - most prominent in rectum and sigmoid but seen throughout (patchy in right colon) Tx steroids 6 MP and Humira. + Latent TB so Humira stopped then Tx for TB. Did not want to retry biologic. 05/2016 colonoscopy pancolitis and pseudopolyps. Prednisone taper started, Entyvio added. 6 MP reduced due to anemia and high 6TG levels. 2018 he stopped Entyvio    Chief Complaint: Follow-up of ulcerative colitis  HPI Sean Pope is  a 77-year-old white man with a history of ulcerative chronic pancolitis diagnosed in December 2014 and treated with mercaptopurine and Humira and then mercaptopurine alone.  I last saw him in November 2022 and had recommended a surveillance colonoscopy.  There were several issues at that time including his wife was ill and she subsequently passed away last year, and he has severe lung disease so it was thought that the procedure should be performed at the hospital even though he had come off of oxygen after COVID and postinflammatory issues in the setting of emphysema.  He reports that he is doing well with formed stools and no bleeding and no abdominal pain.  He still takes 6-MP as listed on his medication list though I have not been refilling it.  He is not sure where he has been getting that filled but he is convinced he remains on it.  He continues to follow with [facility] and has stable chronic kidney disease overall.  Anemia has been a problem in the past and has resolved he remains on iron therapy.  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  She remains busy with her catering business.  Last colonoscopy 06/14/2016 - Pseudopolyps in the entire examined colon. Resection not attempted. Biopsied. - Ulcerative colitis. Inflammation was found from the anus to the sigmoid colon. This was moderate in severity. Biopsied. - Diverticulosis in the sigmoid colon and in the descending colon. - The examination was otherwise normal.  1. Surgical [P], colon, pseudo polyps - FINDINGS   CONSISTENT WITH PSEUDOPOLYPS. - NO DYSPLASIA OR MALIGNANCY. 2. Surgical [P], descending - QUIESCENT COLITIS. - NO FEATURES OF MICROSCOPIC COLITIS. - NO DYSPLASIA OR MALIGNANCY. 3. Surgical [P], rectum and sigmoid - CHRONIC MILDLY ACTIVE COLITIS. - NO DYSPLASIA OR MALIGNANCY.   No Known Allergies Current Meds  Medication Sig    Budeson-Glycopyrrol-Formoterol (BREZTRI AEROSPHERE) 160-9-4.8 MCG/ACT AERO Inhale 2 puffs into the lungs in the morning and at bedtime.   chlorthalidone (HYGROTON) 25 MG tablet Take 0.5 tablets (12.5 mg total) by mouth daily.   Cholecalciferol (VITAMIN D3) 1000 units CAPS Take 1 capsule by mouth daily.   Ferrous Sulfate (IRON) 325 (65 FE) MG TABS Take 1 tablet by mouth 2 (two) times daily.    mercaptopurine (PURINETHOL) 50 MG tablet TAKE 1 AND 1/2 TABLETS DAILY BY MOUTH   Multiple Vitamins-Minerals (MENS MULTIVITAMIN PLUS PO) Take 1 tablet by mouth. Centrum Silver   vitamin B-12 (CYANOCOBALAMIN) 1000 MCG tablet Take 1,000 mcg by mouth daily.   Past Medical History:  Diagnosis Date   Acute respiratory failure with hypoxia 09/17/2011   Acute respiratory failure with hypoxia and hypercapnia 11/15/2020   AKI (acute kidney injury)    In the setting of ulcerative colitis flare, question relationship to mesalamine   Anal fissure - posterior 01/22/2014   Anemia-multifactorial 01/05/2014   Blood loss, medications, chronic illness kidney injury   ARF (acute renal failure)    B12 deficiency - borderline 12/21/2014   C. difficile colitis 06/02/2014   Chronic kidney disease, stage III (moderate) 09/08/2013   Emphysema lung    GERD (gastroesophageal reflux disease)    Hypercalcemia    Hyperlipidemia    Hypertension    Latent tuberculosis    Long-term use of immunosuppressant medication-mercaptopurine 01/05/2014   Lung collapse 09/26/2011   Osteoporosis 06/29/2014   DEXA 06/2014 - lowest t score -4.2   Pneumonia due to COVID-19 virus 11/15/2020   Posterior communicating artery aneurysm 09/17/2011   Protein-calorie malnutrition, severe 10/26/2013   Respiratory failure, acute 01/07/2014   SAH (subarachnoid hemorrhage)    Stroke    Ulcerative colitis 05/28/2013   Vitamin D deficiency 06/03/2014   Past Surgical History:  Procedure Laterality Date   ANEURYSM COILING  2013   Posterior  communicating   CATARACT EXTRACTION Bilateral aug, sept 2016   COLONOSCOPY W/ BIOPSIES  05/28/2013   IR ANGIO INTRA EXTRACRAN SEL INTERNAL CAROTID BILAT MOD SED  10/22/2017   IR ANGIO VERTEBRAL SEL VERTEBRAL UNI L MOD SED  10/22/2017   Social History   Social History Narrative   Married -widowed 2023 1 son and 1 daughter (Brandi, lives with him, she is a caterer)   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   Review of Systems As above  Objective:   Physical Exam @BP 138/76 (BP Location: Left Arm, Patient Position: Sitting, Cuff Size: Normal)   Pulse 63   Ht 5' 10" (1.778 m)   Wt 164 lb 2 oz (74.4 kg)   SpO2 94%   BMI 23.55 kg/m @  General:  NAD Eyes:   anicteric Lungs:  Clear - decreased BS Heart::  S1S2 no rubs, murmurs or gallops Abdomen:  soft and nontender, BS+ Ext:   trace edema,    Data Reviewed:  See HPI 

## 2022-10-12 LAB — THIOPURINE METABOLITES
6 MMP(6-Methylmercaptopurine): 1011 pmol/8x10(8)RBC (ref <5700)
6 TG(6-Thioguanine): 66 pmol/8x10(8)RBC — ABNORMAL LOW (ref 235–400)

## 2022-11-05 ENCOUNTER — Ambulatory Visit: Payer: PPO | Admitting: Family Medicine

## 2022-11-06 ENCOUNTER — Encounter (HOSPITAL_COMMUNITY): Payer: Self-pay | Admitting: Internal Medicine

## 2022-11-06 ENCOUNTER — Ambulatory Visit (HOSPITAL_COMMUNITY)
Admission: RE | Admit: 2022-11-06 | Discharge: 2022-11-06 | Disposition: A | Discharge status: Home / Self Care | Payer: PPO | Source: Ambulatory Visit | Attending: Internal Medicine | Admitting: Internal Medicine

## 2022-11-06 ENCOUNTER — Ambulatory Visit (HOSPITAL_COMMUNITY): Payer: PPO | Admitting: Anesthesiology

## 2022-11-06 ENCOUNTER — Ambulatory Visit (HOSPITAL_BASED_OUTPATIENT_CLINIC_OR_DEPARTMENT_OTHER): Payer: PPO | Admitting: Anesthesiology

## 2022-11-06 ENCOUNTER — Other Ambulatory Visit: Payer: Self-pay

## 2022-11-06 ENCOUNTER — Encounter (HOSPITAL_COMMUNITY)
Admission: RE | Disposition: A | Discharge status: Home / Self Care | Payer: Self-pay | Source: Ambulatory Visit | Attending: Internal Medicine

## 2022-11-06 DIAGNOSIS — K5100 Ulcerative (chronic) pancolitis without complications: Secondary | ICD-10-CM | POA: Insufficient documentation

## 2022-11-06 DIAGNOSIS — Z09 Encounter for follow-up examination after completed treatment for conditions other than malignant neoplasm: Secondary | ICD-10-CM | POA: Insufficient documentation

## 2022-11-06 DIAGNOSIS — Z87891 Personal history of nicotine dependence: Secondary | ICD-10-CM | POA: Insufficient documentation

## 2022-11-06 DIAGNOSIS — I10 Essential (primary) hypertension: Secondary | ICD-10-CM | POA: Diagnosis not present

## 2022-11-06 DIAGNOSIS — K5730 Diverticulosis of large intestine without perforation or abscess without bleeding: Secondary | ICD-10-CM

## 2022-11-06 DIAGNOSIS — K5140 Inflammatory polyps of colon without complications: Secondary | ICD-10-CM | POA: Diagnosis not present

## 2022-11-06 DIAGNOSIS — J449 Chronic obstructive pulmonary disease, unspecified: Secondary | ICD-10-CM | POA: Diagnosis not present

## 2022-11-06 DIAGNOSIS — K5190 Ulcerative colitis, unspecified, without complications: Secondary | ICD-10-CM | POA: Diagnosis not present

## 2022-11-06 HISTORY — PX: BIOPSY: SHX5522

## 2022-11-06 HISTORY — PX: COLONOSCOPY WITH PROPOFOL: SHX5780

## 2022-11-06 SURGERY — COLONOSCOPY WITH PROPOFOL
Anesthesia: Monitor Anesthesia Care

## 2022-11-06 MED ORDER — EPHEDRINE SULFATE (PRESSORS) 50 MG/ML IJ SOLN
INTRAMUSCULAR | Status: DC | PRN
  Administered 2022-11-06: 10 mg via INTRAVENOUS

## 2022-11-06 MED ORDER — LIDOCAINE HCL (PF) 2 % IJ SOLN
INTRAMUSCULAR | Status: DC | PRN
  Administered 2022-11-06: 60 mg via INTRADERMAL

## 2022-11-06 MED ORDER — PROPOFOL 10 MG/ML IV BOLUS
INTRAVENOUS | Status: DC | PRN
  Administered 2022-11-06: 80 ug/kg/min via INTRAVENOUS

## 2022-11-06 MED ORDER — LACTATED RINGERS IV SOLN
INTRAVENOUS | Status: DC
  Administered 2022-11-06: 1000 mL via INTRAVENOUS

## 2022-11-06 MED ORDER — PROPOFOL 500 MG/50ML IV EMUL
INTRAVENOUS | Status: DC | PRN
  Administered 2022-11-06: 50 mg via INTRAVENOUS

## 2022-11-06 MED ORDER — ONDANSETRON HCL 4 MG/2ML IJ SOLN
INTRAMUSCULAR | Status: DC | PRN
  Administered 2022-11-06: 4 mg via INTRAVENOUS

## 2022-11-06 MED ORDER — PROPOFOL 500 MG/50ML IV EMUL
INTRAVENOUS | Status: AC
  Filled 2022-11-06: qty 50

## 2022-11-06 MED ORDER — DEXAMETHASONE SODIUM PHOSPHATE 10 MG/ML IJ SOLN
INTRAMUSCULAR | Status: DC | PRN
  Administered 2022-11-06: 4 mg via INTRAVENOUS

## 2022-11-06 MED ORDER — SODIUM CHLORIDE 0.9 % IV SOLN: INTRAVENOUS | Status: DC

## 2022-11-06 SURGICAL SUPPLY — 22 items

## 2022-11-06 NOTE — Discharge Instructions (Signed)
Things look good - some inflammatory polyps seen which is not surprising but the colon lining looks great otherwise. I took biopsies to look at things on a microscopic level. Will let you know.  I appreciate the opportunity to care for you. Iva Boop, MD, FACG  YOU HAD AN ENDOSCOPIC PROCEDURE TODAY: Refer to the procedure report and other information in the discharge instructions given to you for any specific questions about what was found during the examination. If this information does not answer your questions, please call Dr. Marvell Fuller office at [phone] to clarify.   YOU SHOULD EXPECT: Some feelings of bloating in the abdomen. Passage of more gas than usual. Walking can help get rid of the air that was put into your GI tract during the procedure and reduce the bloating. If you had a lower endoscopy (such as a colonoscopy or flexible sigmoidoscopy) you may notice spotting of blood in your stool or on the toilet paper. Some abdominal soreness may be present for a day or two, also.  DIET: Your first meal following the procedure should be a light meal and then it is ok to progress to your normal diet. A half-sandwich or bowl of soup is an example of a good first meal. Heavy or fried foods are harder to digest and may make you feel nauseous or bloated. Drink plenty of fluids but you should avoid alcoholic beverages for 24 hours.   ACTIVITY: Your care partner should take you home directly after the procedure. You should plan to take it easy, moving slowly for the rest of the day. You can resume normal activity the day after the procedure however YOU SHOULD NOT DRIVE, use power tools, machinery or perform tasks that involve climbing or major physical exertion for 24 hours (because of the sedation medicines used during the test).   SYMPTOMS TO REPORT IMMEDIATELY: A gastroenterologist can be reached at any hour. Please call [phone]  for any of the following symptoms:  Following lower  endoscopy (colonoscopy, flexible sigmoidoscopy) Excessive amounts of blood in the stool  Significant tenderness, worsening of abdominal pains  Swelling of the abdomen that is new, acute  Fever of 100 or higher  Following upper endoscopy (EGD, EUS, ERCP, esophageal dilation) Vomiting of blood or coffee ground material  New, significant abdominal pain  New, significant chest pain or pain under the shoulder blades  Painful or persistently difficult swallowing  New shortness of breath  Black, tarry-looking or red, bloody stools  FOLLOW UP:  If any biopsies were taken you will be contacted by phone or by letter within the next 1-3 weeks. Call [phone]  if you have not heard about the biopsies in 3 weeks.  Please also call with any specific questions about appointments or follow up tests.

## 2022-11-06 NOTE — Anesthesia Preprocedure Evaluation (Addendum)
Anesthesia Evaluation  Patient identified by MRN, date of birth, ID band Patient awake    Reviewed: Allergy & Precautions, NPO status , Patient's Chart, lab work & pertinent test results  Airway Mallampati: II  TM Distance: >3 FB Neck ROM: Full    Dental  (+) Teeth Intact, Dental Advisory Given   Pulmonary COPD, former smoker   Pulmonary exam normal breath sounds clear to auscultation       Cardiovascular hypertension, Pt. on medications Normal cardiovascular exam Rhythm:Regular Rate:Normal     Neuro/Psych CVA    GI/Hepatic Neg liver ROS, PUD,GERD  ,,UC   Endo/Other  negative endocrine ROS    Renal/GU Renal InsufficiencyRenal disease     Musculoskeletal negative musculoskeletal ROS (+)    Abdominal   Peds  Hematology negative hematology ROS (+)   Anesthesia Other Findings Day of surgery medications reviewed with the patient.  Reproductive/Obstetrics                             Anesthesia Physical Anesthesia Plan  ASA: 3  Anesthesia Plan: MAC   Post-op Pain Management:    Induction: Intravenous  PONV Risk Score and Plan: 1 and TIVA and Treatment may vary due to age or medical condition  Airway Management Planned: Natural Airway and Simple Face Mask  Additional Equipment:   Intra-op Plan:   Post-operative Plan:   Informed Consent: I have reviewed the patients History and Physical, chart, labs and discussed the procedure including the risks, benefits and alternatives for the proposed anesthesia with the patient or authorized representative who has indicated his/her understanding and acceptance.     Dental advisory given  Plan Discussed with: CRNA and Anesthesiologist  Anesthesia Plan Comments:        Anesthesia Quick Evaluation

## 2022-11-06 NOTE — Op Note (Signed)
[facility] Patient Name: Sean Pope Procedure Date: 11/06/2022 MRN: [id number] Attending MD: Iva Boop , MD, [id number] Date of Birth: 04-12-1946 CSN: [id number] Age: 77 Admit Type: Outpatient Procedure:                Colonoscopy Indications:              Chronic ulcerative pancolitis, Follow-up of chronic                            ulcerative pancolitis, Disease activity assessment                            of chronic ulcerative pancolitis Providers:                Iva Boop, MD, Janae Sauce. Steele Berg, RN, Leanne Lovely, Technician Referring MD:              Medicines:                Monitored Anesthesia Care Complications:            No immediate complications. Estimated Blood Loss:     Estimated blood loss was minimal. Procedure:                Pre-Anesthesia Assessment:                           - Prior to the procedure, a History and Physical                            was performed, and patient medications and                            allergies were reviewed. The patient's tolerance of                            previous anesthesia was also reviewed. The risks                            and benefits of the procedure and the sedation                            options and risks were discussed with the patient.                            All questions were answered, and informed consent                            was obtained. Prior Anticoagulants: The patient has                            taken no anticoagulant or antiplatelet agents. ASA  Grade Assessment: III - A patient with severe                            systemic disease. After reviewing the risks and                            benefits, the patient was deemed in satisfactory                            condition to undergo the procedure.                           After obtaining informed consent, the colonoscope                             was passed under direct vision. Throughout the                            procedure, the patient's blood pressure, pulse, and                            oxygen saturations were monitored continuously. The                            CF-HQ190L ([id number]) Olympus colonoscope was                            introduced through the anus and advanced to the the                            terminal ileum, with identification of the                            appendiceal orifice and IC valve. The colonoscopy                            was performed without difficulty. The patient                            tolerated the procedure well. The quality of the                            bowel preparation was good. The terminal ileum,                            ileocecal valve, appendiceal orifice, and rectum                            were photographed. The bowel preparation used was                            Miralax via split dose instruction. Scope In: 8:28:20 AM Scope Out: 8:41:13 AM Scope Withdrawal Time: 0 hours 11 minutes 33 seconds  Total Procedure Duration: 0  hours 12 minutes 53 seconds  Findings:      The perianal and digital rectal examinations were normal.      The terminal ileum appeared normal.      Scattered pseudopolyps were found in the entire colon.      Multiple diverticula were found in the sigmoid colon.      The exam was otherwise without abnormality on direct and retroflexion       views.      Two biopsies were taken every 10 cm with a cold forceps from the entire       colon for ulcerative colitis surveillance. These biopsy specimens from       the cecum/asc; transverse; desc/prox sig;distal sig/rectum were sent to       Pathology. Verification of patient identification for the specimen was       done. Estimated blood loss was minimal. Impression:               - The examined portion of the ileum was normal.                           - Pseudopolyps in the entire examined colon.                            - Diverticulosis in the sigmoid colon.                           - The examination was otherwise normal on direct                            and retroflexion views. Ulcerative colitis in                            endoscopic remission (clinical also - asymptomatic)                           - Biopsies for surveillance were taken from the                            entire colon. Moderate Sedation:      Not Applicable - Patient had care per Anesthesia. Recommendation:           - Patient has a contact number available for                            emergencies. The signs and symptoms of potential                            delayed complications were discussed with the                            patient. Return to normal activities tomorrow.                            Written discharge instructions were provided to the  patient.                           - Resume previous diet.                           - Continue present medications.                           - Await pathology results.                           - No recommendation at this time regarding repeat                            colonoscopy due to age. Procedure Code(s):        --- Professional ---                           [phone], Colonoscopy, flexible; with biopsy, single                            or multiple Diagnosis Code(s):        --- Professional ---                           K51.40, Inflammatory polyps of colon without                            complications                           K51.00, Ulcerative (chronic) pancolitis without                            complications                           K57.30, Diverticulosis of large intestine without                            perforation or abscess without bleeding CPT copyright 2022 American [employer] Association. All rights reserved. The codes documented in this report are preliminary and upon coder review may  be revised to  meet current compliance requirements. Iva Boop, MD 11/06/2022 8:53:21 AM This report has been signed electronically. Number of Addenda: 0

## 2022-11-06 NOTE — Transfer of Care (Signed)
Immediate Anesthesia Transfer of Care Note  Patient: Sean Pope  Procedure(s) Performed: COLONOSCOPY WITH PROPOFOL BIOPSY  Patient Location: PACU and Endoscopy Unit  Anesthesia Type:MAC  Level of Consciousness: awake, alert , oriented, and patient cooperative  Airway & Oxygen Therapy: Patient Spontanous Breathing and Patient connected to face mask oxygen  Post-op Assessment: Report given to RN, Post -op Vital signs reviewed and stable, and Patient moving all extremities  Post vital signs: Reviewed and stable  Last Vitals:  Vitals Value Taken Time  BP 114/52 11/06/22 0850  Temp 36.5 C 11/06/22 0850  Pulse 72 11/06/22 0854  Resp 18 11/06/22 0854  SpO2 100 % 11/06/22 0854  Vitals shown include unvalidated device data.  Last Pain:  Vitals:   11/06/22 0850  TempSrc: Tympanic  PainSc: 0-No pain         Complications: No notable events documented.

## 2022-11-06 NOTE — Anesthesia Postprocedure Evaluation (Signed)
Anesthesia Post Note  Patient: MANSOUR LOGGINS  Procedure(s) Performed: COLONOSCOPY WITH PROPOFOL BIOPSY     Patient location during evaluation: Endoscopy Anesthesia Type: MAC Level of consciousness: oriented, awake and alert and awake Pain management: pain level controlled Vital Signs Assessment: post-procedure vital signs reviewed and stable Respiratory status: spontaneous breathing, nonlabored ventilation, respiratory function stable and patient connected to nasal cannula oxygen Cardiovascular status: blood pressure returned to baseline and stable Postop Assessment: no headache, no backache and no apparent nausea or vomiting Anesthetic complications: no   No notable events documented.  Last Vitals:  Vitals:   11/06/22 0900 11/06/22 0908  BP: 127/60 132/66  Pulse: 62 69  Resp: 20 (!) 21  Temp:    SpO2: 98% 96%    Last Pain:  Vitals:   11/06/22 0908  TempSrc:   PainSc: 0-No pain                 Collene Schlichter

## 2022-11-06 NOTE — Interval H&P Note (Signed)
History and Physical Interval Note:  11/06/2022 8:15 AM  Sean Pope  has presented today for surgery, with the diagnosis of UC.  The various methods of treatment have been discussed with the patient and family. After consideration of risks, benefits and other options for treatment, the patient has consented to  Procedure(s): COLONOSCOPY WITH PROPOFOL (N/A) as a surgical intervention.  The patient's history has been reviewed, patient examined, no change in status, stable for surgery.  I have reviewed the patient's chart and labs.  Questions were answered to the patient's satisfaction.     Stan Head

## 2022-11-07 ENCOUNTER — Encounter (HOSPITAL_COMMUNITY): Payer: Self-pay | Admitting: Internal Medicine

## 2022-11-08 ENCOUNTER — Other Ambulatory Visit: Payer: Self-pay | Admitting: Internal Medicine

## 2022-11-08 DIAGNOSIS — K5100 Ulcerative (chronic) pancolitis without complications: Secondary | ICD-10-CM

## 2022-11-08 DIAGNOSIS — Z7960 Long term (current) use of unspecified immunomodulators and immunosuppressants: Secondary | ICD-10-CM

## 2022-11-08 LAB — SURGICAL PATHOLOGY

## 2022-11-13 ENCOUNTER — Encounter: Payer: Self-pay | Admitting: Family Medicine

## 2022-11-13 ENCOUNTER — Ambulatory Visit (INDEPENDENT_AMBULATORY_CARE_PROVIDER_SITE_OTHER): Payer: PPO | Admitting: Family Medicine

## 2022-11-13 VITALS — BP 122/72 | HR 65 | Temp 98.6°F | Ht 70.0 in | Wt 161.6 lb

## 2022-11-13 DIAGNOSIS — M7989 Other specified soft tissue disorders: Secondary | ICD-10-CM | POA: Diagnosis not present

## 2022-11-13 DIAGNOSIS — E538 Deficiency of other specified B group vitamins: Secondary | ICD-10-CM

## 2022-11-13 DIAGNOSIS — N1831 Chronic kidney disease, stage 3a: Secondary | ICD-10-CM

## 2022-11-13 DIAGNOSIS — D508 Other iron deficiency anemias: Secondary | ICD-10-CM

## 2022-11-13 LAB — BASIC METABOLIC PANEL
BUN: 49 mg/dL — ABNORMAL HIGH (ref 6–23)
CO2: 33 mEq/L — ABNORMAL HIGH (ref 19–32)
Calcium: 9.4 mg/dL (ref 8.4–10.5)
Chloride: 96 mEq/L (ref 96–112)
Creatinine, Ser: 2.62 mg/dL — ABNORMAL HIGH (ref 0.40–1.50)
GFR: 22.89 mL/min — ABNORMAL LOW (ref >60.00)
Glucose, Bld: 84 mg/dL (ref 70–99)
Potassium: 3.8 mEq/L (ref 3.5–5.1)
Sodium: 140 mEq/L (ref 135–145)

## 2022-11-13 LAB — VITAMIN B12: Vitamin B-12: >1500 pg/mL — ABNORMAL HIGH (ref 211–911)

## 2022-11-13 NOTE — Progress Notes (Signed)
Established Patient Office Visit   Subjective:  Patient ID: Sean Pope, male    DOB: 1945-12-10  Age: 77 y.o. MRN: [id number]  Chief Complaint  Patient presents with   Medical Management of Chronic Issues    3 month follow up, no concerns. Patient not fasting.     HPI Encounter Diagnoses  Name Primary?   Stage 3a chronic kidney disease (HCC) Yes   Other iron deficiency anemia    B12 deficiency - borderline    Swelling of both lower extremities    Doing okay.  Status post recent colonoscopy with negative biopsies.  Status post follow-up with pulmonology.  Continues Breztri and as needed albuterol.  Does okay unless he is out in the heat.  Trying to avoid supplemental oxygen.  Active taking his dog for walks.  Continue supplementation with iron and B12.   Review of Systems  Constitutional: Negative.   HENT: Negative.    Eyes:  Negative for blurred vision, discharge and redness.  Respiratory:  Positive for cough and shortness of breath. Negative for sputum production.   Cardiovascular: Negative.   Gastrointestinal:  Negative for abdominal pain.  Genitourinary: Negative.   Musculoskeletal: Negative.  Negative for myalgias.  Skin:  Negative for rash.  Neurological:  Negative for tingling, loss of consciousness and weakness.  Endo/Heme/Allergies:  Negative for polydipsia.     Current Outpatient Medications:    Budeson-Glycopyrrol-Formoterol (BREZTRI AEROSPHERE) 160-9-4.8 MCG/ACT AERO, Inhale 2 puffs into the lungs in the morning and at bedtime. (Patient taking differently: Inhale 2 puffs into the lungs 2 (two) times daily as needed (Shortness of breath).), Disp: 10.7 g, Rfl: 6   Cholecalciferol (VITAMIN D3) 50 MCG (2000 UT) TABS, Take 50 Units by mouth daily., Disp: , Rfl:    Ferrous Sulfate (IRON) 325 (65 FE) MG TABS, Take 65 mg by mouth 2 (two) times daily., Disp: , Rfl:    furosemide (LASIX) 40 MG tablet, Take 80 mg by mouth daily., Disp: , Rfl:    mercaptopurine  (PURINETHOL) 50 MG tablet, TAKE 1 AND 1/2 TABLETS DAILY BY MOUTH, Disp: 135 tablet, Rfl: 0   Multiple Vitamins-Minerals (MENS MULTIVITAMIN PLUS PO), Take 1 tablet by mouth. Centrum Silver, Disp: , Rfl:    vitamin B-12 (CYANOCOBALAMIN) 1000 MCG tablet, Take 1,000 mcg by mouth daily., Disp: , Rfl:    Objective:     BP 122/72 (BP Location: Right Arm, Patient Position: Sitting, Cuff Size: Normal)   Pulse 65   Temp 98.6 F (37 C) (Temporal)   Ht 5\' 10"  (1.778 m)   Wt 161 lb 9.6 oz (73.3 kg)   SpO2 95%   BMI 23.19 kg/m    Physical Exam Constitutional:      General: He is not in acute distress.    Appearance: Normal appearance. He is not ill-appearing, toxic-appearing or diaphoretic.  HENT:     Head: Normocephalic and atraumatic.     Right Ear: External ear normal.     Left Ear: External ear normal.  Eyes:     General: No scleral icterus.       Right eye: No discharge.        Left eye: No discharge.     Extraocular Movements: Extraocular movements intact.     Conjunctiva/sclera: Conjunctivae normal.  Cardiovascular:     Heart sounds: Heart sounds are distant.  Pulmonary:     Effort: Pulmonary effort is normal. No respiratory distress.     Breath sounds: Decreased air movement present. Decreased  breath sounds present.  Musculoskeletal:     Right lower leg: Swelling present. No edema.     Left lower leg: Swelling present. No edema.  Skin:    General: Skin is warm and dry.  Neurological:     Mental Status: He is alert and oriented to person, place, and time.  Psychiatric:        Mood and Affect: Mood normal.        Behavior: Behavior normal.   left look good   No results found for any visits on 11/13/22.    The ASCVD Risk score (Arnett DK, et al., 2019) failed to calculate for the following reasons:   The patient has a prior MI or stroke diagnosis    Assessment & Plan:   Stage 3a chronic kidney disease (HCC) -     Basic metabolic panel  Other iron deficiency  anemia -     Iron, TIBC and Ferritin Panel  B12 deficiency - borderline -     Vitamin B12  Swelling of both lower extremities    Return in about 6 months (around 05/16/2023), or if symptoms worsen or fail to improve.  Continue compression stockings.  Could take an extra 20 mg of furosemide in the afternoon if needed for swelling.  Will clear with cardiology next week and follow-up appointment.  Mliss Sax, MD

## 2022-11-14 LAB — IRON,TIBC AND FERRITIN PANEL
%SAT: 34 % (calc) (ref 20–48)
Ferritin: 92 ng/mL (ref 24–380)
Iron: 78 ug/dL (ref 50–180)
TIBC: 229 mcg/dL (calc) — ABNORMAL LOW (ref 250–425)

## 2022-11-26 ENCOUNTER — Telehealth: Payer: Self-pay | Admitting: Internal Medicine

## 2022-11-26 MED ORDER — MERCAPTOPURINE 50 MG PO TABS: ORAL_TABLET | ORAL | 1 refills | Status: DC

## 2022-11-26 NOTE — Telephone Encounter (Signed)
Inbound call from patient requesting a call back regarding concerns for mercaptopurine medication. Did not give further detail. Please advise, thank you.

## 2022-11-26 NOTE — Telephone Encounter (Signed)
Mercaptopurine refilled  Do want CBC and LFT in august

## 2022-11-26 NOTE — Telephone Encounter (Signed)
Pt was made aware of Dr. Gessner recommendations Pt verbalized understanding with all questions answered.   

## 2022-11-26 NOTE — Telephone Encounter (Signed)
Pt called and stated that he had several questions and wanted clarification. Pt stated that PJ called him the other day and stated that he needed blood work completed in the beginning of August. Pt was made aware that this is correct.  Pt wanted to know if Dr Leone Payor wanted pt  to continue to take the Mercaptopurine after recent colonoscopy results and if so then he is requesting a refill. Please advise

## 2022-11-26 NOTE — Telephone Encounter (Signed)
Inbound call from patient returning phone call. Requesting a call back. Please advise, thank you.  

## 2022-11-26 NOTE — Telephone Encounter (Signed)
Left message for pt to call back  °

## 2022-11-27 ENCOUNTER — Telehealth: Payer: Self-pay | Admitting: Pulmonary Disease

## 2022-11-28 NOTE — Telephone Encounter (Signed)
Pt called back, I informed pt he would have to come in for a appointment to be walked on a POC for [place] to determine his oxygen usage. Pt has been scheduled for June 13th at 1pm. Nothing further needed at this time.

## 2022-11-28 NOTE — Telephone Encounter (Signed)
Patient calling to see what liter flow for oxygen, Patient phone number is [phone].

## 2022-11-28 NOTE — Telephone Encounter (Signed)
ATC pt no answer Lvmm for pt to call the office back.

## 2022-11-29 ENCOUNTER — Ambulatory Visit (INDEPENDENT_AMBULATORY_CARE_PROVIDER_SITE_OTHER): Payer: PPO | Admitting: Pulmonary Disease

## 2022-11-29 ENCOUNTER — Encounter: Payer: Self-pay | Admitting: Pulmonary Disease

## 2022-11-29 VITALS — BP 122/74 | HR 77 | Ht 70.0 in | Wt 161.8 lb

## 2022-11-29 DIAGNOSIS — J432 Centrilobular emphysema: Secondary | ICD-10-CM

## 2022-11-29 DIAGNOSIS — J9611 Chronic respiratory failure with hypoxia: Secondary | ICD-10-CM

## 2022-11-29 NOTE — Patient Instructions (Addendum)
Continue breztri inhaler 2 puffs twice daily - rinse mouth out after each use  We will order you a portable oxygen concentrator to use 2L pulsed  We will check an overnight oximetry test to see if you need to use oxygen when sleeping at night  Follow up in 6 months

## 2022-11-29 NOTE — Progress Notes (Signed)
Synopsis: Referred in June 2022 for COPD/Emphysema  Subjective:   PATIENT ID: Sean Pope GENDER: male DOB: 1946/02/10, MRN: [id number]   HPI  Chief Complaint  Patient presents with   Follow-up    F/U for O2. States he wants to qualify for a POC. Has been checking his O2 levels at home on room air, they have been running around 95%.    Sean Pope is a 77 year old male, former smoker with severe COPD/Emphysema who returns to pulmonary clinic for follow up.   He has requested evaluation for portable oxygen concentrator. He has increased dyspnea with exertion over recent months. He has increased swelling in the left leg, if he moves then the swelling goes away. He has been having increase chest congestion and wheezing recently.   On simple walk today he desaturated to 86%. He was titrated to 2L pulsed O2 via portable oxygen concentrator.   OV 08/09/22 He remains on breztri inhaler 2 puffs twice daily. He complains of coughing up phlegm during the morning time. The mucous is clear to yellow. No sinus congestion or drainage. He does have reflux and is using an OTC medicine.   OV 12/14/20 He reports his breathing is close to being back to baseline.  He denies any cough, sputum production or wheezing.  He is using DuoNeb nebulizer solutions as needed.  He reports significant limitations to his daily activities due to the exertional dyspnea.  He is accompanied by his daughter today and she is asking if he should be on any maintenance inhaler therapy for his COPD/emphysema.  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  He notes a significant decrease in his activity as he has remained around the house to care for his wife.  Past Medical History:  Diagnosis Date   Acute respiratory failure with hypoxia (HCC) 09/17/2011   Acute respiratory failure with hypoxia and hypercapnia (HCC) 11/15/2020   AKI  (acute kidney injury) (HCC)    In the setting of ulcerative colitis flare, question relationship to mesalamine   Anal fissure - posterior 01/22/2014   Anemia-multifactorial 01/05/2014   Blood loss, medications, chronic illness kidney injury   ARF (acute renal failure) (HCC)    B12 deficiency - borderline 12/21/2014   C. difficile colitis 06/02/2014   Chronic kidney disease, stage III (moderate) (HCC) 09/08/2013   Emphysema lung (HCC)    GERD (gastroesophageal reflux disease)    Hypercalcemia    Hyperlipidemia    Hypertension    Latent tuberculosis    Long-term use of immunosuppressant medication-mercaptopurine 01/05/2014   Lung collapse 09/26/2011   Osteoporosis 06/29/2014   DEXA 06/2014 - lowest t score -4.2   Pneumonia due to COVID-19 virus 11/15/2020   Posterior communicating artery aneurysm 09/17/2011   Protein-calorie malnutrition, severe (HCC) 10/26/2013   Respiratory failure, acute (HCC) 01/07/2014   SAH (subarachnoid hemorrhage) (HCC)    Stroke (HCC)    Ulcerative colitis (HCC) 05/28/2013   Vitamin D deficiency 06/03/2014     Family History  Problem Relation Age of Onset   Heart disease Mother      Social History   Socioeconomic History   Marital status: Widowed    Spouse name: Not on file   Number of children: 2   Years of education: Not on file   Highest education level: Not on file  Occupational History   Occupation: Interior and spatial designer  Comment: [employer]    Employer: RETIRED  Tobacco Use   Smoking status: Former    Packs/day: 0.50    Years: 50.00    Additional pack years: 0.00    Total pack years: 25.00    Types: Cigarettes    Quit date: 06/19/2011    Years since quitting: 11.4   Smokeless tobacco: Never  Vaping Use   Vaping Use: Never used  Substance and Sexual Activity   Alcohol use: Yes    Alcohol/week: 0.0 standard drinks of alcohol    Comment: rare   Drug use: No   Sexual activity: Not Currently  Other Topics Concern   Not on file  Social History  Narrative   Married -widowed 2023 1 son and 1 daughter Merry Proud, lives with him, she is a Firefighter)   Clinical biochemist semiretired working as needed   Rare alcohol former smoker no drug use   Social Determinants of Corporate investment banker Strain: Low Risk  (08/20/2022)   Overall Financial Resource Strain (CARDIA)    Difficulty of Paying Living Expenses: Not hard at all  Food Insecurity: No Food Insecurity (08/20/2022)   Hunger Vital Sign    Worried About Running Out of Food in the Last Year: Never true    Ran Out of Food in the Last Year: Never true  Transportation Needs: No Transportation Needs (08/20/2022)   PRAPARE - Administrator, Civil Service (Medical): No    Lack of Transportation (Non-Medical): No  Physical Activity: Inactive (08/20/2022)   Exercise Vital Sign    Days of Exercise per Week: 0 days    Minutes of Exercise per Session: 0 min  Stress: No Stress Concern Present (08/20/2022)   [employer] of Occupational Health - Occupational Stress Questionnaire    Feeling of Stress : Not at all  Social Connections: Not on file  Intimate Partner Violence: Not on file     No Known Allergies   Outpatient Medications Prior to Visit  Medication Sig Dispense Refill   Budeson-Glycopyrrol-Formoterol (BREZTRI AEROSPHERE) 160-9-4.8 MCG/ACT AERO Inhale 2 puffs into the lungs in the morning and at bedtime. (Patient taking differently: Inhale 2 puffs into the lungs 2 (two) times daily as needed (Shortness of breath).) 10.7 g 6   Cholecalciferol (VITAMIN D3) 50 MCG (2000 UT) TABS Take 50 Units by mouth daily.     Ferrous Sulfate (IRON) 325 (65 FE) MG TABS Take 65 mg by mouth 2 (two) times daily.     furosemide (LASIX) 40 MG tablet Take 80 mg by mouth daily.     mercaptopurine (PURINETHOL) 50 MG tablet TAKE 1 AND 1/2 TABLETS DAILY BY MOUTH 135 tablet 1   Multiple Vitamins-Minerals (MENS MULTIVITAMIN PLUS PO) Take 1 tablet by mouth. Centrum Silver      vitamin B-12 (CYANOCOBALAMIN) 1000 MCG tablet Take 1,000 mcg by mouth daily.     No facility-administered medications prior to visit.    Review of Systems  Constitutional:  Negative for chills, fever, malaise/fatigue and weight loss.  HENT:  Negative for congestion, sinus pain and sore throat.   Eyes: Negative.   Respiratory:  Positive for shortness of breath. Negative for cough, hemoptysis, sputum production and wheezing.   Cardiovascular:  Negative for chest pain, palpitations, orthopnea, claudication and leg swelling.  Gastrointestinal:  Positive for heartburn. Negative for abdominal pain, nausea and vomiting.  Genitourinary: Negative.   Musculoskeletal:  Negative for joint pain and myalgias.  Skin:  Negative for rash.  Neurological:  Negative for weakness.  Endo/Heme/Allergies: Negative.   Psychiatric/Behavioral: Negative.      Objective:   Vitals:   11/29/22 1303  BP: 122/74  Pulse: 77  SpO2: 92%  Weight: 161 lb 12.8 oz (73.4 kg)  Height: 5\' 10"  (1.778 m)    Physical Exam Constitutional:      General: He is not in acute distress.    Appearance: Normal appearance.  HENT:     Head: Normocephalic and atraumatic.  Eyes:     Conjunctiva/sclera: Conjunctivae normal.  Cardiovascular:     Rate and Rhythm: Normal rate and regular rhythm.     Pulses: Normal pulses.     Heart sounds: Normal heart sounds. No murmur heard. Pulmonary:     Effort: Pulmonary effort is normal.     Breath sounds: Decreased breath sounds present. No wheezing, rhonchi or rales.  Musculoskeletal:     Right lower leg: No edema.     Left lower leg: No edema.  Skin:    General: Skin is warm and dry.  Neurological:     General: No focal deficit present.     Mental Status: He is alert.    CBC    Component Value Date/Time   WBC 7.4 10/08/2022 1111   RBC 3.68 (L) 10/08/2022 1111   HGB 13.2 10/08/2022 1111   HCT 38.9 (L) 10/08/2022 1111   PLT 263.0 10/08/2022 1111   MCV 105.7 (H) 10/08/2022  1111   MCH 35.3 (H) 11/17/2020 0241   MCHC 34.0 10/08/2022 1111   RDW 17.7 (H) 10/08/2022 1111   LYMPHSABS 1.3 10/08/2022 1111   MONOABS 0.7 10/08/2022 1111   EOSABS 0.1 10/08/2022 1111   BASOSABS 0.1 10/08/2022 1111      Latest Ref Rng & Units 11/13/2022    1:50 PM 05/04/2022    9:38 AM 04/19/2021   10:03 AM  BMP  Glucose 70 - 99 mg/dL 84  85  782   BUN 6 - 23 mg/dL 49  34  42   Creatinine 0.40 - 1.50 mg/dL 9.56  2.13  0.86   Sodium 135 - 145 mEq/L 140  143  141   Potassium 3.5 - 5.1 mEq/L 3.8  4.1  3.8   Chloride 96 - 112 mEq/L 96  102  103   CO2 19 - 32 mEq/L 33  35  28   Calcium 8.4 - 10.5 mg/dL 9.4  9.2  57.8    Chest imaging: CXR 11/15/20 COPD with pulmonary hyperinflation and emphysema. Mild scarring in the left lung base.  PFT:    Latest Ref Rng & Units 04/23/2014   10:33 AM  PFT Results  FVC-Pre L 2.86   FVC-Predicted Pre % 67   FVC-Post L 3.09   FVC-Predicted Post % 73   Pre FEV1/FVC % % 41   Post FEV1/FCV % % 42   FEV1-Pre L 1.18   FEV1-Predicted Pre % 37   FEV1-Post L 1.29   DLCO uncorrected ml/min/mmHg 11.76   DLCO UNC% % 38   DLVA Predicted % 47   TLC L 7.66   TLC % Predicted % 113   RV % Predicted % 170   PFT 2015: Severe obstructive defect, air trapping present severe diffusion defect.  Echo 11/15/2020: LVEF 55 to 60%.  LV systolic function is normal.  Grade 1 diastolic dysfunction present.  RV systolic function is normal.  RV size is normal.  Mildly elevated PASP at 43 mmHg.  Trivial mitral valve regurgitation.  Mild to moderate aortic valve sclerosis/calcification.  Assessment & Plan:   Centrilobular emphysema (HCC) - Plan: Pulse oximetry, overnight, CANCELED: Pulse oximetry, overnight  Chronic hypoxemic respiratory failure (HCC)  Discussion: Sean Pope is a 77 year old male, former smoker with severe COPD/Emphysema who returns to pulmonary clinic for follow up.   He is to continue breztri 2 puffs twice daily and as needed albuterol.    We have placed order for him to receive a portable oxygen concentrator at 2L pulsed.   Check nocturnal oximetry on room air.  Follow-up in 6 months.  Melody Comas, MD [facility] Office: [phone]    Current Outpatient Medications:    Budeson-Glycopyrrol-Formoterol (BREZTRI AEROSPHERE) 160-9-4.8 MCG/ACT AERO, Inhale 2 puffs into the lungs in the morning and at bedtime. (Patient taking differently: Inhale 2 puffs into the lungs 2 (two) times daily as needed (Shortness of breath).), Disp: 10.7 g, Rfl: 6   Cholecalciferol (VITAMIN D3) 50 MCG (2000 UT) TABS, Take 50 Units by mouth daily., Disp: , Rfl:    Ferrous Sulfate (IRON) 325 (65 FE) MG TABS, Take 65 mg by mouth 2 (two) times daily., Disp: , Rfl:    furosemide (LASIX) 40 MG tablet, Take 80 mg by mouth daily., Disp: , Rfl:    mercaptopurine (PURINETHOL) 50 MG tablet, TAKE 1 AND 1/2 TABLETS DAILY BY MOUTH, Disp: 135 tablet, Rfl: 1   Multiple Vitamins-Minerals (MENS MULTIVITAMIN PLUS PO), Take 1 tablet by mouth. Centrum Silver, Disp: , Rfl:    vitamin B-12 (CYANOCOBALAMIN) 1000 MCG tablet, Take 1,000 mcg by mouth daily., Disp: , Rfl:

## 2022-11-30 ENCOUNTER — Telehealth: Payer: Self-pay | Admitting: Pulmonary Disease

## 2022-11-30 ENCOUNTER — Other Ambulatory Visit: Payer: Self-pay

## 2022-11-30 DIAGNOSIS — J9611 Chronic respiratory failure with hypoxia: Secondary | ICD-10-CM

## 2022-11-30 NOTE — Telephone Encounter (Signed)
I have spoke with Sean Pope he was told that he would be able to get POC. When I called him to verify if he currently had 02 and which DME he stated that he doesn't have 02 at all in the home. I tried to explain that he would have to get home concentrator continuous and stationary 1st.  I told him I would have to get the 02 order corrected before I could send to DME. I sent message to Verlon Au about this

## 2022-11-30 NOTE — Telephone Encounter (Signed)
PT calling wondering about his [place] order. Wonders why they were not going to deliver it today.States he was under the impression from the Dr. This would be completed and delivered to him immediately. I told him the order had been input but the PCC"s need to process and that it takes a few days. He is insisting on an immediate call back and is highly agitated about the delay.    Please call him @ [phone]

## 2022-12-03 NOTE — Telephone Encounter (Signed)
Pt. Called to find out why the O2 company has not called him yet but on the referral it say waiting on signature

## 2022-12-03 NOTE — Telephone Encounter (Signed)
The order was not placed as STAT or urgent still waiting for Dr. Francine Graven to sign the order

## 2022-12-04 ENCOUNTER — Telehealth: Payer: Self-pay | Admitting: Pulmonary Disease

## 2022-12-04 DIAGNOSIS — J9611 Chronic respiratory failure with hypoxia: Secondary | ICD-10-CM | POA: Diagnosis not present

## 2022-12-04 NOTE — Telephone Encounter (Signed)
[employer] called to request updated orders for the patient's POC.  Also they stated that they would need stats as well.  Please call to discuss further.  CB# [phone]

## 2022-12-04 NOTE — Telephone Encounter (Signed)
Spoke to pt. The order has been sent now signed to [employer] closing encounter

## 2022-12-05 NOTE — Telephone Encounter (Signed)
Pt stats are in his charts as well as the order was just placed on 6/14. [facility] please advise

## 2022-12-05 NOTE — Telephone Encounter (Signed)
Spoke to [employer].  This has been taken care of.  Nothing further needed.

## 2022-12-17 DIAGNOSIS — J432 Centrilobular emphysema: Secondary | ICD-10-CM | POA: Diagnosis not present

## 2022-12-24 DIAGNOSIS — L570 Actinic keratosis: Secondary | ICD-10-CM | POA: Diagnosis not present

## 2022-12-24 DIAGNOSIS — L820 Inflamed seborrheic keratosis: Secondary | ICD-10-CM | POA: Diagnosis not present

## 2023-01-03 DIAGNOSIS — J9611 Chronic respiratory failure with hypoxia: Secondary | ICD-10-CM | POA: Diagnosis not present

## 2023-01-07 DIAGNOSIS — N189 Chronic kidney disease, unspecified: Secondary | ICD-10-CM | POA: Diagnosis not present

## 2023-01-07 DIAGNOSIS — I129 Hypertensive chronic kidney disease with stage 1 through stage 4 chronic kidney disease, or unspecified chronic kidney disease: Secondary | ICD-10-CM | POA: Diagnosis not present

## 2023-01-07 DIAGNOSIS — N1832 Chronic kidney disease, stage 3b: Secondary | ICD-10-CM | POA: Diagnosis not present

## 2023-01-07 DIAGNOSIS — N2581 Secondary hyperparathyroidism of renal origin: Secondary | ICD-10-CM | POA: Diagnosis not present

## 2023-01-08 LAB — LAB REPORT - SCANNED: EGFR: 27

## 2023-01-10 ENCOUNTER — Ambulatory Visit: Payer: PPO | Admitting: Pulmonary Disease

## 2023-01-10 ENCOUNTER — Encounter: Payer: Self-pay | Admitting: Pulmonary Disease

## 2023-01-10 VITALS — BP 140/80 | HR 71 | Temp 98.3°F | Ht 71.0 in | Wt 163.2 lb

## 2023-01-10 DIAGNOSIS — J9611 Chronic respiratory failure with hypoxia: Secondary | ICD-10-CM

## 2023-01-10 DIAGNOSIS — G4734 Idiopathic sleep related nonobstructive alveolar hypoventilation: Secondary | ICD-10-CM

## 2023-01-10 DIAGNOSIS — J432 Centrilobular emphysema: Secondary | ICD-10-CM | POA: Diagnosis not present

## 2023-01-10 NOTE — Patient Instructions (Addendum)
Use 2L of oxygen at night when sleeping  Use portable oxygen machine when walking around  Your sleep oxygen test shows that you spent 4hrs and 30 minutes with an Oxygen level less than 88%.   Continue breztri inhaler 2 puffs twice daily  Follow up in 6 months

## 2023-01-10 NOTE — Progress Notes (Signed)
Synopsis: Referred in June 2022 for COPD/Emphysema  Subjective:   PATIENT ID: Sean Pope GENDER: male DOB: [birth year], MRN: [id number]  HPI  Chief Complaint  Patient presents with   Follow-up    Breathing is unchanged. He would like to discuss ONO.    Sean Pope is a 77 year old male, former smoker with severe COPD/Emphysema who returns to pulmonary clinic for follow up.   He has been doing ok since last visit. ONO shows 4hr 23 minutes with SpO2 88% or less. We discussed these results and using 2L of O2 at night. He is wanting to start walking more on a regular basis. Recommended he use his POC.   OV 11/29/22 He has requested evaluation for portable oxygen concentrator. He has increased dyspnea with exertion over recent months. He has increased swelling in the left leg, if he moves then the swelling goes away. He has been having increase chest congestion and wheezing recently.   On simple walk today he desaturated to 86%. He was titrated to 2L pulsed O2 via portable oxygen concentrator.   OV 08/09/22 He remains on breztri inhaler 2 puffs twice daily. He complains of coughing up phlegm during the morning time. The mucous is clear to yellow. No sinus congestion or drainage. He does have reflux and is using an OTC medicine.   OV 12/14/20 He reports his breathing is close to being back to baseline.  He denies any cough, sputum production or wheezing.  He is using DuoNeb nebulizer solutions as needed.  He reports significant limitations to his daily activities due to the exertional dyspnea.  He is accompanied by his daughter today and she is asking if he should be on any maintenance inhaler therapy for his COPD/emphysema.  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  He notes a significant decrease in his activity as he has remained around the house to care for his wife.  Past Medical History:   Diagnosis Date   Acute respiratory failure with hypoxia (HCC) 09/17/2011   Acute respiratory failure with hypoxia and hypercapnia (HCC) 11/15/2020   AKI (acute kidney injury) (HCC)    In the setting of ulcerative colitis flare, question relationship to mesalamine   Anal fissure - posterior 01/22/2014   Anemia-multifactorial 01/05/2014   Blood loss, medications, chronic illness kidney injury   ARF (acute renal failure) (HCC)    B12 deficiency - borderline 12/21/2014   C. difficile colitis 06/02/2014   Chronic kidney disease, stage III (moderate) (HCC) 09/08/2013   Emphysema lung (HCC)    GERD (gastroesophageal reflux disease)    Hypercalcemia    Hyperlipidemia    Hypertension    Latent tuberculosis    Long-term use of immunosuppressant medication-mercaptopurine 01/05/2014   Lung collapse 09/26/2011   Osteoporosis 06/29/2014   DEXA 06/2014 - lowest t score -4.2   Pneumonia due to COVID-19 virus 11/15/2020   Posterior communicating artery aneurysm 09/17/2011   Protein-calorie malnutrition, severe (HCC) 10/26/2013   Respiratory failure, acute (HCC) 01/07/2014   SAH (subarachnoid hemorrhage) (HCC)    Stroke (HCC)    Ulcerative colitis (HCC) 05/28/2013   Vitamin D deficiency 06/03/2014     Family History  Problem Relation Age of Onset   Heart disease Mother      Social History   Socioeconomic History   Marital status: Widowed    Spouse name: Not on file  Number of children: 2   Years of education: Not on file   Highest education level: Not on file  Occupational History   Occupation: Director    Comment: [employer]    Employer: RETIRED  Tobacco Use   Smoking status: Former    Current packs/day: 0.00    Average packs/day: 0.5 packs/day for 50.0 years (25.0 ttl pk-yrs)    Types: Cigarettes    Start date: 06/18/1961    Quit date: 06/19/2011    Years since quitting: 11.5   Smokeless tobacco: Never  Vaping Use   Vaping status: Never Used  Substance and Sexual Activity    Alcohol use: Yes    Alcohol/week: 0.0 standard drinks of alcohol    Comment: rare   Drug use: No   Sexual activity: Not Currently  Other Topics Concern   Not on file  Social History Narrative   Married -widowed 2023 1 son and 1 daughter Merry Proud, lives with him, she is a Firefighter)   Clinical biochemist semiretired working as needed   Rare alcohol former smoker no drug use   Social Determinants of Corporate investment banker Strain: Low Risk  (08/20/2022)   Overall Financial Resource Strain (CARDIA)    Difficulty of Paying Living Expenses: Not hard at all  Food Insecurity: No Food Insecurity (08/20/2022)   Hunger Vital Sign    Worried About Running Out of Food in the Last Year: Never true    Ran Out of Food in the Last Year: Never true  Transportation Needs: No Transportation Needs (08/20/2022)   PRAPARE - Administrator, Civil Service (Medical): No    Lack of Transportation (Non-Medical): No  Physical Activity: Inactive (08/20/2022)   Exercise Vital Sign    Days of Exercise per Week: 0 days    Minutes of Exercise per Session: 0 min  Stress: No Stress Concern Present (08/20/2022)   [employer] of Occupational Health - Occupational Stress Questionnaire    Feeling of Stress : Not at all  Social Connections: Not on file  Intimate Partner Violence: Not on file     No Known Allergies   Outpatient Medications Prior to Visit  Medication Sig Dispense Refill   Budeson-Glycopyrrol-Formoterol (BREZTRI AEROSPHERE) 160-9-4.8 MCG/ACT AERO Inhale 2 puffs into the lungs in the morning and at bedtime. (Patient taking differently: Inhale 2 puffs into the lungs 2 (two) times daily as needed (Shortness of breath).) 10.7 g 6   Cholecalciferol (VITAMIN D3) 50 MCG (2000 UT) TABS Take 50 Units by mouth daily.     Ferrous Sulfate (IRON) 325 (65 FE) MG TABS Take 65 mg by mouth 2 (two) times daily.     furosemide (LASIX) 80 MG tablet Take 80 mg by mouth 2 (two) times  daily.     mercaptopurine (PURINETHOL) 50 MG tablet TAKE 1 AND 1/2 TABLETS DAILY BY MOUTH 135 tablet 1   Multiple Vitamins-Minerals (MENS MULTIVITAMIN PLUS PO) Take 1 tablet by mouth. Centrum Silver     vitamin B-12 (CYANOCOBALAMIN) 1000 MCG tablet Take 1,000 mcg by mouth daily.     furosemide (LASIX) 40 MG tablet Take 80 mg by mouth daily.     No facility-administered medications prior to visit.    Review of Systems  Constitutional:  Negative for chills, fever, malaise/fatigue and weight loss.  HENT:  Negative for congestion, sinus pain and sore throat.   Eyes: Negative.   Respiratory:  Negative for cough, hemoptysis, sputum production, shortness of breath and  wheezing.   Cardiovascular:  Negative for chest pain, palpitations, orthopnea, claudication and leg swelling.  Gastrointestinal:  Positive for heartburn. Negative for abdominal pain, nausea and vomiting.  Genitourinary: Negative.   Musculoskeletal:  Negative for joint pain and myalgias.  Skin:  Negative for rash.  Neurological:  Negative for weakness.  Endo/Heme/Allergies: Negative.   Psychiatric/Behavioral: Negative.      Objective:   Vitals:   01/10/23 1319  BP: (!) 140/80  Pulse: 71  Temp: 98.3 F (36.8 C)  TempSrc: Oral  SpO2: 92%  Weight: 163 lb 3.2 oz (74 kg)  Height: 5\' 11"  (1.803 m)     Physical Exam Constitutional:      General: He is not in acute distress.    Appearance: Normal appearance.  HENT:     Head: Normocephalic and atraumatic.  Eyes:     Conjunctiva/sclera: Conjunctivae normal.  Cardiovascular:     Rate and Rhythm: Normal rate and regular rhythm.     Pulses: Normal pulses.     Heart sounds: Normal heart sounds. No murmur heard. Pulmonary:     Effort: Pulmonary effort is normal.     Breath sounds: Decreased breath sounds present. No wheezing, rhonchi or rales.  Musculoskeletal:     Right lower leg: Edema present.     Left lower leg: Edema present.  Skin:    General: Skin is warm and  dry.  Neurological:     General: No focal deficit present.     Mental Status: He is alert.    CBC    Component Value Date/Time   WBC 7.4 10/08/2022 1111   RBC 3.68 (L) 10/08/2022 1111   HGB 13.2 10/08/2022 1111   HCT 38.9 (L) 10/08/2022 1111   PLT 263.0 10/08/2022 1111   MCV 105.7 (H) 10/08/2022 1111   MCH 35.3 (H) 11/17/2020 0241   MCHC 34.0 10/08/2022 1111   RDW 17.7 (H) 10/08/2022 1111   LYMPHSABS 1.3 10/08/2022 1111   MONOABS 0.7 10/08/2022 1111   EOSABS 0.1 10/08/2022 1111   BASOSABS 0.1 10/08/2022 1111      Latest Ref Rng & Units 11/13/2022    1:50 PM 05/04/2022    9:38 AM 04/19/2021   10:03 AM  BMP  Glucose 70 - 99 mg/dL 84  85  034   BUN 6 - 23 mg/dL 49  34  42   Creatinine 0.40 - 1.50 mg/dL 7.42  5.95  6.38   Sodium 135 - 145 mEq/L 140  143  141   Potassium 3.5 - 5.1 mEq/L 3.8  4.1  3.8   Chloride 96 - 112 mEq/L 96  102  103   CO2 19 - 32 mEq/L 33  35  28   Calcium 8.4 - 10.5 mg/dL 9.4  9.2  75.6    Chest imaging: CXR 11/15/20 COPD with pulmonary hyperinflation and emphysema. Mild scarring in the left lung base.  PFT:    Latest Ref Rng & Units 04/23/2014   10:33 AM  PFT Results  FVC-Pre L 2.86   FVC-Predicted Pre % 67   FVC-Post L 3.09   FVC-Predicted Post % 73   Pre FEV1/FVC % % 41   Post FEV1/FCV % % 42   FEV1-Pre L 1.18   FEV1-Predicted Pre % 37   FEV1-Post L 1.29   DLCO uncorrected ml/min/mmHg 11.76   DLCO UNC% % 38   DLVA Predicted % 47   TLC L 7.66   TLC % Predicted % 113   RV %  Predicted % 170   PFT 2015: Severe obstructive defect, air trapping present severe diffusion defect.  Echo 11/15/2020: LVEF 55 to 60%.  LV systolic function is normal.  Grade 1 diastolic dysfunction present.  RV systolic function is normal.  RV size is normal.  Mildly elevated PASP at 43 mmHg.  Trivial mitral valve regurgitation.  Mild to moderate aortic valve sclerosis/calcification.  Assessment & Plan:   Centrilobular emphysema (HCC)  Chronic hypoxemic  respiratory failure (HCC) - Plan: Ambulatory Referral for DME  Nocturnal hypoxemia  Discussion: Sean Pope is a 77 year old male, former smoker with severe COPD/Emphysema who returns to pulmonary clinic for follow up.   He is to continue breztri 2 puffs twice daily and as needed albuterol.   Use 2L pulsed O2 with ambulation via POC and start 2L continuous oxygen via home concentrator at night with sleep based on ONO results.   Follow-up in 6 months.  Melody Comas, MD [facility] Office: [phone]    Current Outpatient Medications:    Budeson-Glycopyrrol-Formoterol (BREZTRI AEROSPHERE) 160-9-4.8 MCG/ACT AERO, Inhale 2 puffs into the lungs in the morning and at bedtime. (Patient taking differently: Inhale 2 puffs into the lungs 2 (two) times daily as needed (Shortness of breath).), Disp: 10.7 g, Rfl: 6   Cholecalciferol (VITAMIN D3) 50 MCG (2000 UT) TABS, Take 50 Units by mouth daily., Disp: , Rfl:    Ferrous Sulfate (IRON) 325 (65 FE) MG TABS, Take 65 mg by mouth 2 (two) times daily., Disp: , Rfl:    furosemide (LASIX) 80 MG tablet, Take 80 mg by mouth 2 (two) times daily., Disp: , Rfl:    mercaptopurine (PURINETHOL) 50 MG tablet, TAKE 1 AND 1/2 TABLETS DAILY BY MOUTH, Disp: 135 tablet, Rfl: 1   Multiple Vitamins-Minerals (MENS MULTIVITAMIN PLUS PO), Take 1 tablet by mouth. Centrum Silver, Disp: , Rfl:    vitamin B-12 (CYANOCOBALAMIN) 1000 MCG tablet, Take 1,000 mcg by mouth daily., Disp: , Rfl:

## 2023-01-15 ENCOUNTER — Ambulatory Visit: Payer: PPO | Admitting: Pulmonary Disease

## 2023-01-21 ENCOUNTER — Telehealth: Payer: Self-pay | Admitting: Internal Medicine

## 2023-01-21 ENCOUNTER — Other Ambulatory Visit: Payer: Self-pay | Admitting: Internal Medicine

## 2023-01-21 ENCOUNTER — Encounter: Payer: Self-pay | Admitting: Pulmonary Disease

## 2023-01-21 ENCOUNTER — Other Ambulatory Visit (INDEPENDENT_AMBULATORY_CARE_PROVIDER_SITE_OTHER): Payer: PPO

## 2023-01-21 DIAGNOSIS — K5100 Ulcerative (chronic) pancolitis without complications: Secondary | ICD-10-CM

## 2023-01-21 DIAGNOSIS — Z7960 Long term (current) use of unspecified immunomodulators and immunosuppressants: Secondary | ICD-10-CM

## 2023-01-21 LAB — HEPATIC FUNCTION PANEL
ALT: 8 U/L (ref 0–53)
AST: 13 U/L (ref 0–37)
Albumin: 4 g/dL (ref 3.5–5.2)
Alkaline Phosphatase: 75 U/L (ref 39–117)
Bilirubin, Direct: 0.1 mg/dL (ref 0.0–0.3)
Total Bilirubin: 0.6 mg/dL (ref 0.2–1.2)
Total Protein: 6.5 g/dL (ref 6.0–8.3)

## 2023-01-21 LAB — CBC
HCT: 34.7 % — ABNORMAL LOW (ref 39.0–52.0)
Hemoglobin: 11.4 g/dL — ABNORMAL LOW (ref 13.0–17.0)
MCHC: 33 g/dL (ref 30.0–36.0)
MCV: 107.2 fl — ABNORMAL HIGH (ref 78.0–100.0)
Platelets: 248 10*3/uL (ref 150.0–400.0)
RBC: 3.23 Mil/uL — ABNORMAL LOW (ref 4.22–5.81)
RDW: 18.1 % — ABNORMAL HIGH (ref 11.5–15.5)
WBC: 6.2 10*3/uL (ref 4.0–10.5)

## 2023-01-21 NOTE — Telephone Encounter (Signed)
Spoke with Pt: Documented in result notes: Pt verbalized understanding with all questions answered.   

## 2023-01-21 NOTE — Telephone Encounter (Signed)
PT returning call to discuss lab results. Please advise. 

## 2023-01-24 DIAGNOSIS — L820 Inflamed seborrheic keratosis: Secondary | ICD-10-CM | POA: Diagnosis not present

## 2023-01-24 DIAGNOSIS — L821 Other seborrheic keratosis: Secondary | ICD-10-CM | POA: Diagnosis not present

## 2023-01-24 DIAGNOSIS — L570 Actinic keratosis: Secondary | ICD-10-CM | POA: Diagnosis not present

## 2023-02-03 DIAGNOSIS — J9611 Chronic respiratory failure with hypoxia: Secondary | ICD-10-CM | POA: Diagnosis not present

## 2023-02-06 ENCOUNTER — Other Ambulatory Visit: Payer: Self-pay | Admitting: Family Medicine

## 2023-02-06 DIAGNOSIS — J432 Centrilobular emphysema: Secondary | ICD-10-CM

## 2023-02-15 ENCOUNTER — Telehealth: Payer: Self-pay | Admitting: Family Medicine

## 2023-02-15 NOTE — Telephone Encounter (Signed)
Pt would like a call from Dr Doreene Burke. He wants to discuss his sciatica nerve pain.

## 2023-02-19 ENCOUNTER — Ambulatory Visit (INDEPENDENT_AMBULATORY_CARE_PROVIDER_SITE_OTHER): Payer: PPO | Admitting: Family Medicine

## 2023-02-19 ENCOUNTER — Encounter: Payer: Self-pay | Admitting: Family Medicine

## 2023-02-19 VITALS — BP 124/86 | HR 59 | Temp 98.5°F | Ht 71.0 in | Wt 164.0 lb

## 2023-02-19 DIAGNOSIS — Z23 Encounter for immunization: Secondary | ICD-10-CM

## 2023-02-19 DIAGNOSIS — M5431 Sciatica, right side: Secondary | ICD-10-CM

## 2023-02-19 MED ORDER — PREDNISONE 10 MG PO TABS: 10.0000 mg | ORAL_TABLET | Freq: Two times a day (BID) | ORAL | 0 refills | Status: AC

## 2023-02-19 MED ORDER — GABAPENTIN 100 MG PO CAPS
ORAL_CAPSULE | ORAL | 0 refills | Status: DC
Start: 2023-02-19 — End: 2023-08-10

## 2023-02-19 NOTE — Progress Notes (Signed)
Established Patient Office Visit   Subjective:  Patient ID: Sean Pope, male    DOB: 01-05-1946  Age: 77 y.o. MRN: [id number]  Chief Complaint  Patient presents with   Hip Pain    Right sciatic nerve pain x 2-3 weeks. Sharp shooting pains    Hip Pain  Pertinent negatives include no tingling.   Encounter Diagnoses  Name Primary?   Need for immunization against influenza Yes   Sciatica of right side    2 to 3-week history of pain in his right buttock area that radiates down the side of his right leg to his knee.  Denies saddle paresthesias or bowel or bladder incontinence.  Lower back is doing okay.  It is painful for him to walk.  He is accompanied by his daughter, Gearldine Bienenstock today.   Review of Systems  Constitutional: Negative.   HENT: Negative.    Eyes:  Negative for blurred vision, discharge and redness.  Respiratory: Negative.    Cardiovascular: Negative.   Gastrointestinal:  Negative for abdominal pain.  Genitourinary: Negative.   Musculoskeletal: Negative.  Negative for back pain and myalgias.  Skin:  Negative for rash.  Neurological:  Negative for tingling, loss of consciousness and weakness.  Endo/Heme/Allergies:  Negative for polydipsia.     Current Outpatient Medications:    BREZTRI AEROSPHERE 160-9-4.8 MCG/ACT AERO, INHALE 2 PUFFS INTO THE LUNGS IN THE MORNING AND AT BEDTIME., Disp: 10.7 each, Rfl: 6   Cholecalciferol (VITAMIN D3) 50 MCG (2000 UT) TABS, Take 50 Units by mouth daily., Disp: , Rfl:    Ferrous Sulfate (IRON) 325 (65 FE) MG TABS, Take 65 mg by mouth 2 (two) times daily., Disp: , Rfl:    furosemide (LASIX) 80 MG tablet, Take 80 mg by mouth 2 (two) times daily., Disp: , Rfl:    gabapentin (NEURONTIN) 100 MG capsule, Take 1 each morning and again in the mid afternoon and then 2 at night., Disp: 80 capsule, Rfl: 0   mercaptopurine (PURINETHOL) 50 MG tablet, TAKE 1 AND 1/2 TABLETS DAILY BY MOUTH, Disp: 135 tablet, Rfl: 1   Multiple Vitamins-Minerals  (MENS MULTIVITAMIN PLUS PO), Take 1 tablet by mouth. Centrum Silver, Disp: , Rfl:    predniSONE (DELTASONE) 10 MG tablet, Take 1 tablet (10 mg total) by mouth 2 (two) times daily with a meal for 7 days., Disp: 14 tablet, Rfl: 0   vitamin B-12 (CYANOCOBALAMIN) 1000 MCG tablet, Take 1,000 mcg by mouth daily., Disp: , Rfl:    Objective:     BP 124/86   Pulse (!) 59   Temp 98.5 F (36.9 C)   Ht 5\' 11"  (1.803 m)   Wt 164 lb (74.4 kg)   SpO2 92% Comment: ot currently using oxygen  BMI 22.87 kg/m    Physical Exam Constitutional:      General: He is not in acute distress.    Appearance: Normal appearance. He is not ill-appearing, toxic-appearing or diaphoretic.  HENT:     Head: Normocephalic and atraumatic.     Right Ear: External ear normal.     Left Ear: External ear normal.  Eyes:     General: No scleral icterus.       Right eye: No discharge.        Left eye: No discharge.     Extraocular Movements: Extraocular movements intact.     Conjunctiva/sclera: Conjunctivae normal.  Cardiovascular:     Rate and Rhythm: Normal rate and regular rhythm.  Pulmonary:  Effort: Pulmonary effort is normal. No respiratory distress.     Breath sounds: Normal breath sounds. Decreased air movement present.  Musculoskeletal:     Cervical back: No rigidity or tenderness.     Lumbar back: No bony tenderness. Negative right straight leg raise test and negative left straight leg raise test.     Right hip: Normal range of motion.     Left hip: Normal range of motion.       Legs:  Skin:    General: Skin is warm and dry.  Neurological:     Mental Status: He is alert and oriented to person, place, and time.  Psychiatric:        Mood and Affect: Mood normal.        Behavior: Behavior normal.      No results found for any visits on 02/19/23.    The ASCVD Risk score (Arnett DK, et al., 2019) failed to calculate for the following reasons:   The patient has a prior MI or stroke diagnosis     Assessment & Plan:   Need for immunization against influenza -     Flu vaccine HIGH DOSE PF  Sciatica of right side -     Gabapentin; Take 1 each morning and again in the mid afternoon and then 2 at night.  Dispense: 80 capsule; Refill: 0 -     predniSONE; Take 1 tablet (10 mg total) by mouth 2 (two) times daily with a meal for 7 days.  Dispense: 14 tablet; Refill: 0    Return if symptoms worsen or fail to improve.  Prednisone 10 mg twice daily for 7 days.  Will push Neurontin dosing secondary to current pain.  Somnolence warnings given.  Demonstrated sciatic glide stretches.  Information was given on sciatic rehab.  Return in 2 weeks if not improving.   Mliss Sax, MD

## 2023-02-23 ENCOUNTER — Other Ambulatory Visit: Payer: Self-pay

## 2023-02-23 ENCOUNTER — Observation Stay (HOSPITAL_COMMUNITY)
Admission: EM | Admit: 2023-02-23 | Discharge: 2023-02-24 | Disposition: A | Discharge status: Home / Self Care | Payer: PPO | Attending: Internal Medicine | Admitting: Internal Medicine

## 2023-02-23 ENCOUNTER — Emergency Department (HOSPITAL_COMMUNITY): Payer: PPO

## 2023-02-23 ENCOUNTER — Encounter (HOSPITAL_COMMUNITY): Payer: Self-pay

## 2023-02-23 DIAGNOSIS — J432 Centrilobular emphysema: Secondary | ICD-10-CM

## 2023-02-23 DIAGNOSIS — J449 Chronic obstructive pulmonary disease, unspecified: Secondary | ICD-10-CM | POA: Diagnosis not present

## 2023-02-23 DIAGNOSIS — M5431 Sciatica, right side: Secondary | ICD-10-CM | POA: Diagnosis not present

## 2023-02-23 DIAGNOSIS — Z8673 Personal history of transient ischemic attack (TIA), and cerebral infarction without residual deficits: Secondary | ICD-10-CM | POA: Diagnosis not present

## 2023-02-23 DIAGNOSIS — Z79899 Other long term (current) drug therapy: Secondary | ICD-10-CM | POA: Insufficient documentation

## 2023-02-23 DIAGNOSIS — I6782 Cerebral ischemia: Secondary | ICD-10-CM | POA: Insufficient documentation

## 2023-02-23 DIAGNOSIS — D631 Anemia in chronic kidney disease: Secondary | ICD-10-CM | POA: Insufficient documentation

## 2023-02-23 DIAGNOSIS — I129 Hypertensive chronic kidney disease with stage 1 through stage 4 chronic kidney disease, or unspecified chronic kidney disease: Secondary | ICD-10-CM | POA: Insufficient documentation

## 2023-02-23 DIAGNOSIS — Z8616 Personal history of COVID-19: Secondary | ICD-10-CM | POA: Diagnosis not present

## 2023-02-23 DIAGNOSIS — Z87891 Personal history of nicotine dependence: Secondary | ICD-10-CM | POA: Diagnosis not present

## 2023-02-23 DIAGNOSIS — I10 Essential (primary) hypertension: Secondary | ICD-10-CM | POA: Diagnosis not present

## 2023-02-23 DIAGNOSIS — G459 Transient cerebral ischemic attack, unspecified: Principal | ICD-10-CM | POA: Diagnosis present

## 2023-02-23 DIAGNOSIS — K5100 Ulcerative (chronic) pancolitis without complications: Secondary | ICD-10-CM | POA: Diagnosis not present

## 2023-02-23 DIAGNOSIS — N184 Chronic kidney disease, stage 4 (severe): Secondary | ICD-10-CM | POA: Diagnosis present

## 2023-02-23 DIAGNOSIS — J439 Emphysema, unspecified: Secondary | ICD-10-CM | POA: Diagnosis present

## 2023-02-23 DIAGNOSIS — R29810 Facial weakness: Principal | ICD-10-CM | POA: Insufficient documentation

## 2023-02-23 LAB — I-STAT CHEM 8, ED
BUN: 57 mg/dL — ABNORMAL HIGH (ref 8–23)
Calcium, Ion: 1.19 mmol/L (ref 1.15–1.40)
Chloride: 100 mmol/L (ref 98–111)
Creatinine, Ser: 2.8 mg/dL — ABNORMAL HIGH (ref 0.61–1.24)
Glucose, Bld: 98 mg/dL (ref 70–99)
HCT: 35 % — ABNORMAL LOW (ref 39.0–52.0)
Hemoglobin: 11.9 g/dL — ABNORMAL LOW (ref 13.0–17.0)
Potassium: 3.9 mmol/L (ref 3.5–5.1)
Sodium: 141 mmol/L (ref 135–145)
TCO2: 30 mmol/L (ref 22–32)

## 2023-02-23 LAB — PROTIME-INR
INR: 0.9 (ref 0.8–1.2)
Prothrombin Time: 12.8 s (ref 11.4–15.2)

## 2023-02-23 LAB — COMPREHENSIVE METABOLIC PANEL
ALT: 11 U/L (ref 0–44)
AST: 15 U/L (ref 15–41)
Albumin: 3.6 g/dL (ref 3.5–5.0)
Alkaline Phosphatase: 87 U/L (ref 38–126)
Anion gap: 11 (ref 5–15)
BUN: 62 mg/dL — ABNORMAL HIGH (ref 8–23)
CO2: 29 mmol/L (ref 22–32)
Calcium: 9.3 mg/dL (ref 8.9–10.3)
Chloride: 100 mmol/L (ref 98–111)
Creatinine, Ser: 2.75 mg/dL — ABNORMAL HIGH (ref 0.61–1.24)
GFR, Estimated: 23 mL/min — ABNORMAL LOW (ref >60)
Glucose, Bld: 94 mg/dL (ref 70–99)
Potassium: 3.8 mmol/L (ref 3.5–5.1)
Sodium: 140 mmol/L (ref 135–145)
Total Bilirubin: 0.7 mg/dL (ref 0.3–1.2)
Total Protein: 6.4 g/dL — ABNORMAL LOW (ref 6.5–8.1)

## 2023-02-23 LAB — ETHANOL: Alcohol, Ethyl (B): <10 mg/dL (ref <10)

## 2023-02-23 LAB — CBC
HCT: 35 % — ABNORMAL LOW (ref 39.0–52.0)
Hemoglobin: 11.2 g/dL — ABNORMAL LOW (ref 13.0–17.0)
MCH: 34.5 pg — ABNORMAL HIGH (ref 26.0–34.0)
MCHC: 32 g/dL (ref 30.0–36.0)
MCV: 107.7 fL — ABNORMAL HIGH (ref 80.0–100.0)
Platelets: 286 10*3/uL (ref 150–400)
RBC: 3.25 MIL/uL — ABNORMAL LOW (ref 4.22–5.81)
RDW: 16.5 % — ABNORMAL HIGH (ref 11.5–15.5)
WBC: 10.2 10*3/uL (ref 4.0–10.5)
nRBC: 0.3 % — ABNORMAL HIGH (ref 0.0–0.2)

## 2023-02-23 LAB — DIFFERENTIAL
Abs Immature Granulocytes: 0.08 10*3/uL — ABNORMAL HIGH (ref 0.00–0.07)
Basophils Absolute: 0 10*3/uL (ref 0.0–0.1)
Basophils Relative: 0 %
Eosinophils Absolute: 0.1 10*3/uL (ref 0.0–0.5)
Eosinophils Relative: 1 %
Immature Granulocytes: 1 %
Lymphocytes Relative: 10 %
Lymphs Abs: 1.1 10*3/uL (ref 0.7–4.0)
Monocytes Absolute: 0.5 10*3/uL (ref 0.1–1.0)
Monocytes Relative: 5 %
Neutro Abs: 8.4 10*3/uL — ABNORMAL HIGH (ref 1.7–7.7)
Neutrophils Relative %: 83 %

## 2023-02-23 LAB — APTT: aPTT: 27 s (ref 24–36)

## 2023-02-23 MED ORDER — UMECLIDINIUM BROMIDE 62.5 MCG/ACT IN AEPB
1.0000 | INHALATION_SPRAY | Freq: Every day | RESPIRATORY_TRACT | Status: DC
  Filled 2023-02-23: qty 7

## 2023-02-23 MED ORDER — GABAPENTIN 100 MG PO CAPS: 100.0000 mg | ORAL_CAPSULE | ORAL | Status: DC

## 2023-02-23 MED ORDER — SENNOSIDES-DOCUSATE SODIUM 8.6-50 MG PO TABS: 1.0000 | ORAL_TABLET | Freq: Every evening | ORAL | Status: DC | PRN

## 2023-02-23 MED ORDER — MERCAPTOPURINE 50 MG PO TABS: 75.0000 mg | ORAL_TABLET | Freq: Every day | ORAL | Status: DC

## 2023-02-23 MED ORDER — CLOPIDOGREL BISULFATE 300 MG PO TABS
300.0000 mg | ORAL_TABLET | Freq: Once | ORAL | Status: AC
  Administered 2023-02-23: 300 mg via ORAL
  Filled 2023-02-23: qty 1

## 2023-02-23 MED ORDER — ACETAMINOPHEN 650 MG RE SUPP: 650.0000 mg | RECTAL | Status: DC | PRN

## 2023-02-23 MED ORDER — ACETAMINOPHEN 325 MG PO TABS: 650.0000 mg | ORAL_TABLET | ORAL | Status: DC | PRN

## 2023-02-23 MED ORDER — BUDESON-GLYCOPYRROL-FORMOTEROL 160-9-4.8 MCG/ACT IN AERO: 2.0000 | INHALATION_SPRAY | Freq: Two times a day (BID) | RESPIRATORY_TRACT | Status: DC

## 2023-02-23 MED ORDER — MERCAPTOPURINE 50 MG PO TABS
75.0000 mg | ORAL_TABLET | ORAL | Status: DC
  Administered 2023-02-24: 75 mg via ORAL
  Filled 2023-02-23 (×3): qty 2

## 2023-02-23 MED ORDER — ACETAMINOPHEN 160 MG/5ML PO SOLN: 650.0000 mg | ORAL | Status: DC | PRN

## 2023-02-23 MED ORDER — SODIUM CHLORIDE 0.9% FLUSH: 3.0000 mL | INTRAVENOUS | Status: DC | PRN

## 2023-02-23 MED ORDER — GABAPENTIN 100 MG PO CAPS
100.0000 mg | ORAL_CAPSULE | Freq: Two times a day (BID) | ORAL | Status: DC
  Administered 2023-02-24: 100 mg via ORAL
  Filled 2023-02-23: qty 1

## 2023-02-23 MED ORDER — FUROSEMIDE 20 MG PO TABS
80.0000 mg | ORAL_TABLET | Freq: Two times a day (BID) | ORAL | Status: DC
  Administered 2023-02-23 – 2023-02-24 (×2): 80 mg via ORAL
  Filled 2023-02-23 (×2): qty 4

## 2023-02-23 MED ORDER — FLUTICASONE FUROATE-VILANTEROL 200-25 MCG/ACT IN AEPB
1.0000 | INHALATION_SPRAY | Freq: Every day | RESPIRATORY_TRACT | Status: DC
  Filled 2023-02-23: qty 28

## 2023-02-23 MED ORDER — HEPARIN SODIUM (PORCINE) 5000 UNIT/ML IJ SOLN
5000.0000 [IU] | Freq: Three times a day (TID) | INTRAMUSCULAR | Status: DC
  Administered 2023-02-23 – 2023-02-24 (×2): 5000 [IU] via SUBCUTANEOUS
  Filled 2023-02-23 (×2): qty 1

## 2023-02-23 MED ORDER — ASPIRIN 81 MG PO TBEC
81.0000 mg | DELAYED_RELEASE_TABLET | Freq: Every day | ORAL | Status: DC
  Administered 2023-02-23 – 2023-02-24 (×2): 81 mg via ORAL
  Filled 2023-02-23 (×2): qty 1

## 2023-02-23 MED ORDER — GABAPENTIN 100 MG PO CAPS
200.0000 mg | ORAL_CAPSULE | Freq: Every day | ORAL | Status: DC
  Administered 2023-02-23: 200 mg via ORAL
  Filled 2023-02-23: qty 2

## 2023-02-23 MED ORDER — CLOPIDOGREL BISULFATE 75 MG PO TABS
75.0000 mg | ORAL_TABLET | Freq: Every day | ORAL | Status: DC
  Administered 2023-02-24: 75 mg via ORAL
  Filled 2023-02-23: qty 1

## 2023-02-23 MED ORDER — SODIUM CHLORIDE 0.9 % IV SOLN: 250.0000 mL | INTRAVENOUS | Status: DC | PRN

## 2023-02-23 MED ORDER — SODIUM CHLORIDE 0.9% FLUSH: 3.0000 mL | Freq: Once | INTRAVENOUS | Status: DC

## 2023-02-23 MED ORDER — STROKE: EARLY STAGES OF RECOVERY BOOK
Freq: Once | Status: AC
  Filled 2023-02-23: qty 1

## 2023-02-23 MED ORDER — SODIUM CHLORIDE 0.9% FLUSH
3.0000 mL | Freq: Two times a day (BID) | INTRAVENOUS | Status: DC
  Administered 2023-02-24: 3 mL via INTRAVENOUS

## 2023-02-23 NOTE — ED Notes (Signed)
Patient transported to MRI 

## 2023-02-23 NOTE — ED Provider Notes (Signed)
[place] [facility]ERGENCY DEPARTMENT AT [facility] Provider Note  CSN: [id number] Arrival date & time: 02/23/23 1229  Chief Complaint(s) Transient Ischemic Attack  HPI Sean Pope is a 77 y.o. male with PMH COPD, subarachnoid hemorrhage status post coiling in 2013 and bur hole, ulcerative colitis, CKD 3 who presents emergency department for evaluation of a suspected TIA.  Patient states that he was recently prescribed gabapentin and slept later than usual.  He awoke at 11 AM this morning and had a 15-minute episode of sudden onset left-sided facial numbness and weakness where he attempted to drink coffee and it spilled out of his mouth.  The symptoms have since completely resolved.  No associated upper or lower extremity numbness, tingling, weakness.   Past Medical History Past Medical History:  Diagnosis Date   Acute respiratory failure with hypoxia (HCC) 09/17/2011   Acute respiratory failure with hypoxia and hypercapnia (HCC) 11/15/2020   AKI (acute kidney injury) (HCC)    In the setting of ulcerative colitis flare, question relationship to mesalamine   Anal fissure - posterior 01/22/2014   Anemia-multifactorial 01/05/2014   Blood loss, medications, chronic illness kidney injury   ARF (acute renal failure) (HCC)    B12 deficiency - borderline 12/21/2014   C. difficile colitis 06/02/2014   Chronic kidney disease, stage III (moderate) (HCC) 09/08/2013   Emphysema lung (HCC)    GERD (gastroesophageal reflux disease)    Hypercalcemia    Hyperlipidemia    Hypertension    Latent tuberculosis    Long-term use of immunosuppressant medication-mercaptopurine 01/05/2014   Lung collapse 09/26/2011   Osteoporosis 06/29/2014   DEXA 06/2014 - lowest t score -4.2   Pneumonia due to COVID-19 virus 11/15/2020   Posterior communicating artery aneurysm 09/17/2011   Protein-calorie malnutrition, severe (HCC) 10/26/2013   Respiratory failure, acute (HCC) 01/07/2014   SAH (subarachnoid  hemorrhage) (HCC)    Stroke (HCC)    Ulcerative colitis (HCC) 05/28/2013   Vitamin D deficiency 06/03/2014   Patient Active Problem List   Diagnosis Date Noted   Chest congestion 08/06/2022   Grieving 05/04/2022   On home oxygen therapy as needed 05/03/2021   Elevated troponin 11/15/2020   B12 deficiency - borderline 12/21/2014   Osteoporosis 06/29/2014   Vitamin D deficiency 06/03/2014   History of systemic steroid therapy 06/02/2014   COPD with emphysema  GOLD III 01/20/2014   Pulmonary infiltrates 01/19/2014   Swelling of both lower extremities 01/19/2014   Long-term use of immunosuppressant medication-mercaptopurine  01/05/2014   Anemia-multifactorial 01/05/2014   GERD (gastroesophageal reflux disease) 10/26/2013   Chronic kidney disease, stage III (moderate) (HCC) 09/08/2013   Ulcerative chronic pancolitis (HCC) 05/28/2013   Home Medication(s) Prior to Admission medications   Medication Sig Start Date End Date Taking? Authorizing Provider  BREZTRI AEROSPHERE 160-9-4.8 MCG/ACT AERO INHALE 2 PUFFS INTO THE LUNGS IN THE MORNING AND AT BEDTIME. 02/06/23   Mliss Sax, MD  Cholecalciferol (VITAMIN D3) 50 MCG (2000 UT) TABS Take 50 Units by mouth daily.    [provider]  Ferrous Sulfate (IRON) 325 (65 FE) MG TABS Take 65 mg by mouth 2 (two) times daily.    [provider]  furosemide (LASIX) 80 MG tablet Take 80 mg by mouth 2 (two) times daily. 01/07/23   [provider]  gabapentin (NEURONTIN) 100 MG capsule Take 1 each morning and again in the mid afternoon and then 2 at night. 02/19/23   Mliss Sax, MD  mercaptopurine (PURINETHOL) 50  MG tablet TAKE 1 AND 1/2 TABLETS DAILY BY MOUTH 11/26/22   Iva Boop, MD  Multiple Vitamins-Minerals (MENS MULTIVITAMIN PLUS PO) Take 1 tablet by mouth. Centrum Silver    [provider]  predniSONE (DELTASONE) 10 MG tablet Take 1 tablet (10 mg total) by mouth 2 (two) times daily with a  meal for 7 days. 02/19/23 02/26/23  Mliss Sax, MD  vitamin B-12 (CYANOCOBALAMIN) 1000 MCG tablet Take 1,000 mcg by mouth daily.    [provider]                                                                                                                                    Past Surgical History Past Surgical History:  Procedure Laterality Date   ANEURYSM COILING  2013   Posterior communicating   BIOPSY  11/06/2022   Procedure: BIOPSY;  Surgeon: Iva Boop, MD;  Location: [facility];  Service: Gastroenterology;;   CATARACT EXTRACTION Bilateral aug, sept 2016   COLONOSCOPY W/ BIOPSIES  05/28/2013   COLONOSCOPY WITH PROPOFOL N/A 11/06/2022   Procedure: COLONOSCOPY WITH PROPOFOL;  Surgeon: Iva Boop, MD;  Location: [facility];  Service: Gastroenterology;  Laterality: N/A;   IR ANGIO INTRA EXTRACRAN SEL INTERNAL CAROTID BILAT MOD SED  10/22/2017   IR ANGIO VERTEBRAL SEL VERTEBRAL UNI L MOD SED  10/22/2017   Family History Family History  Problem Relation Age of Onset   Heart disease Mother     Social History Social History   Tobacco Use   Smoking status: Former    Current packs/day: 0.00    Average packs/day: 0.5 packs/day for 50.0 years (25.0 ttl pk-yrs)    Types: Cigarettes    Start date: 06/18/1961    Quit date: 06/19/2011    Years since quitting: 11.6   Smokeless tobacco: Never  Vaping Use   Vaping status: Never Used  Substance Use Topics   Alcohol use: Yes    Alcohol/week: 0.0 standard drinks of alcohol    Comment: rare   Drug use: No   Allergies Patient has no known allergies.  Review of Systems Review of Systems  Neurological:  Positive for facial asymmetry.    Physical Exam Vital Signs  I have reviewed the triage vital signs BP 127/85 (BP Location: Right Arm)   Pulse (!) 57   Temp 98.3 F (36.8 C) (Oral)   Resp 18   Ht 5\' 10"  (1.778 m)   Wt 73.5 kg   SpO2 95%   BMI 23.24 kg/m   Physical Exam Vitals and nursing note  reviewed.  Constitutional:      General: He is not in acute distress.    Appearance: Normal appearance. He is well-developed.  HENT:     Head: Normocephalic and atraumatic.     Nose: No congestion or rhinorrhea.  Eyes:     General:        Right eye:  No discharge.        Left eye: No discharge.     Extraocular Movements: Extraocular movements intact.     Conjunctiva/sclera: Conjunctivae normal.     Pupils: Pupils are equal, round, and reactive to light.  Cardiovascular:     Rate and Rhythm: Normal rate and regular rhythm.     Heart sounds: No murmur heard. Pulmonary:     Effort: Pulmonary effort is normal. No respiratory distress.     Breath sounds: No wheezing or rales.  Abdominal:     General: There is no distension.     Tenderness: There is no abdominal tenderness.  Musculoskeletal:        General: No swelling. Normal range of motion.     Cervical back: Normal range of motion and neck supple.  Skin:    General: Skin is warm and dry.  Neurological:     General: No focal deficit present.     Mental Status: He is alert.     Cranial Nerves: No cranial nerve deficit.     Sensory: No sensory deficit.     Motor: No weakness.  Psychiatric:        Mood and Affect: Mood normal.     ED Results and Treatments Labs (all labs ordered are listed, but only abnormal results are displayed) Labs Reviewed  CBC - Abnormal; Notable for the following components:      Result Value   RBC 3.25 (*)    Hemoglobin 11.2 (*)    HCT 35.0 (*)    MCV 107.7 (*)    MCH 34.5 (*)    RDW 16.5 (*)    nRBC 0.3 (*)    All other components within normal limits  DIFFERENTIAL - Abnormal; Notable for the following components:   Neutro Abs 8.4 (*)    Abs Immature Granulocytes 0.08 (*)    All other components within normal limits  I-STAT CHEM 8, ED - Abnormal; Notable for the following components:   BUN 57 (*)    Creatinine, Ser 2.80 (*)    Hemoglobin 11.9 (*)    HCT 35.0 (*)    All other components  within normal limits  PROTIME-INR  APTT  COMPREHENSIVE METABOLIC PANEL  ETHANOL  CBG MONITORING, ED                                                                                                                          Radiology No results found.  Pertinent labs & imaging results that were available during my care of the patient were reviewed by me and considered in my medical decision making (see MDM for details).  Medications Ordered in ED Medications  sodium chloride flush (NS) 0.9 % injection 3 mL (has no administration in time range)  Procedures Procedures  (including critical care time)  Medical Decision Making / ED Course   This patient presents to the ED for concern of facial weakness, this involves an extensive number of treatment options, and is a complaint that carries with it a high risk of complications and morbidity.  The differential diagnosis includes, TIA CVA, hemorrhagic stroke, mass, Todd's paralysis, seizure, electrolyte abnormality, encephalopathy  MDM: Patient seen emergency room for evaluation of facial weakness.  Physical exam currently unremarkable with no focal motor or sensory deficits.  No cranial nerve deficits.  Laboratory evaluation with an i-STAT creatinine of 2.80, hemoglobin 11.9.  Patient unable to receive CTA imaging due to his GFR and thus I ordered an MRI angio and MR without of the brain.  These are pending at time of signout.  Please see provider signout for continuation of workup.   Additional history obtained: -Additional history obtained from daughter -External records from outside source obtained and reviewed including: Chart review including previous notes, labs, imaging, consultation notes   Lab Tests: -I ordered, reviewed, and interpreted labs.   The pertinent results include:   Labs Reviewed  CBC -  Abnormal; Notable for the following components:      Result Value   RBC 3.25 (*)    Hemoglobin 11.2 (*)    HCT 35.0 (*)    MCV 107.7 (*)    MCH 34.5 (*)    RDW 16.5 (*)    nRBC 0.3 (*)    All other components within normal limits  DIFFERENTIAL - Abnormal; Notable for the following components:   Neutro Abs 8.4 (*)    Abs Immature Granulocytes 0.08 (*)    All other components within normal limits  I-STAT CHEM 8, ED - Abnormal; Notable for the following components:   BUN 57 (*)    Creatinine, Ser 2.80 (*)    Hemoglobin 11.9 (*)    HCT 35.0 (*)    All other components within normal limits  PROTIME-INR  APTT  COMPREHENSIVE METABOLIC PANEL  ETHANOL  CBG MONITORING, ED       Imaging Studies ordered: I ordered imaging studies including MRI brain, MR angio   Medicines ordered and prescription drug management: Meds ordered this encounter  Medications   sodium chloride flush (NS) 0.9 % injection 3 mL    -I have reviewed the patients home medicines and have made adjustments as needed  Critical interventions none    Cardiac Monitoring: The patient was maintained on a cardiac monitor.  I personally viewed and interpreted the cardiac monitored which showed an underlying rhythm of: NSR  Social Determinants of Health:  Factors impacting patients care include: none   Reevaluation: After the interventions noted above, I reevaluated the patient and found that they have :improved  Co morbidities that complicate the patient evaluation  Past Medical History:  Diagnosis Date   Acute respiratory failure with hypoxia (HCC) 09/17/2011   Acute respiratory failure with hypoxia and hypercapnia (HCC) 11/15/2020   AKI (acute kidney injury) (HCC)    In the setting of ulcerative colitis flare, question relationship to mesalamine   Anal fissure - posterior 01/22/2014   Anemia-multifactorial 01/05/2014   Blood loss, medications, chronic illness kidney injury   ARF (acute renal failure)  (HCC)    B12 deficiency - borderline 12/21/2014   C. difficile colitis 06/02/2014   Chronic kidney disease, stage III (moderate) (HCC) 09/08/2013   Emphysema lung (HCC)    GERD (gastroesophageal reflux disease)    Hypercalcemia  Hyperlipidemia    Hypertension    Latent tuberculosis    Long-term use of immunosuppressant medication-mercaptopurine 01/05/2014   Lung collapse 09/26/2011   Osteoporosis 06/29/2014   DEXA 06/2014 - lowest t score -4.2   Pneumonia due to COVID-19 virus 11/15/2020   Posterior communicating artery aneurysm 09/17/2011   Protein-calorie malnutrition, severe (HCC) 10/26/2013   Respiratory failure, acute (HCC) 01/07/2014   SAH (subarachnoid hemorrhage) (HCC)    Stroke (HCC)    Ulcerative colitis (HCC) 05/28/2013   Vitamin D deficiency 06/03/2014      Dispostion: I considered admission for this patient, and disposition pending completion of MR imaging.  Please see provider signout note for continuation of workup.     Final Clinical Impression(s) / ED Diagnoses Final diagnoses:  None     @PCDICTATION @    Glendora Score, MD 02/23/23 1836

## 2023-02-23 NOTE — H&P (Signed)
History and Physical    Sean Pope VHQ:[id number] DOB: February 23, 1946 DOA: 02/23/2023  PCP: Mliss Sax, MD   Patient coming from: Home   Chief Complaint: Left facial weakness   HPI: Sean Pope is a pleasant 77 y.o. male with medical history significant for intracranial aneurysm status post coiling in 2013, COPD, ulcerative colitis, and CKD stage IV who presents to the ED for evaluation of left facial weakness.  Patient reports going to bed in his usual state last night, felt well when he woke up but was unable to drink his morning coffee as it kept spilling out the left side of his mouth.  His daughter noted that he had a left-sided facial droop and called EMS.  Symptoms had resolved by time of EMS arrival.  Patient reports feeling back to his usual state and denies any focal numbness or weakness.  ED Course: Upon arrival to the ED, patient is found to be afebrile and saturating well on room air with stable blood pressure.  Labs are most notable for creatinine 2.75.  No acute findings are noted on MRI brain or MRA head.  Neurology was consulted by the ED physician and hospitalists asked to admit for TIA workup.  Review of Systems:  All other systems reviewed and apart from HPI, are negative.  Past Medical History:  Diagnosis Date   Acute respiratory failure with hypoxia (HCC) 09/17/2011   Acute respiratory failure with hypoxia and hypercapnia (HCC) 11/15/2020   AKI (acute kidney injury) (HCC)    In the setting of ulcerative colitis flare, question relationship to mesalamine   Anal fissure - posterior 01/22/2014   Anemia-multifactorial 01/05/2014   Blood loss, medications, chronic illness kidney injury   ARF (acute renal failure) (HCC)    B12 deficiency - borderline 12/21/2014   C. difficile colitis 06/02/2014   Chronic kidney disease, stage III (moderate) (HCC) 09/08/2013   Emphysema lung (HCC)    GERD (gastroesophageal reflux disease)    Hypercalcemia     Hyperlipidemia    Hypertension    Latent tuberculosis    Long-term use of immunosuppressant medication-mercaptopurine 01/05/2014   Lung collapse 09/26/2011   Osteoporosis 06/29/2014   DEXA 06/2014 - lowest t score -4.2   Pneumonia due to COVID-19 virus 11/15/2020   Posterior communicating artery aneurysm 09/17/2011   Protein-calorie malnutrition, severe (HCC) 10/26/2013   Respiratory failure, acute (HCC) 01/07/2014   SAH (subarachnoid hemorrhage) (HCC)    Stroke (HCC)    Ulcerative colitis (HCC) 05/28/2013   Vitamin D deficiency 06/03/2014    Past Surgical History:  Procedure Laterality Date   ANEURYSM COILING  2013   Posterior communicating   BIOPSY  11/06/2022   Procedure: BIOPSY;  Surgeon: Iva Boop, MD;  Location: [facility];  Service: Gastroenterology;;   CATARACT EXTRACTION Bilateral aug, sept 2016   COLONOSCOPY W/ BIOPSIES  05/28/2013   COLONOSCOPY WITH PROPOFOL N/A 11/06/2022   Procedure: COLONOSCOPY WITH PROPOFOL;  Surgeon: Iva Boop, MD;  Location: [facility];  Service: Gastroenterology;  Laterality: N/A;   IR ANGIO INTRA EXTRACRAN SEL INTERNAL CAROTID BILAT MOD SED  10/22/2017   IR ANGIO VERTEBRAL SEL VERTEBRAL UNI L MOD SED  10/22/2017    Social History:   reports that he quit smoking about 11 years ago. His smoking use included cigarettes. He started smoking about 61 years ago. He has a 25 pack-year smoking history. He has never used smokeless tobacco. He reports current alcohol use. He reports that he does not  use drugs.  No Known Allergies  Family History  Problem Relation Age of Onset   Heart disease Mother      Prior to Admission medications   Medication Sig Start Date End Date Taking? Authorizing Provider  BREZTRI AEROSPHERE 160-9-4.8 MCG/ACT AERO INHALE 2 PUFFS INTO THE LUNGS IN THE MORNING AND AT BEDTIME. 02/06/23   Mliss Sax, MD  Cholecalciferol (VITAMIN D3) 50 MCG (2000 UT) TABS Take 50 Units by mouth daily.    [provider]  Ferrous Sulfate (IRON) 325 (65 FE) MG TABS Take 65 mg by mouth 2 (two) times daily.    [provider]  furosemide (LASIX) 80 MG tablet Take 80 mg by mouth 2 (two) times daily. 01/07/23   [provider]  gabapentin (NEURONTIN) 100 MG capsule Take 1 each morning and again in the mid afternoon and then 2 at night. 02/19/23   Mliss Sax, MD  mercaptopurine (PURINETHOL) 50 MG tablet TAKE 1 AND 1/2 TABLETS DAILY BY MOUTH 11/26/22   Iva Boop, MD  Multiple Vitamins-Minerals (MENS MULTIVITAMIN PLUS PO) Take 1 tablet by mouth. Centrum Silver    [provider]  predniSONE (DELTASONE) 10 MG tablet Take 1 tablet (10 mg total) by mouth 2 (two) times daily with a meal for 7 days. 02/19/23 02/26/23  Mliss Sax, MD  vitamin B-12 (CYANOCOBALAMIN) 1000 MCG tablet Take 1,000 mcg by mouth daily.    [provider]    Physical Exam: Vitals:   02/23/23 1745 02/23/23 1838 02/23/23 1856 02/23/23 1900  BP: (!) 148/127  (!) 144/97 (!) 151/82  Pulse: 63  60 (!) 57  Resp: 14  15 16   Temp:  98.2 F (36.8 C)    TempSrc:  Oral    SpO2: 97%  99% 95%  Weight:      Height:         Constitutional: NAD, calm  Eyes: PERTLA, lids and conjunctivae normal ENMT: Mucous membranes are moist. Posterior pharynx clear of any exudate or lesions.   Neck: supple, no masses  Respiratory: no wheezing, no crackles. No accessory muscle use.  Cardiovascular: S1 & S2 heard, regular rate and rhythm. No extremity edema.   Abdomen: No distension, no tenderness, soft. Bowel sounds active.  Musculoskeletal: no clubbing / cyanosis. No joint deformity upper and lower extremities.   Skin: no significant rashes, lesions, ulcers. Warm, dry, well-perfused. Neurologic: CN 2-12 grossly intact. Sensation to light touch intact. Strength 5/5 in all 4 limbs. Alert and oriented.  Psychiatric: Pleasant. Cooperative.    Labs and Imaging on Admission: I have personally  reviewed following labs and imaging studies  CBC: Recent Labs  Lab 02/23/23 1257 02/23/23 1305  WBC 10.2  --   NEUTROABS 8.4*  --   HGB 11.2* 11.9*  HCT 35.0* 35.0*  MCV 107.7*  --   PLT 286  --    Basic Metabolic Panel: Recent Labs  Lab 02/23/23 1257 02/23/23 1305  NA 140 141  K 3.8 3.9  CL 100 100  CO2 29  --   GLUCOSE 94 98  BUN 62* 57*  CREATININE 2.75* 2.80*  CALCIUM 9.3  --    GFR: Estimated Creatinine Clearance: 22.8 mL/min (A) (by C-G formula based on SCr of 2.8 mg/dL (H)). Liver Function Tests: Recent Labs  Lab 02/23/23 1257  AST 15  ALT 11  ALKPHOS 87  BILITOT 0.7  PROT 6.4*  ALBUMIN 3.6   No results for input(s): "LIPASE", "AMYLASE" in  the last 168 hours. No results for input(s): "AMMONIA" in the last 168 hours. Coagulation Profile: Recent Labs  Lab 02/23/23 1257  INR 0.9   Cardiac Enzymes: No results for input(s): "CKTOTAL", "CKMB", "CKMBINDEX", "TROPONINI" in the last 168 hours. BNP (last 3 results) No results for input(s): "PROBNP" in the last 8760 hours. HbA1C: No results for input(s): "HGBA1C" in the last 72 hours. CBG: No results for input(s): "GLUCAP" in the last 168 hours. Lipid Profile: No results for input(s): "CHOL", "HDL", "LDLCALC", "TRIG", "CHOLHDL", "LDLDIRECT" in the last 72 hours. Thyroid Function Tests: No results for input(s): "TSH", "T4TOTAL", "FREET4", "T3FREE", "THYROIDAB" in the last 72 hours. Anemia Panel: No results for input(s): "VITAMINB12", "FOLATE", "FERRITIN", "TIBC", "IRON", "RETICCTPCT" in the last 72 hours. Urine analysis:    Component Value Date/Time   COLORURINE YELLOW 01/08/2014 0120   APPEARANCEUR CLOUDY (A) 01/08/2014 0120   LABSPEC 1.020 01/08/2014 0120   PHURINE 5.5 01/08/2014 0120   GLUCOSEU NEGATIVE 01/08/2014 0120   GLUCOSEU NEGATIVE 10/20/2013 0855   HGBUR NEGATIVE 01/08/2014 0120   BILIRUBINUR NEGATIVE 01/08/2014 0120   KETONESUR NEGATIVE 01/08/2014 0120   PROTEINUR NEGATIVE 01/08/2014  0120   UROBILINOGEN 0.2 01/08/2014 0120   NITRITE NEGATIVE 01/08/2014 0120   LEUKOCYTESUR NEGATIVE 01/08/2014 0120   Sepsis Labs: @LABRCNTIP (procalcitonin:4,lacticidven:4) )No results found for this or any previous visit (from the past 240 hour(s)).   Radiological Exams on Admission: MR BRAIN WO CONTRAST  Result Date: 02/23/2023 CLINICAL DATA:  Initial evaluation for TIA. EXAM: MRI HEAD WITHOUT CONTRAST MRA HEAD WITHOUT CONTRAST TECHNIQUE: Multiplanar, multi-echo pulse sequences of the brain and surrounding structures were acquired without intravenous contrast. Angiographic images of the Circle of Willis were acquired using MRA technique without intravenous contrast. COMPARISON:  Prior study from 04/24/2021 and earlier. FINDINGS: MRI HEAD FINDINGS Brain: Cerebral volume within normal limits. Chronic microvascular ischemic disease noted involving the periventricular and deep white matter both cerebral hemispheres and pons, moderately advanced. Few scattered remote infarcts involving the right occipital lobe and bilateral cerebellar hemispheres. Sequelae of prior right frontal ventriculostomy noted. No evidence for acute or subacute ischemia. No acute intracranial hemorrhage. Few scattered chronic micro hemorrhages noted within the right cerebral hemisphere and cerebellum. No mass lesion, midline shift or mass effect. No hydrocephalus or extra-axial fluid collection. Pituitary gland suprasellar region within normal limits. Vascular: Major intracranial vascular flow voids are maintained. Skull and upper cervical spine: Craniocervical junction normal. Bone marrow signal intensity normal. No scalp soft tissue abnormality. Sinuses/Orbits: Prior bilateral ocular lens replacement. Paranasal sinuses and mastoid air cells are largely clear. Other: None. MRA HEAD FINDINGS Anterior circulation: Both internal carotid arteries are patent through the siphons to the termini without stenosis. Sequelae of prior coil  embolization of right PCOM aneurysm again noted. Residual 4 mm irregularity at the base of the coil pack is stable as compared to multiple prior exams. No residual or recurrent aneurysm. Right PCOM remains grossly patent. Right A1 patent. Left A1 hypoplastic and/or absent. Normal anterior communicating complex. Anterior cerebral arteries patent without stenosis. No M1 stenosis or occlusion. No proximal SCA branch occlusion or high-grade stenosis. Distal MCA branches perfused and symmetric. Posterior circulation: Left vertebral artery dominant and patent without stenosis. Left PICA patent. Hypoplastic right vertebral artery largely terminates in PICA, although a tiny branch ascending towards the vertebrobasilar junction. Right PICA patent. Basilar patent without stenosis. Superior cerebral arteries patent bilaterally. Right PCA supplied via the basilar. Fetal type origin left PCA. PCAs patent to their distal aspects without visible  stenosis. Anatomic variants: As above. IMPRESSION: MRI HEAD IMPRESSION: 1. No acute intracranial abnormality. 2. Chronic microvascular ischemic disease with a few scattered remote infarcts involving the right occipital lobe and bilateral cerebellar hemispheres. 3. Sequelae of prior right frontal ventriculostomy. MRA HEAD IMPRESSION: 1. Stable intracranial MRA from prior. 2. Sequelae of prior coil embolization of right PCOM aneurysm. No residual or recurrent aneurysm by MRA. 3. No hemodynamically significant or correctable stenosis about the major intracranial arterial vasculature. Electronically Signed   By: Rise Mu M.D.   On: 02/23/2023 18:22   MR ANGIO HEAD WO CONTRAST  Result Date: 02/23/2023 CLINICAL DATA:  Initial evaluation for TIA. EXAM: MRI HEAD WITHOUT CONTRAST MRA HEAD WITHOUT CONTRAST TECHNIQUE: Multiplanar, multi-echo pulse sequences of the brain and surrounding structures were acquired without intravenous contrast. Angiographic images of the Circle of Willis  were acquired using MRA technique without intravenous contrast. COMPARISON:  Prior study from 04/24/2021 and earlier. FINDINGS: MRI HEAD FINDINGS Brain: Cerebral volume within normal limits. Chronic microvascular ischemic disease noted involving the periventricular and deep white matter both cerebral hemispheres and pons, moderately advanced. Few scattered remote infarcts involving the right occipital lobe and bilateral cerebellar hemispheres. Sequelae of prior right frontal ventriculostomy noted. No evidence for acute or subacute ischemia. No acute intracranial hemorrhage. Few scattered chronic micro hemorrhages noted within the right cerebral hemisphere and cerebellum. No mass lesion, midline shift or mass effect. No hydrocephalus or extra-axial fluid collection. Pituitary gland suprasellar region within normal limits. Vascular: Major intracranial vascular flow voids are maintained. Skull and upper cervical spine: Craniocervical junction normal. Bone marrow signal intensity normal. No scalp soft tissue abnormality. Sinuses/Orbits: Prior bilateral ocular lens replacement. Paranasal sinuses and mastoid air cells are largely clear. Other: None. MRA HEAD FINDINGS Anterior circulation: Both internal carotid arteries are patent through the siphons to the termini without stenosis. Sequelae of prior coil embolization of right PCOM aneurysm again noted. Residual 4 mm irregularity at the base of the coil pack is stable as compared to multiple prior exams. No residual or recurrent aneurysm. Right PCOM remains grossly patent. Right A1 patent. Left A1 hypoplastic and/or absent. Normal anterior communicating complex. Anterior cerebral arteries patent without stenosis. No M1 stenosis or occlusion. No proximal SCA branch occlusion or high-grade stenosis. Distal MCA branches perfused and symmetric. Posterior circulation: Left vertebral artery dominant and patent without stenosis. Left PICA patent. Hypoplastic right vertebral  artery largely terminates in PICA, although a tiny branch ascending towards the vertebrobasilar junction. Right PICA patent. Basilar patent without stenosis. Superior cerebral arteries patent bilaterally. Right PCA supplied via the basilar. Fetal type origin left PCA. PCAs patent to their distal aspects without visible stenosis. Anatomic variants: As above. IMPRESSION: MRI HEAD IMPRESSION: 1. No acute intracranial abnormality. 2. Chronic microvascular ischemic disease with a few scattered remote infarcts involving the right occipital lobe and bilateral cerebellar hemispheres. 3. Sequelae of prior right frontal ventriculostomy. MRA HEAD IMPRESSION: 1. Stable intracranial MRA from prior. 2. Sequelae of prior coil embolization of right PCOM aneurysm. No residual or recurrent aneurysm by MRA. 3. No hemodynamically significant or correctable stenosis about the major intracranial arterial vasculature. Electronically Signed   By: Rise Mu M.D.   On: 02/23/2023 18:22     Assessment/Plan   1. TIA  - Continue cardiac monitoring and frequent neuro checks, check A1c, lipids, carotid US, and echocardiogram, start ASA 81 and Plavix 75 mg daily after 300 mg Plavix load, consult PT/OT/SLP   2. CKD IV  - Appears close to  baseline  - Renally-dose medications, continue Lasix    3. Ulcerative colitis  - Continue mercaptopurine   4. COPD  - Not in exacerbation on admission  - Continue ICS-LAMA-LABA    5. Sciatica  - Continue gabapentin    DVT prophylaxis: sq heparin  Code Status: Full   Level of Care: Level of care: Telemetry Medical Family Communication: none present  Disposition Plan:  Patient is from: home  Anticipated d/c is to: Home  Anticipated d/c date is: 02/24/23  Patient currently: Pending TIA workup  Consults called: Neurology  Admission status: Observation     Briscoe Deutscher, MD [facility]  02/23/2023, 8:51 PM

## 2023-02-23 NOTE — ED Provider Notes (Signed)
    ED Course / MDM   Clinical Course as of 02/23/23 2015  Sat Feb 23, 2023  1516 Received sign out from Dr. Posey Rea. Pending MRI brain and MRA brain. Had 15 minute episode of left sided facial weakness and facial droop.  [WS]  2014 MRI negative for acute stroke or acute aneurysm.  Findings stable.  Discussed findings with Dr. Amada Jupiter with neurology who recommends admission for TIA given concerning symptoms.  Discussed with Dr. Antionette Char who will admit the patient. Discussed result with patient who is agreeable to admission. [WS]    Clinical Course User Index [WS] Lonell Grandchild, MD   Medical Decision Making Amount and/or Complexity of Data Reviewed Labs: ordered. Radiology: ordered.  Risk Decision regarding hospitalization.          Lonell Grandchild, MD 02/23/23 2015

## 2023-02-23 NOTE — Consult Note (Signed)
Neurology Consultation Reason for Consult: Transient facial droop Referring Physician: Kommor, M  CC: Transient facial weakness  History is obtained from: Patient  HPI: Sean Pope is a 77 y.o. male with a history of intracranial aneurysm treated in 2013 who presents with transient facial droop.  He states that first thing this morning he went to get some coffee and noticed that it looks slightly blurry.  He then went to drink some of it, and noticed that it fell out of his mouth and fell onto his shirt.  He tried a couple more times and each time he was unable to drink.  His daughter then came into the room and stated that his left face was drooping and called 911.  The symptoms lasted for approximately 30 minutes before resolving.  He denies any other symptoms including no weakness, no numbness, or other symptoms.   LKW: 9/6 prior to bed tnk given?: no, resolution of symptoms   Past Medical History:  Diagnosis Date   Acute respiratory failure with hypoxia (HCC) 09/17/2011   Acute respiratory failure with hypoxia and hypercapnia (HCC) 11/15/2020   AKI (acute kidney injury) (HCC)    In the setting of ulcerative colitis flare, question relationship to mesalamine   Anal fissure - posterior 01/22/2014   Anemia-multifactorial 01/05/2014   Blood loss, medications, chronic illness kidney injury   ARF (acute renal failure) (HCC)    B12 deficiency - borderline 12/21/2014   C. difficile colitis 06/02/2014   Chronic kidney disease, stage III (moderate) (HCC) 09/08/2013   Emphysema lung (HCC)    GERD (gastroesophageal reflux disease)    Hypercalcemia    Hyperlipidemia    Hypertension    Latent tuberculosis    Long-term use of immunosuppressant medication-mercaptopurine 01/05/2014   Lung collapse 09/26/2011   Osteoporosis 06/29/2014   DEXA 06/2014 - lowest t score -4.2   Pneumonia due to COVID-19 virus 11/15/2020   Posterior communicating artery aneurysm 09/17/2011   Protein-calorie  malnutrition, severe (HCC) 10/26/2013   Respiratory failure, acute (HCC) 01/07/2014   SAH (subarachnoid hemorrhage) (HCC)    Stroke (HCC)    Ulcerative colitis (HCC) 05/28/2013   Vitamin D deficiency 06/03/2014     Family History  Problem Relation Age of Onset   Heart disease Mother      Social History:  reports that he quit smoking about 11 years ago. His smoking use included cigarettes. He started smoking about 61 years ago. He has a 25 pack-year smoking history. He has never used smokeless tobacco. He reports current alcohol use. He reports that he does not use drugs.   Exam: Current vital signs: BP (!) 151/82   Pulse (!) 57   Temp 98.2 F (36.8 C) (Oral)   Resp 16   Ht 5\' 10"  (1.778 m)   Wt 73.5 kg   SpO2 95%   BMI 23.24 kg/m  Vital signs in last 24 hours: Temp:  [98 F (36.7 C)-98.3 F (36.8 C)] 98.2 F (36.8 C) (09/07 1838) Pulse Rate:  [53-63] 57 (09/07 1900) Resp:  [14-18] 16 (09/07 1900) BP: (112-151)/(71-127) 151/82 (09/07 1900) SpO2:  [95 %-99 %] 95 % (09/07 1900) Weight:  [73.5 kg] 73.5 kg (09/07 1244)   Physical Exam  Appears well-developed and well-nourished.   Neuro: Mental Status: Patient is awake, alert, oriented to person, place, month, year, and situation. Patient is able to give a clear and coherent history. No signs of aphasia or neglect Cranial Nerves: II: Visual Fields are full. Pupils are  equal, round, and reactive to light.   III,IV, VI: EOMI without ptosis or diploplia.  V: Facial sensation is symmetric to temperature VII: Facial movement is symmetric.  VIII: hearing is intact to voice X: Uvula elevates symmetrically XI: Shoulder shrug is symmetric. XII: tongue is midline without atrophy or fasciculations.  Motor: Tone is normal. Bulk is normal. 5/5 strength was present in all four extremities.  Sensory: Sensation is symmetric to light touch and temperature in the arms and legs. Cerebellar: FNF intact bilaterally   I have  reviewed labs in epic and the results pertinent to this consultation are: Creatinine 2.75  I have reviewed the images obtained: MRI/MRA-negative acute, no recurrent or residual aneurysm  Impression: 77 year old male who presents with transient facial droop most consistent with transient ischemic attack.  I would favor treating this as TIA and starting antiplatelet medications as well as assessing for modifiable risk factors.  Recommendations: - HgbA1c, fasting lipid panel -Carotid Dopplers - Frequent neuro checks - Echocardiogram - Prophylactic therapy-Antiplatelet med: Aspirin - dose 81mg  and plavix 75mg  daily  after 300mg  load  - Risk factor modification - Telemetry monitoring - PT consult, OT consult, Speech consult - Stroke team to follow   Ritta Slot, MD [facility] [phone]  If 7pm- 7am, please page neurology on call as listed in AMION.

## 2023-02-23 NOTE — ED Notes (Signed)
ED TO INPATIENT HANDOFF REPORT  ED Nurse Name and Phone #: Milagros Loll [phone]  S Name/Age/Gender Sean Pope 77 y.o. male Room/Bed: 022C/022C  Code Status   Code Status: Prior  Home/SNF/Other Home Patient oriented to: situation Is this baseline? Yes   Triage Complete: Triage complete  Chief Complaint TIA  Triage Note Pt BIB [facility] from home. While pt tried to drink coffee and it kept coming out of his mouth. Pt states he was unable to swallow. Pt's daughter noted facial droop on the L side. When EMS arrived symptoms resolved. Pt's LKW for the first symptoms was 11 this AM. EMS denies noting any focal neuro symptoms on their arrival.   EMS Vitals   CBG 118 144/72 HR 60 SpO2 94% on R/A   Allergies No Known Allergies  Level of Care/Admitting Diagnosis ED Disposition     ED Disposition  Admit   Condition  --   Comment  The patient appears reasonably stabilized for admission considering the current resources, flow, and capabilities available in the ED at this time, and I doubt any other [facility] requiring further screening and/or treatment in the ED prior to admission is  present.          B Medical/Surgery History Past Medical History:  Diagnosis Date   Acute respiratory failure with hypoxia (HCC) 09/17/2011   Acute respiratory failure with hypoxia and hypercapnia (HCC) 11/15/2020   AKI (acute kidney injury) (HCC)    In the setting of ulcerative colitis flare, question relationship to mesalamine   Anal fissure - posterior 01/22/2014   Anemia-multifactorial 01/05/2014   Blood loss, medications, chronic illness kidney injury   ARF (acute renal failure) (HCC)    B12 deficiency - borderline 12/21/2014   C. difficile colitis 06/02/2014   Chronic kidney disease, stage III (moderate) (HCC) 09/08/2013   Emphysema lung (HCC)    GERD (gastroesophageal reflux disease)    Hypercalcemia    Hyperlipidemia    Hypertension    Latent tuberculosis    Long-term use of  immunosuppressant medication-mercaptopurine 01/05/2014   Lung collapse 09/26/2011   Osteoporosis 06/29/2014   DEXA 06/2014 - lowest t score -4.2   Pneumonia due to COVID-19 virus 11/15/2020   Posterior communicating artery aneurysm 09/17/2011   Protein-calorie malnutrition, severe (HCC) 10/26/2013   Respiratory failure, acute (HCC) 01/07/2014   SAH (subarachnoid hemorrhage) (HCC)    Stroke (HCC)    Ulcerative colitis (HCC) 05/28/2013   Vitamin D deficiency 06/03/2014   Past Surgical History:  Procedure Laterality Date   ANEURYSM COILING  2013   Posterior communicating   BIOPSY  11/06/2022   Procedure: BIOPSY;  Surgeon: Iva Boop, MD;  Location: [facility];  Service: Gastroenterology;;   CATARACT EXTRACTION Bilateral aug, sept 2016   COLONOSCOPY W/ BIOPSIES  05/28/2013   COLONOSCOPY WITH PROPOFOL N/A 11/06/2022   Procedure: COLONOSCOPY WITH PROPOFOL;  Surgeon: Iva Boop, MD;  Location: [facility];  Service: Gastroenterology;  Laterality: N/A;   IR ANGIO INTRA EXTRACRAN SEL INTERNAL CAROTID BILAT MOD SED  10/22/2017   IR ANGIO VERTEBRAL SEL VERTEBRAL UNI L MOD SED  10/22/2017     A IV Location/Drains/Wounds Patient Lines/Drains/Airways Status     Active Line/Drains/Airways     Name Placement date Placement time Site Days   Peripheral IV 02/23/23 18 G Left Antecubital 02/23/23  1241  Antecubital  less than 1   Wound / Incision (Open or Dehisced) 11/15/20 Skin tear Arm Lower;Posterior;Right FROM DOG 11/15/20  1915  Arm  830            Intake/Output Last 24 hours No intake or output data in the 24 hours ending 02/23/23 1943  Labs/Imaging Results for orders placed or performed during the hospital encounter of 02/23/23 (from the past 48 hour(s))  Protime-INR     Status: None   Collection Time: 02/23/23 12:57 PM  Result Value Ref Range   Prothrombin Time 12.8 11.4 - 15.2 seconds   INR 0.9 0.8 - 1.2    Comment: (NOTE) INR goal varies based on device and disease  states. Performed at [facility], [address], [place], [place] [address]   APTT     Status: None   Collection Time: 02/23/23 12:57 PM  Result Value Ref Range   aPTT 27 24 - 36 seconds    Comment: Performed at [facility], [address], [place], [place] [address]  CBC     Status: Abnormal   Collection Time: 02/23/23 12:57 PM  Result Value Ref Range   WBC 10.2 4.0 - 10.5 K/uL   RBC 3.25 (L) 4.22 - 5.81 MIL/uL   Hemoglobin 11.2 (L) 13.0 - 17.0 g/dL   HCT 03.4 (L) 74.2 - 59.5 %   MCV 107.7 (H) 80.0 - 100.0 fL   MCH 34.5 (H) 26.0 - 34.0 pg   MCHC 32.0 30.0 - 36.0 g/dL   RDW 63.8 (H) 75.6 - 43.3 %   Platelets 286 150 - 400 K/uL   nRBC 0.3 (H) 0.0 - 0.2 %    Comment: Performed at [facility], [address], [place], [place] [address]  Differential     Status: Abnormal   Collection Time: 02/23/23 12:57 PM  Result Value Ref Range   Neutrophils Relative % 83 %   Neutro Abs 8.4 (H) 1.7 - 7.7 K/uL   Lymphocytes Relative 10 %   Lymphs Abs 1.1 0.7 - 4.0 K/uL   Monocytes Relative 5 %   Monocytes Absolute 0.5 0.1 - 1.0 K/uL   Eosinophils Relative 1 %   Eosinophils Absolute 0.1 0.0 - 0.5 K/uL   Basophils Relative 0 %   Basophils Absolute 0.0 0.0 - 0.1 K/uL   Immature Granulocytes 1 %   Abs Immature Granulocytes 0.08 (H) 0.00 - 0.07 K/uL    Comment: Performed at [facility], [address] [place], [place] [address]  Comprehensive metabolic panel     Status: Abnormal   Collection Time: 02/23/23 12:57 PM  Result Value Ref Range   Sodium 140 135 - 145 mmol/L   Potassium 3.8 3.5 - 5.1 mmol/L   Chloride 100 98 - 111 mmol/L   CO2 29 22 - 32 mmol/L   Glucose, Bld 94 70 - 99 mg/dL    Comment: Glucose reference range applies only to samples taken after fasting for at least 8 hours.   BUN 62 (H) 8 - 23 mg/dL   Creatinine, Ser 0.63 (H) 0.61 - 1.24 mg/dL   Calcium 9.3 8.9 - 01.6 mg/dL   Total Protein 6.4 (L) 6.5 - 8.1 g/dL   Albumin 3.6 3.5 - 5.0 g/dL   AST  15 15 - 41 U/L   ALT 11 0 - 44 U/L   Alkaline Phosphatase 87 38 - 126 U/L   Total Bilirubin 0.7 0.3 - 1.2 mg/dL   GFR, Estimated 23 (L) >60 mL/min    Comment: (NOTE) Calculated using the CKD-EPI Creatinine Equation (2021)    Anion gap 11  5 - 15    Comment: Performed at [facility], [address], [place], [place] [address]  Ethanol     Status: None   Collection Time: 02/23/23 12:57 PM  Result Value Ref Range   Alcohol, Ethyl (B) <10 <10 mg/dL    Comment: (NOTE) Lowest detectable limit for serum alcohol is 10 mg/dL.  For medical purposes only. Performed at [facility], [address], [place], [place] [address]   I-stat chem 8, ED     Status: Abnormal   Collection Time: 02/23/23  1:05 PM  Result Value Ref Range   Sodium 141 135 - 145 mmol/L   Potassium 3.9 3.5 - 5.1 mmol/L   Chloride 100 98 - 111 mmol/L   BUN 57 (H) 8 - 23 mg/dL   Creatinine, Ser 0.98 (H) 0.61 - 1.24 mg/dL   Glucose, Bld 98 70 - 99 mg/dL    Comment: Glucose reference range applies only to samples taken after fasting for at least 8 hours.   Calcium, Ion 1.19 1.15 - 1.40 mmol/L   TCO2 30 22 - 32 mmol/L   Hemoglobin 11.9 (L) 13.0 - 17.0 g/dL   HCT 11.9 (L) 14.7 - 82.9 %   MR BRAIN WO CONTRAST  Result Date: 02/23/2023 CLINICAL DATA:  Initial evaluation for TIA. EXAM: MRI HEAD WITHOUT CONTRAST MRA HEAD WITHOUT CONTRAST TECHNIQUE: Multiplanar, multi-echo pulse sequences of the brain and surrounding structures were acquired without intravenous contrast. Angiographic images of the Circle of Willis were acquired using MRA technique without intravenous contrast. COMPARISON:  Prior study from 04/24/2021 and earlier. FINDINGS: MRI HEAD FINDINGS Brain: Cerebral volume within normal limits. Chronic microvascular ischemic disease noted involving the periventricular and deep white matter both cerebral hemispheres and pons, moderately advanced. Few scattered remote infarcts involving the right occipital lobe and  bilateral cerebellar hemispheres. Sequelae of prior right frontal ventriculostomy noted. No evidence for acute or subacute ischemia. No acute intracranial hemorrhage. Few scattered chronic micro hemorrhages noted within the right cerebral hemisphere and cerebellum. No mass lesion, midline shift or mass effect. No hydrocephalus or extra-axial fluid collection. Pituitary gland suprasellar region within normal limits. Vascular: Major intracranial vascular flow voids are maintained. Skull and upper cervical spine: Craniocervical junction normal. Bone marrow signal intensity normal. No scalp soft tissue abnormality. Sinuses/Orbits: Prior bilateral ocular lens replacement. Paranasal sinuses and mastoid air cells are largely clear. Other: None. MRA HEAD FINDINGS Anterior circulation: Both internal carotid arteries are patent through the siphons to the termini without stenosis. Sequelae of prior coil embolization of right PCOM aneurysm again noted. Residual 4 mm irregularity at the base of the coil pack is stable as compared to multiple prior exams. No residual or recurrent aneurysm. Right PCOM remains grossly patent. Right A1 patent. Left A1 hypoplastic and/or absent. Normal anterior communicating complex. Anterior cerebral arteries patent without stenosis. No M1 stenosis or occlusion. No proximal SCA branch occlusion or high-grade stenosis. Distal MCA branches perfused and symmetric. Posterior circulation: Left vertebral artery dominant and patent without stenosis. Left PICA patent. Hypoplastic right vertebral artery largely terminates in PICA, although a tiny branch ascending towards the vertebrobasilar junction. Right PICA patent. Basilar patent without stenosis. Superior cerebral arteries patent bilaterally. Right PCA supplied via the basilar. Fetal type origin left PCA. PCAs patent to their distal aspects without visible stenosis. Anatomic variants: As above. IMPRESSION: MRI HEAD IMPRESSION: 1. No acute intracranial  abnormality. 2. Chronic microvascular ischemic disease with a few scattered remote infarcts involving the right  occipital lobe and bilateral cerebellar hemispheres. 3. Sequelae of prior right frontal ventriculostomy. MRA HEAD IMPRESSION: 1. Stable intracranial MRA from prior. 2. Sequelae of prior coil embolization of right PCOM aneurysm. No residual or recurrent aneurysm by MRA. 3. No hemodynamically significant or correctable stenosis about the major intracranial arterial vasculature. Electronically Signed   By: Rise Mu M.D.   On: 02/23/2023 18:22   MR ANGIO HEAD WO CONTRAST  Result Date: 02/23/2023 CLINICAL DATA:  Initial evaluation for TIA. EXAM: MRI HEAD WITHOUT CONTRAST MRA HEAD WITHOUT CONTRAST TECHNIQUE: Multiplanar, multi-echo pulse sequences of the brain and surrounding structures were acquired without intravenous contrast. Angiographic images of the Circle of Willis were acquired using MRA technique without intravenous contrast. COMPARISON:  Prior study from 04/24/2021 and earlier. FINDINGS: MRI HEAD FINDINGS Brain: Cerebral volume within normal limits. Chronic microvascular ischemic disease noted involving the periventricular and deep white matter both cerebral hemispheres and pons, moderately advanced. Few scattered remote infarcts involving the right occipital lobe and bilateral cerebellar hemispheres. Sequelae of prior right frontal ventriculostomy noted. No evidence for acute or subacute ischemia. No acute intracranial hemorrhage. Few scattered chronic micro hemorrhages noted within the right cerebral hemisphere and cerebellum. No mass lesion, midline shift or mass effect. No hydrocephalus or extra-axial fluid collection. Pituitary gland suprasellar region within normal limits. Vascular: Major intracranial vascular flow voids are maintained. Skull and upper cervical spine: Craniocervical junction normal. Bone marrow signal intensity normal. No scalp soft tissue abnormality.  Sinuses/Orbits: Prior bilateral ocular lens replacement. Paranasal sinuses and mastoid air cells are largely clear. Other: None. MRA HEAD FINDINGS Anterior circulation: Both internal carotid arteries are patent through the siphons to the termini without stenosis. Sequelae of prior coil embolization of right PCOM aneurysm again noted. Residual 4 mm irregularity at the base of the coil pack is stable as compared to multiple prior exams. No residual or recurrent aneurysm. Right PCOM remains grossly patent. Right A1 patent. Left A1 hypoplastic and/or absent. Normal anterior communicating complex. Anterior cerebral arteries patent without stenosis. No M1 stenosis or occlusion. No proximal SCA branch occlusion or high-grade stenosis. Distal MCA branches perfused and symmetric. Posterior circulation: Left vertebral artery dominant and patent without stenosis. Left PICA patent. Hypoplastic right vertebral artery largely terminates in PICA, although a tiny branch ascending towards the vertebrobasilar junction. Right PICA patent. Basilar patent without stenosis. Superior cerebral arteries patent bilaterally. Right PCA supplied via the basilar. Fetal type origin left PCA. PCAs patent to their distal aspects without visible stenosis. Anatomic variants: As above. IMPRESSION: MRI HEAD IMPRESSION: 1. No acute intracranial abnormality. 2. Chronic microvascular ischemic disease with a few scattered remote infarcts involving the right occipital lobe and bilateral cerebellar hemispheres. 3. Sequelae of prior right frontal ventriculostomy. MRA HEAD IMPRESSION: 1. Stable intracranial MRA from prior. 2. Sequelae of prior coil embolization of right PCOM aneurysm. No residual or recurrent aneurysm by MRA. 3. No hemodynamically significant or correctable stenosis about the major intracranial arterial vasculature. Electronically Signed   By: Rise Mu M.D.   On: 02/23/2023 18:22    Pending Labs Unresulted Labs (From admission,  onward)    None       Vitals/Pain Today's Vitals   02/23/23 1745 02/23/23 1838 02/23/23 1856 02/23/23 1900  BP: (!) 148/127  (!) 144/97 (!) 151/82  Pulse: 63  60 (!) 57  Resp: 14  15 16   Temp:  98.2 F (36.8 C)    TempSrc:  Oral    SpO2: 97%  99% 95%  Weight:  Height:      PainSc:        Isolation Precautions No active isolations  Medications Medications  sodium chloride flush (NS) 0.9 % injection 3 mL (3 mLs Intravenous Not Given 02/23/23 1932)    Mobility walks with device     Focused Assessments Neuro Assessment Handoff:  Swallow screen pass? Yes    NIH Stroke Scale  Dizziness Present: No Headache Present: No Interval: Shift assessment Level of Consciousness (1a.)   : Alert, keenly responsive LOC Questions (1b. )   : Answers both questions correctly LOC Commands (1c. )   : Performs both tasks correctly Best Gaze (2. )  : Normal Visual (3. )  : No visual loss Facial Palsy (4. )    : Normal symmetrical movements Motor Arm, Left (5a. )   : No drift Motor Arm, Right (5b. ) : No drift Motor Leg, Left (6a. )  : No drift Motor Leg, Right (6b. ) : No drift Limb Ataxia (7. ): Absent Sensory (8. )  : Normal, no sensory loss Best Language (9. )  : No aphasia Dysarthria (10. ): Normal Extinction/Inattention (11.)   : No Abnormality Complete NIHSS TOTAL: 0     Neuro Assessment: Exceptions to WDL Neuro Checks:   Shift assessment (02/23/23 1446)  Has TPA been given? No If patient is a Neuro Trauma and patient is going to OR before floor call report to 4N Charge nurse: [phone] or [phone]   R Recommendations: See Admitting Provider Note  Report given to:   Additional Notes: N/A

## 2023-02-23 NOTE — ED Provider Triage Note (Signed)
Emergency Medicine Provider Triage Evaluation Note  Sean Pope , a 77 y.o. male  was evaluated in triage.  Pt complains of transient episode of neurologic deficits.  Patient was last normal at 11 AM when he developed sudden onset left facial droop, difficulty swallowing, difficulty speaking with aphasia.  He has a history of ruptured aneurysm that was coiled.  No focal numbness or weakness of the extremities.  No headaches.  EMS was called out and symptoms resolved between 10 and 30 minutes.  He is now completely asymptomatic.  Review of Systems  Positive: Facial droop, difficulty swallowing, difficulty speaking Negative: Chest pain, shortness of breath  Physical Exam  BP 127/85 (BP Location: Right Arm)   Pulse (!) 57   Temp 98.3 F (36.8 C) (Oral)   Resp 18   Ht 5\' 10"  (1.778 m)   Wt 73.5 kg   SpO2 95%   BMI 23.24 kg/m  Gen:   Awake, no distress   Resp:  Normal effort  MSK:   Moves extremities without difficulty  Neuro:  No cranial nerve deficit, intact strength and sensation 5 out of 5 all 4 extremities  Medical Decision Making  Medically screening exam initiated at 12:59 PM.  Appropriate orders placed.  Glennis Brink was informed that the remainder of the evaluation will be completed by another provider, this initial triage assessment does not replace that evaluation, and the importance of remaining in the ED until their evaluation is complete.  Patient presenting with symptoms of TIA.  CBG was normal with EMS.  Workup initiated to include CT imaging of the head, given the patient's history of ruptured aneurysm status post coiling, CT angiogram head and neck was also ordered.   Ernie Avena, MD 02/23/23 [phone]

## 2023-02-23 NOTE — ED Triage Notes (Signed)
Pt BIB [facility] from home. While pt tried to drink coffee and it kept coming out of his mouth. Pt states he was unable to swallow. Pt's daughter noted facial droop on the L side. When EMS arrived symptoms resolved. Pt's LKW for the first symptoms was 11 this AM. EMS denies noting any focal neuro symptoms on their arrival.   EMS Vitals   CBG 118 144/72 HR 60 SpO2 94% on R/A

## 2023-02-24 ENCOUNTER — Other Ambulatory Visit: Payer: Self-pay | Admitting: Internal Medicine

## 2023-02-24 ENCOUNTER — Observation Stay (HOSPITAL_BASED_OUTPATIENT_CLINIC_OR_DEPARTMENT_OTHER): Payer: PPO

## 2023-02-24 DIAGNOSIS — G459 Transient cerebral ischemic attack, unspecified: Secondary | ICD-10-CM

## 2023-02-24 LAB — HEMOGLOBIN A1C
Hgb A1c MFr Bld: 5.7 % — ABNORMAL HIGH (ref 4.8–5.6)
Mean Plasma Glucose: 116.89 mg/dL

## 2023-02-24 LAB — CBC
HCT: 34.2 % — ABNORMAL LOW (ref 39.0–52.0)
Hemoglobin: 11.1 g/dL — ABNORMAL LOW (ref 13.0–17.0)
MCH: 34.6 pg — ABNORMAL HIGH (ref 26.0–34.0)
MCHC: 32.5 g/dL (ref 30.0–36.0)
MCV: 106.5 fL — ABNORMAL HIGH (ref 80.0–100.0)
Platelets: 238 10*3/uL (ref 150–400)
RBC: 3.21 MIL/uL — ABNORMAL LOW (ref 4.22–5.81)
RDW: 16.7 % — ABNORMAL HIGH (ref 11.5–15.5)
WBC: 7.4 10*3/uL (ref 4.0–10.5)
nRBC: 0.3 % — ABNORMAL HIGH (ref 0.0–0.2)

## 2023-02-24 LAB — COMPREHENSIVE METABOLIC PANEL
ALT: 13 U/L (ref 0–44)
AST: 16 U/L (ref 15–41)
Albumin: 3.6 g/dL (ref 3.5–5.0)
Alkaline Phosphatase: 90 U/L (ref 38–126)
Anion gap: 14 (ref 5–15)
BUN: 59 mg/dL — ABNORMAL HIGH (ref 8–23)
CO2: 30 mmol/L (ref 22–32)
Calcium: 9.4 mg/dL (ref 8.9–10.3)
Chloride: 98 mmol/L (ref 98–111)
Creatinine, Ser: 2.56 mg/dL — ABNORMAL HIGH (ref 0.61–1.24)
GFR, Estimated: 25 mL/min — ABNORMAL LOW (ref >60)
Glucose, Bld: 98 mg/dL (ref 70–99)
Potassium: 3.7 mmol/L (ref 3.5–5.1)
Sodium: 142 mmol/L (ref 135–145)
Total Bilirubin: 1.3 mg/dL — ABNORMAL HIGH (ref 0.3–1.2)
Total Protein: 6.2 g/dL — ABNORMAL LOW (ref 6.5–8.1)

## 2023-02-24 LAB — LIPID PANEL
Cholesterol: 234 mg/dL — ABNORMAL HIGH (ref 0–200)
HDL: 49 mg/dL (ref >40)
LDL Cholesterol: 149 mg/dL — ABNORMAL HIGH (ref 0–99)
Total CHOL/HDL Ratio: 4.8 ratio
Triglycerides: 181 mg/dL — ABNORMAL HIGH (ref <150)
VLDL: 36 mg/dL (ref 0–40)

## 2023-02-24 LAB — ECHOCARDIOGRAM COMPLETE
Height: 70 in
S' Lateral: 2.8 cm
Weight: 2592 [oz_av]

## 2023-02-24 MED ORDER — ATORVASTATIN CALCIUM 40 MG PO TABS: 40.0000 mg | ORAL_TABLET | Freq: Every day | ORAL | 1 refills | Status: DC

## 2023-02-24 MED ORDER — ASPIRIN 81 MG PO TBEC: 81.0000 mg | DELAYED_RELEASE_TABLET | Freq: Every day | ORAL | 1 refills | Status: DC

## 2023-02-24 MED ORDER — CLOPIDOGREL BISULFATE 75 MG PO TABS: 75.0000 mg | ORAL_TABLET | Freq: Every day | ORAL | 0 refills | Status: DC

## 2023-02-24 NOTE — Evaluation (Signed)
Physical Therapy Evaluation/ Discharge Patient Details Name: Sean Pope MRN: [id number] DOB: 1946/01/28 Today's Date: 02/24/2023  History of Present Illness  77 y.o. male admitted 9/7 with left facial weakness with negative MRI. PMhx: SAH s/p coiling in 2013, COPD, ulcerative colitis, CKD, GERD  Clinical Impression  Pt pleasant, no hx of falls with recent sciatica pain causing pt to utilize crutches or cane at home for stability. Pt normally only wears supplemental O2 at night but desaturating to 85% on RA at rest and to 87% on 2L with gait. Pt educated for need to wear O2 at all times at present and continue to monitor with home pulse ox. No deficits in strength, sensation or mobility. Pt stating month as October but otherwise appears cognitively intact. No further acute  P.T. needs and will sign off with pt aware and agreeable.         If plan is discharge home, recommend the following:     Can travel by private vehicle        Equipment Recommendations None recommended by PT  Recommendations for Other Services       Functional Status Assessment Patient has not had a recent decline in their functional status     Precautions / Restrictions Precautions Precautions: Fall;Other (comment) Precaution Comments: watch sats      Mobility  Bed Mobility Overal bed mobility: Modified Independent                  Transfers Overall transfer level: Modified independent                      Ambulation/Gait Ambulation/Gait assistance: Modified independent (Device/Increase time) Gait Distance (Feet): 400 Feet Assistive device: Rolling walker (2 wheels) Gait Pattern/deviations: Step-through pattern, Decreased stride length   Gait velocity interpretation: >2.62 ft/sec, indicative of community ambulatory   General Gait Details: standing pause half way through gait due to desaturation to 89% on 2L with breathing cues able to increase to 92% with additional desaturation  end of gait to 87% with seated rest to recover and increased O2 to 2.5. cues for direction, good speed and safety with use of RW  Stairs            Wheelchair Mobility     Tilt Bed    Modified Rankin (Stroke Patients Only)       Balance Overall balance assessment: Mild deficits observed, not formally tested                                           Pertinent Vitals/Pain Pain Assessment Pain Assessment: 0-10 Pain Score: 3  Pain Location: Rt hip Pain Descriptors / Indicators: Aching Pain Intervention(s): Limited activity within patient's tolerance, Monitored during session, Repositioned    Home Living Family/patient expects to be discharged to:: Private residence Living Arrangements: Children Available Help at Discharge: Family;Available PRN/intermittently Type of Home: House Home Access: Stairs to enter   Entrance Stairs-Number of Steps: 3 Alternate Level Stairs-Number of Steps: 14 Home Layout: Two level;Bed/bath upstairs Home Equipment: Rolling Walker (2 wheels);Crutches;Cane - single point;Wheelchair - manual;BSC/3in1 Additional Comments: wife passed a year ago and pt was caregiver with [employer]    Prior Function Prior Level of Function : Independent/Modified Independent             Mobility Comments: pt has been walking with cane or crutches  for 1 week due to Rt hip sciatica pain ADLs Comments: daughter cooks     Extremity/Trunk Assessment   Upper Extremity Assessment Upper Extremity Assessment: Overall WFL for tasks assessed    Lower Extremity Assessment Lower Extremity Assessment: Overall WFL for tasks assessed    Cervical / Trunk Assessment Cervical / Trunk Assessment: Normal  Communication   Communication Communication: No apparent difficulties  Cognition Arousal: Alert Behavior During Therapy: WFL for tasks assessed/performed Overall Cognitive Status: Impaired/Different from baseline Area of Impairment:  Orientation                 Orientation Level: Time, Disoriented to             General Comments: pt not oriented to month        General Comments      Exercises     Assessment/Plan    PT Assessment Patient does not need any further PT services  PT Problem List         PT Treatment Interventions      PT Goals (Current goals can be found in the Care Plan section)  Acute Rehab PT Goals PT Goal Formulation: All assessment and education complete, DC therapy    Frequency       Co-evaluation               AM-PAC PT "6 Clicks" Mobility  Outcome Measure Help needed turning from your back to your side while in a flat bed without using bedrails?: None Help needed moving from lying on your back to sitting on the side of a flat bed without using bedrails?: None Help needed moving to and from a bed to a chair (including a wheelchair)?: None Help needed standing up from a chair using your arms (e.g., wheelchair or bedside chair)?: None Help needed to walk in hospital room?: None Help needed climbing 3-5 steps with a railing? : A Little 6 Click Score: 23    End of Session Equipment Utilized During Treatment: Oxygen Activity Tolerance: Patient tolerated treatment well Patient left: in bed;with call bell/phone within reach Nurse Communication: Mobility status PT Visit Diagnosis: Other abnormalities of gait and mobility (R26.89)    Time: 0865-7846 PT Time Calculation (min) (ACUTE ONLY): 20 min   Charges:   PT Evaluation $PT Eval Low Complexity: 1 Low   PT General Charges $$ ACUTE PT VISIT: 1 Visit         Merryl Hacker, PT Acute Rehabilitation Services Office: [phone]   Cristine Polio 02/24/2023, 8:39 AM

## 2023-02-24 NOTE — Progress Notes (Addendum)
STROKE TEAM PROGRESS NOTE   BRIEF HPI Mr. Sean Pope is a 77 y.o. male with history of intracranial aneurysm in 2013 s/p coiling, COPD, UC, CKD stage IV who presented with transient left facial weakness of 30 minutes, now resolved.  Patient noted that he had difficulty drinking coffee, spilling out of his mouth and onto his shirt. MR imaging negative for acute stroke.  Carotid ultrasound showed no significant stenosis.  2D echo showed normal EF.  Meets criteria for TIA.  Now discharged on aspirin/Plavix for 3 weeks, then aspirin indefinitely.  SIGNIFICANT HOSPITAL EVENTS 9/7: Admitted.  Symptoms had already resolved by time of admission.  A1c 5.7 LDL 149.  Begun on aspirin. 9/8: Carotid ultrasound/2D echo negative.  Begun on Plavix.  INTERIM HISTORY/SUBJECTIVE On interview, patient is grossly oriented and pleasant.  Appears baseline.  Recounted history as above. Denies other symptoms at time of facial weakness (dizziness, nausea, body weakness/numbness, hearing loss, vision changes.) No complaints now. On 2L home O2 after a COVID infection some weeks ago. Amenable to Aspirin, plavix.   OBJECTIVE  CBC    Component Value Date/Time   WBC 7.4 02/24/2023 0224   RBC 3.21 (L) 02/24/2023 0224   HGB 11.1 (L) 02/24/2023 0224   HCT 34.2 (L) 02/24/2023 0224   PLT 238 02/24/2023 0224   MCV 106.5 (H) 02/24/2023 0224   MCH 34.6 (H) 02/24/2023 0224   MCHC 32.5 02/24/2023 0224   RDW 16.7 (H) 02/24/2023 0224   LYMPHSABS 1.1 02/23/2023 1257   MONOABS 0.5 02/23/2023 1257   EOSABS 0.1 02/23/2023 1257   BASOSABS 0.0 02/23/2023 1257    BMET    Component Value Date/Time   NA 142 02/24/2023 0224   K 3.7 02/24/2023 0224   CL 98 02/24/2023 0224   CO2 30 02/24/2023 0224   GLUCOSE 98 02/24/2023 0224   BUN 59 (H) 02/24/2023 0224   CREATININE 2.56 (H) 02/24/2023 0224   CREATININE 1.61 (H) 10/13/2015 1358   CALCIUM 9.4 02/24/2023 0224   CALCIUM 8.6 01/26/2016 0805   EGFR 27.0 01/08/2023 1036    GFRNONAA 25 (L) 02/24/2023 0224   GFRNONAA 38 (L) 08/09/2015 1011    IMAGING past 24 hours VAS US CAROTID  Result Date: 02/24/2023 Carotid Arterial Duplex Study Patient Name:  Sean Pope  Date of Exam:   02/24/2023 Medical Rec #: [id number]        Accession #:    [id number] Date of Birth: February 09, 1946        Patient Gender: M Patient Age:   36 years Exam Location:  [facility] Procedure:      VAS US CAROTID Referring Phys: Marcial Pacas OPYD --------------------------------------------------------------------------------  Indications:       TIA. Risk Factors:      Hypertension, hyperlipidemia, past history of smoking. Other Factors:     History of SAH w/ posterior communicating aneurysm. Comparison Study:  No prior studies. Performing Technologist: Jean Rosenthal RDMS, RVT  Examination Guidelines: A complete evaluation includes B-mode imaging, spectral Doppler, color Doppler, and power Doppler as needed of all accessible portions of each vessel. Bilateral testing is considered an integral part of a complete examination. Limited examinations for reoccurring indications may be performed as noted.  Right Carotid Findings: +----------+--------+--------+--------+------------------+------------------+           PSV cm/sEDV cm/sStenosisPlaque DescriptionComments           +----------+--------+--------+--------+------------------+------------------+ CCA Prox  125     26                                                   +----------+--------+--------+--------+------------------+------------------+  CCA Distal107     20                                intimal thickening +----------+--------+--------+--------+------------------+------------------+ ICA Prox  83      34      1-39%   heterogenous                         +----------+--------+--------+--------+------------------+------------------+ ICA Distal57      20                                                    +----------+--------+--------+--------+------------------+------------------+ ECA       115     12                                                   +----------+--------+--------+--------+------------------+------------------+ +----------+--------+-------+----------------+-------------------+           PSV cm/sEDV cmsDescribe        Arm Pressure (mmHG) +----------+--------+-------+----------------+-------------------+ [id number]            Multiphasic, WNL                    +----------+--------+-------+----------------+-------------------+ +---------+--------+--+--------+--+---------+ VertebralPSV cm/s33EDV cm/s13Antegrade +---------+--------+--+--------+--+---------+  Left Carotid Findings: +----------+--------+--------+--------+------------------+--------+           PSV cm/sEDV cm/sStenosisPlaque DescriptionComments +----------+--------+--------+--------+------------------+--------+ CCA Prox  111     35                                         +----------+--------+--------+--------+------------------+--------+ CCA Distal72      27                                         +----------+--------+--------+--------+------------------+--------+ ICA Prox  74      22      1-39%   calcific                   +----------+--------+--------+--------+------------------+--------+ ICA Distal66      23                                         +----------+--------+--------+--------+------------------+--------+ ECA       112     23                                         +----------+--------+--------+--------+------------------+--------+ +----------+--------+--------+----------------+-------------------+           PSV cm/sEDV cm/sDescribe        Arm Pressure (mmHG) +----------+--------+--------+----------------+-------------------+ Subclavian140             Multiphasic, WNL                     +----------+--------+--------+----------------+-------------------+ +---------+--------+--+--------+--+---------+ VertebralPSV cm/s79EDV cm/s25Antegrade +---------+--------+--+--------+--+---------+  Summary: Right Carotid: Velocities in the right ICA are consistent with a 1-39% stenosis. Left Carotid: Velocities in the left ICA are consistent with a 1-39% stenosis. Vertebrals:  Bilateral vertebral arteries demonstrate antegrade flow. Subclavians: Normal flow hemodynamics were seen in bilateral subclavian              arteries. *See table(s) above for measurements and observations.  Electronically signed by Delia Heady MD on 02/24/2023 at 12:25:00 PM.    Final    ECHOCARDIOGRAM COMPLETE  Result Date: 02/24/2023    ECHOCARDIOGRAM REPORT   Patient Name:   LADARRIUS LIPINSKI Date of Exam: 02/24/2023 Medical Rec #:  [id number]       Height:       70.0 in Accession #:    [id number]      Weight:       162.0 lb Date of Birth:  07-26-1945       BSA:          1.909 m Patient Age:    77 years        BP:           116/63 mmHg Patient Gender: M               HR:           68 bpm. Exam Location:  Inpatient Procedure: 2D Echo, Cardiac Doppler and Color Doppler Indications:    TIA  History:        Patient has prior history of Echocardiogram examinations, most                 recent 11/18/2020. Abnormal ECG, COPD and chronic kidney disease;                 Risk Factors:Former Smoker.  Sonographer:    Delcie Roch RDCS Referring Phys: [id number] TIMOTHY S OPYD IMPRESSIONS  1. Left ventricular ejection fraction, by estimation, is 60 to 65%. The left ventricle has normal function. The left ventricle has no regional wall motion abnormalities. Left ventricular diastolic parameters were normal.  2. Right ventricular systolic function is normal. The right ventricular size is normal.  3. Right atrial size was mildly dilated.  4. The mitral valve is abnormal. Trivial mitral valve regurgitation. No evidence of mitral stenosis.  5.  Nodular calcification particularly of non coronay cusp also present on TTE done 11/18/20 . The aortic valve is tricuspid. There is moderate calcification of the aortic valve. There is moderate thickening of the aortic valve. Aortic valve regurgitation is not visualized. Aortic valve sclerosis is present, with no evidence of aortic valve stenosis.  6. The inferior vena cava is normal in size with greater than 50% respiratory variability, suggesting right atrial pressure of 3 mmHg. FINDINGS  Left Ventricle: Left ventricular ejection fraction, by estimation, is 60 to 65%. The left ventricle has normal function. The left ventricle has no regional wall motion abnormalities. The left ventricular internal cavity size was normal in size. There is  no left ventricular hypertrophy. Left ventricular diastolic parameters were normal. Right Ventricle: The right ventricular size is normal. No increase in right ventricular wall thickness. Right ventricular systolic function is normal. Left Atrium: Left atrial size was normal in size. Right Atrium: Right atrial size was mildly dilated. Pericardium: There is no evidence of pericardial effusion. Mitral Valve: The mitral valve is abnormal. There is mild thickening of the mitral valve leaflet(s). Trivial mitral valve regurgitation. No evidence of mitral valve stenosis. Tricuspid Valve: The tricuspid valve  is normal in structure. Tricuspid valve regurgitation is mild . No evidence of tricuspid stenosis. Aortic Valve: Nodular calcification particularly of non coronay cusp also present on TTE done 11/18/20. The aortic valve is tricuspid. There is moderate calcification of the aortic valve. There is moderate thickening of the aortic valve. Aortic valve regurgitation is not visualized. Aortic valve sclerosis is present, with no evidence of aortic valve stenosis. Pulmonic Valve: The pulmonic valve was normal in structure. Pulmonic valve regurgitation is not visualized. No evidence of pulmonic  stenosis. Aorta: The aortic root is normal in size and structure. Venous: The inferior vena cava is normal in size with greater than 50% respiratory variability, suggesting right atrial pressure of 3 mmHg. IAS/Shunts: No atrial level shunt detected by color flow Doppler.  LEFT VENTRICLE PLAX 2D LVIDd:         4.70 cm   Diastology LVIDs:         2.80 cm   LV e' medial:  6.64 cm/s LV PW:         0.70 cm   LV e' lateral: 8.16 cm/s LV IVS:        0.90 cm LVOT diam:     1.70 cm LVOT Area:     2.27 cm  RIGHT VENTRICLE          IVC RV Basal diam:  2.60 cm  IVC diam: 1.10 cm TAPSE (M-mode): 2.1 cm LEFT ATRIUM             Index        RIGHT ATRIUM           Index LA diam:        2.90 cm 1.52 cm/m   RA Area:     12.80 cm LA Vol (A2C):   46.6 ml 24.42 ml/m  RA Volume:   30.80 ml  16.14 ml/m LA Vol (A4C):   27.5 ml 14.41 ml/m LA Biplane Vol: 36.0 ml 18.86 ml/m   AORTA Ao Root diam: 2.90 cm Ao Asc diam:  3.10 cm  SHUNTS Systemic Diam: 1.70 cm Charlton Haws MD Electronically signed by Charlton Haws MD Signature Date/Time: 02/24/2023/11:17:13 AM    Final    MR BRAIN WO CONTRAST  Result Date: 02/23/2023 CLINICAL DATA:  Initial evaluation for TIA. EXAM: MRI HEAD WITHOUT CONTRAST MRA HEAD WITHOUT CONTRAST TECHNIQUE: Multiplanar, multi-echo pulse sequences of the brain and surrounding structures were acquired without intravenous contrast. Angiographic images of the Circle of Willis were acquired using MRA technique without intravenous contrast. COMPARISON:  Prior study from 04/24/2021 and earlier. FINDINGS: MRI HEAD FINDINGS Brain: Cerebral volume within normal limits. Chronic microvascular ischemic disease noted involving the periventricular and deep white matter both cerebral hemispheres and pons, moderately advanced. Few scattered remote infarcts involving the right occipital lobe and bilateral cerebellar hemispheres. Sequelae of prior right frontal ventriculostomy noted. No evidence for acute or subacute ischemia. No acute  intracranial hemorrhage. Few scattered chronic micro hemorrhages noted within the right cerebral hemisphere and cerebellum. No mass lesion, midline shift or mass effect. No hydrocephalus or extra-axial fluid collection. Pituitary gland suprasellar region within normal limits. Vascular: Major intracranial vascular flow voids are maintained. Skull and upper cervical spine: Craniocervical junction normal. Bone marrow signal intensity normal. No scalp soft tissue abnormality. Sinuses/Orbits: Prior bilateral ocular lens replacement. Paranasal sinuses and mastoid air cells are largely clear. Other: None. MRA HEAD FINDINGS Anterior circulation: Both internal carotid arteries are patent through the siphons to the termini without stenosis. Sequelae of prior  coil embolization of right PCOM aneurysm again noted. Residual 4 mm irregularity at the base of the coil pack is stable as compared to multiple prior exams. No residual or recurrent aneurysm. Right PCOM remains grossly patent. Right A1 patent. Left A1 hypoplastic and/or absent. Normal anterior communicating complex. Anterior cerebral arteries patent without stenosis. No M1 stenosis or occlusion. No proximal SCA branch occlusion or high-grade stenosis. Distal MCA branches perfused and symmetric. Posterior circulation: Left vertebral artery dominant and patent without stenosis. Left PICA patent. Hypoplastic right vertebral artery largely terminates in PICA, although a tiny branch ascending towards the vertebrobasilar junction. Right PICA patent. Basilar patent without stenosis. Superior cerebral arteries patent bilaterally. Right PCA supplied via the basilar. Fetal type origin left PCA. PCAs patent to their distal aspects without visible stenosis. Anatomic variants: As above. IMPRESSION: MRI HEAD IMPRESSION: 1. No acute intracranial abnormality. 2. Chronic microvascular ischemic disease with a few scattered remote infarcts involving the right occipital lobe and bilateral  cerebellar hemispheres. 3. Sequelae of prior right frontal ventriculostomy. MRA HEAD IMPRESSION: 1. Stable intracranial MRA from prior. 2. Sequelae of prior coil embolization of right PCOM aneurysm. No residual or recurrent aneurysm by MRA. 3. No hemodynamically significant or correctable stenosis about the major intracranial arterial vasculature. Electronically Signed   By: Rise Mu M.D.   On: 02/23/2023 18:22   MR ANGIO HEAD WO CONTRAST  Result Date: 02/23/2023 CLINICAL DATA:  Initial evaluation for TIA. EXAM: MRI HEAD WITHOUT CONTRAST MRA HEAD WITHOUT CONTRAST TECHNIQUE: Multiplanar, multi-echo pulse sequences of the brain and surrounding structures were acquired without intravenous contrast. Angiographic images of the Circle of Willis were acquired using MRA technique without intravenous contrast. COMPARISON:  Prior study from 04/24/2021 and earlier. FINDINGS: MRI HEAD FINDINGS Brain: Cerebral volume within normal limits. Chronic microvascular ischemic disease noted involving the periventricular and deep white matter both cerebral hemispheres and pons, moderately advanced. Few scattered remote infarcts involving the right occipital lobe and bilateral cerebellar hemispheres. Sequelae of prior right frontal ventriculostomy noted. No evidence for acute or subacute ischemia. No acute intracranial hemorrhage. Few scattered chronic micro hemorrhages noted within the right cerebral hemisphere and cerebellum. No mass lesion, midline shift or mass effect. No hydrocephalus or extra-axial fluid collection. Pituitary gland suprasellar region within normal limits. Vascular: Major intracranial vascular flow voids are maintained. Skull and upper cervical spine: Craniocervical junction normal. Bone marrow signal intensity normal. No scalp soft tissue abnormality. Sinuses/Orbits: Prior bilateral ocular lens replacement. Paranasal sinuses and mastoid air cells are largely clear. Other: None. MRA HEAD FINDINGS  Anterior circulation: Both internal carotid arteries are patent through the siphons to the termini without stenosis. Sequelae of prior coil embolization of right PCOM aneurysm again noted. Residual 4 mm irregularity at the base of the coil pack is stable as compared to multiple prior exams. No residual or recurrent aneurysm. Right PCOM remains grossly patent. Right A1 patent. Left A1 hypoplastic and/or absent. Normal anterior communicating complex. Anterior cerebral arteries patent without stenosis. No M1 stenosis or occlusion. No proximal SCA branch occlusion or high-grade stenosis. Distal MCA branches perfused and symmetric. Posterior circulation: Left vertebral artery dominant and patent without stenosis. Left PICA patent. Hypoplastic right vertebral artery largely terminates in PICA, although a tiny branch ascending towards the vertebrobasilar junction. Right PICA patent. Basilar patent without stenosis. Superior cerebral arteries patent bilaterally. Right PCA supplied via the basilar. Fetal type origin left PCA. PCAs patent to their distal aspects without visible stenosis. Anatomic variants: As above. IMPRESSION: MRI HEAD IMPRESSION:  1. No acute intracranial abnormality. 2. Chronic microvascular ischemic disease with a few scattered remote infarcts involving the right occipital lobe and bilateral cerebellar hemispheres. 3. Sequelae of prior right frontal ventriculostomy. MRA HEAD IMPRESSION: 1. Stable intracranial MRA from prior. 2. Sequelae of prior coil embolization of right PCOM aneurysm. No residual or recurrent aneurysm by MRA. 3. No hemodynamically significant or correctable stenosis about the major intracranial arterial vasculature. Electronically Signed   By: Rise Mu M.D.   On: 02/23/2023 18:22    Vitals:   02/24/23 1055 02/24/23 1127 02/24/23 1200 02/24/23 1215  BP:  127/74 (!) 148/73 (!) 144/70  Pulse: 66 66 64 (!) 48  Resp: 18 18 20  (!) 21  Temp:      TempSrc:      SpO2: 100%  100% 100% 100%  Weight:      Height:         PHYSICAL EXAM General:  Alert, well-nourished, well-developed pleasant elderly Caucasian male t in no acute distress Psych:  Mood and affect appropriate for situation CV: Regular rate and rhythm on monitor Respiratory:  Regular, unlabored respirations on room air GI: Abdomen soft and nontender   NEURO:  Mental Status: AA&Ox3, patient is able to give clear and coherent history Speech/Language: speech is without dysarthria or aphasia.  Naming, repetition, fluency, and comprehension intact.  Cranial Nerves:  II: PERRL. Visual fields full.  III, IV, VI: EOMI. Eyelids elevate symmetrically.  V: Sensation is intact to light touch and symmetrical to face.  VII: Face is symmetrical resting and smiling VIII: hearing intact to voice. IX, X: Palate elevates symmetrically. Phonation is normal.  ZO:[id number] shrug 5/5. XII: tongue is midline without fasciculations. Motor: Right leg motion is limited due to sciatica, LLE 3/5, bilateral UE 4+/5.  Tone: is normal and bulk is normal Sensation: Intact to light touch bilaterally. Extinction absent to light touch to DSS.   Coordination: FTN intact bilaterally, HKS: no ataxia in BLE.No drift.  Gait: deferred  ASSESSMENT/PLAN  Subcortical TIA  :  ?left face only Etiology: Likely small vessel disease.  MRI: No acute abnormality. Chronic microvascular ischemic disease, scattered remote right occipital lobe and cerebellar infarcts.  MRA: Stable MRA from prior -- prior coil embolization of PCOM aneurysm. No residual /recurrent aneurysm.  Carotid Doppler: 1-39% stenosis bilaterally, WNL 2D Echo: Unremarkable LDL 149 HgbA1c 5.7 VTE prophylaxis - Aspirin and Plavix in hospital, discharged on aspirin/Plavix for 3 weeks, then aspirin alone indefinitely.  Therapy recommendations:  No needs Disposition:  Discharged home  Hypertension Home meds:  chlorthalidone 12.5 mg Stable, sBPs 120-140s  Blood Pressure  Goal: SBP less than 160   Hyperlipidemia Home meds: None LDL 149, goal < 70 Add Atorvastatin 40  High intensity statin not indicated  Continue statin at discharge  Prediabetes Home meds:  None HgbA1c 5.7, follow with PCP  Other Stroke Risk Factors ETOH use,  advised to drink no more than 1 drink(s) a day Advanced age  Other Active Problems Ulcerative colitis: mercaptopurine COPD: triple therapy Sciatica  Hospital day # 0  STROKE MD NOTE :  I have personally obtained history,examined this patient, reviewed notes, independently viewed imaging studies, participated in medical decision making and plan of care.ROS completed by me personally and pertinent positives fully documented  I have made any additions or clarifications directly to the above note. Agree with note above.  Patient presented with transient left facial droop likely from right brain subcortical TIA from small vessel disease.  Recommend aspirin Plavix  for 3 weeks followed by aspirin alone and aggressive risk factor modification.  Patient counseled to eat a healthy diet as well as be active and exercise regularly.  Follow-up as an outpatient stroke clinic in 2 months. Greater than 50% time during this 50-minute visit was spent on counseling and coordination of care about his facial weakness and discussion about evaluation, prevention and treatment of TIA and stroke and answering questions. Delia Heady, MD Medical Director [facility] Pager: [phone] 02/24/2023 3:48 PM   To contact Stroke Continuity provider, please refer to [email or web address]. After hours, contact General Neurology

## 2023-02-24 NOTE — Progress Notes (Signed)
Carotid duplex bilateral study completed.   Please see CV Proc for preliminary results.   Rachel Hodge, RDMS, RVT  

## 2023-02-24 NOTE — Progress Notes (Signed)
  Echocardiogram 2D Echocardiogram has been performed.  Delcie Roch 02/24/2023, 9:22 AM

## 2023-02-24 NOTE — Discharge Summary (Addendum)
Physician Discharge Summary  Sean Pope WUJ:[id number] DOB: [birth year] DOA: 02/23/2023  PCP: Sean Sax, MD  Admit date: 02/23/2023 Discharge date: 02/24/2023  Admitted From: Home Disposition: Home  Recommendations for Outpatient Follow-up:  Follow up with PCP in 1 week with repeat CBC/BMP Outpatient follow-up with neurology. Follow up in ED if symptoms worsen or new appear   Home Health: No Equipment/Devices: None  Discharge Condition: Stable CODE STATUS: Full Diet recommendation: Heart healthy  Brief/Interim Summary: 77 y.o. male with medical history significant for intracranial aneurysm status post coiling in 2013, COPD, ulcerative colitis, and CKD stage IV presented with left facial weakness which had resolved by the time of EMS arrival.  On presentation, creatinine was 2.75 which is baseline stable.  No acute findings were noted on MRI of brain or MRA of head.  Neurology was consulted.  He was started on aspirin and Plavix.  Subsequently, his condition has remained stable and neurology has cleared the patient for discharge.  Carotid ultrasound and 2D echo have been done.  Discharge patient home today.  Outpatient follow-up with PCP and neurology.  Discharge Diagnoses:   Possible TIA -Presented with left-sided facial weakness which improved on presentation. -No acute findings were noted on MRI of brain or MRA of head.  Neurology was consulted.  He was started on aspirin and Plavix.  Subsequently, his condition has remained stable and neurology has cleared the patient for discharge.   -Carotid ultrasound and 2D echo have been done: Carotid ultrasound showed no significant stenosis.  2D echo showed EF of 65 to 65%..  Discharge patient home today.  Outpatient follow-up with PCP and neurology. -Neurology recommends aspirin and Plavix for 3 weeks then aspirin alone. -LDL 149: Will start Lipitor.  Outpatient follow-up.  A1c 5.7.  CKD stage IV -Creatinine stable.  Continue  home regimen.  Outpatient follow-up with PCP and nephrology  Ulcerative colitis -Continue mercaptopurine.  Outpatient follow-up  COPD Chronic respiratory failure with hypoxia -Stable.  Continue home regimen.  Outpatient follow-up.  Continue supplemental oxygen, patient normally wears supplemental oxygen at home.  Sciatica -Continue gabapentin  anemia of chronic disease Macrocytosis -Possibly from chronic kidney disease.  Hemoglobin stable.  Outpatient follow-up.  Discharge Instructions  Discharge Instructions     Ambulatory referral to Neurology   Complete by: As directed    An appointment is requested in approximately: 2 weeks   Diet - low sodium heart healthy   Complete by: As directed    Increase activity slowly   Complete by: As directed       Allergies as of 02/24/2023   No Known Allergies      Medication List     TAKE these medications    aspirin EC 81 MG tablet Take 1 tablet (81 mg total) by mouth daily. Swallow whole. Start taking on: February 25, 2023   atorvastatin 40 MG tablet Commonly known as: Lipitor Take 1 tablet (40 mg total) by mouth daily.   Breztri Aerosphere 160-9-4.8 MCG/ACT Aero Generic drug: Budeson-Glycopyrrol-Formoterol INHALE 2 PUFFS INTO THE LUNGS IN THE MORNING AND AT BEDTIME.   chlorthalidone 25 MG tablet Commonly known as: HYGROTON Take 12.5 mg by mouth daily.   clopidogrel 75 MG tablet Commonly known as: PLAVIX Take 1 tablet (75 mg total) by mouth daily. Start taking on: February 25, 2023   cyanocobalamin 1000 MCG tablet Commonly known as: VITAMIN B12 Take 1,000 mcg by mouth daily.   furosemide 80 MG tablet Commonly known as: LASIX Take 80  mg by mouth 2 (two) times daily.   gabapentin 100 MG capsule Commonly known as: NEURONTIN Take 1 each morning and again in the mid afternoon and then 2 at night.   Iron 325 (65 Fe) MG Tabs Take 65 mg by mouth 2 (two) times daily.   MENS MULTIVITAMIN PLUS PO Take 1 tablet by  mouth. Centrum Silver   mercaptopurine 50 MG tablet Commonly known as: PURINETHOL TAKE 1 AND 1/2 TABLETS DAILY BY MOUTH   predniSONE 10 MG tablet Commonly known as: DELTASONE Take 1 tablet (10 mg total) by mouth 2 (two) times daily with a meal for 7 days.   Vitamin D3 50 MCG (2000 UT) Tabs Take 50 Units by mouth daily.          Follow-up Information     Sean Sax, MD. Schedule an appointment as soon as possible for a visit in 1 week(s).   Specialty: Family Medicine Why: with repeat bmp Contact information: [address] [place] [address] [phone]                No Known Allergies  Consultations: Neurology.   Procedures/Studies: ECHOCARDIOGRAM COMPLETE  Result Date: 02/24/2023    ECHOCARDIOGRAM REPORT   Patient Name:   Sean Pope Date of Exam: 02/24/2023 Medical Rec #:  [id number]       Height:       70.0 in Accession #:    [id number]      Weight:       162.0 lb Date of Birth:  10/09/1945       BSA:          1.909 m Patient Age:    37 years        BP:           116/63 mmHg Patient Gender: M               HR:           68 bpm. Exam Location:  Inpatient Procedure: 2D Echo, Cardiac Doppler and Color Doppler Indications:    TIA  History:        Patient has prior history of Echocardiogram examinations, most                 recent 11/18/2020. Abnormal ECG, COPD and chronic kidney disease;                 Risk Factors:Former Smoker.  Sonographer:    Delcie Roch RDCS Referring Phys: [id number] TIMOTHY S OPYD IMPRESSIONS  1. Left ventricular ejection fraction, by estimation, is 60 to 65%. The left ventricle has normal function. The left ventricle has no regional wall motion abnormalities. Left ventricular diastolic parameters were normal.  2. Right ventricular systolic function is normal. The right ventricular size is normal.  3. Right atrial size was mildly dilated.  4. The mitral valve is abnormal. Trivial mitral valve regurgitation. No evidence of  mitral stenosis.  5. Nodular calcification particularly of non coronay cusp also present on TTE done 11/18/20 . The aortic valve is tricuspid. There is moderate calcification of the aortic valve. There is moderate thickening of the aortic valve. Aortic valve regurgitation is not visualized. Aortic valve sclerosis is present, with no evidence of aortic valve stenosis.  6. The inferior vena cava is normal in size with greater than 50% respiratory variability, suggesting right atrial pressure of 3 mmHg. FINDINGS  Left Ventricle: Left ventricular ejection fraction, by estimation, is 60 to 65%.  The left ventricle has normal function. The left ventricle has no regional wall motion abnormalities. The left ventricular internal cavity size was normal in size. There is  no left ventricular hypertrophy. Left ventricular diastolic parameters were normal. Right Ventricle: The right ventricular size is normal. No increase in right ventricular wall thickness. Right ventricular systolic function is normal. Left Atrium: Left atrial size was normal in size. Right Atrium: Right atrial size was mildly dilated. Pericardium: There is no evidence of pericardial effusion. Mitral Valve: The mitral valve is abnormal. There is mild thickening of the mitral valve leaflet(s). Trivial mitral valve regurgitation. No evidence of mitral valve stenosis. Tricuspid Valve: The tricuspid valve is normal in structure. Tricuspid valve regurgitation is mild . No evidence of tricuspid stenosis. Aortic Valve: Nodular calcification particularly of non coronay cusp also present on TTE done 11/18/20. The aortic valve is tricuspid. There is moderate calcification of the aortic valve. There is moderate thickening of the aortic valve. Aortic valve regurgitation is not visualized. Aortic valve sclerosis is present, with no evidence of aortic valve stenosis. Pulmonic Valve: The pulmonic valve was normal in structure. Pulmonic valve regurgitation is not visualized. No  evidence of pulmonic stenosis. Aorta: The aortic root is normal in size and structure. Venous: The inferior vena cava is normal in size with greater than 50% respiratory variability, suggesting right atrial pressure of 3 mmHg. IAS/Shunts: No atrial level shunt detected by color flow Doppler.  LEFT VENTRICLE PLAX 2D LVIDd:         4.70 cm   Diastology LVIDs:         2.80 cm   LV e' medial:  6.64 cm/s LV PW:         0.70 cm   LV e' lateral: 8.16 cm/s LV IVS:        0.90 cm LVOT diam:     1.70 cm LVOT Area:     2.27 cm  RIGHT VENTRICLE          IVC RV Basal diam:  2.60 cm  IVC diam: 1.10 cm TAPSE (M-mode): 2.1 cm LEFT ATRIUM             Index        RIGHT ATRIUM           Index LA diam:        2.90 cm 1.52 cm/m   RA Area:     12.80 cm LA Vol (A2C):   46.6 ml 24.42 ml/m  RA Volume:   30.80 ml  16.14 ml/m LA Vol (A4C):   27.5 ml 14.41 ml/m LA Biplane Vol: 36.0 ml 18.86 ml/m   AORTA Ao Root diam: 2.90 cm Ao Asc diam:  3.10 cm  SHUNTS Systemic Diam: 1.70 cm Charlton Haws MD Electronically signed by Charlton Haws MD Signature Date/Time: 02/24/2023/11:17:13 AM    Final    VAS US CAROTID  Result Date: 02/24/2023 Carotid Arterial Duplex Study Patient Name:  CUONG SOLOFF  Date of Exam:   02/24/2023 Medical Rec #: [id number]        Accession #:    [id number] Date of Birth: 1946-06-01        Patient Gender: M Patient Age:   19 years Exam Location:  [facility] Procedure:      VAS US CAROTID Referring Phys: Marcial Pacas OPYD --------------------------------------------------------------------------------  Indications:       TIA. Risk Factors:      Hypertension, hyperlipidemia, past history of smoking. Other Factors:  History of SAH w/ posterior communicating aneurysm. Comparison Study:  No prior studies. Performing Technologist: Jean Rosenthal RDMS, RVT  Examination Guidelines: A complete evaluation includes B-mode imaging, spectral Doppler, color Doppler, and power Doppler as needed of all accessible portions of each  vessel. Bilateral testing is considered an integral part of a complete examination. Limited examinations for reoccurring indications may be performed as noted.  Right Carotid Findings: +----------+--------+--------+--------+------------------+------------------+           PSV cm/sEDV cm/sStenosisPlaque DescriptionComments           +----------+--------+--------+--------+------------------+------------------+ CCA Prox  125     26                                                   +----------+--------+--------+--------+------------------+------------------+ CCA Distal107     20                                intimal thickening +----------+--------+--------+--------+------------------+------------------+ ICA Prox  83      34      1-39%   heterogenous                         +----------+--------+--------+--------+------------------+------------------+ ICA Distal57      20                                                   +----------+--------+--------+--------+------------------+------------------+ ECA       115     12                                                   +----------+--------+--------+--------+------------------+------------------+ +----------+--------+-------+----------------+-------------------+           PSV cm/sEDV cmsDescribe        Arm Pressure (mmHG) +----------+--------+-------+----------------+-------------------+ [id number]            Multiphasic, WNL                    +----------+--------+-------+----------------+-------------------+ +---------+--------+--+--------+--+---------+ VertebralPSV cm/s33EDV cm/s13Antegrade +---------+--------+--+--------+--+---------+  Left Carotid Findings: +----------+--------+--------+--------+------------------+--------+           PSV cm/sEDV cm/sStenosisPlaque DescriptionComments +----------+--------+--------+--------+------------------+--------+ CCA Prox  111     35                                          +----------+--------+--------+--------+------------------+--------+ CCA Distal72      27                                         +----------+--------+--------+--------+------------------+--------+ ICA Prox  74      22      1-39%   calcific                   +----------+--------+--------+--------+------------------+--------+ ICA Distal66      23                                         +----------+--------+--------+--------+------------------+--------+  ECA       112     23                                         +----------+--------+--------+--------+------------------+--------+ +----------+--------+--------+----------------+-------------------+           PSV cm/sEDV cm/sDescribe        Arm Pressure (mmHG) +----------+--------+--------+----------------+-------------------+ Subclavian140             Multiphasic, WNL                    +----------+--------+--------+----------------+-------------------+ +---------+--------+--+--------+--+---------+ VertebralPSV cm/s79EDV cm/s25Antegrade +---------+--------+--+--------+--+---------+   Summary: Right Carotid: Velocities in the right ICA are consistent with a 1-39% stenosis. Left Carotid: Velocities in the left ICA are consistent with a 1-39% stenosis. Vertebrals:  Bilateral vertebral arteries demonstrate antegrade flow. Subclavians: Normal flow hemodynamics were seen in bilateral subclavian              arteries. *See table(s) above for measurements and observations.     Preliminary    MR BRAIN WO CONTRAST  Result Date: 02/23/2023 CLINICAL DATA:  Initial evaluation for TIA. EXAM: MRI HEAD WITHOUT CONTRAST MRA HEAD WITHOUT CONTRAST TECHNIQUE: Multiplanar, multi-echo pulse sequences of the brain and surrounding structures were acquired without intravenous contrast. Angiographic images of the Circle of Willis were acquired using MRA technique without intravenous contrast. COMPARISON:  Prior study from  04/24/2021 and earlier. FINDINGS: MRI HEAD FINDINGS Brain: Cerebral volume within normal limits. Chronic microvascular ischemic disease noted involving the periventricular and deep white matter both cerebral hemispheres and pons, moderately advanced. Few scattered remote infarcts involving the right occipital lobe and bilateral cerebellar hemispheres. Sequelae of prior right frontal ventriculostomy noted. No evidence for acute or subacute ischemia. No acute intracranial hemorrhage. Few scattered chronic micro hemorrhages noted within the right cerebral hemisphere and cerebellum. No mass lesion, midline shift or mass effect. No hydrocephalus or extra-axial fluid collection. Pituitary gland suprasellar region within normal limits. Vascular: Major intracranial vascular flow voids are maintained. Skull and upper cervical spine: Craniocervical junction normal. Bone marrow signal intensity normal. No scalp soft tissue abnormality. Sinuses/Orbits: Prior bilateral ocular lens replacement. Paranasal sinuses and mastoid air cells are largely clear. Other: None. MRA HEAD FINDINGS Anterior circulation: Both internal carotid arteries are patent through the siphons to the termini without stenosis. Sequelae of prior coil embolization of right PCOM aneurysm again noted. Residual 4 mm irregularity at the base of the coil pack is stable as compared to multiple prior exams. No residual or recurrent aneurysm. Right PCOM remains grossly patent. Right A1 patent. Left A1 hypoplastic and/or absent. Normal anterior communicating complex. Anterior cerebral arteries patent without stenosis. No M1 stenosis or occlusion. No proximal SCA branch occlusion or high-grade stenosis. Distal MCA branches perfused and symmetric. Posterior circulation: Left vertebral artery dominant and patent without stenosis. Left PICA patent. Hypoplastic right vertebral artery largely terminates in PICA, although a tiny branch ascending towards the vertebrobasilar  junction. Right PICA patent. Basilar patent without stenosis. Superior cerebral arteries patent bilaterally. Right PCA supplied via the basilar. Fetal type origin left PCA. PCAs patent to their distal aspects without visible stenosis. Anatomic variants: As above. IMPRESSION: MRI HEAD IMPRESSION: 1. No acute intracranial abnormality. 2. Chronic microvascular ischemic disease with a few scattered remote infarcts involving the right occipital lobe and bilateral cerebellar hemispheres. 3. Sequelae of prior right frontal ventriculostomy. MRA HEAD IMPRESSION: 1. Stable intracranial  MRA from prior. 2. Sequelae of prior coil embolization of right PCOM aneurysm. No residual or recurrent aneurysm by MRA. 3. No hemodynamically significant or correctable stenosis about the major intracranial arterial vasculature. Electronically Signed   By: Rise Mu M.D.   On: 02/23/2023 18:22   MR ANGIO HEAD WO CONTRAST  Result Date: 02/23/2023 CLINICAL DATA:  Initial evaluation for TIA. EXAM: MRI HEAD WITHOUT CONTRAST MRA HEAD WITHOUT CONTRAST TECHNIQUE: Multiplanar, multi-echo pulse sequences of the brain and surrounding structures were acquired without intravenous contrast. Angiographic images of the Circle of Willis were acquired using MRA technique without intravenous contrast. COMPARISON:  Prior study from 04/24/2021 and earlier. FINDINGS: MRI HEAD FINDINGS Brain: Cerebral volume within normal limits. Chronic microvascular ischemic disease noted involving the periventricular and deep white matter both cerebral hemispheres and pons, moderately advanced. Few scattered remote infarcts involving the right occipital lobe and bilateral cerebellar hemispheres. Sequelae of prior right frontal ventriculostomy noted. No evidence for acute or subacute ischemia. No acute intracranial hemorrhage. Few scattered chronic micro hemorrhages noted within the right cerebral hemisphere and cerebellum. No mass lesion, midline shift or mass  effect. No hydrocephalus or extra-axial fluid collection. Pituitary gland suprasellar region within normal limits. Vascular: Major intracranial vascular flow voids are maintained. Skull and upper cervical spine: Craniocervical junction normal. Bone marrow signal intensity normal. No scalp soft tissue abnormality. Sinuses/Orbits: Prior bilateral ocular lens replacement. Paranasal sinuses and mastoid air cells are largely clear. Other: None. MRA HEAD FINDINGS Anterior circulation: Both internal carotid arteries are patent through the siphons to the termini without stenosis. Sequelae of prior coil embolization of right PCOM aneurysm again noted. Residual 4 mm irregularity at the base of the coil pack is stable as compared to multiple prior exams. No residual or recurrent aneurysm. Right PCOM remains grossly patent. Right A1 patent. Left A1 hypoplastic and/or absent. Normal anterior communicating complex. Anterior cerebral arteries patent without stenosis. No M1 stenosis or occlusion. No proximal SCA branch occlusion or high-grade stenosis. Distal MCA branches perfused and symmetric. Posterior circulation: Left vertebral artery dominant and patent without stenosis. Left PICA patent. Hypoplastic right vertebral artery largely terminates in PICA, although a tiny branch ascending towards the vertebrobasilar junction. Right PICA patent. Basilar patent without stenosis. Superior cerebral arteries patent bilaterally. Right PCA supplied via the basilar. Fetal type origin left PCA. PCAs patent to their distal aspects without visible stenosis. Anatomic variants: As above. IMPRESSION: MRI HEAD IMPRESSION: 1. No acute intracranial abnormality. 2. Chronic microvascular ischemic disease with a few scattered remote infarcts involving the right occipital lobe and bilateral cerebellar hemispheres. 3. Sequelae of prior right frontal ventriculostomy. MRA HEAD IMPRESSION: 1. Stable intracranial MRA from prior. 2. Sequelae of prior coil  embolization of right PCOM aneurysm. No residual or recurrent aneurysm by MRA. 3. No hemodynamically significant or correctable stenosis about the major intracranial arterial vasculature. Electronically Signed   By: Rise Mu M.D.   On: 02/23/2023 18:22      Subjective: Patient seen and examined at bedside.  Feels better and wants to go home.  Denies any new weakness, numbness, loss of consciousness.  Discharge Exam: Vitals:   02/24/23 1055 02/24/23 1127  BP:  127/74  Pulse: 66 66  Resp: 18 18  Temp:    SpO2: 100% 100%    General: Pt is alert, awake, not in acute distress.  Looks chronically ill and deconditioned.  On 2 L oxygen via nasal cannula. Cardiovascular: rate controlled, S1/S2 + Respiratory: bilateral decreased breath sounds at bases  Abdominal: Soft, NT, ND, bowel sounds + Extremities: no edema, no cyanosis    The results of significant diagnostics from this hospitalization (including imaging, microbiology, ancillary and laboratory) are listed below for reference.     Microbiology: No results found for this or any previous visit (from the past 240 hour(s)).   Labs: BNP (last 3 results) No results for input(s): "BNP" in the last 8760 hours. Basic Metabolic Panel: Recent Labs  Lab 02/23/23 1257 02/23/23 1305 02/24/23 0224  NA 140 141 142  K 3.8 3.9 3.7  CL 100 100 98  CO2 29  --  30  GLUCOSE 94 98 98  BUN 62* 57* 59*  CREATININE 2.75* 2.80* 2.56*  CALCIUM 9.3  --  9.4   Liver Function Tests: Recent Labs  Lab 02/23/23 1257 02/24/23 0224  AST 15 16  ALT 11 13  ALKPHOS 87 90  BILITOT 0.7 1.3*  PROT 6.4* 6.2*  ALBUMIN 3.6 3.6   No results for input(s): "LIPASE", "AMYLASE" in the last 168 hours. No results for input(s): "AMMONIA" in the last 168 hours. CBC: Recent Labs  Lab 02/23/23 1257 02/23/23 1305 02/24/23 0224  WBC 10.2  --  7.4  NEUTROABS 8.4*  --   --   HGB 11.2* 11.9* 11.1*  HCT 35.0* 35.0* 34.2*  MCV 107.7*  --  106.5*   PLT 286  --  238   Cardiac Enzymes: No results for input(s): "CKTOTAL", "CKMB", "CKMBINDEX", "TROPONINI" in the last 168 hours. BNP: Invalid input(s): "POCBNP" CBG: No results for input(s): "GLUCAP" in the last 168 hours. D-Dimer No results for input(s): "DDIMER" in the last 72 hours. Hgb A1c Recent Labs    02/24/23 0224  HGBA1C 5.7*   Lipid Profile Recent Labs    02/24/23 0224  CHOL 234*  HDL 49  LDLCALC 149*  TRIG 181*  CHOLHDL 4.8   Thyroid function studies No results for input(s): "TSH", "T4TOTAL", "T3FREE", "THYROIDAB" in the last 72 hours.  Invalid input(s): "FREET3" Anemia work up No results for input(s): "VITAMINB12", "FOLATE", "FERRITIN", "TIBC", "IRON", "RETICCTPCT" in the last 72 hours. Urinalysis    Component Value Date/Time   COLORURINE YELLOW 01/08/2014 0120   APPEARANCEUR CLOUDY (A) 01/08/2014 0120   LABSPEC 1.020 01/08/2014 0120   PHURINE 5.5 01/08/2014 0120   GLUCOSEU NEGATIVE 01/08/2014 0120   GLUCOSEU NEGATIVE 10/20/2013 0855   HGBUR NEGATIVE 01/08/2014 0120   BILIRUBINUR NEGATIVE 01/08/2014 0120   KETONESUR NEGATIVE 01/08/2014 0120   PROTEINUR NEGATIVE 01/08/2014 0120   UROBILINOGEN 0.2 01/08/2014 0120   NITRITE NEGATIVE 01/08/2014 0120   LEUKOCYTESUR NEGATIVE 01/08/2014 0120   Sepsis Labs Recent Labs  Lab 02/23/23 1257 02/24/23 0224  WBC 10.2 7.4   Microbiology No results found for this or any previous visit (from the past 240 hour(s)).   Time coordinating discharge: 35 minutes  SIGNED:   Glade Lloyd, MD  [facility] 02/24/2023, 11:49 AM

## 2023-02-24 NOTE — ED Notes (Signed)
Patient states he wears 2L of O2 at night at home. Placed him on 2L.

## 2023-02-24 NOTE — ED Notes (Signed)
Patient transported to Vascular 

## 2023-02-24 NOTE — Progress Notes (Signed)
OT Screen Note  Patient Details Name: Sean Pope MRN: [id number] DOB: 18-Feb-1946   Cancelled Evaluation:    Reason Eval/Treat Not Completed: OT screened, no needs identified, will sign off.  Patient evaluated by PT this AM. Pt screened by OT per PT recommendation. Thank you for the referral.   Limmie Patricia, OTR/L,CBIS  Supplemental OT - [facility] and [facility] Secure Chat Preferred   02/24/2023, 8:36 AM

## 2023-02-27 ENCOUNTER — Telehealth: Payer: Self-pay

## 2023-02-27 NOTE — Transitions of Care (Post Inpatient/ED Visit) (Addendum)
   02/27/2023  Name: Sean Pope MRN: [id number] DOB: May 07, 1946  Today's TOC FU Call Status: Today's TOC FU Call Status:: Successful TOC FU Call Completed TOC FU Call Complete Date: 02/27/23 Patient's Name and Date of Birth confirmed.  Transition Care Management Follow-up Telephone Call Date of Discharge: 02/26/23 Discharge Facility: [facility]) Type of Discharge: Emergency Department How have you been since you were released from the hospital?: Better Any questions or concerns?: No  Items Reviewed: Did you receive and understand the discharge instructions provided?: Yes Any new allergies since your discharge?: No Dietary orders reviewed?: No Do you have support at home?: Yes  Medications Reviewed Today: Medications Reviewed Today     Reviewed by Conni Slipper, CPhT (Pharmacy Technician) on 02/23/23 at 2215  Med List Status: Complete   Medication Order Taking? Sig Documenting Provider Last Dose Status Informant  BREZTRI AEROSPHERE 160-9-4.8 MCG/ACT AERO [id number] Yes INHALE 2 PUFFS INTO THE LUNGS IN THE MORNING AND AT BEDTIME. Mliss Sax, MD 02/22/2023 Active Self  chlorthalidone (HYGROTON) 25 MG tablet [id number] Yes Take 12.5 mg by mouth daily. [provider] 02/23/2023 Active Self  Cholecalciferol (VITAMIN D3) 50 MCG (2000 UT) TABS [id number] Yes Take 50 Units by mouth daily. [provider] 02/22/2023 Active Self  Ferrous Sulfate (IRON) 325 (65 FE) MG TABS [id number] Yes Take 65 mg by mouth 2 (two) times daily. [provider] 02/22/2023 Active Self  furosemide (LASIX) 80 MG tablet [id number] Yes Take 80 mg by mouth 2 (two) times daily. [provider] 02/22/2023 Active Self  gabapentin (NEURONTIN) 100 MG capsule [id number] Yes Take 1 each morning and again in the mid afternoon and then 2 at night. Mliss Sax, MD 02/22/2023 Active Self  mercaptopurine (PURINETHOL) 50 MG tablet [id number] Yes TAKE 1 AND 1/2 TABLETS DAILY BY MOUTH  Iva Boop, MD 02/22/2023 Active Self  Multiple Vitamins-Minerals (MENS MULTIVITAMIN PLUS PO) [id number] Yes Take 1 tablet by mouth. Centrum Silver [provider] 02/22/2023 Active Self  predniSONE (DELTASONE) 10 MG tablet [id number] Yes Take 1 tablet (10 mg total) by mouth 2 (two) times daily with a meal for 7 days. Mliss Sax, MD 02/22/2023 Active Self  vitamin B-12 (CYANOCOBALAMIN) 1000 MCG tablet [id number] Yes Take 1,000 mcg by mouth daily. [provider] 02/22/2023 Active Self            Home Care and Equipment/Supplies: Were Home Health Services Ordered?: NA Any new equipment or medical supplies ordered?: NA  Functional Questionnaire: Do you need assistance with bathing/showering or dressing?: No Do you need assistance with meal preparation?: No Do you need assistance with eating?: No Do you have difficulty maintaining continence: No Do you need assistance with getting out of bed/getting out of a chair/moving?: No Do you have difficulty managing or taking your medications?: No  Follow up appointments reviewed: PCP Follow-up appointment confirmed?: No (Pt wants to call back to schedule when he is ok to drive.) Specialist Hospital Follow-up appointment confirmed?: No Do you need transportation to your follow-up appointment?: No Do you understand care options if your condition(s) worsen?: Yes-patient verbalized understanding    SIGNATURE Arvil Persons, BSN, RN

## 2023-02-27 NOTE — Transitions of Care (Post Inpatient/ED Visit) (Signed)
   02/27/2023  Name: Sean Pope MRN: [id number] DOB: May 07, 1946  Today's TOC FU Call Status: Today's TOC FU Call Status:: Successful TOC FU Call Completed TOC FU Call Complete Date: 02/27/23 Patient's Name and Date of Birth confirmed.  Transition Care Management Follow-up Telephone Call Date of Discharge: 02/26/23 Discharge Facility: [facility]) Type of Discharge: Emergency Department How have you been since you were released from the hospital?: Better Any questions or concerns?: No  Items Reviewed: Did you receive and understand the discharge instructions provided?: Yes Any new allergies since your discharge?: No Dietary orders reviewed?: No Do you have support at home?: Yes  Medications Reviewed Today: Medications Reviewed Today     Reviewed by Conni Slipper, CPhT (Pharmacy Technician) on 02/23/23 at 2215  Med List Status: Complete   Medication Order Taking? Sig Documenting Provider Last Dose Status Informant  BREZTRI AEROSPHERE 160-9-4.8 MCG/ACT AERO [id number] Yes INHALE 2 PUFFS INTO THE LUNGS IN THE MORNING AND AT BEDTIME. Mliss Sax, MD 02/22/2023 Active Self  chlorthalidone (HYGROTON) 25 MG tablet [id number] Yes Take 12.5 mg by mouth daily. [provider] 02/23/2023 Active Self  Cholecalciferol (VITAMIN D3) 50 MCG (2000 UT) TABS [id number] Yes Take 50 Units by mouth daily. [provider] 02/22/2023 Active Self  Ferrous Sulfate (IRON) 325 (65 FE) MG TABS [id number] Yes Take 65 mg by mouth 2 (two) times daily. [provider] 02/22/2023 Active Self  furosemide (LASIX) 80 MG tablet [id number] Yes Take 80 mg by mouth 2 (two) times daily. [provider] 02/22/2023 Active Self  gabapentin (NEURONTIN) 100 MG capsule [id number] Yes Take 1 each morning and again in the mid afternoon and then 2 at night. Mliss Sax, MD 02/22/2023 Active Self  mercaptopurine (PURINETHOL) 50 MG tablet [id number] Yes TAKE 1 AND 1/2 TABLETS DAILY BY MOUTH  Iva Boop, MD 02/22/2023 Active Self  Multiple Vitamins-Minerals (MENS MULTIVITAMIN PLUS PO) [id number] Yes Take 1 tablet by mouth. Centrum Silver [provider] 02/22/2023 Active Self  predniSONE (DELTASONE) 10 MG tablet [id number] Yes Take 1 tablet (10 mg total) by mouth 2 (two) times daily with a meal for 7 days. Mliss Sax, MD 02/22/2023 Active Self  vitamin B-12 (CYANOCOBALAMIN) 1000 MCG tablet [id number] Yes Take 1,000 mcg by mouth daily. [provider] 02/22/2023 Active Self            Home Care and Equipment/Supplies: Were Home Health Services Ordered?: NA Any new equipment or medical supplies ordered?: NA  Functional Questionnaire: Do you need assistance with bathing/showering or dressing?: No Do you need assistance with meal preparation?: No Do you need assistance with eating?: No Do you have difficulty maintaining continence: No Do you need assistance with getting out of bed/getting out of a chair/moving?: No Do you have difficulty managing or taking your medications?: No  Follow up appointments reviewed: PCP Follow-up appointment confirmed?: No (Pt wants to call back to schedule when he is ok to drive.) Specialist Hospital Follow-up appointment confirmed?: No Do you need transportation to your follow-up appointment?: No Do you understand care options if your condition(s) worsen?: Yes-patient verbalized understanding    SIGNATURE Arvil Persons, BSN, RN

## 2023-02-27 NOTE — Addendum Note (Signed)
Addended by: Solon Palm on: 02/27/2023 03:38 PM   Modules accepted: Orders

## 2023-02-28 ENCOUNTER — Encounter (HOSPITAL_COMMUNITY): Payer: Self-pay | Admitting: *Deleted

## 2023-03-06 DIAGNOSIS — J9611 Chronic respiratory failure with hypoxia: Secondary | ICD-10-CM | POA: Diagnosis not present

## 2023-03-07 ENCOUNTER — Telehealth: Payer: Self-pay | Admitting: Pulmonary Disease

## 2023-03-07 NOTE — Telephone Encounter (Signed)
Oxygen order from [facility] has been placed in Dr.Dewald's box. Needs to be signed, dated and flow setting circled.

## 2023-03-11 NOTE — Telephone Encounter (Signed)
Sean Pope from [employer] checking on order for oxygen. Sean Pope phone number is [phone].

## 2023-03-12 NOTE — Telephone Encounter (Signed)
Marcelino Duster calling again. She was told the order for O2 would be sent Monday. Has Dr. Leonides Schanz and have we ret order? Please call and let Marcelino Duster know.

## 2023-03-13 NOTE — Telephone Encounter (Signed)
I do not see any cmn's on this maybe the nurse can check the providers box

## 2023-03-13 NOTE — Telephone Encounter (Signed)
PT calling again for the RX to be sent into [facility] for the POC. Can you please call to advise status of order? His # is [phone]
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
## 2023-03-14 NOTE — Telephone Encounter (Signed)
Spoke to patient. He is requesting update on POC order. According to below message, order has not been received at [facility] office.  Left detailed message for Marylene Land with [employer] and requested that she fax order to [facility] office [employer].

## 2023-03-14 NOTE — Telephone Encounter (Signed)
Patient checking on POC order. Patient phone number is [phone].

## 2023-03-15 NOTE — Telephone Encounter (Signed)
Order printed and faxed it to [facility].  Sean Pope with [employer] is aware and voiced her understanding.  Nothing further needed.

## 2023-03-15 NOTE — Telephone Encounter (Signed)
Stephanie checking on faxed form. States any provider can sign the form and put liter flow. Judeth Cornfield phone number is [phone].

## 2023-03-15 NOTE — Telephone Encounter (Signed)
Called and spoke to [place] with [employer] . She is requesting an update on Rx. She stated that patient purchased machine 10 days ago and he is getting anxious.   Dr. Francine Graven, please advise. Thanks

## 2023-03-15 NOTE — Telephone Encounter (Signed)
Order has been received. I have emailed Rx to Dr. Francine Graven.

## 2023-03-25 ENCOUNTER — Telehealth: Payer: Self-pay | Admitting: Pulmonary Disease

## 2023-03-25 NOTE — Telephone Encounter (Signed)
[facility] is calling and states that patient would like to order oxygen from the. Please fax over oxygen prescription to [phone]. Fax is in Dr.Dewald's box. Needs to be signed and returned.

## 2023-03-26 DIAGNOSIS — L821 Other seborrheic keratosis: Secondary | ICD-10-CM | POA: Diagnosis not present

## 2023-03-26 DIAGNOSIS — L570 Actinic keratosis: Secondary | ICD-10-CM | POA: Diagnosis not present

## 2023-03-26 NOTE — Telephone Encounter (Signed)
Form was signed by MR and faxed to [facility].  Closing encounter.

## 2023-03-26 NOTE — Telephone Encounter (Signed)
I can sign

## 2023-03-26 NOTE — Telephone Encounter (Signed)
Chris from [facility] is checking on message for oxygen order. Thayer Ohm phone number is [phone].

## 2023-03-26 NOTE — Telephone Encounter (Signed)
Called and spoke to patient and verified that he would like to purchase POC from [facility]. According to earlier message, Rx has been received and placed in Dr. Lanora Manis folder for signature.   MR, would you be willing to sign Rx. Dr. Francine Graven is out of the office until 10/28.  Lm for Thayer Ohm with [facility] to make him aware.

## 2023-03-27 NOTE — Progress Notes (Unsigned)
[facility] [address] [place]. [place] [address] [phone]       HOSPITAL FOLLOW UP NOTE  Mr. Sean Pope Date of Birth:  [birth year] Medical Record Number:  [id number]   Reason for Referral:  hospital stroke follow up    SUBJECTIVE:   CHIEF COMPLAINT:  No chief complaint on file.   HPI:   Mr. Sean Pope is a 77 y.o. male with history of intracranial aneurysm in 2013 s/p coiling, COPD, UC, CKD stage IV who presented to ED on 02/23/2023 with transient left facial weakness of 30 minutes.  MRI negative for acute stroke, did show remote right occipital lobe and cerebellar infarcts. MRA stable with prior coil embolization of PCOM aneurysm. Carotid doppler 1-39% stenosis bilaterally. Suspected symptoms in setting of TIA secondary to small vessel disease. Started on aspirin and plavx for 3 weeks then aspirin alone and added atorvastatin 40mg  daily for LDL 149. Discharged home without therapy needs.         PERTINENT IMAGING  MRI: No acute abnormality. Chronic microvascular ischemic disease, scattered remote right occipital lobe and cerebellar infarcts.  MRA: Stable MRA from prior -- prior coil embolization of PCOM aneurysm. No residual /recurrent aneurysm.  Carotid Doppler: 1-39% stenosis bilaterally, WNL 2D Echo: Unremarkable LDL 149 HgbA1c 5.7    ROS:   14 system review of systems performed and negative with exception of ***  PMH:  Past Medical History:  Diagnosis Date   Acute respiratory failure with hypoxia (HCC) 09/17/2011   Acute respiratory failure with hypoxia and hypercapnia (HCC) 11/15/2020   AKI (acute kidney injury) (HCC)    In the setting of ulcerative colitis flare, question relationship to mesalamine   Anal fissure - posterior 01/22/2014   Anemia-multifactorial 01/05/2014   Blood loss, medications, chronic illness kidney injury   ARF (acute renal failure) (HCC)    B12 deficiency - borderline 12/21/2014   C. 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   Protein-calorie malnutrition, severe (HCC) 10/26/2013   Respiratory failure, acute (HCC) 01/07/2014   SAH (subarachnoid hemorrhage) (HCC)    Stroke (HCC)    Ulcerative colitis (HCC) 05/28/2013   Vitamin D deficiency 06/03/2014    PSH:  Past Surgical History:  Procedure Laterality Date   ANEURYSM COILING  2013   Posterior communicating   BIOPSY  11/06/2022   Procedure: BIOPSY;  Surgeon: Iva Boop, MD;  Location: [facility];  Service: Gastroenterology;;   CATARACT EXTRACTION Bilateral aug, sept 2016   COLONOSCOPY W/ BIOPSIES  05/28/2013   COLONOSCOPY WITH PROPOFOL N/A 11/06/2022   Procedure: COLONOSCOPY WITH PROPOFOL;  Surgeon: Iva Boop, MD;  Location: [facility];  Service: Gastroenterology;  Laterality: N/A;   IR ANGIO INTRA EXTRACRAN SEL INTERNAL CAROTID BILAT MOD SED  10/22/2017   IR ANGIO VERTEBRAL SEL VERTEBRAL UNI L MOD SED  10/22/2017    Social History:  Social History   Socioeconomic History   Marital status: Widowed    Spouse name: Not on file   Number of children: 2  Years of education: Not on file   Highest education level: Not on file  Occupational History   Occupation: Director    Comment: [employer]    Employer: RETIRED  Tobacco Use   Smoking status: Former    Current packs/day: 0.00    Average packs/day: 0.5 packs/day for 50.0 years (25.0 ttl pk-yrs)    Types: Cigarettes    Start date: 06/18/1961    Quit date: 06/19/2011    Years since quitting: 11.7   Smokeless  tobacco: Never  Vaping Use   Vaping status: Never Used  Substance and Sexual Activity   Alcohol use: Yes    Alcohol/week: 0.0 standard drinks of alcohol    Comment: rare   Drug use: No   Sexual activity: Not Currently  Other Topics Concern   Not on file  Social History Narrative   Married -widowed 2023 1 son and 1 daughter Merry Proud, lives with him, she is a Firefighter)   Clinical biochemist semiretired working as needed   Rare alcohol former smoker no drug use   Social Determinants of Corporate investment banker Strain: Low Risk  (08/20/2022)   Overall Financial Resource Strain (CARDIA)    Difficulty of Paying Living Expenses: Not hard at all  Food Insecurity: No Food Insecurity (08/20/2022)   Hunger Vital Sign    Worried About Running Out of Food in the Last Year: Never true    Ran Out of Food in the Last Year: Never true  Transportation Needs: No Transportation Needs (08/20/2022)   PRAPARE - Administrator, Civil Service (Medical): No    Lack of Transportation (Non-Medical): No  Physical Activity: Inactive (08/20/2022)   Exercise Vital Sign    Days of Exercise per Week: 0 days    Minutes of Exercise per Session: 0 min  Stress: No Stress Concern Present (08/20/2022)   [employer] of Occupational Health - Occupational Stress Questionnaire    Feeling of Stress : Not at all  Social Connections: Not on file  Intimate Partner Violence: Not on file    Family History:  Family History  Problem Relation Age of Onset   Heart disease Mother     Medications:   Current Outpatient Medications on File Prior to Visit  Medication Sig Dispense Refill   aspirin EC 81 MG tablet Take 1 tablet (81 mg total) by mouth daily. Swallow whole. 30 tablet 1   atorvastatin (LIPITOR) 40 MG tablet Take 1 tablet (40 mg total) by mouth daily. 30 tablet 1   BREZTRI AEROSPHERE 160-9-4.8 MCG/ACT AERO INHALE 2 PUFFS INTO THE LUNGS IN THE MORNING AND AT BEDTIME. 10.7 each 6    chlorthalidone (HYGROTON) 25 MG tablet Take 12.5 mg by mouth daily.     Cholecalciferol (VITAMIN D3) 50 MCG (2000 UT) TABS Take 50 Units by mouth daily.     clopidogrel (PLAVIX) 75 MG tablet Take 1 tablet (75 mg total) by mouth daily. 21 tablet 0   Ferrous Sulfate (IRON) 325 (65 FE) MG TABS Take 65 mg by mouth 2 (two) times daily.     furosemide (LASIX) 80 MG tablet Take 80 mg by mouth 2 (two) times daily.     gabapentin (NEURONTIN) 100 MG capsule Take 1 each morning and again in the mid afternoon and then 2 at night. 80 capsule 0   mercaptopurine (PURINETHOL) 50 MG tablet TAKE 1 AND 1/2 TABLETS BY MOUTH DAILY 135 tablet 1   Multiple Vitamins-Minerals (MENS MULTIVITAMIN PLUS PO)  Take 1 tablet by mouth. Centrum Silver     vitamin B-12 (CYANOCOBALAMIN) 1000 MCG tablet Take 1,000 mcg by mouth daily.     No current facility-administered medications on file prior to visit.    Allergies:  No Known Allergies    OBJECTIVE:  Physical Exam  There were no vitals filed for this visit. There is no height or weight on file to calculate BMI. No results found.     11/13/2022    1:08 PM  Depression screen PHQ 2/9  Decreased Interest 0  Down, Depressed, Hopeless 1  PHQ - 2 Score 1     General: well developed, well nourished, seated, in no evident distress Head: head normocephalic and atraumatic.   Neck: supple with no carotid or supraclavicular bruits Cardiovascular: regular rate and rhythm, no murmurs Musculoskeletal: no deformity Skin:  no rash/petichiae Vascular:  Normal pulses all extremities   Neurologic Exam Mental Status: Awake and fully alert. Oriented to place and time. Recent and remote memory intact. Attention span, concentration and fund of knowledge appropriate. Mood and affect appropriate.  Cranial Nerves: Fundoscopic exam reveals sharp disc margins. Pupils equal, briskly reactive to light. Extraocular movements full without nystagmus. Visual fields full to confrontation.  Hearing intact. Facial sensation intact. Face, tongue, palate moves normally and symmetrically.  Motor: Normal bulk and tone. Normal strength in all tested extremity muscles Sensory.: intact to touch , pinprick , position and vibratory sensation.  Coordination: Rapid alternating movements normal in all extremities. Finger-to-nose and heel-to-shin performed accurately bilaterally. Gait and Station: Arises from chair without difficulty. Stance is normal. Gait demonstrates normal stride length and balance with ***. Tandem walk and heel toe ***.  Reflexes: 1+ and symmetric. Toes downgoing.     NIHSS  *** Modified Rankin  ***      ASSESSMENT: Sean Pope is a 77 y.o. year old male with possible TIA  on 02/23/2023 secondary to small vessel disease. Vascular risk factors include prior strokes on imaging, HTN, HLD, pre-DM, s/p aneurysm coiling in 2013, and advanced age.      PLAN:  TIA :  Residual deficit: ***.  Continue aspirin 81mg  daily and atorvastatin (Lipitor) for secondary stroke prevention.   Discussed secondary stroke prevention measures and importance of close PCP follow up for aggressive stroke risk factor management including BP goal<130/90, HLD with LDL goal<70 and DM with A1c.<7 .  Stroke labs 02/2023: LDL 149, A1c 5.7 I have gone over the pathophysiology of stroke, warning signs and symptoms, risk factors and their management in some detail with instructions to go to the closest emergency room for symptoms of concern.     Follow up in *** or call earlier if needed   CC:  [facility] provider: Dr. Pearlean Brownie PCP: Mliss Sax, MD    I spent *** minutes of face-to-face and non-face-to-face time with patient.  This included previsit chart review including review of recent hospitalization, lab review, study review, order entry, electronic health record documentation, patient education regarding recent stroke including etiology, secondary stroke prevention measures and  importance of managing stroke risk factors, residual deficits and typical recovery time and answered all other questions to patient satisfaction   Ihor Austin, AGNP-BC  [facility] [address] [place], [place] [address]  Phone [phone] Fax [phone] Note: This document was prepared with digital dictation and possible smart phrase technology. Any transcriptional errors that result from this process are unintentional.

## 2023-03-28 ENCOUNTER — Telehealth: Payer: Self-pay | Admitting: Family Medicine

## 2023-03-28 ENCOUNTER — Ambulatory Visit: Payer: PPO | Admitting: Adult Health

## 2023-03-28 ENCOUNTER — Encounter: Payer: Self-pay | Admitting: Adult Health

## 2023-03-28 VITALS — BP 148/65 | HR 61 | Ht 70.0 in | Wt 169.9 lb

## 2023-03-28 DIAGNOSIS — G459 Transient cerebral ischemic attack, unspecified: Secondary | ICD-10-CM

## 2023-03-28 NOTE — Patient Instructions (Addendum)
Continue aspirin 81 mg daily  and atorvastatin for secondary stroke prevention  Continue to follow up with PCP regarding blood pressure, pre-DM and cholesterol management  Maintain strict control of hypertension with blood pressure goal below 130/90, pre-diabetes with hemoglobin A1c goal below 7.0 % and cholesterol with LDL cholesterol (bad cholesterol) goal below 70 mg/dL.   Signs of a Stroke? Follow the BEFAST method:  Balance Watch for a sudden loss of balance, trouble with coordination or vertigo Eyes Is there a sudden loss of vision in one or both eyes? Or double vision?  Face: Ask the person to smile. Does one side of the face droop or is it numb?  Arms: Ask the person to raise both arms. Does one arm drift downward? Is there weakness or numbness of a leg? Speech: Ask the person to repeat a simple phrase. Does the speech sound slurred/strange? Is the person confused ? Time: If you observe any of these signs, call 911.        Thank you for coming to see [place] at [facility]. I hope we have been able to provide you high quality care today.  You may receive a patient satisfaction survey over the next few weeks. We would appreciate your feedback and comments so that we may continue to improve ourselves and the health of our patients.    Transient Ischemic Attack A transient ischemic attack (TIA) causes the same symptoms as a stroke, but the symptoms go away quickly. A TIA happens when blood flow to the brain is blocked. Having a TIA means you may be at risk for a stroke. A TIA is a medical emergency. What are the causes? A TIA is caused by a blocked artery in the head or neck. This means the brain does not get the blood supply it needs. A blockage can be caused by: Fatty buildup in an artery in the head or neck. A blood clot. A tear in an artery. Irritation and swelling (inflammation) of an artery. Sometimes the cause is not known. What increases the risk? Certain  things may make you more likely to have a TIA. Some of these are things that you can change, such as: Using products that have nicotine or tobacco. Not being active. Drinking too much alcohol. Using recreational drugs. Health conditions that may increase your risk include: High blood pressure. High cholesterol. Diabetes. Heart disease. A heartbeat that is not regular (atrial fibrillation). Sickle cell disease. Problems with blood clotting. Other risk factors include: Being over the age of 78. Being male. Being very overweight. Sleep problems (sleep apnea). Having a family history of stroke. Having had blood clots, stroke, TIA, or heart attack in the past. What are the signs or symptoms? The symptoms of a TIA are like those of a stroke. They can include: Weakness or loss of feeling in your face, arm, or leg. This often happens on one side of your body. Trouble walking. Trouble moving your arms or legs. Trouble talking or understanding what people are saying. Problems with how you see. Feeling dizzy. Feeling confused. Loss of balance or coordination. Feeling like you may vomit (nausea) or vomiting. Having a very bad headache. If you can, note what time you started to have symptoms. Tell your doctor. How is this treated? The goal of treatment is to lower the risk for a stroke. This may include: Changes to diet and lifestyle, such as getting regular exercise and stopping smoking. Taking medicines to: Thin the blood. Lower blood pressure.  Lower cholesterol. Treating other health conditions, such as diabetes. If testing shows that an artery in your brain is narrow, your doctor may recommend a procedure to: Take the blockage out of your artery. Open or widen an artery in your neck (carotid angioplasty and stenting). Follow these instructions at home: Medicines Take over-the-counter and prescription medicines only as told by your doctor. If you were told to take aspirin or  another medicine to thin your blood, use it exactly as told by your doctor. Taking too much of the medicine can cause bleeding. Taking too little of the medicine may not work to treat the problem. Eating and drinking  Eat 5 or more servings of fruits and vegetables each day. Follow instructions from your doctor about your diet. You may need to follow a certain diet to help lower your risk of a stroke. You may need to: Eat a diet that is low in fat and salt. Eat foods with a lot of fiber. Limit carbohydrates and sugar. If you drink alcohol: Limit how much you have to: 0-1 drink a day for women who are not pregnant. 0-2 drinks a day for men. Know how much alcohol is in a drink. In the [place] one drink equals one 12 oz bottle of beer (355 mL), one 5 oz glass of wine (148 mL), or one 1 oz glass of hard liquor (44 mL). General instructions Keep a healthy weight. Try to get at least 30 minutes of exercise on most days. Get treatment if you have sleep problems. Do not smoke or use any products that contain nicotine or tobacco. If you need help quitting, ask your doctor. Do not use drugs. Keep all follow-up visits. Your doctor will want to know if you have any more symptoms and to check blood labs if any medicines were prescribed. Where to find more information American Stroke Association: stroke.org Get help right away if: You have chest pain. You have a heartbeat that is not regular. You have any signs of a stroke. "BE FAST" is an easy way to remember the main warning signs: B - Balance. Dizziness, sudden trouble walking, or loss of balance. E - Eyes. Trouble seeing or a change in how you see. F - Face. Sudden weakness or loss of feeling of the face. The face or eyelid may droop on one side. A - Arms. Weakness or loss of feeling in an arm. This happens all of a sudden and most often on one side of the body. S - Speech. Sudden trouble speaking, slurred speech, or trouble understanding what  people say. T - Time. Time to call emergency services. Write down what time symptoms started. You have other signs of a stroke, such as: A sudden, very bad headache with no known cause. Feeling like you may vomit. Vomiting. A seizure. These symptoms may be an emergency. Get help right away. Call 911. Do not wait to see if the symptoms will go away. Do not drive yourself to the hospital. This information is not intended to replace advice given to you by your health care provider. Make sure you discuss any questions you have with your health care provider. Document Revised: 11/17/2021 Document Reviewed: 11/17/2021 Elsevier Patient Education  2024 [employer].

## 2023-03-28 NOTE — Telephone Encounter (Signed)
Pt just scheduled his hosp f/up with Dr Doreene Burke on 03/29/23 at 11:00. He called today for this appt.

## 2023-03-29 ENCOUNTER — Encounter: Payer: Self-pay | Admitting: Family Medicine

## 2023-03-29 ENCOUNTER — Telehealth: Payer: Self-pay

## 2023-03-29 ENCOUNTER — Ambulatory Visit (INDEPENDENT_AMBULATORY_CARE_PROVIDER_SITE_OTHER): Payer: PPO | Admitting: Family Medicine

## 2023-03-29 VITALS — BP 137/69 | HR 63 | Temp 97.9°F | Resp 18 | Wt 170.8 lb

## 2023-03-29 DIAGNOSIS — M7741 Metatarsalgia, right foot: Secondary | ICD-10-CM | POA: Diagnosis not present

## 2023-03-29 DIAGNOSIS — E7800 Pure hypercholesterolemia, unspecified: Secondary | ICD-10-CM | POA: Diagnosis not present

## 2023-03-29 DIAGNOSIS — N1831 Chronic kidney disease, stage 3a: Secondary | ICD-10-CM | POA: Diagnosis not present

## 2023-03-29 DIAGNOSIS — J22 Unspecified acute lower respiratory infection: Secondary | ICD-10-CM

## 2023-03-29 DIAGNOSIS — Z8673 Personal history of transient ischemic attack (TIA), and cerebral infarction without residual deficits: Secondary | ICD-10-CM

## 2023-03-29 DIAGNOSIS — M7742 Metatarsalgia, left foot: Secondary | ICD-10-CM | POA: Diagnosis not present

## 2023-03-29 DIAGNOSIS — Z09 Encounter for follow-up examination after completed treatment for conditions other than malignant neoplasm: Secondary | ICD-10-CM

## 2023-03-29 LAB — BASIC METABOLIC PANEL
BUN: 37 mg/dL — ABNORMAL HIGH (ref 6–23)
CO2: 34 meq/L — ABNORMAL HIGH (ref 19–32)
Calcium: 9.4 mg/dL (ref 8.4–10.5)
Chloride: 103 meq/L (ref 96–112)
Creatinine, Ser: 2.27 mg/dL — ABNORMAL HIGH (ref 0.40–1.50)
GFR: 27.11 mL/min — ABNORMAL LOW (ref >60.00)
Glucose, Bld: 89 mg/dL (ref 70–99)
Potassium: 4.2 meq/L (ref 3.5–5.1)
Sodium: 146 meq/L — ABNORMAL HIGH (ref 135–145)

## 2023-03-29 MED ORDER — DOXYCYCLINE HYCLATE 100 MG PO TABS
100.0000 mg | ORAL_TABLET | Freq: Two times a day (BID) | ORAL | 0 refills | Status: AC
Start: 2023-03-29 — End: 2023-04-08

## 2023-03-29 NOTE — Patient Instructions (Signed)
Visit Information  Thank you for taking time to visit with me today. Please don't hesitate to contact me if I can be of assistance to you.   Following are the goals we discussed today:   Goals Addressed             This Visit's Progress    COMPLETED: Care Coordination Activities-No follow up required       Care Coordination Interventions: Advised patient to Annual Wellness exam. Discussed [facility] services and support. Assessed SDOH. Advised to discuss with primary care physician if services needed in the future.          If you are experiencing a Mental Health or Behavioral Health Crisis or need someone to talk to, please call the Suicide and Crisis Lifeline: 988   Patient verbalizes understanding of instructions and care plan provided today and agrees to view in MyChart. Active MyChart status and patient understanding of how to access instructions and care plan via MyChart confirmed with patient.     The patient has been provided with contact information for the care management team and has been advised to call with any health related questions or concerns.   Bary Leriche, RN, MSN [facility], [facility] Management Community Coordinator Direct Dial: [phone]  Fax: [phone] Website: [email or web address]

## 2023-03-29 NOTE — Patient Outreach (Signed)
Care Coordination   In Person Provider Office Visit Note   03/29/2023 Name: Sean Pope MRN: [id number] DOB: 07-Apr-1946  Sean Pope is a 77 y.o. year old male who sees Mliss Sax, MD for primary care. I engaged with Sean Pope in the providers office today.  What matters to the patients health and wellness today?  none    Goals Addressed             This Visit's Progress    COMPLETED: Care CoordinationActivities-No follow up required       Care Coordination Interventions: Advised patient to Annual Wellness exam. Discussed [facility] services and support. Assessed SDOH. Advised to discuss with primary care physician if services needed in the future.        SDOH assessments and interventions completed:  Yes  SDOH Interventions Today    Flowsheet Row Most Recent Value  SDOH Interventions   Housing Interventions Intervention Not Indicated  Health Literacy Interventions Intervention Not Indicated        Care Coordination Interventions:  Yes, provided   Follow up plan: No further intervention required.   Encounter Outcome:  Patient Visit Completed   Bary Leriche, RN, MSN [facility]  [facility], [facility] Management Community Coordinator Direct Dial: [phone]  Fax: [phone] Website: [email or web address]

## 2023-03-29 NOTE — Progress Notes (Signed)
Established Patient Office Visit   Subjective:  Patient ID: Sean Pope, male    DOB: Feb 06, 1946  Age: 77 y.o. MRN: [id number]  Chief Complaint  Patient presents with   Hospitalization Follow-up    PT is here for hospital follow up     HPI Encounter Diagnoses  Name Primary?   Hospital discharge follow-up Yes   Elevated LDL cholesterol level    History of TIA (transient ischemic attack)    Lower respiratory infection    Metatarsalgia of both feet    Stage 3a chronic kidney disease [facility])    Hospital discharge follow-up status post TIA.  MRI was negative for acute stroke.  He has totally recovered.  39% stenosis of right carotid artery.  Was started on atorvastatin 40 by neurology and is tolerating it well.  CKD 3B has been stable.  He is on Lasix 80 twice daily for lower extremity swelling.   ROS   Current Outpatient Medications:    aspirin EC 81 MG tablet, Take 1 tablet (81 mg total) by mouth daily. Swallow whole., Disp: 30 tablet, Rfl: 1   atorvastatin (LIPITOR) 40 MG tablet, Take 1 tablet (40 mg total) by mouth daily., Disp: 30 tablet, Rfl: 1   BREZTRI AEROSPHERE 160-9-4.8 MCG/ACT AERO, INHALE 2 PUFFS INTO THE LUNGS IN THE MORNING AND AT BEDTIME., Disp: 10.7 each, Rfl: 6   chlorthalidone (HYGROTON) 25 MG tablet, Take 12.5 mg by mouth daily., Disp: , Rfl:    Cholecalciferol (VITAMIN D3) 50 MCG (2000 UT) TABS, Take 50 Units by mouth daily., Disp: , Rfl:    doxycycline (VIBRA-TABS) 100 MG tablet, Take 1 tablet (100 mg total) by mouth 2 (two) times daily for 10 days., Disp: 20 tablet, Rfl: 0   Ferrous Sulfate (IRON) 325 (65 FE) MG TABS, Take 65 mg by mouth 2 (two) times daily., Disp: , Rfl:    fluorouracil (EFUDEX) 5 % cream, Apply topically daily., Disp: , Rfl:    furosemide (LASIX) 80 MG tablet, Take 80 mg by mouth 2 (two) times daily., Disp: , Rfl:    gabapentin (NEURONTIN) 100 MG capsule, Take 1 each morning and again in the mid afternoon and then 2 at night., Disp: 80  capsule, Rfl: 0   mercaptopurine (PURINETHOL) 50 MG tablet, TAKE 1 AND 1/2 TABLETS BY MOUTH DAILY, Disp: 135 tablet, Rfl: 1   Multiple Vitamins-Minerals (MENS MULTIVITAMIN PLUS PO), Take 1 tablet by mouth. Centrum Silver, Disp: , Rfl:    vitamin B-12 (CYANOCOBALAMIN) 1000 MCG tablet, Take 1,000 mcg by mouth daily., Disp: , Rfl:    Objective:     BP 137/69 (BP Location: Left Arm, Patient Position: Sitting, Cuff Size: Normal)   Pulse 63   Temp 97.9 F (36.6 C) (Temporal)   Resp 18   Wt 170 lb 12.8 oz (77.5 kg)   SpO2 98% Comment: pt is on 2liters  BMI 24.51 kg/m    Physical Exam   No results found for any visits on 03/29/23.    The ASCVD Risk score (Arnett DK, et al., 2019) failed to calculate for the following reasons:   The patient has a prior MI or stroke diagnosis    Assessment & Plan:   Hospital discharge follow-up  Elevated LDL cholesterol level  History of TIA (transient ischemic attack)  Lower respiratory infection -     Doxycycline Hyclate; Take 1 tablet (100 mg total) by mouth 2 (two) times daily for 10 days.  Dispense: 20 tablet; Refill: 0  Metatarsalgia of both feet  Stage 3a chronic kidney disease (HCC) -     Basic metabolic panel    Return in about 9 weeks (around 05/31/2023).  Can use Voltaren gel applied directly to the metatarsals.  Could consider inserts made at the [employer] store.  Doxycycline twice daily.  New COVID-vaccine from the pharmacy.  Follow-up in 9 weeks to recheck LDL cholesterol and A1c.   Mliss Sax, MD  10/11 addendum: Question need for chlorthalidone.

## 2023-04-05 DIAGNOSIS — J9611 Chronic respiratory failure with hypoxia: Secondary | ICD-10-CM | POA: Diagnosis not present

## 2023-04-22 ENCOUNTER — Other Ambulatory Visit (INDEPENDENT_AMBULATORY_CARE_PROVIDER_SITE_OTHER): Payer: PPO

## 2023-04-22 DIAGNOSIS — K5100 Ulcerative (chronic) pancolitis without complications: Secondary | ICD-10-CM

## 2023-04-22 DIAGNOSIS — Z7960 Long term (current) use of unspecified immunomodulators and immunosuppressants: Secondary | ICD-10-CM | POA: Diagnosis not present

## 2023-04-22 LAB — CBC
HCT: 36.2 % — ABNORMAL LOW (ref 39.0–52.0)
Hemoglobin: 11.6 g/dL — ABNORMAL LOW (ref 13.0–17.0)
MCHC: 32 g/dL (ref 30.0–36.0)
MCV: 109 fL — ABNORMAL HIGH (ref 78.0–100.0)
Platelets: 318 10*3/uL (ref 150.0–400.0)
RBC: 3.32 Mil/uL — ABNORMAL LOW (ref 4.22–5.81)
RDW: 19.8 % — ABNORMAL HIGH (ref 11.5–15.5)
WBC: 8.2 10*3/uL (ref 4.0–10.5)

## 2023-04-22 LAB — HEPATIC FUNCTION PANEL
ALT: 12 U/L (ref 0–53)
AST: 21 U/L (ref 0–37)
Albumin: 4.4 g/dL (ref 3.5–5.2)
Alkaline Phosphatase: 117 U/L (ref 39–117)
Bilirubin, Direct: 0.2 mg/dL (ref 0.0–0.3)
Total Bilirubin: 1.2 mg/dL (ref 0.2–1.2)
Total Protein: 7.2 g/dL (ref 6.0–8.3)

## 2023-04-23 ENCOUNTER — Other Ambulatory Visit: Payer: Self-pay | Admitting: Internal Medicine

## 2023-04-23 DIAGNOSIS — K5100 Ulcerative (chronic) pancolitis without complications: Secondary | ICD-10-CM

## 2023-04-23 DIAGNOSIS — Z7960 Long term (current) use of unspecified immunomodulators and immunosuppressants: Secondary | ICD-10-CM

## 2023-05-06 DIAGNOSIS — J9611 Chronic respiratory failure with hypoxia: Secondary | ICD-10-CM | POA: Diagnosis not present

## 2023-05-20 ENCOUNTER — Ambulatory Visit (INDEPENDENT_AMBULATORY_CARE_PROVIDER_SITE_OTHER): Payer: PPO | Admitting: Family Medicine

## 2023-05-20 ENCOUNTER — Encounter: Payer: Self-pay | Admitting: Family Medicine

## 2023-05-20 VITALS — BP 116/66 | HR 65 | Temp 97.3°F | Ht 70.0 in | Wt 164.0 lb

## 2023-05-20 DIAGNOSIS — R7303 Prediabetes: Secondary | ICD-10-CM | POA: Insufficient documentation

## 2023-05-20 DIAGNOSIS — J432 Centrilobular emphysema: Secondary | ICD-10-CM

## 2023-05-20 DIAGNOSIS — E538 Deficiency of other specified B group vitamins: Secondary | ICD-10-CM | POA: Diagnosis not present

## 2023-05-20 DIAGNOSIS — E7800 Pure hypercholesterolemia, unspecified: Secondary | ICD-10-CM | POA: Diagnosis not present

## 2023-05-20 LAB — HEMOGLOBIN A1C: Hgb A1c MFr Bld: 5.9 % (ref 4.6–6.5)

## 2023-05-20 MED ORDER — GUAIFENESIN ER 600 MG PO TB12
600.0000 mg | ORAL_TABLET | Freq: Two times a day (BID) | ORAL | 3 refills | Status: DC | PRN
Start: 2023-05-20 — End: 2023-08-10

## 2023-05-20 NOTE — Progress Notes (Signed)
Established Patient Office Visit   Subjective:  Patient ID: Sean Pope, male    DOB: 03/13/1946  Age: 77 y.o. MRN: [id number]  Chief Complaint  Patient presents with   Medical Management of Chronic Issues    9 week follow up.    HPI Encounter Diagnoses  Name Primary?   Elevated LDL cholesterol level Yes   Prediabetes    Centrilobular emphysema (HCC)    B12 deficiency - borderline    Follow-up of above.  Continues atorvastatin 40 without issue for elevated LDL cholesterol with recent history of TIA and carotid artery stenosis.  Reports morning congestion with clear phlegm.  Denies fevers chills or postnasal drip.  Continues inhalers as directed.  Recently started on oxygen therapy with a concentrator.   Review of Systems  Constitutional: Negative.  Negative for chills and fever.  HENT: Negative.  Negative for congestion.   Eyes:  Negative for blurred vision, discharge and redness.  Respiratory:  Positive for cough.   Cardiovascular: Negative.   Gastrointestinal:  Negative for abdominal pain.  Genitourinary: Negative.   Musculoskeletal: Negative.  Negative for myalgias.  Skin:  Negative for rash.  Neurological:  Negative for tingling, loss of consciousness and weakness.  Endo/Heme/Allergies:  Negative for polydipsia.     Current Outpatient Medications:    aspirin EC 81 MG tablet, Take 1 tablet (81 mg total) by mouth daily. Swallow whole., Disp: 30 tablet, Rfl: 1   atorvastatin (LIPITOR) 40 MG tablet, Take 1 tablet (40 mg total) by mouth daily., Disp: 30 tablet, Rfl: 1   BREZTRI AEROSPHERE 160-9-4.8 MCG/ACT AERO, INHALE 2 PUFFS INTO THE LUNGS IN THE MORNING AND AT BEDTIME., Disp: 10.7 each, Rfl: 6   chlorthalidone (HYGROTON) 25 MG tablet, Take 12.5 mg by mouth daily., Disp: , Rfl:    Cholecalciferol (VITAMIN D3) 50 MCG (2000 UT) TABS, Take 50 Units by mouth daily., Disp: , Rfl:    Ferrous Sulfate (IRON) 325 (65 FE) MG TABS, Take 65 mg by mouth 2 (two) times daily.,  Disp: , Rfl:    fluorouracil (EFUDEX) 5 % cream, Apply topically daily., Disp: , Rfl:    furosemide (LASIX) 80 MG tablet, Take 80 mg by mouth 2 (two) times daily., Disp: , Rfl:    gabapentin (NEURONTIN) 100 MG capsule, Take 1 each morning and again in the mid afternoon and then 2 at night., Disp: 80 capsule, Rfl: 0   guaiFENesin (MUCINEX) 600 MG 12 hr tablet, Take 1 tablet (600 mg total) by mouth 2 (two) times daily as needed., Disp: 30 tablet, Rfl: 3   mercaptopurine (PURINETHOL) 50 MG tablet, TAKE 1 AND 1/2 TABLETS BY MOUTH DAILY, Disp: 135 tablet, Rfl: 1   Multiple Vitamins-Minerals (MENS MULTIVITAMIN PLUS PO), Take 1 tablet by mouth. Centrum Silver, Disp: , Rfl:    vitamin B-12 (CYANOCOBALAMIN) 1000 MCG tablet, Take 1,000 mcg by mouth daily., Disp: , Rfl:    Objective:     BP 116/66   Pulse 65   Temp (!) 97.3 F (36.3 C)   Ht 5\' 10"  (1.778 m)   Wt 164 lb (74.4 kg)   SpO2 98%   BMI 23.53 kg/m  Wt Readings from Last 3 Encounters:  05/20/23 164 lb (74.4 kg)  03/29/23 170 lb 12.8 oz (77.5 kg)  03/28/23 169 lb 14.4 oz (77.1 kg)      Physical Exam Constitutional:      General: He is not in acute distress.    Appearance: Normal appearance. He is  not ill-appearing, toxic-appearing or diaphoretic.  HENT:     Head: Normocephalic and atraumatic.     Right Ear: External ear normal.     Left Ear: External ear normal.  Eyes:     General: No scleral icterus.       Right eye: No discharge.        Left eye: No discharge.     Extraocular Movements: Extraocular movements intact.     Conjunctiva/sclera: Conjunctivae normal.  Pulmonary:     Effort: Pulmonary effort is normal. No respiratory distress.  Skin:    General: Skin is warm and dry.  Neurological:     Mental Status: He is alert and oriented to person, place, and time.  Psychiatric:        Mood and Affect: Mood normal.        Behavior: Behavior normal.      No results found for any visits on 05/20/23.    The ASCVD  Risk score (Arnett DK, et al., 2019) failed to calculate for the following reasons:   The patient has a prior MI or stroke diagnosis    Assessment & Plan:   Elevated LDL cholesterol level -     LDL cholesterol, direct  Prediabetes -     Hemoglobin A1c  Centrilobular emphysema (HCC) -     guaiFENesin ER; Take 1 tablet (600 mg total) by mouth 2 (two) times daily as needed.  Dispense: 30 tablet; Refill: 3  B12 deficiency - borderline -     Vitamin B12    Return in about 3 months (around 08/18/2023), or moderate sweets and  avoid sugary drinks.Mliss Sax, MD

## 2023-05-21 LAB — LDL CHOLESTEROL, DIRECT: Direct LDL: 133 mg/dL

## 2023-05-27 DIAGNOSIS — L570 Actinic keratosis: Secondary | ICD-10-CM | POA: Diagnosis not present

## 2023-05-29 DIAGNOSIS — N1832 Chronic kidney disease, stage 3b: Secondary | ICD-10-CM | POA: Diagnosis not present

## 2023-05-31 ENCOUNTER — Ambulatory Visit: Payer: PPO | Admitting: Family Medicine

## 2023-06-03 DIAGNOSIS — N184 Chronic kidney disease, stage 4 (severe): Secondary | ICD-10-CM | POA: Diagnosis not present

## 2023-06-03 DIAGNOSIS — I129 Hypertensive chronic kidney disease with stage 1 through stage 4 chronic kidney disease, or unspecified chronic kidney disease: Secondary | ICD-10-CM | POA: Diagnosis not present

## 2023-06-03 DIAGNOSIS — N2581 Secondary hyperparathyroidism of renal origin: Secondary | ICD-10-CM | POA: Diagnosis not present

## 2023-06-05 DIAGNOSIS — J9611 Chronic respiratory failure with hypoxia: Secondary | ICD-10-CM | POA: Diagnosis not present

## 2023-06-27 DIAGNOSIS — N184 Chronic kidney disease, stage 4 (severe): Secondary | ICD-10-CM | POA: Diagnosis not present

## 2023-07-06 DIAGNOSIS — J9611 Chronic respiratory failure with hypoxia: Secondary | ICD-10-CM | POA: Diagnosis not present

## 2023-07-07 ENCOUNTER — Other Ambulatory Visit: Payer: Self-pay | Admitting: Family Medicine

## 2023-07-07 DIAGNOSIS — M7989 Other specified soft tissue disorders: Secondary | ICD-10-CM

## 2023-07-22 ENCOUNTER — Other Ambulatory Visit: Payer: Self-pay | Admitting: Internal Medicine

## 2023-07-22 ENCOUNTER — Other Ambulatory Visit (INDEPENDENT_AMBULATORY_CARE_PROVIDER_SITE_OTHER): Payer: PPO

## 2023-07-22 DIAGNOSIS — K5100 Ulcerative (chronic) pancolitis without complications: Secondary | ICD-10-CM

## 2023-07-22 DIAGNOSIS — Z7960 Long term (current) use of unspecified immunomodulators and immunosuppressants: Secondary | ICD-10-CM | POA: Diagnosis not present

## 2023-07-22 LAB — CBC WITH DIFFERENTIAL/PLATELET
Basophils Absolute: 0.1 10*3/uL (ref 0.0–0.1)
Basophils Relative: 1.2 % (ref 0.0–3.0)
Eosinophils Absolute: 0.3 10*3/uL (ref 0.0–0.7)
Eosinophils Relative: 4.7 % (ref 0.0–5.0)
HCT: 32 % — ABNORMAL LOW (ref 39.0–52.0)
Hemoglobin: 10.7 g/dL — ABNORMAL LOW (ref 13.0–17.0)
Lymphocytes Relative: 21.4 % (ref 12.0–46.0)
Lymphs Abs: 1.5 10*3/uL (ref 0.7–4.0)
MCHC: 33.3 g/dL (ref 30.0–36.0)
MCV: 110 fL — ABNORMAL HIGH (ref 78.0–100.0)
Monocytes Absolute: 0.4 10*3/uL (ref 0.1–1.0)
Monocytes Relative: 6.6 % (ref 3.0–12.0)
Neutro Abs: 4.5 10*3/uL (ref 1.4–7.7)
Neutrophils Relative %: 66.1 % (ref 43.0–77.0)
Platelets: 274 10*3/uL (ref 150.0–400.0)
RBC: 2.91 Mil/uL — ABNORMAL LOW (ref 4.22–5.81)
RDW: 19.3 % — ABNORMAL HIGH (ref 11.5–15.5)
WBC: 6.8 10*3/uL (ref 4.0–10.5)

## 2023-07-22 LAB — HEPATIC FUNCTION PANEL
ALT: 9 U/L (ref 0–53)
AST: 14 U/L (ref 0–37)
Albumin: 4 g/dL (ref 3.5–5.2)
Alkaline Phosphatase: 73 U/L (ref 39–117)
Bilirubin, Direct: 0.1 mg/dL (ref 0.0–0.3)
Total Bilirubin: 0.7 mg/dL (ref 0.2–1.2)
Total Protein: 6.4 g/dL (ref 6.0–8.3)

## 2023-08-05 ENCOUNTER — Other Ambulatory Visit: Payer: Self-pay

## 2023-08-05 ENCOUNTER — Encounter (HOSPITAL_COMMUNITY): Payer: Self-pay

## 2023-08-05 ENCOUNTER — Ambulatory Visit: Payer: Self-pay | Admitting: Family Medicine

## 2023-08-05 ENCOUNTER — Inpatient Hospital Stay (HOSPITAL_COMMUNITY)
Admission: EM | Admit: 2023-08-05 | Discharge: 2023-08-17 | DRG: 189 | Disposition: E | Payer: PPO | Attending: Internal Medicine | Admitting: Internal Medicine

## 2023-08-05 ENCOUNTER — Emergency Department (HOSPITAL_COMMUNITY): Payer: PPO

## 2023-08-05 DIAGNOSIS — N184 Chronic kidney disease, stage 4 (severe): Secondary | ICD-10-CM

## 2023-08-05 DIAGNOSIS — D539 Nutritional anemia, unspecified: Secondary | ICD-10-CM | POA: Diagnosis present

## 2023-08-05 DIAGNOSIS — Z9981 Dependence on supplemental oxygen: Secondary | ICD-10-CM | POA: Diagnosis not present

## 2023-08-05 DIAGNOSIS — K51919 Ulcerative colitis, unspecified with unspecified complications: Secondary | ICD-10-CM | POA: Diagnosis not present

## 2023-08-05 DIAGNOSIS — R918 Other nonspecific abnormal finding of lung field: Secondary | ICD-10-CM | POA: Diagnosis not present

## 2023-08-05 DIAGNOSIS — K219 Gastro-esophageal reflux disease without esophagitis: Secondary | ICD-10-CM | POA: Diagnosis present

## 2023-08-05 DIAGNOSIS — G8929 Other chronic pain: Secondary | ICD-10-CM | POA: Diagnosis present

## 2023-08-05 DIAGNOSIS — Z1152 Encounter for screening for COVID-19: Secondary | ICD-10-CM

## 2023-08-05 DIAGNOSIS — R7303 Prediabetes: Secondary | ICD-10-CM | POA: Diagnosis present

## 2023-08-05 DIAGNOSIS — G629 Polyneuropathy, unspecified: Secondary | ICD-10-CM | POA: Diagnosis present

## 2023-08-05 DIAGNOSIS — Z7951 Long term (current) use of inhaled steroids: Secondary | ICD-10-CM

## 2023-08-05 DIAGNOSIS — J441 Chronic obstructive pulmonary disease with (acute) exacerbation: Secondary | ICD-10-CM

## 2023-08-05 DIAGNOSIS — D84821 Immunodeficiency due to drugs: Secondary | ICD-10-CM | POA: Diagnosis not present

## 2023-08-05 DIAGNOSIS — J210 Acute bronchiolitis due to respiratory syncytial virus: Secondary | ICD-10-CM | POA: Diagnosis not present

## 2023-08-05 DIAGNOSIS — D649 Anemia, unspecified: Secondary | ICD-10-CM | POA: Diagnosis not present

## 2023-08-05 DIAGNOSIS — Z7982 Long term (current) use of aspirin: Secondary | ICD-10-CM

## 2023-08-05 DIAGNOSIS — J9621 Acute and chronic respiratory failure with hypoxia: Principal | ICD-10-CM

## 2023-08-05 DIAGNOSIS — E46 Unspecified protein-calorie malnutrition: Secondary | ICD-10-CM | POA: Diagnosis not present

## 2023-08-05 DIAGNOSIS — I700 Atherosclerosis of aorta: Secondary | ICD-10-CM | POA: Diagnosis not present

## 2023-08-05 DIAGNOSIS — R627 Adult failure to thrive: Secondary | ICD-10-CM | POA: Diagnosis present

## 2023-08-05 DIAGNOSIS — I10 Essential (primary) hypertension: Secondary | ICD-10-CM

## 2023-08-05 DIAGNOSIS — E785 Hyperlipidemia, unspecified: Secondary | ICD-10-CM

## 2023-08-05 DIAGNOSIS — Z66 Do not resuscitate: Secondary | ICD-10-CM | POA: Diagnosis not present

## 2023-08-05 DIAGNOSIS — G9341 Metabolic encephalopathy: Secondary | ICD-10-CM | POA: Diagnosis not present

## 2023-08-05 DIAGNOSIS — B338 Other specified viral diseases: Secondary | ICD-10-CM | POA: Diagnosis not present

## 2023-08-05 DIAGNOSIS — E875 Hyperkalemia: Secondary | ICD-10-CM | POA: Diagnosis not present

## 2023-08-05 DIAGNOSIS — Z8249 Family history of ischemic heart disease and other diseases of the circulatory system: Secondary | ICD-10-CM | POA: Diagnosis not present

## 2023-08-05 DIAGNOSIS — E860 Dehydration: Secondary | ICD-10-CM | POA: Diagnosis not present

## 2023-08-05 DIAGNOSIS — J9622 Acute and chronic respiratory failure with hypercapnia: Secondary | ICD-10-CM | POA: Diagnosis not present

## 2023-08-05 DIAGNOSIS — J439 Emphysema, unspecified: Secondary | ICD-10-CM | POA: Diagnosis not present

## 2023-08-05 DIAGNOSIS — R0603 Acute respiratory distress: Secondary | ICD-10-CM

## 2023-08-05 DIAGNOSIS — I129 Hypertensive chronic kidney disease with stage 1 through stage 4 chronic kidney disease, or unspecified chronic kidney disease: Secondary | ICD-10-CM | POA: Diagnosis not present

## 2023-08-05 DIAGNOSIS — Z8673 Personal history of transient ischemic attack (TIA), and cerebral infarction without residual deficits: Secondary | ICD-10-CM

## 2023-08-05 DIAGNOSIS — J449 Chronic obstructive pulmonary disease, unspecified: Secondary | ICD-10-CM | POA: Diagnosis not present

## 2023-08-05 DIAGNOSIS — J440 Chronic obstructive pulmonary disease with acute lower respiratory infection: Secondary | ICD-10-CM | POA: Diagnosis present

## 2023-08-05 DIAGNOSIS — R000 Tachycardia, unspecified: Secondary | ICD-10-CM | POA: Diagnosis not present

## 2023-08-05 DIAGNOSIS — D631 Anemia in chronic kidney disease: Secondary | ICD-10-CM | POA: Diagnosis present

## 2023-08-05 DIAGNOSIS — R0689 Other abnormalities of breathing: Secondary | ICD-10-CM | POA: Diagnosis not present

## 2023-08-05 DIAGNOSIS — G9340 Encephalopathy, unspecified: Secondary | ICD-10-CM

## 2023-08-05 DIAGNOSIS — Z8615 Personal history of latent tuberculosis infection: Secondary | ICD-10-CM

## 2023-08-05 DIAGNOSIS — E8729 Other acidosis: Secondary | ICD-10-CM | POA: Diagnosis not present

## 2023-08-05 DIAGNOSIS — N179 Acute kidney failure, unspecified: Secondary | ICD-10-CM | POA: Diagnosis present

## 2023-08-05 DIAGNOSIS — Z87891 Personal history of nicotine dependence: Secondary | ICD-10-CM

## 2023-08-05 DIAGNOSIS — R739 Hyperglycemia, unspecified: Secondary | ICD-10-CM | POA: Diagnosis not present

## 2023-08-05 DIAGNOSIS — R0602 Shortness of breath: Secondary | ICD-10-CM | POA: Diagnosis not present

## 2023-08-05 DIAGNOSIS — R059 Cough, unspecified: Secondary | ICD-10-CM | POA: Diagnosis not present

## 2023-08-05 DIAGNOSIS — K5190 Ulcerative colitis, unspecified, without complications: Secondary | ICD-10-CM

## 2023-08-05 DIAGNOSIS — J9611 Chronic respiratory failure with hypoxia: Secondary | ICD-10-CM | POA: Diagnosis not present

## 2023-08-05 DIAGNOSIS — Z8616 Personal history of COVID-19: Secondary | ICD-10-CM | POA: Diagnosis not present

## 2023-08-05 DIAGNOSIS — R54 Age-related physical debility: Secondary | ICD-10-CM | POA: Diagnosis present

## 2023-08-05 DIAGNOSIS — Z7960 Long term (current) use of unspecified immunomodulators and immunosuppressants: Secondary | ICD-10-CM

## 2023-08-05 DIAGNOSIS — Z6823 Body mass index (BMI) 23.0-23.9, adult: Secondary | ICD-10-CM

## 2023-08-05 DIAGNOSIS — Z79899 Other long term (current) drug therapy: Secondary | ICD-10-CM

## 2023-08-05 LAB — CBC WITH DIFFERENTIAL/PLATELET
Abs Immature Granulocytes: 0 10*3/uL (ref 0.00–0.07)
Basophils Absolute: 0 10*3/uL (ref 0.0–0.1)
Basophils Relative: 0 %
Eosinophils Absolute: 0 10*3/uL (ref 0.0–0.5)
Eosinophils Relative: 0 %
HCT: 33.1 % — ABNORMAL LOW (ref 39.0–52.0)
Hemoglobin: 10.5 g/dL — ABNORMAL LOW (ref 13.0–17.0)
Lymphocytes Relative: 1 %
Lymphs Abs: 0.1 10*3/uL — ABNORMAL LOW (ref 0.7–4.0)
MCH: 36.5 pg — ABNORMAL HIGH (ref 26.0–34.0)
MCHC: 31.7 g/dL (ref 30.0–36.0)
MCV: 114.9 fL — ABNORMAL HIGH (ref 80.0–100.0)
Monocytes Absolute: 0.1 10*3/uL (ref 0.1–1.0)
Monocytes Relative: 2 %
Neutro Abs: 5.9 10*3/uL (ref 1.7–7.7)
Neutrophils Relative %: 97 %
Platelets: 258 10*3/uL (ref 150–400)
RBC: 2.88 MIL/uL — ABNORMAL LOW (ref 4.22–5.81)
RDW: 18.1 % — ABNORMAL HIGH (ref 11.5–15.5)
WBC: 6.1 10*3/uL (ref 4.0–10.5)
nRBC: 0.8 % — ABNORMAL HIGH (ref 0.0–0.2)
nRBC: 1 /100{WBCs} — ABNORMAL HIGH

## 2023-08-05 LAB — I-STAT VENOUS BLOOD GAS, ED
Acid-base deficit: 2 mmol/L (ref 0.0–2.0)
Bicarbonate: 26.8 mmol/L (ref 20.0–28.0)
Calcium, Ion: 1.18 mmol/L (ref 1.15–1.40)
HCT: 33 % — ABNORMAL LOW (ref 39.0–52.0)
Hemoglobin: 11.2 g/dL — ABNORMAL LOW (ref 13.0–17.0)
O2 Saturation: 49 %
Potassium: 4.3 mmol/L (ref 3.5–5.1)
Sodium: 143 mmol/L (ref 135–145)
TCO2: 29 mmol/L (ref 22–32)
pCO2, Ven: 68.3 mm[Hg] — ABNORMAL HIGH (ref 44–60)
pH, Ven: 7.203 — ABNORMAL LOW (ref 7.25–7.43)
pO2, Ven: 33 mm[Hg] (ref 32–45)

## 2023-08-05 LAB — COMPREHENSIVE METABOLIC PANEL
ALT: 16 U/L (ref 0–44)
AST: 21 U/L (ref 15–41)
Albumin: 3.9 g/dL (ref 3.5–5.0)
Alkaline Phosphatase: 68 U/L (ref 38–126)
Anion gap: 15 (ref 5–15)
BUN: 44 mg/dL — ABNORMAL HIGH (ref 8–23)
CO2: 25 mmol/L (ref 22–32)
Calcium: 9.1 mg/dL (ref 8.9–10.3)
Chloride: 105 mmol/L (ref 98–111)
Creatinine, Ser: 2.77 mg/dL — ABNORMAL HIGH (ref 0.61–1.24)
GFR, Estimated: 23 mL/min — ABNORMAL LOW (ref >60)
Glucose, Bld: 116 mg/dL — ABNORMAL HIGH (ref 70–99)
Potassium: 4.4 mmol/L (ref 3.5–5.1)
Sodium: 145 mmol/L (ref 135–145)
Total Bilirubin: 1 mg/dL (ref 0.0–1.2)
Total Protein: 6.8 g/dL (ref 6.5–8.1)

## 2023-08-05 LAB — RESP PANEL BY RT-PCR (RSV, FLU A&B, COVID)  RVPGX2
Influenza A by PCR: NEGATIVE
Influenza B by PCR: NEGATIVE
Resp Syncytial Virus by PCR: POSITIVE — AB
SARS Coronavirus 2 by RT PCR: NEGATIVE

## 2023-08-05 LAB — CBG MONITORING, ED: Glucose-Capillary: 218 mg/dL — ABNORMAL HIGH (ref 70–99)

## 2023-08-05 MED ORDER — ALBUTEROL SULFATE (2.5 MG/3ML) 0.083% IN NEBU
2.5000 mg | INHALATION_SOLUTION | RESPIRATORY_TRACT | Status: DC | PRN
  Administered 2023-08-08: 2.5 mg via RESPIRATORY_TRACT
  Filled 2023-08-05: qty 3

## 2023-08-05 MED ORDER — IPRATROPIUM-ALBUTEROL 0.5-2.5 (3) MG/3ML IN SOLN
3.0000 mL | Freq: Four times a day (QID) | RESPIRATORY_TRACT | Status: DC
  Administered 2023-08-05 – 2023-08-08 (×9): 3 mL via RESPIRATORY_TRACT
  Filled 2023-08-05 (×6): qty 3
  Filled 2023-08-05: qty 6
  Filled 2023-08-05: qty 3

## 2023-08-05 MED ORDER — IPRATROPIUM-ALBUTEROL 0.5-2.5 (3) MG/3ML IN SOLN
3.0000 mL | Freq: Once | RESPIRATORY_TRACT | Status: AC
  Administered 2023-08-05: 3 mL via RESPIRATORY_TRACT
  Filled 2023-08-05: qty 3

## 2023-08-05 MED ORDER — GABAPENTIN 100 MG PO CAPS
100.0000 mg | ORAL_CAPSULE | Freq: Two times a day (BID) | ORAL | Status: DC
  Administered 2023-08-06 – 2023-08-08 (×4): 100 mg via ORAL
  Filled 2023-08-05 (×6): qty 1

## 2023-08-05 MED ORDER — ALBUTEROL SULFATE (2.5 MG/3ML) 0.083% IN NEBU: 10.0000 mg/h | INHALATION_SOLUTION | RESPIRATORY_TRACT | Status: AC

## 2023-08-05 MED ORDER — ACETAMINOPHEN 325 MG PO TABS: 650.0000 mg | ORAL_TABLET | Freq: Four times a day (QID) | ORAL | Status: DC | PRN

## 2023-08-05 MED ORDER — ACETAMINOPHEN 650 MG RE SUPP: 650.0000 mg | Freq: Four times a day (QID) | RECTAL | Status: DC | PRN

## 2023-08-05 MED ORDER — ATORVASTATIN CALCIUM 40 MG PO TABS
40.0000 mg | ORAL_TABLET | Freq: Every day | ORAL | Status: DC
  Administered 2023-08-06 – 2023-08-08 (×3): 40 mg via ORAL
  Filled 2023-08-05 (×3): qty 1

## 2023-08-05 MED ORDER — HEPARIN SODIUM (PORCINE) 5000 UNIT/ML IJ SOLN
5000.0000 [IU] | Freq: Three times a day (TID) | INTRAMUSCULAR | Status: DC
  Administered 2023-08-05 – 2023-08-09 (×11): 5000 [IU] via SUBCUTANEOUS
  Filled 2023-08-05 (×11): qty 1

## 2023-08-05 MED ORDER — SODIUM CHLORIDE 0.9 % IV SOLN: INTRAVENOUS | Status: DC

## 2023-08-05 MED ORDER — MERCAPTOPURINE 50 MG PO TABS: 75.0000 mg | ORAL_TABLET | ORAL | Status: DC

## 2023-08-05 MED ORDER — GUAIFENESIN ER 600 MG PO TB12: 600.0000 mg | ORAL_TABLET | Freq: Two times a day (BID) | ORAL | Status: DC | PRN

## 2023-08-05 MED ORDER — BUDESONIDE 0.25 MG/2ML IN SUSP
0.2500 mg | Freq: Two times a day (BID) | RESPIRATORY_TRACT | Status: DC
  Administered 2023-08-05 – 2023-08-09 (×8): 0.25 mg via RESPIRATORY_TRACT
  Filled 2023-08-05 (×8): qty 2

## 2023-08-05 MED ORDER — MAGNESIUM SULFATE 2 GM/50ML IV SOLN
2.0000 g | INTRAVENOUS | Status: AC
Start: 2023-08-05 — End: 2023-08-05
  Administered 2023-08-05: 2 g via INTRAVENOUS
  Filled 2023-08-05: qty 50

## 2023-08-05 MED ORDER — METHYLPREDNISOLONE SODIUM SUCC 40 MG IJ SOLR
40.0000 mg | Freq: Two times a day (BID) | INTRAMUSCULAR | Status: DC
  Administered 2023-08-06 – 2023-08-09 (×7): 40 mg via INTRAVENOUS
  Filled 2023-08-05 (×8): qty 1

## 2023-08-05 NOTE — ED Notes (Signed)
 RT called

## 2023-08-05 NOTE — Telephone Encounter (Signed)
  Chief Complaint: Shortness of breath Symptoms: Cough, Congestion Frequency: started 2.5 days ago Pertinent Negatives: Patient denies fever, chest pain Disposition: [] ED /[] Urgent Care (no appt availability in office) / [] Appointment(In office/virtual)/ []  [place] [facility]/ [] Home Care/ [] Refused Recommended Disposition /[] [place] [facility]/ [x]  Follow-up with PCP Additional Notes: patient's daughter Gearldine Bienenstock calling with concerns about patient. States her father has had increased cough, shortness of breath and congestion for the past 2.5 days. Per daughter, patient is unwilling to see doctor and get any additional help. Daughter states patient is SOB with most movement. Patient is unwilling to listen to daughter and has yelled at her already today. Daughter is requesting for Dr.Kremer to call and speak with patient. Daughter believes that the patient is more likely to listen to Dr.Kremer over anyone else. Daughter verbalizes understanding and all questions answered.     Copied from CRM [id number]. Topic: Clinical - Red Word Triage >> Aug 05, 2023 11:00 AM Fredrich Romans wrote: Red Word that prompted transfer to Nurse Triage: Shortness of breath ,hard time breathing ,chest congestion Reason for Disposition  [1] Longstanding difficulty breathing (e.g., CHF, COPD, emphysema) AND [2] WORSE than normal  Answer Assessment - Initial Assessment Questions 1. RESPIRATORY STATUS: "Describe your breathing?" (e.g., wheezing, shortness of breath, unable to speak, severe coughing)      Shortness of breath 2. ONSET: "When did this breathing problem begin?"      Started 2.5 days ago 3. PATTERN "Does the difficult breathing come and go, or has it been constant since it started?"      constant 4. SEVERITY: "How bad is your breathing?" (e.g., mild, moderate, severe)    - MILD: No SOB at rest, mild SOB with walking, speaks normally in sentences, can lie down, no retractions, pulse < 100.    - MODERATE: SOB at  rest, SOB with minimal exertion and prefers to sit, cannot lie down flat, speaks in phrases, mild retractions, audible wheezing, pulse 100-120.    - SEVERE: Very SOB at rest, speaks in single words, struggling to breathe, sitting hunched forward, retractions, pulse > 120      Moderate to Severe 5. RECURRENT SYMPTOM: "Have you had difficulty breathing before?" If Yes, ask: "When was the last time?" and "What happened that time?"      Short of breath at baseline 6. CARDIAC HISTORY: "Do you have any history of heart disease?" (e.g., heart attack, angina, bypass surgery, angioplasty)      No 7. LUNG HISTORY: "Do you have any history of lung disease?"  (e.g., pulmonary embolus, asthma, emphysema)     COPD, emphysema 8. CAUSE: "What do you think is causing the breathing problem?"      Possible cold with severe congestion 9. OTHER SYMPTOMS: "Do you have any other symptoms? (e.g., dizziness, runny nose, cough, chest pain, fever)     cough 10. O2 SATURATION MONITOR:  "Do you use an oxygen saturation monitor (pulse oximeter) at home?" If Yes, ask: "What is your reading (oxygen level) today?" "What is your usual oxygen saturation reading?" (e.g., 95%)       Daughter is able to check but can't at this time 12. TRAVEL: "Have you traveled out of the country in the last month?" (e.g., travel history, exposures)       NO  Protocols used: Breathing Difficulty-A-AH

## 2023-08-05 NOTE — ED Notes (Signed)
RN updated pt and daughter on care and expectations. No additional questions at this time. Pt provided call bell and educated to push if needed. Urinal at bedside.

## 2023-08-05 NOTE — ED Provider Notes (Addendum)
  Physical Exam  BP (!) 110/55   Pulse 85   Temp 97.7 F (36.5 C) (Oral)   Resp (!) 25   SpO2 100%   Physical Exam  Procedures  Procedures  ED Course / MDM    Medical Decision Making Care assumed at 3 PM.  Patient is here with shortness of breath.  Patient is on 2 L nasal cannula at baseline.  Patient was hypoxic to 78% and put on 4 L.  Patient received nebs and steroids by EMS.  Signout pending labs and chest x-ray and admission  7:25 PM Patient still tachypneic to about 25.  Initial pH was 7.2.  CBC CMP unremarkable.  Patient is positive for RSV.  Chest x-ray did not show any pneumonia.  At this point hospitalist to admit for RSV bronchiolitis.  Problems Addressed: RSV bronchiolitis: acute illness or injury  Amount and/or Complexity of Data Reviewed Labs: ordered. Decision-making details documented in ED Course. Radiology: ordered and independent interpretation performed. Decision-making details documented in ED Course. ECG/medicine tests: ordered and independent interpretation performed. Decision-making details documented in ED Course.  Risk Prescription drug management. Decision regarding hospitalization.          Charlynne Pander, MD 08/05/23 Barnie Mort    Charlynne Pander, MD 08/05/23 2002

## 2023-08-05 NOTE — ED Provider Notes (Signed)
[place] EMERGENCY DEPARTMENT AT [facility] Provider Note   CSN: [id number] Arrival date & time: 08/05/23  1242     History {Add pertinent medical, surgical, social history, OB history to HPI:1} No chief complaint on file.   Sean Pope is a 78 y.o. male.  HPI   This patient is a 78 year old male, history of emphysema on Breztri which she takes morning at night, albuterol as needed, presents with about 1 week of progressive difficulty breathing, he feels like he has been coughing worse, more short of breath and on his home 2 L was 78% when the paramedics found him.  On 4 L and with 2 DuoNebs and steroids he is better but still breathing very hard.  He denies fevers vomiting or diarrhea, he has chronic swelling of his lower extremities and does report a history of bilateral lower extremity edema related to some renal insufficiency.  He does take Lasix, most recently documented at 80 mg twice a day, he is on blood pressure medications including chlorthalidone.  Review of the medical record shows that the patient had been seen for a transient ischemic attack in September 2024, followed with the pulmonology office, has a history of ulcerative chronic pancolitis for which she takes mercaptopurine  Home Medications Prior to Admission medications   Medication Sig Start Date End Date Taking? Authorizing Provider  aspirin EC 81 MG tablet Take 1 tablet (81 mg total) by mouth daily. Swallow whole. 02/25/23   Glade Lloyd, MD  atorvastatin (LIPITOR) 40 MG tablet Take 1 tablet (40 mg total) by mouth daily. 02/24/23 02/24/24  Glade Lloyd, MD  BREZTRI AEROSPHERE 160-9-4.8 MCG/ACT AERO INHALE 2 PUFFS INTO THE LUNGS IN THE MORNING AND AT BEDTIME. 02/06/23   Mliss Sax, MD  chlorthalidone (HYGROTON) 25 MG tablet TAKE 1/2 TABLET BY MOUTH EVERY DAY 07/08/23   Mliss Sax, MD  Cholecalciferol (VITAMIN D3) 50 MCG (2000 UT) TABS Take 50 Units by mouth daily.    [provider]  Ferrous Sulfate (IRON) 325 (65 FE) MG TABS Take 65 mg by mouth 2 (two) times daily.    [provider]  fluorouracil (EFUDEX) 5 % cream Apply topically daily. 12/24/22   [provider]  furosemide (LASIX) 80 MG tablet Take 80 mg by mouth 2 (two) times daily. 01/07/23   [provider]  gabapentin (NEURONTIN) 100 MG capsule Take 1 each morning and again in the mid afternoon and then 2 at night. 02/19/23   Mliss Sax, MD  guaiFENesin (MUCINEX) 600 MG 12 hr tablet Take 1 tablet (600 mg total) by mouth 2 (two) times daily as needed. 05/20/23   Mliss Sax, MD  mercaptopurine (PURINETHOL) 50 MG tablet TAKE 1 AND 1/2 TABLETS BY MOUTH DAILY 02/25/23   Iva Boop, MD  Multiple Vitamins-Minerals (MENS MULTIVITAMIN PLUS PO) Take 1 tablet by mouth. Centrum Silver    [provider]  vitamin B-12 (CYANOCOBALAMIN) 1000 MCG tablet Take 1,000 mcg by mouth daily.    [provider]      Allergies    Patient has no known allergies.    Review of Systems   Review of Systems  All other systems reviewed and are negative.   Physical Exam Updated Vital Signs There were no vitals taken for this visit. Physical Exam Vitals and nursing note reviewed.  Constitutional:      General: He is in acute distress.     Appearance: He is well-developed. He  is ill-appearing and toxic-appearing.  HENT:     Head: Normocephalic and atraumatic.     Mouth/Throat:     Pharynx: No oropharyngeal exudate.  Eyes:     General: No scleral icterus.       Right eye: No discharge.        Left eye: No discharge.     Conjunctiva/sclera: Conjunctivae normal.     Pupils: Pupils are equal, round, and reactive to light.  Neck:     Thyroid: No thyromegaly.     Vascular: No JVD.  Cardiovascular:     Rate and Rhythm: Normal rate and regular rhythm.     Heart sounds: Normal heart sounds. No murmur heard.    No friction rub. No gallop.  Pulmonary:      Effort: Respiratory distress present.     Breath sounds: Wheezing and rhonchi present. No rales.  Abdominal:     General: Bowel sounds are normal. There is no distension.     Palpations: Abdomen is soft. There is no mass.     Tenderness: There is no abdominal tenderness.  Musculoskeletal:        General: No tenderness. Normal range of motion.     Cervical back: Normal range of motion and neck supple.     Right lower leg: Edema present.     Left lower leg: Edema present.     Comments: 2+ edema at the ankles, symmetrical  Lymphadenopathy:     Cervical: No cervical adenopathy.  Skin:    General: Skin is warm and dry.     Findings: No erythema or rash.  Neurological:     Mental Status: He is alert.     Coordination: Coordination normal.  Psychiatric:        Behavior: Behavior normal.     ED Results / Procedures / Treatments   Labs (all labs ordered are listed, but only abnormal results are displayed) Labs Reviewed - No data to display  EKG None  Radiology No results found.  Procedures Procedures  {Document cardiac monitor, telemetry assessment procedure when appropriate:1}  Medications Ordered in ED Medications  albuterol (PROVENTIL,VENTOLIN) solution continuous neb (has no administration in time range)  ipratropium-albuterol (DUONEB) 0.5-2.5 (3) MG/3ML nebulizer solution 3 mL (has no administration in time range)  magnesium sulfate IVPB 2 g 50 mL (has no administration in time range)    ED Course/ Medical Decision Making/ A&P   {   Click here for ABCD2, HEART and other calculatorsREFRESH Note before signing :1}                              Medical Decision Making Amount and/or Complexity of Data Reviewed Labs: ordered. Radiology: ordered. ECG/medicine tests: ordered.  Risk Prescription drug management.    This patient presents to the ED for concern of shortness of breath, this involves an extensive number of treatment options, and is a complaint that  carries with it a high risk of complications and morbidity.  The differential diagnosis includes infection, sepsis, pneumonia, pneumothorax, COPD exacerbation, bronchitis, COVID or flu   Co morbidities that complicate the patient evaluation  Known and for LIMA, oxygen dependent on 2 L   Additional history obtained:  Additional history obtained from record External records from outside source obtained and reviewed including prior admissions and evaluations in the ER and pulmonary office   Lab Tests:  I Ordered, and personally interpreted labs.  The pertinent results include:  ***  Imaging Studies ordered:  I ordered imaging studies including ***  I independently visualized and interpreted imaging which showed *** I agree with the radiologist interpretation   Cardiac Monitoring: / EKG:  The patient was maintained on a cardiac monitor.  I personally viewed and interpreted the cardiac monitored which showed an underlying rhythm of: ***   Consultations Obtained:  I requested consultation with the ***,  and discussed lab and imaging findings as well as pertinent plan - they recommend: ***   Problem List / ED Course / Critical interventions / Medication management  *** I ordered medication including ***  for ***  Reevaluation of the patient after these medicines showed that the patient {resolved/improved/worsened:23923::"improved"} I have reviewed the patients home medicines and have made adjustments as needed   Social Determinants of Health:  ***   Test / Admission - Considered:  ***   {Document critical care time when appropriate:1} {Document review of labs and clinical decision tools ie heart score, Chads2Vasc2 etc:1}  {Document your independent review of radiology images, and any outside records:1} {Document your discussion with family members, caretakers, and with consultants:1} {Document social determinants of health affecting pt's care:1} {Document your  decision making why or why not admission, treatments were needed:1} Final Clinical Impression(s) / ED Diagnoses Final diagnoses:  None    Rx / DC Orders ED Discharge Orders     None

## 2023-08-05 NOTE — ED Triage Notes (Signed)
Pt bib ems from home c/o sob 78% on 2L nasal [place]. Pt was increased to 4L 100% with EMS. Pt recently seen his PCP that dx emphysema and a cold. Pt has been taking mucinex.  Daily inhaler use  EMS noted Rhonchi/diminished lung sounds  2 duonebs with some improvement.   BP 140/60 HR 130

## 2023-08-05 NOTE — H&P (Signed)
History and Physical    Sean Pope ZDG:[id number] DOB: March 17, 1946 DOA: 08/05/2023  PCP: Mliss Sax, MD  Patient coming from: Home  Chief Complaint: Shortness of breath  HPI: Sean Pope is a 78 y.o. male with medical history significant of COPD/emphysema, chronic hypoxemic respiratory failure, hyperlipidemia, prediabetes, TIA, intracranial aneurysm status post coiling in 2013, ulcerative colitis, CKD stage IV, anemia of chronic disease, GERD presented to the ED via EMS for evaluation of shortness of breath and cough.  He was satting 78% on 2 L [place] on EMS arrival, increased to 4 L and sats improved to 100%.  He was given 2 DuoNebs and Solu-Medrol prior to arrival.  Afebrile.  Labs showing no leukocytosis, hemoglobin 10.5 (close to baseline), creatinine 2.7 (baseline 2.2), RSV PCR positive, VBG with pH 7.20 and pCO2 68.3.  EKG without acute ischemic changes.  Chest x-ray showing no active disease. Patient was given additional DuoNeb treatment and IV mag 2 g.  TRH called to admit.  Patient states he had COVID 2 years ago and since then has been on home oxygen.  For the past 5 days he is having worsening shortness of breath, cough, and slight wheezing.  He uses 2 L home oxygen.  His appetite has been poor.  No fevers, chills, chest pain, vomiting, abdominal pain, or diarrhea.  He takes Lasix at home for chronic bilateral lower extremity edema.  Review of Systems:  Review of Systems  All other systems reviewed and are negative.   Past Medical History:  Diagnosis Date   Acute respiratory failure with hypoxia (HCC) 09/17/2011   Acute respiratory failure with hypoxia and hypercapnia (HCC) 11/15/2020   AKI (acute kidney injury) (HCC)    In the setting of ulcerative colitis flare, question relationship to mesalamine   Anal fissure - posterior 01/22/2014   Anemia-multifactorial 01/05/2014   Blood loss, medications, chronic illness kidney injury   ARF (acute renal failure) (HCC)     B12 deficiency - borderline 12/21/2014   C. difficile colitis 06/02/2014   Chronic kidney disease, stage III (moderate) (HCC) 09/08/2013   Emphysema lung (HCC)    GERD (gastroesophageal reflux disease)    Hypercalcemia    Hyperlipidemia    Hypertension    Latent tuberculosis    Long-term use of immunosuppressant medication-mercaptopurine 01/05/2014   Lung collapse 09/26/2011   Osteoporosis 06/29/2014   DEXA 06/2014 - lowest t score -4.2   Pneumonia due to COVID-19 virus 11/15/2020   Posterior communicating artery aneurysm 09/17/2011   Protein-calorie malnutrition, severe (HCC) 10/26/2013   Respiratory failure, acute (HCC) 01/07/2014   SAH (subarachnoid hemorrhage) (HCC)    Stroke (HCC)    Ulcerative colitis (HCC) 05/28/2013   Vitamin D deficiency 06/03/2014    Past Surgical History:  Procedure Laterality Date   ANEURYSM COILING  2013   Posterior communicating   BIOPSY  11/06/2022   Procedure: BIOPSY;  Surgeon: Iva Boop, MD;  Location: [facility];  Service: Gastroenterology;;   CATARACT EXTRACTION Bilateral aug, sept 2016   COLONOSCOPY W/ BIOPSIES  05/28/2013   COLONOSCOPY WITH PROPOFOL N/A 11/06/2022   Procedure: COLONOSCOPY WITH PROPOFOL;  Surgeon: Iva Boop, MD;  Location: [facility];  Service: Gastroenterology;  Laterality: N/A;   IR ANGIO INTRA EXTRACRAN SEL INTERNAL CAROTID BILAT MOD SED  10/22/2017   IR ANGIO VERTEBRAL SEL VERTEBRAL UNI L MOD SED  10/22/2017     reports that he quit smoking about 12 years ago. His smoking use included cigarettes. He  started smoking about 62 years ago. He has a 25 pack-year smoking history. He has quit using smokeless tobacco. He reports that he does not currently use alcohol. He reports that he does not use drugs.  No Known Allergies  Family History  Problem Relation Age of Onset   Heart disease Mother     Prior to Admission medications   Medication Sig Start Date End Date Taking? Authorizing Provider  atorvastatin  (LIPITOR) 40 MG tablet Take 1 tablet (40 mg total) by mouth daily. 02/24/23 02/24/24 Yes Glade Lloyd, MD  BREZTRI AEROSPHERE 160-9-4.8 MCG/ACT AERO INHALE 2 PUFFS INTO THE LUNGS IN THE MORNING AND AT BEDTIME. 02/06/23  Yes Mliss Sax, MD  chlorthalidone (HYGROTON) 25 MG tablet TAKE 1/2 TABLET BY MOUTH EVERY DAY 07/08/23  Yes Mliss Sax, MD  Cholecalciferol (VITAMIN D3) 50 MCG (2000 UT) TABS Take 50 Units by mouth daily.   Yes [provider]  Ferrous Sulfate (IRON) 325 (65 FE) MG TABS Take 65 mg by mouth 2 (two) times daily.   Yes [provider]  fluorouracil (EFUDEX) 5 % cream Apply topically daily. 12/24/22  Yes [provider]  furosemide (LASIX) 80 MG tablet Take 80 mg by mouth 2 (two) times daily. 01/07/23  Yes [provider]  gabapentin (NEURONTIN) 100 MG capsule Take 1 each morning and again in the mid afternoon and then 2 at night. Patient taking differently: Take 100 mg by mouth 2 (two) times daily. Take 1 each morning and again in the mid afternoon and then 2 at night. 02/19/23  Yes Mliss Sax, MD  guaiFENesin (MUCINEX) 600 MG 12 hr tablet Take 1 tablet (600 mg total) by mouth 2 (two) times daily as needed. 05/20/23  Yes Mliss Sax, MD  mercaptopurine (PURINETHOL) 50 MG tablet TAKE 1 AND 1/2 TABLETS BY MOUTH DAILY 02/25/23  Yes Iva Boop, MD  Multiple Vitamins-Minerals (MENS MULTIVITAMIN PLUS PO) Take 1 tablet by mouth. Centrum Silver   Yes [provider]  vitamin B-12 (CYANOCOBALAMIN) 1000 MCG tablet Take 1,000 mcg by mouth daily.   Yes [provider]  aspirin EC 81 MG tablet Take 1 tablet (81 mg total) by mouth daily. Swallow whole. Patient not taking: Reported on 08/05/2023 02/25/23   Glade Lloyd, MD    Physical Exam: Vitals:   08/05/23 1745 08/05/23 1815 08/05/23 1830 08/05/23 1915  BP: 104/61 (!) 126/52 (!) 110/55 125/62  Pulse: 86 93 85 (!) 29  Resp: (!) 25 (!) 25 (!) 25 (!) 25   Temp:      TempSrc:      SpO2: 100% 97% 100% (!) 84%    Physical Exam Vitals reviewed.  Constitutional:      General: He is not in acute distress. HENT:     Head: Normocephalic and atraumatic.  Eyes:     Extraocular Movements: Extraocular movements intact.  Cardiovascular:     Rate and Rhythm: Normal rate and regular rhythm.     Pulses: Normal pulses.  Pulmonary:     Comments: Slightly tachypneic Speaking clearly in full sentences Diminished breath sounds bilaterally with mild end expiratory wheezing Abdominal:     General: Bowel sounds are normal. There is no distension.     Palpations: Abdomen is soft.     Tenderness: There is no abdominal tenderness. There is no guarding.  Musculoskeletal:     Cervical back: Normal range of motion.     Right lower leg: Edema present.  Left lower leg: Edema present.     Comments: 2+ pitting edema of bilateral lower legs  Skin:    General: Skin is warm and dry.  Neurological:     General: No focal deficit present.     Mental Status: He is alert and oriented to person, place, and time.     Labs on Admission: I have personally reviewed following labs and imaging studies  CBC: Recent Labs  Lab 08/05/23 1510 08/05/23 1517  WBC 6.1  --   NEUTROABS 5.9  --   HGB 10.5* 11.2*  HCT 33.1* 33.0*  MCV 114.9*  --   PLT 258  --    Basic Metabolic Panel: Recent Labs  Lab 08/05/23 1510 08/05/23 1517  NA 145 143  K 4.4 4.3  CL 105  --   CO2 25  --   GLUCOSE 116*  --   BUN 44*  --   CREATININE 2.77*  --   CALCIUM 9.1  --    GFR: CrCl cannot be calculated (Unknown ideal weight.). Liver Function Tests: Recent Labs  Lab 08/05/23 1510  AST 21  ALT 16  ALKPHOS 68  BILITOT 1.0  PROT 6.8  ALBUMIN 3.9   No results for input(s): "LIPASE", "AMYLASE" in the last 168 hours. No results for input(s): "AMMONIA" in the last 168 hours. Coagulation Profile: No results for input(s): "INR", "PROTIME" in the last 168 hours. Cardiac  Enzymes: No results for input(s): "CKTOTAL", "CKMB", "CKMBINDEX", "TROPONINI" in the last 168 hours. BNP (last 3 results) No results for input(s): "PROBNP" in the last 8760 hours. HbA1C: No results for input(s): "HGBA1C" in the last 72 hours. CBG: No results for input(s): "GLUCAP" in the last 168 hours. Lipid Profile: No results for input(s): "CHOL", "HDL", "LDLCALC", "TRIG", "CHOLHDL", "LDLDIRECT" in the last 72 hours. Thyroid Function Tests: No results for input(s): "TSH", "T4TOTAL", "FREET4", "T3FREE", "THYROIDAB" in the last 72 hours. Anemia Panel: No results for input(s): "VITAMINB12", "FOLATE", "FERRITIN", "TIBC", "IRON", "RETICCTPCT" in the last 72 hours. Urine analysis:    Component Value Date/Time   COLORURINE YELLOW 01/08/2014 0120   APPEARANCEUR CLOUDY (A) 01/08/2014 0120   LABSPEC 1.020 01/08/2014 0120   PHURINE 5.5 01/08/2014 0120   GLUCOSEU NEGATIVE 01/08/2014 0120   GLUCOSEU NEGATIVE 10/20/2013 0855   HGBUR NEGATIVE 01/08/2014 0120   BILIRUBINUR NEGATIVE 01/08/2014 0120   KETONESUR NEGATIVE 01/08/2014 0120   PROTEINUR NEGATIVE 01/08/2014 0120   UROBILINOGEN 0.2 01/08/2014 0120   NITRITE NEGATIVE 01/08/2014 0120   LEUKOCYTESUR NEGATIVE 01/08/2014 0120    Radiological Exams on Admission: DG Chest Port 1 View Result Date: 08/05/2023 CLINICAL DATA:  Cough.  COPD. EXAM: PORTABLE CHEST 1 VIEW COMPARISON:  Chest radiograph dated 11/15/2020. FINDINGS: Background of emphysema. No focal consolidation, pleural effusion, or pneumothorax. Right perihilar atelectasis/scarring. The cardiac silhouette is within normal limits. No acute osseous pathology. IMPRESSION: 1. No active disease. 2. Emphysema. Electronically Signed   By: Elgie Collard M.D.   On: 08/05/2023 15:55    EKG: Independently reviewed.  Sinus rhythm, baseline wander.  No acute ischemic changes.  Assessment and Plan  Acute COPD exacerbation in the setting of RSV infection Acute on chronic hypoxemic  respiratory failure Patient received DuoNeb x 2 and Solu-Medrol by EMS.  Received additional DuoNeb treatment and IV mag in the ED.  Currently slightly tachypneic with respiratory rate in the mid 20s but satting well on 2.5 L [place].  No fever or leukocytosis.  Chest x-ray not suggestive of pneumonia.  Continue  treatment with Solu-Medrol 40 mg every 12 hours, DuoNeb every 6 hours, Pulmicort neb twice daily, albuterol neb every 4 hours as needed, Mucinex as needed, and flutter valve.  Droplet and contact precautions.  Continue supplemental oxygen.  AKI on CKD stage IV Likely prerenal from poor p.o. intake/dehydration in setting of acute illness.  Creatinine 2.7 (baseline 2.2).  Start IV fluid hydration and repeat labs in the morning.  Avoid nephrotoxic agents/hold home chlorthalidone and Lasix  Hyperlipidemia History of TIA Continue Lipitor.  Ulcerative colitis Stable, no acute complaints.  Given AKI and RSV infection, pharmacist recommending holding his home medication mercaptopurine dose for tomorrow and then reevaluate.  Chronic macrocytic anemia Hemoglobin close to baseline and no signs of bleeding.  Chronic pain/neuropathy Continue gabapentin.  DVT prophylaxis: SQ Heparin Code Status: DNR/DNI (discussed with the patient) Level of care: Progressive Care Unit Admission status: It is my clinical opinion that admission to INPATIENT is reasonable and necessary because of the expectation that this patient will require hospital care that crosses at least 2 midnights to treat this condition based on the medical complexity of the problems presented.  Given the aforementioned information, the predictability of an adverse outcome is felt to be significant.  John Giovanni MD [facility]  If 7PM-7AM, please contact night-coverage [email or web address]  08/05/2023, 7:34 PM

## 2023-08-06 DIAGNOSIS — N184 Chronic kidney disease, stage 4 (severe): Secondary | ICD-10-CM

## 2023-08-06 DIAGNOSIS — B338 Other specified viral diseases: Secondary | ICD-10-CM | POA: Diagnosis not present

## 2023-08-06 DIAGNOSIS — N179 Acute kidney failure, unspecified: Secondary | ICD-10-CM

## 2023-08-06 DIAGNOSIS — I10 Essential (primary) hypertension: Secondary | ICD-10-CM

## 2023-08-06 DIAGNOSIS — D649 Anemia, unspecified: Secondary | ICD-10-CM | POA: Diagnosis not present

## 2023-08-06 DIAGNOSIS — E785 Hyperlipidemia, unspecified: Secondary | ICD-10-CM

## 2023-08-06 DIAGNOSIS — K51919 Ulcerative colitis, unspecified with unspecified complications: Secondary | ICD-10-CM

## 2023-08-06 DIAGNOSIS — J441 Chronic obstructive pulmonary disease with (acute) exacerbation: Secondary | ICD-10-CM | POA: Diagnosis not present

## 2023-08-06 LAB — CBC
HCT: 31.8 % — ABNORMAL LOW (ref 39.0–52.0)
HCT: 28.1 % — ABNORMAL LOW (ref 39.0–52.0)
Hemoglobin: 9.9 g/dL — ABNORMAL LOW (ref 13.0–17.0)
Hemoglobin: 8.6 g/dL — ABNORMAL LOW (ref 13.0–17.0)
MCH: 35.4 pg — ABNORMAL HIGH (ref 26.0–34.0)
MCH: 35.1 pg — ABNORMAL HIGH (ref 26.0–34.0)
MCHC: 31.1 g/dL (ref 30.0–36.0)
MCHC: 30.6 g/dL (ref 30.0–36.0)
MCV: 113.6 fL — ABNORMAL HIGH (ref 80.0–100.0)
MCV: 114.7 fL — ABNORMAL HIGH (ref 80.0–100.0)
Platelets: 250 10*3/uL (ref 150–400)
Platelets: 209 10*3/uL (ref 150–400)
RBC: 2.8 MIL/uL — ABNORMAL LOW (ref 4.22–5.81)
RBC: 2.45 MIL/uL — ABNORMAL LOW (ref 4.22–5.81)
RDW: 17.9 % — ABNORMAL HIGH (ref 11.5–15.5)
RDW: 17.8 % — ABNORMAL HIGH (ref 11.5–15.5)
WBC: 5.1 10*3/uL (ref 4.0–10.5)
WBC: 5.4 10*3/uL (ref 4.0–10.5)
nRBC: 0.8 % — ABNORMAL HIGH (ref 0.0–0.2)
nRBC: 0.9 % — ABNORMAL HIGH (ref 0.0–0.2)

## 2023-08-06 LAB — BASIC METABOLIC PANEL
Anion gap: 10 (ref 5–15)
BUN: 52 mg/dL — ABNORMAL HIGH (ref 8–23)
CO2: 23 mmol/L (ref 22–32)
Calcium: 8.1 mg/dL — ABNORMAL LOW (ref 8.9–10.3)
Chloride: 107 mmol/L (ref 98–111)
Creatinine, Ser: 2.88 mg/dL — ABNORMAL HIGH (ref 0.61–1.24)
GFR, Estimated: 22 mL/min — ABNORMAL LOW (ref >60)
Glucose, Bld: 176 mg/dL — ABNORMAL HIGH (ref 70–99)
Potassium: 4.4 mmol/L (ref 3.5–5.1)
Sodium: 140 mmol/L (ref 135–145)

## 2023-08-06 LAB — CREATININE, SERUM
Creatinine, Ser: 3.12 mg/dL — ABNORMAL HIGH (ref 0.61–1.24)
GFR, Estimated: 20 mL/min — ABNORMAL LOW (ref >60)

## 2023-08-06 LAB — FOLATE: Folate: >40 ng/mL (ref >5.9)

## 2023-08-06 LAB — VITAMIN B12: Vitamin B-12: 2651 pg/mL — ABNORMAL HIGH (ref 180–914)

## 2023-08-06 LAB — RETICULOCYTES
Immature Retic Fract: 11.2 % (ref 2.3–15.9)
RBC.: 2.45 MIL/uL — ABNORMAL LOW (ref 4.22–5.81)
Retic Count, Absolute: 21.6 10*3/uL (ref 19.0–186.0)
Retic Ct Pct: 0.9 % (ref 0.4–3.1)

## 2023-08-06 LAB — FERRITIN: Ferritin: 277 ng/mL (ref 24–336)

## 2023-08-06 LAB — IRON AND TIBC
Iron: 39 ug/dL — ABNORMAL LOW (ref 45–182)
Saturation Ratios: 22 % (ref 17.9–39.5)
TIBC: 181 ug/dL — ABNORMAL LOW (ref 250–450)
UIBC: 142 ug/dL

## 2023-08-06 MED ORDER — AZITHROMYCIN 500 MG PO TABS
500.0000 mg | ORAL_TABLET | Freq: Every day | ORAL | Status: AC
  Administered 2023-08-06: 500 mg via ORAL
  Filled 2023-08-06: qty 1

## 2023-08-06 MED ORDER — FERROUS SULFATE 325 (65 FE) MG PO TABS
325.0000 mg | ORAL_TABLET | Freq: Two times a day (BID) | ORAL | Status: DC
  Administered 2023-08-06 – 2023-08-08 (×5): 325 mg via ORAL
  Filled 2023-08-06 (×5): qty 1

## 2023-08-06 MED ORDER — VITAMIN B-12 1000 MCG PO TABS
1000.0000 ug | ORAL_TABLET | Freq: Every day | ORAL | Status: DC
  Administered 2023-08-06 – 2023-08-08 (×3): 1000 ug via ORAL
  Filled 2023-08-06 (×3): qty 1

## 2023-08-06 MED ORDER — LACTATED RINGERS IV SOLN: INTRAVENOUS | Status: AC

## 2023-08-06 MED ORDER — AZITHROMYCIN 250 MG PO TABS
250.0000 mg | ORAL_TABLET | Freq: Every day | ORAL | Status: DC
  Administered 2023-08-07 – 2023-08-08 (×2): 250 mg via ORAL
  Filled 2023-08-06 (×3): qty 1

## 2023-08-06 MED ORDER — VITAMIN D 25 MCG (1000 UNIT) PO TABS
2000.0000 [IU] | ORAL_TABLET | Freq: Every day | ORAL | Status: DC
  Administered 2023-08-06 – 2023-08-08 (×3): 2000 [IU] via ORAL
  Filled 2023-08-06 (×3): qty 2

## 2023-08-06 NOTE — ED Notes (Signed)
Spo2-99-100% on 4L [place], switched to 2L [place] per Dr. Nelson Chimes and SpO2 remains 99-100%.

## 2023-08-06 NOTE — Hospital Course (Addendum)
Taken from H&P.   Sean Pope is a 78 y.o. male with medical history significant of COPD/emphysema, chronic hypoxemic respiratory failure on 2 L at home, hyperlipidemia, prediabetes, TIA, intracranial aneurysm status post coiling in 2013, ulcerative colitis, CKD stage IV, anemia of chronic disease, GERD presented to the ED via EMS for evaluation of shortness of breath and cough.  He was satting 78% on 2 L  on EMS arrival, increased to 4 L and sats improved to 100%.  He was given 2 DuoNebs and Solu-Medrol prior to arrival.   On arrival vital stable, afebrile, CBC at baseline, no leukocytosis, creatinine 2.7 with baseline of 2.2, RSV PCR positive, VBG with pH 7.20 and pCO2 68.3. EKG without acute ischemic changes. Chest x-ray showing no active disease.   2/18: Vital stable on 4 L.  Labs with worsening of hemoglobin to 8.6 without any obvious bleeding, creatinine slowly worsening at 2.88, giving some IV fluid. Anemia panel consistent with anemia of chronic disease. Holding home Lasix and chlorthalidone today as he appears dry.

## 2023-08-06 NOTE — Assessment & Plan Note (Signed)
 -  Continue Lipitor

## 2023-08-06 NOTE — Assessment & Plan Note (Signed)
Creatinine with slight worsening at 2.88 today.  Clinically appears dry.  Uses chlorthalidone and Lasix at home. -Holding diuretics -Giving some IV fluid -Monitor renal function -Avoid nephrotoxins

## 2023-08-06 NOTE — Progress Notes (Signed)
Progress Note   Patient: Sean Pope FAO:[id number] DOB: September 12, 1945 DOA: 08/05/2023     1 DOS: the patient was seen and examined on 08/06/2023   Brief hospital course: Taken from H&P.   Sean Pope is a 78 y.o. male with medical history significant of COPD/emphysema, chronic hypoxemic respiratory failure on 2 L at home, hyperlipidemia, prediabetes, TIA, intracranial aneurysm status post coiling in 2013, ulcerative colitis, CKD stage IV, anemia of chronic disease, GERD presented to the ED via EMS for evaluation of shortness of breath and cough.  He was satting 78% on 2 L [place] on EMS arrival, increased to 4 L and sats improved to 100%.  He was given 2 DuoNebs and Solu-Medrol prior to arrival.   On arrival vital stable, afebrile, CBC at baseline, no leukocytosis, creatinine 2.7 with baseline of 2.2, RSV PCR positive, VBG with pH 7.20 and pCO2 68.3. EKG without acute ischemic changes. Chest x-ray showing no active disease.   2/18: Vital stable on 4 L.  Labs with worsening of hemoglobin to 8.6 without any obvious bleeding, creatinine slowly worsening at 2.88, giving some IV fluid. Anemia panel consistent with anemia of chronic disease. Holding home Lasix and chlorthalidone today as he appears dry.  Assessment and Plan: * COPD with acute exacerbation (HCC) Acute on chronic hypoxic respiratory failure. Secondary to RSV infection. Clinically seems improving with less wheezing, continue to have mildly increased work of breathing.  Currently at 4 L of oxygen -Continue with Solu-Medrol -Added Zithromax -Continue with supplemental oxygen-wean back to baseline as tolerated -Continue with supportive care   Acute renal failure superimposed on stage 4 chronic kidney disease (HCC) Creatinine with slight worsening at 2.88 today.  Clinically appears dry.  Uses chlorthalidone and Lasix at home. -Holding diuretics -Giving some IV fluid -Monitor renal function -Avoid nephrotoxins  Chronic  anemia History of chronic anemia with macrocytosis Hemoglobin decreased to 8.6 this morning without any obvious bleeding.  Anemia panel consistent consistent with anemia of chronic disease. -Monitor hemoglobin -Transfuse if below 7  Ulcerative colitis (HCC) Stable, no acute complaints. Given AKI and RSV infection, pharmacist recommending holding his home medication mercaptopurine dose for tomorrow and then reevaluate.   Essential hypertension Blood pressure currently within goal. -Holding home chlorthalidone  Hyperlipidemia -Continue Lipitor   Subjective: Patient was still feeling short of breath when seen today.  No obvious bleeding, no melena or bleeding per rectum or in urine.  Physical Exam: Vitals:   08/06/23 1000 08/06/23 1007 08/06/23 1115 08/06/23 1300  BP: (!) 114/56   113/74  Pulse: 95  82 90  Resp: (!) 24  (!) 26 (!) 21  Temp:  98.1 F (36.7 C)  98 F (36.7 C)  TempSrc:  Oral  Oral  SpO2: 96%  97% 94%   General.  Frail and and malnourished elderly man, in no acute distress. Pulmonary.  Lungs clear bilaterally, normal respiratory effort. CV.  Regular rate and rhythm, no JVD, rub or murmur. Abdomen.  Soft, nontender, nondistended, BS positive. CNS.  Alert and oriented .  No focal neurologic deficit. Extremities.  No edema, no cyanosis, pulses intact and symmetrical.  Data Reviewed: Prior data reviewed  Family Communication: Discussed with son at bedside  Disposition: Status is: Inpatient Remains inpatient appropriate because: Severity of illness  Planned Discharge Destination: Home  DVT prophylaxis.  Subcu heparin Time spent: 50 minutes  This record has been created using Conservation officer, historic buildings. Errors have been sought and corrected,but may not always be  located. Such creation errors do not reflect on the standard of care.   Author: Arnetha Courser, MD 08/06/2023 1:38 PM  For on call review [email or web address].

## 2023-08-06 NOTE — Assessment & Plan Note (Signed)
Blood pressure currently within goal. -Holding home chlorthalidone

## 2023-08-06 NOTE — Plan of Care (Signed)

## 2023-08-06 NOTE — Assessment & Plan Note (Signed)
Acute on chronic hypoxic respiratory failure. Secondary to RSV infection. Clinically seems improving with less wheezing, continue to have mildly increased work of breathing.  Currently at 4 L of oxygen -Continue with Solu-Medrol -Added Zithromax -Continue with supplemental oxygen-wean back to baseline as tolerated -Continue with supportive care

## 2023-08-06 NOTE — Assessment & Plan Note (Signed)
History of chronic anemia with macrocytosis Hemoglobin decreased to 8.6 this morning without any obvious bleeding.  Anemia panel consistent consistent with anemia of chronic disease. -Monitor hemoglobin -Transfuse if below 7

## 2023-08-06 NOTE — Assessment & Plan Note (Signed)
Stable, no acute complaints. Given AKI and RSV infection, pharmacist recommending holding his home medication mercaptopurine dose for tomorrow and then reevaluate.

## 2023-08-07 DIAGNOSIS — B338 Other specified viral diseases: Secondary | ICD-10-CM | POA: Diagnosis not present

## 2023-08-07 DIAGNOSIS — N179 Acute kidney failure, unspecified: Secondary | ICD-10-CM | POA: Diagnosis not present

## 2023-08-07 DIAGNOSIS — J441 Chronic obstructive pulmonary disease with (acute) exacerbation: Secondary | ICD-10-CM | POA: Diagnosis not present

## 2023-08-07 DIAGNOSIS — K51919 Ulcerative colitis, unspecified with unspecified complications: Secondary | ICD-10-CM | POA: Diagnosis not present

## 2023-08-07 LAB — BLOOD GAS, ARTERIAL
Acid-base deficit: 5 mmol/L — ABNORMAL HIGH (ref 0.0–2.0)
Bicarbonate: 27 mmol/L (ref 20.0–28.0)
O2 Saturation: 100 %
Patient temperature: 36.7
pCO2 arterial: 88 mm[Hg] (ref 32–48)
pH, Arterial: 7.09 — CL (ref 7.35–7.45)
pO2, Arterial: 145 mm[Hg] — ABNORMAL HIGH (ref 83–108)

## 2023-08-07 LAB — GLUCOSE, CAPILLARY: Glucose-Capillary: 157 mg/dL — ABNORMAL HIGH (ref 70–99)

## 2023-08-07 NOTE — Progress Notes (Signed)
Latest Reference Range & Units 08/07/23 19:35  pH, Arterial 7.35 - 7.45  7.09 (LL)  pCO2 arterial 32 - 48 mmHg 88 (HH)  pO2, Arterial 83 - 108 mmHg 145 (H)  (LL): Data is critically low [facility]): Data is critically high (H): Data is abnormally high     Continuos CPaP ordered

## 2023-08-07 NOTE — Progress Notes (Signed)
Brief Note: Daughter at bedside had some concerns about confusion/strokelike symptoms-I examined the patient-is completely awake/alert, no facial droop-nonfocal exam-5/5 in all 4 extremities.  When asked further-daughter claims that she is concerned because he texted her at night-and also texted her brother last evening-and has no recollection.  I explained he likely has delirium rather than any strokelike symptoms.  She was very tearful during our interaction-I furthermore explained that the I did not think the patient had a stroke-and that the only way to rule 1 out at this point was doing MRI of the brain.  Upon reviewing the chart-patient  had a TIA September 2021-MRI was negative.  After further discussion-I had offered to do an MRI of the brain even though I thought it would be low yield-but the daughter wants me to hold off on it for now.

## 2023-08-07 NOTE — Plan of Care (Signed)

## 2023-08-07 NOTE — Progress Notes (Signed)
   08/07/23 0930  Mobility  Activity Moved into chair position in bed  Level of Assistance Standby assist, set-up cues, supervision of patient - no hands on  Assistive Device None  Activity Response Tolerated fair  Mobility Referral Yes  Mobility visit 1 Mobility  Mobility Specialist Start Time (ACUTE ONLY) 0911  Mobility Specialist Stop Time (ACUTE ONLY) 0930  Mobility Specialist Time Calculation (min) (ACUTE ONLY) 19 min   Mobility Specialist: Progress Note  Pre-Mobility:   SpO2 84-86% 3L Post-Mobility:  SpO2 100% 4L  Pt agreeable to mobility session - received in bed. C/o "needing air," pt on 3LO2 with desat, tirated to 4L, VSS - RN notified. Pt also with consistent cough.  Pt deferred further mobility despite encouragement to ambulate or sit in the chair. Pt eating breakfast and also unsure on how to work TV remote despite instructional cues. Returned to seated position in bed, sitting upright with all needs met - call bell within reach. Bed alarm on.   Barnie Mort, BS Mobility Specialist Please contact via SecureChat or  Rehab office at [phone].

## 2023-08-07 NOTE — Care Management (Signed)
Late Entry: Patient is very lethargic and unable to stay awake during conversation, this is a change since yesterday. Patient has COPD and has not had a ABG. Physician notified of nursing concerns and ABG was ordered.

## 2023-08-07 NOTE — Progress Notes (Signed)
PROGRESS NOTE        PATIENT DETAILS Name: Sean Pope Age: 78 y.o. Sex: male Date of Birth: 04-18-1946 Admit Date: 08/05/2023 Admitting Physician John Giovanni, MD FAO:[id number], Talmadge Coventry, MD  Brief Summary: Patient is a 78 y.o.  male with history of COPD on home O2, ulcerative colitis, CKD stage IV-presented with  SOB- found to have acute on chronic hypoxic respiratory failure due to COPD exacerbation in a setting of RSV infection  Significant events: 2/17>> admitted [facility]-COPD exacerbation-RSV infection.  Significant studies: 2/17>> CXR: No PNA-Emphysema.  Significant microbiology data: 2/17>> RSV PCR: Positive 2/17>> COVID/influenza PCR: Negative  Procedures: None  Consults: None  Subjective: Feels much better-Down to 3 L of oxygen today. Objective: Vitals: Blood pressure (!) 134/102, pulse 85, temperature 98.1 F (36.7 C), temperature source Oral, resp. rate 18, SpO2 100%.   Exam: Gen Exam:Alert awake-not in any distress HEENT:atraumatic, normocephalic Chest: Some scattered rhonchi. CVS:S1S2 regular Abdomen:soft non tender, non distended Extremities:no edema Neurology: Non focal Skin: no rash  Pertinent Labs/Radiology:    Latest Ref Rng & Units 08/06/2023    5:00 AM 08/06/2023    3:08 AM 08/05/2023    3:17 PM  CBC  WBC 4.0 - 10.5 K/uL 5.4  5.1    Hemoglobin 13.0 - 17.0 g/dL 8.6  9.9  78.4   Hematocrit 39.0 - 52.0 % 28.1  31.8  33.0   Platelets 150 - 400 K/uL 209  250      Lab Results  Component Value Date   NA 140 08/06/2023   K 4.4 08/06/2023   CL 107 08/06/2023   CO2 23 08/06/2023      Assessment/Plan: Acute on chronic hypoxic respiratory failure due to COPD exacerbation in a setting of RSV infection Improved Down to 3 L of oxygen Taper steroids Zithromax x 3 days Bronchodilators Pulmonary toileting Mobilize  AKI on CKD 4 improving-no labs done today Euvolemic on exam Supportive care Avoid  nephrotoxic agents Labs in AM.  Normocytic anemia Likely related to CKD 4-no evidence of blood loss  HTN BP stable  HLD Lipitor  Ulcerative colitis 6-MP on hold given acute infection.  Resume in the next 2 days.   BMI: Estimated body mass index is 23.53 kg/m as calculated from the following:   Height as of 05/20/23: 5\' 10"  (1.778 m).   Weight as of 05/20/23: 74.4 kg.   Code status:   Code Status: Limited: Do not attempt resuscitation (DNR) -DNR-LIMITED -Do Not Intubate/DNI    DVT Prophylaxis: heparin injection 5,000 Units Start: 08/05/23 2200   Family Communication: None at bedside   Disposition Plan: Status is: Inpatient Remains inpatient appropriate because: Severity of illness   Planned Discharge Destination:Home   Diet: Diet Order             Diet Heart Room service appropriate? Yes; Fluid consistency: Thin  Diet effective now                     Antimicrobial agents: Anti-infectives (From admission, onward)    Start     Dose/Rate Route Frequency Ordered Stop   08/07/23 1000  azithromycin (ZITHROMAX) tablet 250 mg       Placed in "Followed by" Linked Group   250 mg Oral Daily 08/06/23 1331 08/11/23 0959   08/06/23 1345  azithromycin (ZITHROMAX) tablet 500 mg  Placed in "Followed by" Linked Group   500 mg Oral Daily 08/06/23 1331 08/06/23 1506        MEDICATIONS: Scheduled Meds:  atorvastatin  40 mg Oral Daily   azithromycin  250 mg Oral Daily   budesonide (PULMICORT) nebulizer solution  0.25 mg Nebulization BID   cholecalciferol  2,000 Units Oral Daily   cyanocobalamin  1,000 mcg Oral Daily   ferrous sulfate  325 mg Oral BID   gabapentin  100 mg Oral BID   heparin  5,000 Units Subcutaneous Q8H   ipratropium-albuterol  3 mL Nebulization Q6H   methylPREDNISolone (SOLU-MEDROL) injection  40 mg Intravenous Q12H   Continuous Infusions: PRN Meds:.acetaminophen **OR** acetaminophen, albuterol, guaiFENesin   I have personally reviewed  following labs and imaging studies  LABORATORY DATA: CBC: Recent Labs  Lab 08/05/23 1510 08/05/23 1517 08/06/23 0308 08/06/23 0500  WBC 6.1  --  5.1 5.4  NEUTROABS 5.9  --   --   --   HGB 10.5* 11.2* 9.9* 8.6*  HCT 33.1* 33.0* 31.8* 28.1*  MCV 114.9*  --  113.6* 114.7*  PLT 258  --  250 209    Basic Metabolic Panel: Recent Labs  Lab 08/05/23 1510 08/05/23 1517 08/06/23 0308 08/06/23 0500  NA 145 143  --  140  K 4.4 4.3  --  4.4  CL 105  --   --  107  CO2 25  --   --  23  GLUCOSE 116*  --   --  176*  BUN 44*  --   --  52*  CREATININE 2.77*  --  3.12* 2.88*  CALCIUM 9.1  --   --  8.1*    GFR: CrCl cannot be calculated (Unknown ideal weight.).  Liver Function Tests: Recent Labs  Lab 08/05/23 1510  AST 21  ALT 16  ALKPHOS 68  BILITOT 1.0  PROT 6.8  ALBUMIN 3.9   No results for input(s): "LIPASE", "AMYLASE" in the last 168 hours. No results for input(s): "AMMONIA" in the last 168 hours.  Coagulation Profile: No results for input(s): "INR", "PROTIME" in the last 168 hours.  Cardiac Enzymes: No results for input(s): "CKTOTAL", "CKMB", "CKMBINDEX", "TROPONINI" in the last 168 hours.  BNP (last 3 results) No results for input(s): "PROBNP" in the last 8760 hours.  Lipid Profile: No results for input(s): "CHOL", "HDL", "LDLCALC", "TRIG", "CHOLHDL", "LDLDIRECT" in the last 72 hours.  Thyroid Function Tests: No results for input(s): "TSH", "T4TOTAL", "FREET4", "T3FREE", "THYROIDAB" in the last 72 hours.  Anemia Panel: Recent Labs    08/06/23 0500  VITAMINB12 2,651*  FOLATE >40.0  FERRITIN 277  TIBC 181*  IRON 39*  RETICCTPCT 0.9    Urine analysis:    Component Value Date/Time   COLORURINE YELLOW 01/08/2014 0120   APPEARANCEUR CLOUDY (A) 01/08/2014 0120   LABSPEC 1.020 01/08/2014 0120   PHURINE 5.5 01/08/2014 0120   GLUCOSEU NEGATIVE 01/08/2014 0120   GLUCOSEU NEGATIVE 10/20/2013 0855   HGBUR NEGATIVE 01/08/2014 0120   BILIRUBINUR NEGATIVE  01/08/2014 0120   KETONESUR NEGATIVE 01/08/2014 0120   PROTEINUR NEGATIVE 01/08/2014 0120   UROBILINOGEN 0.2 01/08/2014 0120   NITRITE NEGATIVE 01/08/2014 0120   LEUKOCYTESUR NEGATIVE 01/08/2014 0120    Sepsis Labs: Lactic Acid, Venous    Component Value Date/Time   LATICACIDVEN 0.7 11/15/2020 1931    MICROBIOLOGY: Recent Results (from the past 240 hours)  Resp panel by RT-PCR (RSV, Flu A&B, Covid) Anterior Nasal Swab     Status: Abnormal  Collection Time: 08/05/23  3:09 PM   Specimen: Anterior Nasal Swab  Result Value Ref Range Status   SARS Coronavirus 2 by RT PCR NEGATIVE NEGATIVE Final   Influenza A by PCR NEGATIVE NEGATIVE Final   Influenza B by PCR NEGATIVE NEGATIVE Final    Comment: (NOTE) The Xpert Xpress SARS-CoV-2/FLU/RSV plus assay is intended as an aid in the diagnosis of influenza from Nasopharyngeal swab specimens and should not be used as a sole basis for treatment. Nasal washings and aspirates are unacceptable for Xpert Xpress SARS-CoV-2/FLU/RSV testing.  Fact Sheet for Patients: BloggerCourse.com  Fact Sheet for Healthcare Providers: SeriousBroker.it  This test is not yet approved or cleared by the [place] FDA and has been authorized for detection and/or diagnosis of SARS-CoV-2 by FDA under an Emergency Use Authorization (EUA). This EUA will remain in effect (meaning this test can be used) for the duration of the COVID-19 declaration under Section 564(b)(1) of the Act, 21 U.S.C. section 360bbb-3(b)(1), unless the authorization is terminated or revoked.     Resp Syncytial Virus by PCR POSITIVE (A) NEGATIVE Final    Comment: (NOTE) Fact Sheet for Patients: BloggerCourse.com  Fact Sheet for Healthcare Providers: SeriousBroker.it  This test is not yet approved or cleared by the [place] FDA and has been authorized for detection and/or  diagnosis of SARS-CoV-2 by FDA under an Emergency Use Authorization (EUA). This EUA will remain in effect (meaning this test can be used) for the duration of the COVID-19 declaration under Section 564(b)(1) of the Act, 21 U.S.C. section 360bbb-3(b)(1), unless the authorization is terminated or revoked.  Performed at [facility], [address], [place], [place] [address]     RADIOLOGY STUDIES/RESULTS: DG Chest Port 1 View Result Date: 08/05/2023 CLINICAL DATA:  Cough.  COPD. EXAM: PORTABLE CHEST 1 VIEW COMPARISON:  Chest radiograph dated 11/15/2020. FINDINGS: Background of emphysema. No focal consolidation, pleural effusion, or pneumothorax. Right perihilar atelectasis/scarring. The cardiac silhouette is within normal limits. No acute osseous pathology. IMPRESSION: 1. No active disease. 2. Emphysema. Electronically Signed   By: Elgie Collard M.D.   On: 08/05/2023 15:55     LOS: 2 days   Jeoffrey Massed, MD  [facility]    To contact the attending provider between 7A-7P or the covering provider during after hours 7P-7A, please log into the web site [email or web address] and access using universal [place] password for that web site. If you do not have the password, please call the hospital operator.  08/07/2023, 11:36 AM

## 2023-08-08 ENCOUNTER — Inpatient Hospital Stay (HOSPITAL_COMMUNITY): Payer: PPO

## 2023-08-08 DIAGNOSIS — G9340 Encephalopathy, unspecified: Secondary | ICD-10-CM | POA: Diagnosis not present

## 2023-08-08 DIAGNOSIS — J9621 Acute and chronic respiratory failure with hypoxia: Secondary | ICD-10-CM | POA: Diagnosis not present

## 2023-08-08 DIAGNOSIS — J441 Chronic obstructive pulmonary disease with (acute) exacerbation: Secondary | ICD-10-CM | POA: Diagnosis not present

## 2023-08-08 DIAGNOSIS — R0689 Other abnormalities of breathing: Secondary | ICD-10-CM | POA: Diagnosis not present

## 2023-08-08 DIAGNOSIS — J9622 Acute and chronic respiratory failure with hypercapnia: Secondary | ICD-10-CM | POA: Diagnosis not present

## 2023-08-08 DIAGNOSIS — J210 Acute bronchiolitis due to respiratory syncytial virus: Principal | ICD-10-CM

## 2023-08-08 DIAGNOSIS — E875 Hyperkalemia: Secondary | ICD-10-CM

## 2023-08-08 DIAGNOSIS — J449 Chronic obstructive pulmonary disease, unspecified: Secondary | ICD-10-CM | POA: Diagnosis not present

## 2023-08-08 DIAGNOSIS — R739 Hyperglycemia, unspecified: Secondary | ICD-10-CM

## 2023-08-08 DIAGNOSIS — N179 Acute kidney failure, unspecified: Secondary | ICD-10-CM | POA: Diagnosis not present

## 2023-08-08 LAB — BLOOD GAS, ARTERIAL
Acid-base deficit: 3.1 mmol/L — ABNORMAL HIGH (ref 0.0–2.0)
Acid-base deficit: 3.5 mmol/L — ABNORMAL HIGH (ref 0.0–2.0)
Bicarbonate: 28.6 mmol/L — ABNORMAL HIGH (ref 20.0–28.0)
Bicarbonate: 28.3 mmol/L — ABNORMAL HIGH (ref 20.0–28.0)
Drawn by: 24610
Drawn by: 24610
O2 Saturation: 100 %
O2 Saturation: 100 %
Patient temperature: 36.7
Patient temperature: 36.7
pCO2 arterial: 87 mm[Hg] (ref 32–48)
pCO2 arterial: 88 mm[Hg] (ref 32–48)
pH, Arterial: 7.12 — CL (ref 7.35–7.45)
pH, Arterial: 7.11 — CL (ref 7.35–7.45)
pO2, Arterial: 201 mm[Hg] — ABNORMAL HIGH (ref 83–108)
pO2, Arterial: 209 mm[Hg] — ABNORMAL HIGH (ref 83–108)

## 2023-08-08 LAB — BASIC METABOLIC PANEL
Anion gap: 7 (ref 5–15)
Anion gap: 12 (ref 5–15)
Anion gap: 12 (ref 5–15)
BUN: 94 mg/dL — ABNORMAL HIGH (ref 8–23)
BUN: 99 mg/dL — ABNORMAL HIGH (ref 8–23)
BUN: 106 mg/dL — ABNORMAL HIGH (ref 8–23)
CO2: 29 mmol/L (ref 22–32)
CO2: 24 mmol/L (ref 22–32)
CO2: 26 mmol/L (ref 22–32)
Calcium: 8.3 mg/dL — ABNORMAL LOW (ref 8.9–10.3)
Calcium: 8.4 mg/dL — ABNORMAL LOW (ref 8.9–10.3)
Calcium: 8.2 mg/dL — ABNORMAL LOW (ref 8.9–10.3)
Chloride: 102 mmol/L (ref 98–111)
Chloride: 101 mmol/L (ref 98–111)
Chloride: 101 mmol/L (ref 98–111)
Creatinine, Ser: 3.72 mg/dL — ABNORMAL HIGH (ref 0.61–1.24)
Creatinine, Ser: 4.09 mg/dL — ABNORMAL HIGH (ref 0.61–1.24)
Creatinine, Ser: 4.28 mg/dL — ABNORMAL HIGH (ref 0.61–1.24)
GFR, Estimated: 16 mL/min — ABNORMAL LOW (ref >60)
GFR, Estimated: 14 mL/min — ABNORMAL LOW (ref >60)
GFR, Estimated: 13 mL/min — ABNORMAL LOW (ref >60)
Glucose, Bld: 116 mg/dL — ABNORMAL HIGH (ref 70–99)
Glucose, Bld: 236 mg/dL — ABNORMAL HIGH (ref 70–99)
Glucose, Bld: 144 mg/dL — ABNORMAL HIGH (ref 70–99)
Potassium: 6.3 mmol/L (ref 3.5–5.1)
Potassium: 6.1 mmol/L — ABNORMAL HIGH (ref 3.5–5.1)
Potassium: 6.1 mmol/L — ABNORMAL HIGH (ref 3.5–5.1)
Sodium: 138 mmol/L (ref 135–145)
Sodium: 137 mmol/L (ref 135–145)
Sodium: 139 mmol/L (ref 135–145)

## 2023-08-08 LAB — GLUCOSE, CAPILLARY
Glucose-Capillary: 127 mg/dL — ABNORMAL HIGH (ref 70–99)
Glucose-Capillary: 140 mg/dL — ABNORMAL HIGH (ref 70–99)
Glucose-Capillary: 150 mg/dL — ABNORMAL HIGH (ref 70–99)
Glucose-Capillary: 162 mg/dL — ABNORMAL HIGH (ref 70–99)

## 2023-08-08 LAB — POTASSIUM
Potassium: 6.1 mmol/L — ABNORMAL HIGH (ref 3.5–5.1)
Potassium: 5.3 mmol/L — ABNORMAL HIGH (ref 3.5–5.1)

## 2023-08-08 LAB — MRSA NEXT GEN BY PCR, NASAL: MRSA by PCR Next Gen: NOT DETECTED

## 2023-08-08 MED ORDER — SODIUM CHLORIDE 0.9 % IV SOLN
250.0000 mL | INTRAVENOUS | Status: DC
  Administered 2023-08-08: 250 mL via INTRAVENOUS

## 2023-08-08 MED ORDER — SODIUM ZIRCONIUM CYCLOSILICATE 10 G PO PACK
10.0000 g | PACK | Freq: Two times a day (BID) | ORAL | Status: DC
  Administered 2023-08-08: 10 g via ORAL
  Filled 2023-08-08: qty 1

## 2023-08-08 MED ORDER — REVEFENACIN 175 MCG/3ML IN SOLN
175.0000 ug | Freq: Every day | RESPIRATORY_TRACT | Status: DC
Start: 2023-08-08 — End: 2023-08-09
  Administered 2023-08-08 – 2023-08-09 (×2): 175 ug via RESPIRATORY_TRACT
  Filled 2023-08-08 (×2): qty 3

## 2023-08-08 MED ORDER — SODIUM CHLORIDE 0.9 % IV BOLUS
1000.0000 mL | Freq: Once | INTRAVENOUS | Status: AC
  Administered 2023-08-08: 1000 mL via INTRAVENOUS

## 2023-08-08 MED ORDER — CALCIUM GLUCONATE-NACL 2-0.675 GM/100ML-% IV SOLN
2.0000 g | Freq: Once | INTRAVENOUS | Status: AC
  Administered 2023-08-08: 2000 mg via INTRAVENOUS
  Filled 2023-08-08: qty 100

## 2023-08-08 MED ORDER — DEXMEDETOMIDINE HCL IN NACL 400 MCG/100ML IV SOLN
0.0000 ug/kg/h | INTRAVENOUS | Status: DC
Start: 2023-08-08 — End: 2023-08-09
  Administered 2023-08-08: 0.4 ug/kg/h via INTRAVENOUS
  Administered 2023-08-09: 0.3 ug/kg/h via INTRAVENOUS
  Filled 2023-08-08 (×2): qty 100

## 2023-08-08 MED ORDER — DEXTROSE 50 % IV SOLN
1.0000 | Freq: Once | INTRAVENOUS | Status: AC
  Administered 2023-08-08: 50 mL via INTRAVENOUS
  Filled 2023-08-08: qty 50

## 2023-08-08 MED ORDER — NOREPINEPHRINE 4 MG/250ML-% IV SOLN
2.0000 ug/min | INTRAVENOUS | Status: DC
  Administered 2023-08-08: 2 ug/min via INTRAVENOUS
  Filled 2023-08-08: qty 250

## 2023-08-08 MED ORDER — SODIUM BICARBONATE 8.4 % IV SOLN
INTRAVENOUS | Status: DC
  Filled 2023-08-08: qty 1000
  Filled 2023-08-08: qty 150

## 2023-08-08 MED ORDER — IPRATROPIUM-ALBUTEROL 0.5-2.5 (3) MG/3ML IN SOLN: 3.0000 mL | RESPIRATORY_TRACT | Status: DC | PRN

## 2023-08-08 MED ORDER — ARFORMOTEROL TARTRATE 15 MCG/2ML IN NEBU
15.0000 ug | INHALATION_SOLUTION | Freq: Two times a day (BID) | RESPIRATORY_TRACT | Status: DC
  Administered 2023-08-08 (×2): 15 ug via RESPIRATORY_TRACT
  Filled 2023-08-08 (×2): qty 2

## 2023-08-08 MED ORDER — SODIUM ZIRCONIUM CYCLOSILICATE 10 G PO PACK: 10.0000 g | PACK | Freq: Once | ORAL | Status: DC

## 2023-08-08 MED ORDER — INSULIN ASPART 100 UNIT/ML IV SOLN
6.0000 [IU] | Freq: Once | INTRAVENOUS | Status: AC
  Administered 2023-08-08: 6 [IU] via INTRAVENOUS

## 2023-08-08 NOTE — Progress Notes (Signed)
Foley placed. Patient to be transported to [facility]. Will call report to RN. 500 bolus started.

## 2023-08-08 NOTE — Consult Note (Signed)
NAME:  Sean Pope, MRN:  [id number], DOB:  1945/07/08, LOS: 3 ADMISSION DATE:  08/05/2023, CONSULTATION DATE:  08/08/23  REFERRING MD:  Jerral Ralph , CHIEF COMPLAINT:  AMS/ resp failure    History of Present Illness:  78 yo M PMH COPD w chronic hypoxic failure on home O2, CKD IV, UC who was admitted to [facility]  08/05/23 after presenting w SOB, associated hypoxia at home.  Was tx for AECOPD. Found to have RSV infection, as well as AKI on CKD.   2/19 he had worse hypercarbia + associated AMS, and was on CPAP overnight. Transitioned to BiPAP 2/20 AM, but he pulled this off and subsequently desatted and became more altered.   PCCM consulted in this setting   Pertinent  Medical History  CKD IV COPD Chronic hypoxia UC  Tia HLD Chronic anemia Chronic neuropathic pain   Significant Hospital Events: Including procedures, antibiotic start and stop dates in addition to other pertinent events   2/17 admit to [facility]  2/18 diuretics held. Solumedrol, azithro. Worse renal fxn 2.88 2/19 worse renal fxn. Was less hypoxic but later more hypercarbic and altered. CPAP overnight.  2/20 CPAP changed to BiPAP. decompensated a bit after he pulled off his BiPAP. Worse renal fxn. PCCM consulted   Interim History / Subjective:  Back on bipap   Objective   Blood pressure 125/70, pulse 82, temperature 98 F (36.7 C), temperature source Oral, resp. rate (!) 22, SpO2 99%.    Vent Mode: BIPAP;PCV FiO2 (%):  [35 %-36 %] 35 % Set Rate:  [15 bmp] 15 bmp PEEP:  [5 cmH20] 5 cmH20 Pressure Support:  [8 cmH20] 8 cmH20 Plateau Pressure:  [4 cmH20] 4 cmH20  No intake or output data in the 24 hours ending 08/08/23 1118 There were no vitals filed for this visit.  Examination: General: chronically and acutely ill elderly M NAD  HENT: NCAT BiPAP in place with good seal  Lungs: Poor air movement  Cardiovascular: rr s1s2  Abdomen: soft round Extremities: no acute joint deformity  Neuro: Awakens to pain and can  nod/shake head to questions  GU: condom cath  Resolved Hospital Problem list     Assessment & Plan:   DNR/I AoC hypoxic and hypercarbic resp failure AECOPD Rsv infection  Acute encephalopathy - hypercarbia, hypoxia  AKI on CKD IV  Hyperkalemia  Hyperglycemia  HLD  UC  Neuropathic pain  P -transfer to ICU -will start some precedex to better facilitate bipap -NPO sips w meds  -add triple therapy -cont steroid, is on azithro for AECOPD.   -has received interventions already today for his hyperK, will recheck a BMP later this evening -cont bicarb gtt  -dc gabapentin for now  -lipitor  -think how we do in the next day or so will be telling. Worry about his declining renal fxn. Sounds like a decision has been reached for no HD.     Best Practice (right click and "Reselect all SmartList Selections" daily)   Diet/type: NPO DVT prophylaxis prophylactic heparin  Pressure ulcer(s): pressure ulcer assessment deferred  GI prophylaxis: N/A Lines: N/A Foley:  N/A Code Status:  DNR Last date of multidisciplinary goals of care discussion [2/10 -- updated daughter Gearldine Bienenstock on ICU transfer ]  Labs   CBC: Recent Labs  Lab 08/05/23 1510 08/05/23 1517 08/06/23 0308 08/06/23 0500  WBC 6.1  --  5.1 5.4  NEUTROABS 5.9  --   --   --   HGB 10.5* 11.2* 9.9* 8.6*  HCT 33.1* 33.0* 31.8* 28.1*  MCV 114.9*  --  113.6* 114.7*  PLT 258  --  250 209    Basic Metabolic Panel: Recent Labs  Lab 08/05/23 1510 08/05/23 1517 08/06/23 0308 08/06/23 0500 08/08/23 0452 08/08/23 0928  NA 145 143  --  140 138 137  K 4.4 4.3  --  4.4 6.3* 6.1*  CL 105  --   --  107 102 101  CO2 25  --   --  23 29 24   GLUCOSE 116*  --   --  176* 116* 236*  BUN 44*  --   --  52* 94* 99*  CREATININE 2.77*  --  3.12* 2.88* 3.72* 4.09*  CALCIUM 9.1  --   --  8.1* 8.3* 8.4*   GFR: CrCl cannot be calculated (Unknown ideal weight.). Recent Labs  Lab 08/05/23 1510 08/06/23 0308 08/06/23 0500  WBC 6.1 5.1 5.4     Liver Function Tests: Recent Labs  Lab 08/05/23 1510  AST 21  ALT 16  ALKPHOS 68  BILITOT 1.0  PROT 6.8  ALBUMIN 3.9   No results for input(s): "LIPASE", "AMYLASE" in the last 168 hours. No results for input(s): "AMMONIA" in the last 168 hours.  ABG    Component Value Date/Time   PHART 7.11 (LL) 08/08/2023 1043   PCO2ART 88 (HH) 08/08/2023 1043   PO2ART 209 (H) 08/08/2023 1043   HCO3 28.3 (H) 08/08/2023 1043   TCO2 29 08/05/2023 1517   ACIDBASEDEF 3.5 (H) 08/08/2023 1043   O2SAT 100 08/08/2023 1043     Coagulation Profile: No results for input(s): "INR", "PROTIME" in the last 168 hours.  Cardiac Enzymes: No results for input(s): "CKTOTAL", "CKMB", "CKMBINDEX", "TROPONINI" in the last 168 hours.  HbA1C: Hgb A1c MFr Bld  Date/Time Value Ref Range Status  05/20/2023 02:26 PM 5.9 4.6 - 6.5 % Final    Comment:    Glycemic Control Guidelines for People with Diabetes:Non Diabetic:  <6%Goal of Therapy: <7%Additional Action Suggested:  >8%   02/24/2023 02:24 AM 5.7 (H) 4.8 - 5.6 % Final    Comment:    (NOTE) Pre diabetes:          5.7%-6.4%  Diabetes:              >6.4%  Glycemic control for   <7.0% adults with diabetes     CBG: Recent Labs  Lab 08/05/23 2200 08/07/23 1623 08/08/23 1033  GLUCAP 218* 157* 150*    Review of Systems:   Unable to obtain due to degree of resp distress   Past Medical History:  He,  has a past medical history of Acute respiratory failure with hypoxia (HCC) (09/17/2011), Acute respiratory failure with hypoxia and hypercapnia (HCC) (11/15/2020), AKI (acute kidney injury) (HCC), Anal fissure - posterior (01/22/2014), Anemia-multifactorial (01/05/2014), ARF (acute renal failure) (HCC), B12 deficiency - borderline (12/21/2014), C. difficile colitis (06/02/2014), Chronic kidney disease, stage III (moderate) (HCC) (09/08/2013), Emphysema lung (HCC), GERD (gastroesophageal reflux disease), Hypercalcemia, Hyperlipidemia, Hypertension,  Latent tuberculosis, Long-term use of immunosuppressant medication-mercaptopurine (01/05/2014), Lung collapse (09/26/2011), Osteoporosis (06/29/2014), Pneumonia due to COVID-19 virus (11/15/2020), Posterior communicating artery aneurysm (09/17/2011), Protein-calorie malnutrition, severe (HCC) (10/26/2013), Respiratory failure, acute (HCC) (01/07/2014), SAH (subarachnoid hemorrhage) (HCC), Stroke (HCC), Ulcerative colitis (HCC) (05/28/2013), and Vitamin D deficiency (06/03/2014).   Surgical History:   Past Surgical History:  Procedure Laterality Date   ANEURYSM COILING  2013   Posterior communicating   BIOPSY  11/06/2022   Procedure: BIOPSY;  Surgeon: Leone Payor,  Maryjean Morn, MD;  Location: [facility];  Service: Gastroenterology;;   CATARACT EXTRACTION Bilateral aug, sept 2016   COLONOSCOPY W/ BIOPSIES  05/28/2013   COLONOSCOPY WITH PROPOFOL N/A 11/06/2022   Procedure: COLONOSCOPY WITH PROPOFOL;  Surgeon: Iva Boop, MD;  Location: [facility];  Service: Gastroenterology;  Laterality: N/A;   IR ANGIO INTRA EXTRACRAN SEL INTERNAL CAROTID BILAT MOD SED  10/22/2017   IR ANGIO VERTEBRAL SEL VERTEBRAL UNI L MOD SED  10/22/2017     Social History:   reports that he quit smoking about 12 years ago. His smoking use included cigarettes. He started smoking about 62 years ago. He has a 25 pack-year smoking history. He has quit using smokeless tobacco. He reports that he does not currently use alcohol. He reports that he does not use drugs.   Family History:  His family history includes Heart disease in his mother.   Allergies No Known Allergies   Home Medications  Prior to Admission medications   Medication Sig Start Date End Date Taking? Authorizing Provider  atorvastatin (LIPITOR) 40 MG tablet Take 1 tablet (40 mg total) by mouth daily. 02/24/23 02/24/24 Yes Glade Lloyd, MD  BREZTRI AEROSPHERE 160-9-4.8 MCG/ACT AERO INHALE 2 PUFFS INTO THE LUNGS IN THE MORNING AND AT BEDTIME. 02/06/23  Yes Mliss Sax, MD  chlorthalidone (HYGROTON) 25 MG tablet TAKE 1/2 TABLET BY MOUTH EVERY DAY 07/08/23  Yes Mliss Sax, MD  Cholecalciferol (VITAMIN D3) 50 MCG (2000 UT) TABS Take 50 Units by mouth daily.   Yes [provider]  Ferrous Sulfate (IRON) 325 (65 FE) MG TABS Take 65 mg by mouth 2 (two) times daily.   Yes [provider]  fluorouracil (EFUDEX) 5 % cream Apply topically daily. 12/24/22  Yes [provider]  furosemide (LASIX) 80 MG tablet Take 80 mg by mouth 2 (two) times daily. 01/07/23  Yes [provider]  gabapentin (NEURONTIN) 100 MG capsule Take 1 each morning and again in the mid afternoon and then 2 at night. Patient taking differently: Take 100 mg by mouth 2 (two) times daily. Take 1 each morning and again in the mid afternoon and then 2 at night. 02/19/23  Yes Mliss Sax, MD  guaiFENesin (MUCINEX) 600 MG 12 hr tablet Take 1 tablet (600 mg total) by mouth 2 (two) times daily as needed. 05/20/23  Yes Mliss Sax, MD  mercaptopurine (PURINETHOL) 50 MG tablet TAKE 1 AND 1/2 TABLETS BY MOUTH DAILY 02/25/23  Yes Iva Boop, MD  Multiple Vitamins-Minerals (MENS MULTIVITAMIN PLUS PO) Take 1 tablet by mouth. Centrum Silver   Yes [provider]  vitamin B-12 (CYANOCOBALAMIN) 1000 MCG tablet Take 1,000 mcg by mouth daily.   Yes [provider]  aspirin EC 81 MG tablet Take 1 tablet (81 mg total) by mouth daily. Swallow whole. Patient not taking: Reported on 08/05/2023 02/25/23   Glade Lloyd, MD     Critical care time: 56        CRITICAL CARE Performed by: Lanier Clam   Critical care time was exclusive of separately billable procedures and treating other patients. Critical care was necessary to treat or prevent imminent or life-threatening deterioration.  Critical care was time spent personally by me on the following activities: development of treatment plan with patient and/or surrogate as  well as nursing, discussions with consultants, evaluation of patient's response to treatment, examination of patient, obtaining history from patient or surrogate, ordering and performing treatments and interventions,  ordering and review of laboratory studies, ordering and review of radiographic studies, pulse oximetry and re-evaluation of patient's condition.  Tessie Fass MSN, AGACNP-BC [facility] Pulmonary/Critical Care Medicine Amion for pager  08/08/2023, 12:46 PM

## 2023-08-08 NOTE — Progress Notes (Signed)
Patient en route to ICU. Report was called earlier.

## 2023-08-08 NOTE — Progress Notes (Signed)
Called to transport pt to [facility]M ICU.  ICU RT report given.  Found resp rate set at 26, pt appears to be tol well.  Sat 100%, fio2 weaned to 80%, ICU RN aware.  No distress noted, pt currently appears comfortable.  BBSH very diminished t/o, CCM, ICU RN and ICU RT aware.

## 2023-08-08 NOTE — Progress Notes (Signed)
3M09 may have 3 family members visit overnight per Dr.Smith, CCM

## 2023-08-08 NOTE — Progress Notes (Signed)
Sodium bicarbonate started and infusing.

## 2023-08-08 NOTE — Progress Notes (Addendum)
Rec'd a call to come to bedside stat d/t reports of pt pulling bipap mask off and reported desat to 61%.  RN reports pt had pulled bipap circuit off and RN reports placing circuit immediately back on.  When I arrived, pt was on bipap, sat in mid 80's, increased FIO2 to 100% FIO2, sat increased to 100%.  BBSH remain diminished w/ slight exp wheeze- breathing treatment given.  Per Dr Jerral Ralph, ABG was drawn and sent to lab.  MD aware BP appears lower and decreased LOC but pt does awake to stimulation.  CCM came to bedside to assess pt.  Discussed that pt is currently not breating above set resp rate as he was earlier.  Awaiting ABG results, per disucssion w/ MD Dr Jerral Ralph and CCM PA will adjust set resp rate if needed s/p ABG results.

## 2023-08-08 NOTE — Progress Notes (Addendum)
Call from tele patient saturation in the 60s. Patient was found with mask on but his line from BIPAP removed. By this time SPO2 in at 55%. Line reconnected and saturation came back up. Respiratory to room. MD aware.

## 2023-08-08 NOTE — Plan of Care (Signed)
  Problem: Clinical Measurements: Goal: Ability to maintain clinical measurements within normal limits will improve Outcome: Not Progressing Goal: Respiratory complications will improve Outcome: Not Progressing   

## 2023-08-08 NOTE — Progress Notes (Addendum)
PROGRESS NOTE        PATIENT DETAILS Name: Sean Pope Age: 78 y.o. Sex: male Date of Birth: [birth year] Admit Date: 08/05/2023 Admitting Physician John Giovanni, MD ZOX:[id number], Talmadge Coventry, MD  Brief Summary: Patient is a 78 y.o.  male with history of COPD on home O2, ulcerative colitis, CKD stage IV-presented with  SOB- found to have acute on chronic hypoxic respiratory failure due to COPD exacerbation in a setting of RSV infection  Significant events: 2/17>> admitted [facility]-COPD exacerbation-RSV infection. 2/19>> developed hypercarbia in the evening- BiPAP started 2/20 >> although improved-still somewhat lethargic-BiPAP continued.  Unfortunately later in the day-patient pulled out his BiPAP-became hypoxic to the 60s-more unresponsive-ICU consult-transfer to the ICU for close management.  Significant studies: 2/17>> CXR: No PNA-Emphysema.  Significant microbiology data: 2/17>> RSV PCR: Positive 2/17>> COVID/influenza PCR: Negative  Procedures: None  Consults: PCCM  Subjective: Seen multiple times today-earlier this morning-sleepy but easily arousable-talking-following commands-moving all 4 extremities.  Subsequently later-after he pulled his BiPAP out-much harder to arouse-only will arouse briefly to painful stimuli.  Still moving all 4 extremities symmetrically.   Objective: Vitals: Blood pressure 125/70, pulse 82, temperature 98 F (36.7 C), temperature source Oral, resp. rate (!) 22, SpO2 99%.   Exam: Gen Exam Sleepy but awake/alert early this morning-much more somnolent later after he put his BiPAP. HEENT:atraumatic, normocephalic Chest: Diminished air entry bilaterally-no obvious rhonchi. CVS:S1S2 regular Abdomen:soft non tender, non distended Extremities:no edema Neurology: Difficult exam but seems to be moving all 4 extremities.  Speech was slow but clear this morning. Skin: no rash  Pertinent Labs/Radiology:    Latest Ref  Rng & Units 08/06/2023    5:00 AM 08/06/2023    3:08 AM 08/05/2023    3:17 PM  CBC  WBC 4.0 - 10.5 K/uL 5.4  5.1    Hemoglobin 13.0 - 17.0 g/dL 8.6  9.9  40.9   Hematocrit 39.0 - 52.0 % 28.1  31.8  33.0   Platelets 150 - 400 K/uL 209  250      Lab Results  Component Value Date   NA 137 08/08/2023   K 6.1 (H) 08/08/2023   CL 101 08/08/2023   CO2 24 08/08/2023      Assessment/Plan: Acute on chronic hypoxic and hypercarbic respiratory failure due to COPD exacerbation in a setting of RSV infection Developed hypercarbia last night-had improved this morning-but pulled out his BiPAP later-and became much more unresponsive-much more hypoxic.  BiPAP back in place-discussed with PCCM-he will need to be transferred to the ICU for closer monitoring. Continue scheduled bronchodilators Continue IV steroids for now Continue Zithromax Awaiting repeat chest x-ray. DNR confirmed with daughter-Brandy over the phone. Watch closely  Acute metabolic encephalopathy Secondary to hypercarbia/worsening hypoxemia Nonfocal exam Continue BiPAP-Continue to treat underlying etiologies.  AKI on CKD 4 Creatinine had improved-but much worse today Blood pressure soft Starting IV fluid boluses Attempted bladder scan-for some reason-not able to pick up bladder-given critically ill situation-will place Foley-to ensure that he is not retaining.  Voiding trial when he is a bit more improved  Hyperkalemia Unable to take any medications as of BiPAP-lethargic-will need Lokelma when he is a bit more awake Given IV insulin/dextrose ordered this morning Starting bicarb infusion to temporize hyperkalemia until he is off the BiPAP and able to take oral Lokelma. Repeat electrolytes later today.  Normocytic anemia  Likely related to CKD 4-no evidence of blood loss  HTN BP stable-soft this morning-see above-not on any antihypertensives currently.  HLD Resume Lipitor when oral intake permits.  Ulcerative  colitis 6-MP on hold given acute infection-somnolence.  Resume when able.  BMI: Estimated body mass index is 23.53 kg/m as calculated from the following:   Height as of 05/20/23: 5\' 10"  (1.778 m).   Weight as of 05/20/23: 74.4 kg.   The patient is critically ill with multiple organ system failure and requires high complexity decision making for assessment and support, frequent evaluation and titration of therapies, advanced monitoring, review of radiographic studies and interpretation of complex data.   Total Critical time spent equals 45 minutes  Code status:   Code Status: Limited: Do not attempt resuscitation (DNR) -DNR-LIMITED -Do Not Intubate/DNI    DVT Prophylaxis: heparin injection 5,000 Units Start: 08/05/23 2200   Family Communication:  Son at bedside this morning Daughter-Brandy-[phone] updated x 2 this morning.   Disposition Plan: Status is: Inpatient Remains inpatient appropriate because: Severity of illness   Planned Discharge Destination:Home   Diet: Diet Order             Diet Heart Room service appropriate? Yes; Fluid consistency: Thin  Diet effective now                     Antimicrobial agents: Anti-infectives (From admission, onward)    Start     Dose/Rate Route Frequency Ordered Stop   08/07/23 1000  azithromycin (ZITHROMAX) tablet 250 mg       Placed in "Followed by" Linked Group   250 mg Oral Daily 08/06/23 1331 08/11/23 0959   08/06/23 1345  azithromycin (ZITHROMAX) tablet 500 mg       Placed in "Followed by" Linked Group   500 mg Oral Daily 08/06/23 1331 08/06/23 1506        MEDICATIONS: Scheduled Meds:  atorvastatin  40 mg Oral Daily   azithromycin  250 mg Oral Daily   budesonide (PULMICORT) nebulizer solution  0.25 mg Nebulization BID   cholecalciferol  2,000 Units Oral Daily   cyanocobalamin  1,000 mcg Oral Daily   ferrous sulfate  325 mg Oral BID   gabapentin  100 mg Oral BID   heparin  5,000 Units Subcutaneous Q8H    ipratropium-albuterol  3 mL Nebulization Q6H   methylPREDNISolone (SOLU-MEDROL) injection  40 mg Intravenous Q12H   sodium zirconium cyclosilicate  10 g Oral BID   Continuous Infusions:  sodium bicarbonate 150 mEq in dextrose 5 % 1,150 mL infusion     sodium chloride     PRN Meds:.acetaminophen **OR** acetaminophen, albuterol, guaiFENesin   I have personally reviewed following labs and imaging studies  LABORATORY DATA: CBC: Recent Labs  Lab 08/05/23 1510 08/05/23 1517 08/06/23 0308 08/06/23 0500  WBC 6.1  --  5.1 5.4  NEUTROABS 5.9  --   --   --   HGB 10.5* 11.2* 9.9* 8.6*  HCT 33.1* 33.0* 31.8* 28.1*  MCV 114.9*  --  113.6* 114.7*  PLT 258  --  250 209    Basic Metabolic Panel: Recent Labs  Lab 08/05/23 1510 08/05/23 1517 08/06/23 0308 08/06/23 0500 08/08/23 0452 08/08/23 0928  NA 145 143  --  140 138 137  K 4.4 4.3  --  4.4 6.3* 6.1*  CL 105  --   --  107 102 101  CO2 25  --   --  23 29 24   GLUCOSE  116*  --   --  176* 116* 236*  BUN 44*  --   --  52* 94* 99*  CREATININE 2.77*  --  3.12* 2.88* 3.72* 4.09*  CALCIUM 9.1  --   --  8.1* 8.3* 8.4*    GFR: CrCl cannot be calculated (Unknown ideal weight.).  Liver Function Tests: Recent Labs  Lab 08/05/23 1510  AST 21  ALT 16  ALKPHOS 68  BILITOT 1.0  PROT 6.8  ALBUMIN 3.9   No results for input(s): "LIPASE", "AMYLASE" in the last 168 hours. No results for input(s): "AMMONIA" in the last 168 hours.  Coagulation Profile: No results for input(s): "INR", "PROTIME" in the last 168 hours.  Cardiac Enzymes: No results for input(s): "CKTOTAL", "CKMB", "CKMBINDEX", "TROPONINI" in the last 168 hours.  BNP (last 3 results) No results for input(s): "PROBNP" in the last 8760 hours.  Lipid Profile: No results for input(s): "CHOL", "HDL", "LDLCALC", "TRIG", "CHOLHDL", "LDLDIRECT" in the last 72 hours.  Thyroid Function Tests: No results for input(s): "TSH", "T4TOTAL", "FREET4", "T3FREE", "THYROIDAB" in the  last 72 hours.  Anemia Panel: Recent Labs    08/06/23 0500  VITAMINB12 2,651*  FOLATE >40.0  FERRITIN 277  TIBC 181*  IRON 39*  RETICCTPCT 0.9    Urine analysis:    Component Value Date/Time   COLORURINE YELLOW 01/08/2014 0120   APPEARANCEUR CLOUDY (A) 01/08/2014 0120   LABSPEC 1.020 01/08/2014 0120   PHURINE 5.5 01/08/2014 0120   GLUCOSEU NEGATIVE 01/08/2014 0120   GLUCOSEU NEGATIVE 10/20/2013 0855   HGBUR NEGATIVE 01/08/2014 0120   BILIRUBINUR NEGATIVE 01/08/2014 0120   KETONESUR NEGATIVE 01/08/2014 0120   PROTEINUR NEGATIVE 01/08/2014 0120   UROBILINOGEN 0.2 01/08/2014 0120   NITRITE NEGATIVE 01/08/2014 0120   LEUKOCYTESUR NEGATIVE 01/08/2014 0120    Sepsis Labs: Lactic Acid, Venous    Component Value Date/Time   LATICACIDVEN 0.7 11/15/2020 1931    MICROBIOLOGY: Recent Results (from the past 240 hours)  Resp panel by RT-PCR (RSV, Flu A&B, Covid) Anterior Nasal Swab     Status: Abnormal   Collection Time: 08/05/23  3:09 PM   Specimen: Anterior Nasal Swab  Result Value Ref Range Status   SARS Coronavirus 2 by RT PCR NEGATIVE NEGATIVE Final   Influenza A by PCR NEGATIVE NEGATIVE Final   Influenza B by PCR NEGATIVE NEGATIVE Final    Comment: (NOTE) The Xpert Xpress SARS-CoV-2/FLU/RSV plus assay is intended as an aid in the diagnosis of influenza from Nasopharyngeal swab specimens and should not be used as a sole basis for treatment. Nasal washings and aspirates are unacceptable for Xpert Xpress SARS-CoV-2/FLU/RSV testing.  Fact Sheet for Patients: BloggerCourse.com  Fact Sheet for Healthcare Providers: SeriousBroker.it  This test is not yet approved or cleared by the [place] FDA and has been authorized for detection and/or diagnosis of SARS-CoV-2 by FDA under an Emergency Use Authorization (EUA). This EUA will remain in effect (meaning this test can be used) for the duration of the COVID-19  declaration under Section 564(b)(1) of the Act, 21 U.S.C. section 360bbb-3(b)(1), unless the authorization is terminated or revoked.     Resp Syncytial Virus by PCR POSITIVE (A) NEGATIVE Final    Comment: (NOTE) Fact Sheet for Patients: BloggerCourse.com  Fact Sheet for Healthcare Providers: SeriousBroker.it  This test is not yet approved or cleared by the [place] FDA and has been authorized for detection and/or diagnosis of SARS-CoV-2 by FDA under an Emergency Use Authorization (EUA). This EUA will remain in  effect (meaning this test can be used) for the duration of the COVID-19 declaration under Section 564(b)(1) of the Act, 21 U.S.C. section 360bbb-3(b)(1), unless the authorization is terminated or revoked.  Performed at [facility], [address], [place], [place] [address]     RADIOLOGY STUDIES/RESULTS: No results found.    LOS: 3 days   Jeoffrey Massed, MD  [facility]    To contact the attending provider between 7A-7P or the covering provider during after hours 7P-7A, please log into the web site [email or web address] and access using universal [place] password for that web site. If you do not have the password, please call the hospital operator.  08/08/2023, 11:16 AM

## 2023-08-08 NOTE — Progress Notes (Signed)
PT Cancellation Note  Patient Details Name: Sean Pope MRN: [id number] DOB: Dec 24, 1945   Cancelled Treatment:    Reason Eval/Treat Not Completed: Patient not medically ready. Rapid response team called to bedside; pt removed bipap and desaturated into 50-60's. MD requesting therapy hold for today. Will attempt PT evaluation as pt becomes medically appropriate.    Marlana Salvage Zaunegger Blima Rich PT, DPT 08/08/2023, 11:26 AM

## 2023-08-08 NOTE — Progress Notes (Addendum)
Per disucssion w/ Dr Jerral Ralph s/p ABG results this morning, pt was switched from resmed to Servo BIPAP to better monitor tidal volume and minute volume.  Pt is awake and alert, currently appears to tolerate bipap well, no distress noted.  RN is aware.    Per discussion w/ MD, repeat ABG at noon.

## 2023-08-09 DIAGNOSIS — N179 Acute kidney failure, unspecified: Secondary | ICD-10-CM | POA: Diagnosis not present

## 2023-08-09 DIAGNOSIS — J9622 Acute and chronic respiratory failure with hypercapnia: Secondary | ICD-10-CM | POA: Diagnosis not present

## 2023-08-09 DIAGNOSIS — J9621 Acute and chronic respiratory failure with hypoxia: Secondary | ICD-10-CM | POA: Diagnosis not present

## 2023-08-09 DIAGNOSIS — J449 Chronic obstructive pulmonary disease, unspecified: Secondary | ICD-10-CM | POA: Diagnosis not present

## 2023-08-09 LAB — POCT I-STAT 7, (LYTES, BLD GAS, ICA,H+H)
Acid-Base Excess: 7 mmol/L — ABNORMAL HIGH (ref 0.0–2.0)
Bicarbonate: 32.8 mmol/L — ABNORMAL HIGH (ref 20.0–28.0)
Calcium, Ion: 1.12 mmol/L — ABNORMAL LOW (ref 1.15–1.40)
HCT: 23 % — ABNORMAL LOW (ref 39.0–52.0)
Hemoglobin: 7.8 g/dL — ABNORMAL LOW (ref 13.0–17.0)
O2 Saturation: 82 %
Potassium: 4.8 mmol/L (ref 3.5–5.1)
Sodium: 136 mmol/L (ref 135–145)
TCO2: 34 mmol/L — ABNORMAL HIGH (ref 22–32)
pCO2 arterial: 50.6 mm[Hg] — ABNORMAL HIGH (ref 32–48)
pH, Arterial: 7.419 (ref 7.35–7.45)
pO2, Arterial: 47 mm[Hg] — ABNORMAL LOW (ref 83–108)

## 2023-08-09 LAB — BASIC METABOLIC PANEL
Anion gap: 12 (ref 5–15)
Anion gap: 12 (ref 5–15)
BUN: 109 mg/dL — ABNORMAL HIGH (ref 8–23)
BUN: 107 mg/dL — ABNORMAL HIGH (ref 8–23)
CO2: 28 mmol/L (ref 22–32)
CO2: 28 mmol/L (ref 22–32)
Calcium: 8.1 mg/dL — ABNORMAL LOW (ref 8.9–10.3)
Calcium: 8.5 mg/dL — ABNORMAL LOW (ref 8.9–10.3)
Chloride: 97 mmol/L — ABNORMAL LOW (ref 98–111)
Chloride: 99 mmol/L (ref 98–111)
Creatinine, Ser: 4.12 mg/dL — ABNORMAL HIGH (ref 0.61–1.24)
Creatinine, Ser: 4.23 mg/dL — ABNORMAL HIGH (ref 0.61–1.24)
GFR, Estimated: 14 mL/min — ABNORMAL LOW (ref >60)
GFR, Estimated: 14 mL/min — ABNORMAL LOW (ref >60)
Glucose, Bld: 196 mg/dL — ABNORMAL HIGH (ref 70–99)
Glucose, Bld: 135 mg/dL — ABNORMAL HIGH (ref 70–99)
Potassium: 5 mmol/L (ref 3.5–5.1)
Potassium: 5.2 mmol/L — ABNORMAL HIGH (ref 3.5–5.1)
Sodium: 137 mmol/L (ref 135–145)
Sodium: 139 mmol/L (ref 135–145)

## 2023-08-09 LAB — CBC
HCT: 23.7 % — ABNORMAL LOW (ref 39.0–52.0)
Hemoglobin: 7.9 g/dL — ABNORMAL LOW (ref 13.0–17.0)
MCH: 36.2 pg — ABNORMAL HIGH (ref 26.0–34.0)
MCHC: 33.3 g/dL (ref 30.0–36.0)
MCV: 108.7 fL — ABNORMAL HIGH (ref 80.0–100.0)
Platelets: 186 10*3/uL (ref 150–400)
RBC: 2.18 MIL/uL — ABNORMAL LOW (ref 4.22–5.81)
RDW: 17.5 % — ABNORMAL HIGH (ref 11.5–15.5)
WBC: 7.7 10*3/uL (ref 4.0–10.5)
nRBC: 0.8 % — ABNORMAL HIGH (ref 0.0–0.2)

## 2023-08-09 LAB — POTASSIUM: Potassium: 5.3 mmol/L — ABNORMAL HIGH (ref 3.5–5.1)

## 2023-08-09 LAB — MAGNESIUM: Magnesium: 2.6 mg/dL — ABNORMAL HIGH (ref 1.7–2.4)

## 2023-08-09 LAB — GLUCOSE, CAPILLARY
Glucose-Capillary: 177 mg/dL — ABNORMAL HIGH (ref 70–99)
Glucose-Capillary: 150 mg/dL — ABNORMAL HIGH (ref 70–99)

## 2023-08-09 MED ORDER — HYDROMORPHONE BOLUS VIA INFUSION: 1.0000 mg | INTRAVENOUS | Status: DC | PRN

## 2023-08-09 MED ORDER — GLYCOPYRROLATE 0.2 MG/ML IJ SOLN: 0.2000 mg | INTRAMUSCULAR | Status: DC | PRN

## 2023-08-09 MED ORDER — GLYCOPYRROLATE 1 MG PO TABS: 1.0000 mg | ORAL_TABLET | ORAL | Status: DC | PRN

## 2023-08-09 MED ORDER — POLYVINYL ALCOHOL 1.4 % OP SOLN: 1.0000 [drp] | Freq: Four times a day (QID) | OPHTHALMIC | Status: DC | PRN

## 2023-08-09 MED ORDER — ACETAMINOPHEN 650 MG RE SUPP: 650.0000 mg | Freq: Four times a day (QID) | RECTAL | Status: DC | PRN

## 2023-08-09 MED ORDER — HYDROMORPHONE HCL-NACL 50-0.9 MG/50ML-% IV SOLN
0.0000 mg/h | INTRAVENOUS | Status: DC
Start: 2023-08-09 — End: 2023-08-09
  Administered 2023-08-09: 2 mg/h via INTRAVENOUS
  Filled 2023-08-09: qty 50

## 2023-08-09 MED ORDER — HYDROMORPHONE HCL 1 MG/ML IJ SOLN: 0.5000 mg | Freq: Once | INTRAMUSCULAR | Status: DC

## 2023-08-09 MED ORDER — SODIUM CHLORIDE 0.9 % IV SOLN: INTRAVENOUS | Status: DC

## 2023-08-09 MED ORDER — ACETAMINOPHEN 325 MG PO TABS: 650.0000 mg | ORAL_TABLET | Freq: Four times a day (QID) | ORAL | Status: DC | PRN

## 2023-08-17 NOTE — Progress Notes (Signed)
07/20/2023     I have seen and evaluated the patient for complex resp failure and shock.   S:  Low dose levo started. Minimal UOP. Confused Aox1 per RN Needing some precedex to prevent pulling off BIPAP.   O: Blood pressure 96/66, pulse 64, temperature 97.6 F (36.4 C), temperature source Oral, resp. rate 17, height 5\' 3"  (1.6 m), weight 81.6 kg, SpO2 94 %.    Resting comfortably Diminished breath sounds bilaterally, less wheezing and accessory muscle use 1+ edema RASS -1  Mixed gas with better gas exchange Awaiting BMP 300cc urine over last 24h   A:  AECOPD, RSV PNA Acute on chronic renal failure w/ hyperK: hyperK improved with bicarb and temporizing measures Septic/metabolic encephalopathy FTT since wife's passing   P:  - f/u on BMP then GOC discussion as patient would never have wanted HD - Precedex, bipap, levo in interim - suspect we may be at EOL   Total cc time 31 mins   Myrla Halsted MD [facility] Prefer epic messenger for cross cover needs If after hours, please call E-link

## 2023-08-17 NOTE — IPAL (Signed)
  Interdisciplinary Goals of Care Family Meeting   Date carried out: 08/13/2023  Location of the meeting: Bedside  Member's involved: Physician, Bedside Registered Nurse, and Family Member or next of kin  Durable Power of Attorney or acting medical decision maker: Daughter and son    Discussion: We discussed goals of care for Sean Pope .    Discussed progressive decline since wife's passing and their excellent care for him.  Discussed options going forward are to continue forcing BIPAP and hope for renal recovery or allow a peaceful passing.  While they would love [name] to get through this, they acknowledge that he is currently suffering and would not have wanted current level of support.  We will start a dilaudid drip until he is passively breathing with 20 BPM backup on NIV then take him off NIV with dilaudid pushes PRN for comfort.  Anticipate in hospital passing.  Condolences offered.  Code status:   Code Status: Do not attempt resuscitation (DNR) - Comfort care   Disposition: In-patient comfort care  Time spent for the meeting: 15 mins    Lorin Glass, MD  07/26/2023, 10:38 AM

## 2023-08-17 NOTE — Progress Notes (Signed)
RT took pt off Bipap to comfort care measures per MD with RN and family at bedside.

## 2023-08-17 NOTE — Progress Notes (Addendum)
Contacted e-link regarding hypotension. Spoke with CCM MD. Levophed ordered. Notified e-link of low urinary output. No new orders at this time. This RN will continue to monitor and assess the pt and notify of any changes.   Pt with sustained irregular heart rate and frequent pauses. MD made aware. No new orders at this time. This RN will continue to monitor and assess the pt and notify of any changes.

## 2023-08-17 NOTE — Progress Notes (Addendum)
Extra dilaudid drip 25cc wasted in the waste bin in med room, witnessed with Nauru Persons RN.

## 2023-08-17 NOTE — Progress Notes (Signed)
ABG attempted x 2, patient a difficult stick. Results of sample are mix venous/arterial. Dr. Katrinka Blazing, MD aware. No redraw necessary.

## 2023-08-17 NOTE — Death Summary Note (Signed)
 DEATH SUMMARY   Patient Details  Name: Sean Pope MRN: [id number] DOB: Nov 10, 1945  Admission/Discharge Information   Admit Date:  08-12-2023  Date of Death: Date of Death: 08-16-23  Time of Death: Time of Death: August 25, 1353  Length of Stay: 4  Referring Physician: Mliss Sax, MD    Diagnoses  Preliminary cause of death: acute on chronic renal failure x 7 days Secondary Diagnoses (including complications and co-morbidities):  RSV bronchiolitis COPD exacerbation Acute on chronic hypoxemic and hypercarbic respiratory failure Septic and metabolic encephalopathy FTT after wife's passing   Brief Hospital Course (including significant findings, care, treatment, and services provided and events leading to death)  78 yo M PMH COPD w chronic hypoxic failure on home O2, CKD IV, UC who was admitted to [facility]  2023/08/12 after presenting w SOB, associated hypoxia at home.  Was tx for AECOPD. Found to have RSV infection, as well as AKI on CKD.   2/19 he had worse hypercarbia + associated AMS, and was on CPAP overnight. Transitioned to BiPAP 2/20 AM, but he pulled this off and subsequently desatted and became more altered.    PCCM consulted in this setting   Patient given a night of aggressive care including sedation to facilitate BIPAP compliance, bicarb drip.  His kidneys did not recover and after discussion with family regarding patient's recent FTT after wife's passing (see IPAL note 08-16-23), son and daughter agreed to transition him to a peaceful passing with family at bedside.    Pertinent Labs and Studies  Significant Diagnostic Studies DG Chest Port 1V same Day Result Date: 08/08/2023 CLINICAL DATA:  Shortness of breath. EXAM: PORTABLE CHEST 1 VIEW COMPARISON:  Chest radiograph dated Aug 12, 2023. FINDINGS: Background of emphysema. Similar appearance of a right perihilar discoid density as the prior radiograph but new since 2020-08-25, likely atelectasis or scarring. Further evaluation  with CT is recommended. No focal consolidation, pleural effusion or pneumothorax. The cardiac silhouette is within normal limits. Atherosclerotic calcification of the aorta. No acute osseous pathology. IMPRESSION: 1. No acute cardiopulmonary process. 2. Right perihilar discoid density, likely atelectasis or scarring. Further evaluation with CT is recommended. Electronically Signed   By: Elgie Collard M.D.   On: 08/08/2023 11:28   DG Chest Port 1 View Result Date: 08/12/2023 CLINICAL DATA:  Cough.  COPD. EXAM: PORTABLE CHEST 1 VIEW COMPARISON:  Chest radiograph dated 11/15/2020. FINDINGS: Background of emphysema. No focal consolidation, pleural effusion, or pneumothorax. Right perihilar atelectasis/scarring. The cardiac silhouette is within normal limits. No acute osseous pathology. IMPRESSION: 1. No active disease. 2. Emphysema. Electronically Signed   By: Elgie Collard M.D.   On: 08/12/23 15:55    Microbiology Recent Results (from the past 240 hours)  Resp panel by RT-PCR (RSV, Flu A&B, Covid) Anterior Nasal Swab     Status: Abnormal   Collection Time: 12-Aug-2023  3:09 PM   Specimen: Anterior Nasal Swab  Result Value Ref Range Status   SARS Coronavirus 2 by RT PCR NEGATIVE NEGATIVE Final   Influenza A by PCR NEGATIVE NEGATIVE Final   Influenza B by PCR NEGATIVE NEGATIVE Final    Comment: (NOTE) The Xpert Xpress SARS-CoV-2/FLU/RSV plus assay is intended as an aid in the diagnosis of influenza from Nasopharyngeal swab specimens and should not be used as a sole basis for treatment. Nasal washings and aspirates are unacceptable for Xpert Xpress SARS-CoV-2/FLU/RSV testing.  Fact Sheet for Patients: BloggerCourse.com  Fact Sheet for Healthcare Providers: SeriousBroker.it  This test is not yet approved or  cleared by the [place] and has been authorized for detection and/or diagnosis of SARS-CoV-2 by FDA under an Emergency Use  Authorization (EUA). This EUA will remain in effect (meaning this test can be used) for the duration of the COVID-19 declaration under Section 564(b)(1) of the Act, 21 U.S.C. section 360bbb-3(b)(1), unless the authorization is terminated or revoked.     Resp Syncytial Virus by PCR POSITIVE (A) NEGATIVE Final    Comment: (NOTE) Fact Sheet for Patients: BloggerCourse.com  Fact Sheet for Healthcare Providers: SeriousBroker.it  This test is not yet approved or cleared by the [place] FDA and has been authorized for detection and/or diagnosis of SARS-CoV-2 by FDA under an Emergency Use Authorization (EUA). This EUA will remain in effect (meaning this test can be used) for the duration of the COVID-19 declaration under Section 564(b)(1) of the Act, 21 U.S.C. section 360bbb-3(b)(1), unless the authorization is terminated or revoked.  Performed at [facility], [address], [place], [place] [address]   MRSA Next Gen by PCR, Nasal     Status: None   Collection Time: 08/08/23  3:57 PM   Specimen: Nasal Mucosa; Nasal Swab  Result Value Ref Range Status   MRSA by PCR Next Gen NOT DETECTED NOT DETECTED Final    Comment: (NOTE) The GeneXpert MRSA Assay (FDA approved for NASAL specimens only), is one component of a comprehensive MRSA colonization surveillance program. It is not intended to diagnose MRSA infection nor to guide or monitor treatment for MRSA infections. Test performance is not FDA approved in patients less than 78 years old. Performed at [facility], [address], [place], [place] [address]     Lab Basic Metabolic Panel: Recent Labs  Lab 08/08/23 [id number] 08/08/23 [phone] 08/08/23 1155 08/08/23 1700 08/08/23 2202 08/13/2023 0055 08/12/2023 0414 08/06/2023 0649 08/03/2023 0850  NA 138 137  --  139  --   --  137 136 139  K 6.3* 6.1*   < > 6.1* 5.3* 5.3* 5.0 4.8 5.2*  CL 102 101  --  101  --   --  97*  --   99  CO2 29 24  --  26  --   --  28  --  28  GLUCOSE 116* 236*  --  144*  --   --  196*  --  135*  BUN 94* 99*  --  106*  --   --  109*  --  107*  CREATININE 3.72* 4.09*  --  4.28*  --   --  4.12*  --  4.23*  CALCIUM 8.3* 8.4*  --  8.2*  --   --  8.1*  --  8.5*  MG  --   --   --   --   --   --  2.6*  --   --    < > = values in this interval not displayed.   Liver Function Tests: No results for input(s): "AST", "ALT", "ALKPHOS", "BILITOT", "PROT", "ALBUMIN" in the last 168 hours. No results for input(s): "LIPASE", "AMYLASE" in the last 168 hours. No results for input(s): "AMMONIA" in the last 168 hours. CBC: Recent Labs  Lab 08/03/2023 0414 07/29/2023 0649  WBC 7.7  --   HGB 7.9* 7.8*  HCT 23.7* 23.0*  MCV 108.7*  --   PLT 186  --    Cardiac Enzymes: No results for input(s): "CKTOTAL", "CKMB", "CKMBINDEX", "TROPONINI" in the last 168 hours. Sepsis Labs: Recent Labs  Lab 08/04/2023 0414  WBC 7.7    Lorin Glass 08/13/2023, 3:55 PM

## 2023-08-17 NOTE — Progress Notes (Signed)
Noted Pt's HR 0, asystole on the monitor, pt assessed with Erenest Blank, no palpable beat, No BP, no spontaneous respirations, pupils fixed, family at bedside, pt pronounced at 1355.

## 2023-08-17 DEATH — deceased

## 2023-10-02 ENCOUNTER — Ambulatory Visit: Payer: PPO | Admitting: Internal Medicine

## 2023-10-09 ENCOUNTER — Ambulatory Visit: Payer: PPO | Admitting: Internal Medicine
